# Patient Record
Sex: Female | Born: 1960 | Race: White | Hispanic: No | Marital: Married | Smoking: Never smoker
Health system: Southern US, Community
[De-identification: ages and names within clinical notes are randomized; demographics above are authoritative.]

## PROBLEM LIST (undated history)

## (undated) DIAGNOSIS — J302 Other seasonal allergic rhinitis: Secondary | ICD-10-CM

## (undated) DIAGNOSIS — N951 Menopausal and female climacteric states: Secondary | ICD-10-CM

## (undated) DIAGNOSIS — K219 Gastro-esophageal reflux disease without esophagitis: Secondary | ICD-10-CM

## (undated) DIAGNOSIS — C549 Malignant neoplasm of corpus uteri, unspecified: Secondary | ICD-10-CM

## (undated) DIAGNOSIS — G43909 Migraine, unspecified, not intractable, without status migrainosus: Secondary | ICD-10-CM

## (undated) DIAGNOSIS — I10 Essential (primary) hypertension: Secondary | ICD-10-CM

## (undated) DIAGNOSIS — M1990 Unspecified osteoarthritis, unspecified site: Secondary | ICD-10-CM

## (undated) DIAGNOSIS — D649 Anemia, unspecified: Secondary | ICD-10-CM

## (undated) HISTORY — DX: Menopausal and female climacteric states: N95.1

## (undated) HISTORY — PX: BREAST BIOPSY: SHX20

## (undated) HISTORY — DX: Migraine, unspecified, not intractable, without status migrainosus: G43.909

## (undated) HISTORY — DX: Gastro-esophageal reflux disease without esophagitis: K21.9

## (undated) HISTORY — PX: ABDOMINAL HYSTERECTOMY: SHX81

---
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
## 2010-07-15 ENCOUNTER — Encounter (INDEPENDENT_AMBULATORY_CARE_PROVIDER_SITE_OTHER): Payer: Self-pay | Admitting: *Deleted

## 2010-12-16 NOTE — Letter (Signed)
Summary: Unable to Reach, Consult Scheduled  [facility]  [address]   [place], [place] [address]   Phone: [phone]  Fax: [phone]    07/15/2010  Leah Diaz [address] [place], [place]  [address] March 05, 1961   Dear [name],      At the recommendation of DR Dimas Aguas  we have been asked to schedule you a consult with DR FIELDS for DYSPHAGIA.   Please call our office at [phone].     Thank you,    Diana Eves  [facility] R. Roetta Sessions, M.D.    Jonette Eva, M.D. Lorenza Burton, FNP-BC    Tana Coast, PA-C Phone: [phone]    Fax: [phone]

## 2011-05-05 DIAGNOSIS — N951 Menopausal and female climacteric states: Secondary | ICD-10-CM

## 2011-05-05 DIAGNOSIS — G43909 Migraine, unspecified, not intractable, without status migrainosus: Secondary | ICD-10-CM

## 2011-05-05 HISTORY — DX: Menopausal and female climacteric states: N95.1

## 2011-05-05 HISTORY — DX: Migraine, unspecified, not intractable, without status migrainosus: G43.909

## 2011-05-05 HISTORY — PX: TUBAL LIGATION: SHX77

## 2011-06-03 ENCOUNTER — Encounter (INDEPENDENT_AMBULATORY_CARE_PROVIDER_SITE_OTHER): Payer: Self-pay

## 2011-06-30 ENCOUNTER — Ambulatory Visit (INDEPENDENT_AMBULATORY_CARE_PROVIDER_SITE_OTHER): Payer: Self-pay | Admitting: Internal Medicine

## 2011-08-04 ENCOUNTER — Ambulatory Visit (INDEPENDENT_AMBULATORY_CARE_PROVIDER_SITE_OTHER): Payer: Self-pay | Admitting: Internal Medicine

## 2011-08-05 ENCOUNTER — Encounter (INDEPENDENT_AMBULATORY_CARE_PROVIDER_SITE_OTHER): Payer: Self-pay | Admitting: Internal Medicine

## 2011-08-05 ENCOUNTER — Telehealth (INDEPENDENT_AMBULATORY_CARE_PROVIDER_SITE_OTHER): Payer: Self-pay | Admitting: *Deleted

## 2011-08-05 ENCOUNTER — Ambulatory Visit (INDEPENDENT_AMBULATORY_CARE_PROVIDER_SITE_OTHER): Payer: BC Managed Care – PPO | Admitting: Internal Medicine

## 2011-08-05 VITALS — BP 96/54 | HR 56 | Ht <= 58 in | Wt 185.4 lb

## 2011-08-05 DIAGNOSIS — Z1211 Encounter for screening for malignant neoplasm of colon: Secondary | ICD-10-CM

## 2011-08-05 DIAGNOSIS — R1319 Other dysphagia: Secondary | ICD-10-CM

## 2011-08-05 DIAGNOSIS — R1310 Dysphagia, unspecified: Secondary | ICD-10-CM

## 2011-08-05 DIAGNOSIS — R1314 Dysphagia, pharyngoesophageal phase: Secondary | ICD-10-CM

## 2011-08-05 MED ORDER — PEG-KCL-NACL-NASULF-NA ASC-C 100 G PO SOLR: 1.0000 | Freq: Once | ORAL | Status: DC

## 2011-08-05 NOTE — Progress Notes (Signed)
Subjective:     Patient ID: Leah Diaz, female   DOB: 1961/01/04, 50 y.o.   MRN: [id number]  HPI  Leah Diaz is a 50 yr female referred to our office by Dr. Mora Appl. She c/o dysphagia.  She says chicken is lodging in her esophagus.  When it lodges, it feels like it will bubbling in her esophagus.   She will drink water till the bolus goes down.  Sometimes she will vomit the bolus up.  No symptoms for about a month.  Dysphagia started about 6-9 months.  It feels like it is lodging in her upper esophagus. Appetite is good. No weight loss. No abdominal pain.  She has a BM one a day. She will see blood when she wipes if her hemorrhoids flare up. Her acid reflux is controlled with Prevacid.  If she doesn't take the Prevacid it feels like something is sitting on her chest.    She also is due for a screening colonoscopy Review of Systems  See hpi Current Outpatient Prescriptions  Medication Sig Dispense Refill  . butalbital-aspirin-caffeine-codeine (FIORINAL WITH CODEINE) 50-325-40-30 MG capsule Take 1 capsule by mouth every 4 (four) hours as needed.        . cetirizine (ZYRTEC) 10 MG chewable tablet Chew 10 mg by mouth daily.        . lansoprazole (PREVACID) 15 MG capsule Take 15 mg by mouth daily.        Marland Kitchen topiramate (TOPAMAX) 100 MG tablet Take 100 mg by mouth 2 (two) times daily.         Past Medical History  Diagnosis Date  . GERD (gastroesophageal reflux disease)   . Migraines 05/05/2011  . Menopausal symptoms 05/05/2011  . Multiple allergies    Past Surgical History  Procedure Date  . Tubal ligation 05/05/2011  . Breast biopsy     x 2 benign   History   Social History  . Marital Status: Unknown    Spouse Name: N/A    Number of Children: N/A  . Years of Education: N/A   Occupational History  . Not on file.   Social History Main Topics  . Smoking status: Never Smoker   . Smokeless tobacco: Not on file  . Alcohol Use: No  . Drug Use: No  . Sexually Active: Not on file   Other  Topics Concern  . Not on file   Social History Narrative  . No narrative on file   Family Status  Relation Status Death Age  . Mother Alive     good health  . Father Alive     good health  . Child Alive     good health. 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 Skin warm and dry. Oral mucosa is moist. Natural teeth in good condition. Sclera anicteric, conjunctivae is pink. Thyroid not enlarged. No cervical lymphadenopathy. Lungs clear. Heart regular rate and rhythm.  Abdomen is soft. Bowel sounds are positive. No hepatomegaly. No abdominal masses felt. No tenderness.  No edema to lower extremities. Patient is alert and oriented.     Assessment:    dysphagia, Screening colonoscopy.  Esophageal stricture or web need to be ruled out..  Barrett's  esophagus also in the differential given her hx of acid reflux.    Plan:    EGD/ED/ screening colonoscopy.The risks and benefits such as perforation, bleeding, and infection were reviewed with the patient and is agreeable.

## 2011-08-05 NOTE — Telephone Encounter (Signed)
TCS/EGD/ED sch'd 09/17/11 @ 1:00 (12:00), osmo pill prep instructions given

## 2011-09-03 ENCOUNTER — Other Ambulatory Visit (INDEPENDENT_AMBULATORY_CARE_PROVIDER_SITE_OTHER): Payer: Self-pay | Admitting: *Deleted

## 2011-09-03 DIAGNOSIS — Z1211 Encounter for screening for malignant neoplasm of colon: Secondary | ICD-10-CM

## 2011-09-16 MED ORDER — SODIUM CHLORIDE 0.45 % IV SOLN
Freq: Once | INTRAVENOUS | Status: AC
  Administered 2011-09-17: 12:00:00 via INTRAVENOUS

## 2011-09-17 ENCOUNTER — Encounter (HOSPITAL_COMMUNITY): Payer: Self-pay | Admitting: *Deleted

## 2011-09-17 ENCOUNTER — Other Ambulatory Visit (INDEPENDENT_AMBULATORY_CARE_PROVIDER_SITE_OTHER): Payer: Self-pay | Admitting: Internal Medicine

## 2011-09-17 ENCOUNTER — Encounter (HOSPITAL_COMMUNITY)
Admission: RE | Disposition: A | Discharge status: Home / Self Care | Payer: Self-pay | Source: Ambulatory Visit | Attending: Internal Medicine

## 2011-09-17 ENCOUNTER — Ambulatory Visit (HOSPITAL_COMMUNITY)
Admission: RE | Admit: 2011-09-17 | Discharge: 2011-09-17 | Disposition: A | Discharge status: Home / Self Care | Payer: BC Managed Care – PPO | Source: Ambulatory Visit | Attending: Internal Medicine | Admitting: Internal Medicine

## 2011-09-17 DIAGNOSIS — Z1211 Encounter for screening for malignant neoplasm of colon: Secondary | ICD-10-CM

## 2011-09-17 DIAGNOSIS — K6389 Other specified diseases of intestine: Secondary | ICD-10-CM

## 2011-09-17 DIAGNOSIS — K2980 Duodenitis without bleeding: Secondary | ICD-10-CM

## 2011-09-17 DIAGNOSIS — R1310 Dysphagia, unspecified: Secondary | ICD-10-CM | POA: Insufficient documentation

## 2011-09-17 DIAGNOSIS — D126 Benign neoplasm of colon, unspecified: Secondary | ICD-10-CM | POA: Insufficient documentation

## 2011-09-17 DIAGNOSIS — K222 Esophageal obstruction: Secondary | ICD-10-CM | POA: Insufficient documentation

## 2011-09-17 DIAGNOSIS — R1312 Dysphagia, oropharyngeal phase: Secondary | ICD-10-CM

## 2011-09-17 DIAGNOSIS — K644 Residual hemorrhoidal skin tags: Secondary | ICD-10-CM | POA: Insufficient documentation

## 2011-09-17 DIAGNOSIS — K219 Gastro-esophageal reflux disease without esophagitis: Secondary | ICD-10-CM

## 2011-09-17 HISTORY — DX: Other seasonal allergic rhinitis: J30.2

## 2011-09-17 SURGERY — COLONOSCOPY WITH ESOPHAGOGASTRODUODENOSCOPY (EGD)
Anesthesia: Moderate Sedation

## 2011-09-17 MED ORDER — BUTAMBEN-TETRACAINE-BENZOCAINE 2-2-14 % EX AERO
INHALATION_SPRAY | CUTANEOUS | Status: DC | PRN
  Administered 2011-09-17: 2 via TOPICAL

## 2011-09-17 MED ORDER — MIDAZOLAM HCL 5 MG/5ML IJ SOLN
INTRAMUSCULAR | Status: DC | PRN
  Administered 2011-09-17 (×2): 2 mg via INTRAVENOUS
  Administered 2011-09-17 (×3): 1 mg via INTRAVENOUS
  Administered 2011-09-17: 2 mg via INTRAVENOUS

## 2011-09-17 MED ORDER — MEPERIDINE HCL 50 MG/ML IJ SOLN
INTRAMUSCULAR | Status: DC | PRN
  Administered 2011-09-17 (×2): 25 mg via INTRAVENOUS

## 2011-09-17 MED ORDER — MIDAZOLAM HCL 5 MG/5ML IJ SOLN
INTRAMUSCULAR | Status: AC
  Filled 2011-09-17: qty 10

## 2011-09-17 MED ORDER — STERILE WATER FOR IRRIGATION IR SOLN
Status: DC | PRN
  Administered 2011-09-17: 13:00:00

## 2011-09-17 MED ORDER — MEPERIDINE HCL 50 MG/ML IJ SOLN
INTRAMUSCULAR | Status: AC
  Filled 2011-09-17: qty 1

## 2011-09-17 NOTE — Op Note (Signed)
EGD AND COLONOSCOPY  PROCEDURE REPORT  PATIENT:  Leah Diaz  MR#:  [id number] Birthdate:  [birth year], 50 y.o., female Endoscopist:  Dr. Malissa Hippo, MD Referred By:  Dr. Gillie Manners. Dimas Aguas, M.D.  Procedure Date: 09/17/2011  Procedure:   EGD,  ED & Colonoscopy  Indications:  Patient is 50 year old Caucasian female with chronic GERD was maintained on PPI who presents with intermittent solid food dysphagia of 8-9 months duration. She is also undergoing screening colonoscopy.             Informed Consent: Procedures and risks were reviewed with the patient and informed consent was obtained. Medications:  Demerol 50 mg IV Versed 10 mg IV Cetacaine spray topically for oropharyngeal anesthesia  EGD  Description of procedure:  The endoscope was introduced through the mouth and advanced to the second portion of the duodenum without difficulty or limitations. The mucosal surfaces were surveyed very carefully during advancement of the scope and upon withdrawal.  Findings:  Esophagus:  Mucosa of the proximal middle and distal third was normal. There was noncritical narrowing at GE junction with edema and nodularity to mucosa. This area was biopsied as reviewed below. GEJ:  36 cm Hiatus:  38 cm Stomach:  Stomach was empty and distended very well with insufflation. Folds in the proximal stomach were normal. Examination of mucosa at body, antrum, pyloric channel as well as angularis fundus and cardia was normal. Duodenum:  Patchy edema and erythema to bulbar mucosa. Post bulbar mucosa was normal.  Therapeutic/Diagnostic Maneuvers Performed:  Esophageal dilation was performed by passing 54 French Maloney dilator to full insertion. Esophageal mucosa was reexamined post dilation and there was mucosal disruption at GE junction. Biopsy was taken from this edematous nodular mucosa at GE junction before the endoscope was withdrawn.  COLONOSCOPY Description of procedure:  After a digital rectal exam  was performed, that colonoscope was advanced from the anus through the rectum and colon to the area of the cecum, ileocecal valve and appendiceal orifice. The cecum was deeply intubated. These structures were well-seen and photographed for the record. From the level of the cecum and ileocecal valve, the scope was slowly and cautiously withdrawn. The mucosal surfaces were carefully surveyed utilizing scope tip to flexion to facilitate fold flattening as needed. The scope was pulled down into the rectum where a thorough exam including retroflexion was performed. Is note patient was turned on her right side and better views of appendiceal orifice and cecum were taken but these pictures were lost.  Findings:   Prep excellent. 5 mm polyp at hepatic flexure ablated via cold biopsy. 2- 3 mm polyp at proximal transverse colon ablated via cold biopsy. 3 anal papilla and hemorrhoids below the dentate line.   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 Small sliding hiatal hernia. Bulbar duodenitis Esophagus dilated by passing 54 [place] Maloney dilator. Biopsy  taken from mucosa at GE junction post dilation. 2 small polyps ablated via cold biopsy one from hepatic flexure and the second one from transverse colon. External hemorrhoids and three small anal papillae.    Recommendations:  Standard instructions given. We will check her H. pylori serology today. I will be contacting patient with results of biopsy and blood test.  Theresa Wedel U  09/17/2011 2:05 PM  CC: Dr. Criss Rosales, MD & Dr. Bonnetta Barry ref. provider found

## 2011-09-17 NOTE — H&P (Signed)
Leah Diaz is an 50 y.o. female.   Chief Complaint: Patient is in for EGD, ED and colonoscopy. HPI: Patient is 50 year old Caucasian female was chronic GERD and maintained on PPI was been experiencing intermittent solid food dysphagia for the last 8-9 months. She has most difficulty with chicken. She points to upper sternal/suprasternal area site of bolus obstruction.Marland Kitchen occasionally she has to regurgitate food bolus to get relief. He has good appetite she denies abdominal pain melena or rectal bleeding. She is also undergoing screening colonoscopy. Him history is positive for colorectal carcinoma in maternal uncle who died at age 86.  Past Medical History  Diagnosis Date  . GERD (gastroesophageal reflux disease)   . Migraines 05/05/2011  . Menopausal symptoms 05/05/2011  . Asthma     remote history as a teenager  . Seasonal allergies     Past Surgical History  Procedure Date  . Tubal ligation 05/05/2011  . Breast biopsy     x 2 benign    Family History  Problem Relation Age of Onset  . Colon cancer Maternal Uncle    Social History:  reports that she has never smoked. She does not have any smokeless tobacco history on file. She reports that she does not drink alcohol or use illicit drugs.  Allergies: No Known Allergies  Medications Prior to Admission  Medication Dose Route Frequency Provider Last Rate Last Dose  . 0.45 % sodium chloride infusion   Intravenous Once Malissa Hippo, MD 20 mL/hr at 09/17/11 1212    . meperidine (DEMEROL) 50 MG/ML injection           . midazolam (VERSED) 5 MG/5ML injection            Medications Prior to Admission  Medication Sig Dispense Refill  . butalbital-aspirin-caffeine-codeine (FIORINAL WITH CODEINE) 50-325-40-30 MG capsule Take 1 capsule by mouth every 4 (four) hours as needed. migraines      . cetirizine (ZYRTEC) 10 MG chewable tablet Chew 10 mg by mouth daily. allergies      . lansoprazole (PREVACID) 15 MG capsule Take 15 mg by mouth  daily.        Marland Kitchen topiramate (TOPAMAX) 100 MG tablet Take 100 mg by mouth daily.         No results found for this or any previous visit (from the past 48 hour(s)). No results found.  Review of Systems  Constitutional: Negative for weight loss.  Gastrointestinal: Negative for vomiting, abdominal pain, diarrhea, constipation, blood in stool and melena. Heartburn: heartburn only if he misses her medication.    Blood pressure 148/97, pulse 75, temperature 98.1 F (36.7 C), temperature source Oral, resp. rate 16, height 5\' 8"  (1.727 m), weight 180 lb (81.647 kg), SpO2 100.00%. Physical Exam  Constitutional: She appears well-developed and well-nourished.  HENT:  Mouth/Throat: Oropharynx is clear and moist.  Eyes: Conjunctivae are normal. No scleral icterus.  Neck: No thyromegaly present.  Cardiovascular: Normal rate, regular rhythm and normal heart sounds.   Respiratory: Effort normal and breath sounds normal.  GI: Soft. She exhibits no distension. There is no tenderness.  Musculoskeletal: She exhibits no edema.  Lymphadenopathy:    She has no cervical adenopathy.  Neurological: She is alert.  Skin: Skin is warm and dry.     Assessment/Plan Chronic GERD. Solid food dysphagia EGD possible ED and screening colonoscopy Johnae Friley U 09/17/2011, 1:09 PM

## 2011-09-23 ENCOUNTER — Telehealth (INDEPENDENT_AMBULATORY_CARE_PROVIDER_SITE_OTHER): Payer: Self-pay | Admitting: Internal Medicine

## 2011-09-23 NOTE — Telephone Encounter (Signed)
Would like to know her biopsy results. Please return the call to [phone].

## 2011-09-25 ENCOUNTER — Encounter (INDEPENDENT_AMBULATORY_CARE_PROVIDER_SITE_OTHER): Payer: Self-pay | Admitting: *Deleted

## 2011-09-25 NOTE — Telephone Encounter (Signed)
Patient was called yesterday and nontender with the results with her husband

## 2011-09-25 NOTE — Telephone Encounter (Signed)
Forwarded to Dr.Rehman. 

## 2011-09-26 NOTE — Telephone Encounter (Signed)
I went over the results with patient; she tells me that husband has not told her anything about the results Whim 2 days ago. which I reviewed with him 2 days ago. HP serology cancelled;not sure why; Tammy please find out and order one;

## 2011-09-28 ENCOUNTER — Telehealth (INDEPENDENT_AMBULATORY_CARE_PROVIDER_SITE_OTHER): Payer: Self-pay | Admitting: *Deleted

## 2011-09-28 DIAGNOSIS — R1013 Epigastric pain: Secondary | ICD-10-CM

## 2011-09-28 NOTE — Telephone Encounter (Signed)
Previus order was canceled,per Dr. Karilyn Cota reorder. Order faxed to [facility]

## 2011-09-29 ENCOUNTER — Telehealth (INDEPENDENT_AMBULATORY_CARE_PROVIDER_SITE_OTHER): Payer: Self-pay | Admitting: Internal Medicine

## 2011-09-29 NOTE — Telephone Encounter (Signed)
This is for Dr. Rehman 

## 2011-09-29 NOTE — Telephone Encounter (Signed)
Would like to get her blood test results. Please return the call to [phone].

## 2011-09-30 NOTE — Telephone Encounter (Signed)
H-Pylori ordered and faxed to [facility]. Patient called on home line and given this information.

## 2011-10-01 NOTE — Telephone Encounter (Signed)
Patient was called yesterday and a message was left that a lab order had been faxed to [facility] for the H-Pylori to be done.

## 2011-10-01 NOTE — Telephone Encounter (Signed)
She never had H. pylori serology as ordered. I do not know the reason why it was canceled. She will have this test done when she can

## 2011-10-03 ENCOUNTER — Other Ambulatory Visit (INDEPENDENT_AMBULATORY_CARE_PROVIDER_SITE_OTHER): Payer: Self-pay | Admitting: Internal Medicine

## 2011-10-05 ENCOUNTER — Other Ambulatory Visit (INDEPENDENT_AMBULATORY_CARE_PROVIDER_SITE_OTHER): Payer: Self-pay | Admitting: Internal Medicine

## 2011-10-05 DIAGNOSIS — B9681 Helicobacter pylori [H. pylori] as the cause of diseases classified elsewhere: Secondary | ICD-10-CM

## 2011-10-05 MED ORDER — BIS SUBCIT-METRONID-TETRACYC 140-125-125 MG PO CAPS: 3.0000 | ORAL_CAPSULE | Freq: Three times a day (TID) | ORAL | Status: AC

## 2011-10-06 ENCOUNTER — Telehealth (INDEPENDENT_AMBULATORY_CARE_PROVIDER_SITE_OTHER): Payer: Self-pay | Admitting: Internal Medicine

## 2011-10-06 DIAGNOSIS — B9681 Helicobacter pylori [H. pylori] as the cause of diseases classified elsewhere: Secondary | ICD-10-CM

## 2011-10-06 NOTE — Telephone Encounter (Signed)
Patient went to pick up the medication Dr. Karilyn Cota wanted her to start taking. She can not remember the name due to the price. The medication is going to cost $637. [employer] company will not cover this. Is there anything cheaper the medication could be replaced with? There return phone number is [phone].

## 2011-10-07 MED ORDER — AMOXICILLIN 500 MG PO CAPS: 1000.0000 mg | ORAL_CAPSULE | Freq: Two times a day (BID) | ORAL | Status: AC

## 2011-10-07 MED ORDER — OMEPRAZOLE 20 MG PO CPDR: 20.0000 mg | DELAYED_RELEASE_CAPSULE | Freq: Two times a day (BID) | ORAL | Status: DC

## 2011-10-07 MED ORDER — CLARITHROMYCIN 500 MG PO TABS: 500.0000 mg | ORAL_TABLET | Freq: Two times a day (BID) | ORAL | Status: AC

## 2011-10-07 NOTE — Telephone Encounter (Signed)
Per Delrae Rend she has e-scribed the patient 's pharmacy that she left for [place] to fax it to as she is out of town.

## 2011-10-07 NOTE — Telephone Encounter (Signed)
Leah Diaz , This patient is out of town and is in need of treatment for H-Pylori. Her level is high. The Pylera is very expensive.Would you please call into the Pharmacy that she will tell you, another script? Refer to the list we have of antibotics,pepto,flagyl. Thank You

## 2011-10-07 NOTE — Telephone Encounter (Signed)
Patient call and said she is out of town and to please fax the new rx to [facility] in [place].

## 2011-10-07 NOTE — Telephone Encounter (Signed)
Addended by: Len Blalock on: 10/07/2011 04:55 PM   Modules accepted: Orders

## 2016-09-30 ENCOUNTER — Encounter (INDEPENDENT_AMBULATORY_CARE_PROVIDER_SITE_OTHER): Payer: Self-pay | Admitting: *Deleted

## 2018-01-11 ENCOUNTER — Encounter (INDEPENDENT_AMBULATORY_CARE_PROVIDER_SITE_OTHER): Payer: Self-pay | Admitting: *Deleted

## 2019-07-06 ENCOUNTER — Encounter (INDEPENDENT_AMBULATORY_CARE_PROVIDER_SITE_OTHER): Payer: Self-pay | Admitting: *Deleted

## 2020-11-06 HISTORY — PX: HYSTEROSCOPY: SHX211

## 2021-03-03 ENCOUNTER — Encounter (INDEPENDENT_AMBULATORY_CARE_PROVIDER_SITE_OTHER): Payer: Self-pay | Admitting: *Deleted

## 2021-04-23 ENCOUNTER — Telehealth (INDEPENDENT_AMBULATORY_CARE_PROVIDER_SITE_OTHER): Payer: Self-pay

## 2021-04-23 ENCOUNTER — Encounter (INDEPENDENT_AMBULATORY_CARE_PROVIDER_SITE_OTHER): Payer: Self-pay

## 2021-04-23 ENCOUNTER — Other Ambulatory Visit (INDEPENDENT_AMBULATORY_CARE_PROVIDER_SITE_OTHER): Payer: Self-pay

## 2021-04-23 DIAGNOSIS — Z01812 Encounter for preprocedural laboratory examination: Secondary | ICD-10-CM

## 2021-04-23 DIAGNOSIS — Z1211 Encounter for screening for malignant neoplasm of colon: Secondary | ICD-10-CM

## 2021-04-23 MED ORDER — SUPREP BOWEL PREP KIT 17.5-3.13-1.6 GM/177ML PO SOLN: 1.0000 | ORAL | 0 refills | Status: DC

## 2021-04-23 NOTE — Telephone Encounter (Signed)
Leah Diaz, CMA  

## 2021-04-23 NOTE — Telephone Encounter (Signed)
Referring MD/PCP: [name]  Procedure: Tcs  Reason/Indication:  Screening, Fam Hx of Colon Ca  Has patient had this procedure before?  yes  If so, when, by whom and where?  10 yrs ago  Is there a family history of colon cancer?  yes  Who?  What age when diagnosed?  Maternal Uncle  Is patient diabetic? If yes, Type 1 or Type 2   no      Does patient have prosthetic heart valve or mechanical valve?  no  Do you have a pacemaker/defibrillator?  no  Has patient ever had endocarditis/atrial fibrillation? no  Does patient use oxygen? no  Has patient had joint replacement within last 12 months?  no  Is patient constipated or do they take laxatives? Yes, constipation  Does patient have a history of alcohol/drug use?  no  Have you had a stroke/heart attack last 6 mths? no  Do you take medicine for weight loss?  no  For female patients,: do you still have your menstrual cycle? no  Is patient on blood thinner such as Coumadin, Plavix and/or Aspirin? yes  Medications: asa 81 mg daily, losartan/hctz 100/12.5 mg daily, Zyrtec 10 mg daily, lansoprazole 15 mg daily, Vit D daily, Turmeric Daily, Zinc 50 mg daily, Vit B12 daily, Ibuprofen bid   Allergies: nkda  Medication Adjustment per Dr Laural Golden: Hold asa 81 mg 2 days prior  Procedure date & time: 05/15/21 8:05

## 2021-05-14 ENCOUNTER — Telehealth: Payer: Self-pay | Admitting: *Deleted

## 2021-05-14 NOTE — Telephone Encounter (Signed)
Spoke with the patient and scheduled a new patient appt with Dr Denman George on 7/5 at 10:30 am; with an arrival time of 10 am. Patient given the address and phone number for the clinic. Patient also given the policy for mask and visitors.

## 2021-05-16 ENCOUNTER — Encounter: Payer: Self-pay | Admitting: Gynecologic Oncology

## 2021-05-20 ENCOUNTER — Inpatient Hospital Stay: Payer: Managed Care, Other (non HMO) | Attending: Gynecologic Oncology | Admitting: Gynecologic Oncology

## 2021-05-20 ENCOUNTER — Encounter: Payer: Self-pay | Admitting: Gynecologic Oncology

## 2021-05-20 ENCOUNTER — Telehealth: Payer: Self-pay

## 2021-05-20 ENCOUNTER — Inpatient Hospital Stay: Payer: Managed Care, Other (non HMO)

## 2021-05-20 ENCOUNTER — Inpatient Hospital Stay (HOSPITAL_BASED_OUTPATIENT_CLINIC_OR_DEPARTMENT_OTHER): Payer: Managed Care, Other (non HMO) | Admitting: Gynecologic Oncology

## 2021-05-20 ENCOUNTER — Other Ambulatory Visit: Payer: Self-pay

## 2021-05-20 VITALS — BP 145/71 | HR 79 | Temp 98.5°F | Resp 18 | Ht 68.0 in | Wt 209.0 lb

## 2021-05-20 DIAGNOSIS — Z7982 Long term (current) use of aspirin: Secondary | ICD-10-CM | POA: Insufficient documentation

## 2021-05-20 DIAGNOSIS — I10 Essential (primary) hypertension: Secondary | ICD-10-CM | POA: Diagnosis not present

## 2021-05-20 DIAGNOSIS — E669 Obesity, unspecified: Secondary | ICD-10-CM | POA: Insufficient documentation

## 2021-05-20 DIAGNOSIS — F40240 Claustrophobia: Secondary | ICD-10-CM | POA: Diagnosis not present

## 2021-05-20 DIAGNOSIS — N939 Abnormal uterine and vaginal bleeding, unspecified: Secondary | ICD-10-CM | POA: Diagnosis not present

## 2021-05-20 DIAGNOSIS — N858 Other specified noninflammatory disorders of uterus: Secondary | ICD-10-CM

## 2021-05-20 DIAGNOSIS — D390 Neoplasm of uncertain behavior of uterus: Secondary | ICD-10-CM | POA: Diagnosis not present

## 2021-05-20 DIAGNOSIS — Z79899 Other long term (current) drug therapy: Secondary | ICD-10-CM | POA: Diagnosis not present

## 2021-05-20 DIAGNOSIS — K219 Gastro-esophageal reflux disease without esophagitis: Secondary | ICD-10-CM | POA: Insufficient documentation

## 2021-05-20 DIAGNOSIS — Z6832 Body mass index (BMI) 32.0-32.9, adult: Secondary | ICD-10-CM | POA: Insufficient documentation

## 2021-05-20 LAB — COMPREHENSIVE METABOLIC PANEL
ALT: 37 U/L (ref 0–44)
AST: 24 U/L (ref 15–41)
Albumin: 4 g/dL (ref 3.5–5.0)
Alkaline Phosphatase: 72 U/L (ref 38–126)
Anion gap: 9 (ref 5–15)
BUN: 10 mg/dL (ref 8–23)
CO2: 26 mmol/L (ref 22–32)
Calcium: 9.8 mg/dL (ref 8.9–10.3)
Chloride: 104 mmol/L (ref 98–111)
Creatinine, Ser: 0.75 mg/dL (ref 0.44–1.00)
GFR, Estimated: >60 mL/min (ref >60)
Glucose, Bld: 113 mg/dL — ABNORMAL HIGH (ref 70–99)
Potassium: 3.9 mmol/L (ref 3.5–5.1)
Sodium: 139 mmol/L (ref 135–145)
Total Bilirubin: 0.4 mg/dL (ref 0.3–1.2)
Total Protein: 7.1 g/dL (ref 6.5–8.1)

## 2021-05-20 LAB — APTT: aPTT: 27 seconds (ref 24–36)

## 2021-05-20 LAB — PROTIME-INR
INR: 1 (ref 0.8–1.2)
Prothrombin Time: 12.8 seconds (ref 11.4–15.2)

## 2021-05-20 MED ORDER — SENNOSIDES-DOCUSATE SODIUM 8.6-50 MG PO TABS: 2.0000 | ORAL_TABLET | Freq: Every day | ORAL | 0 refills | Status: DC

## 2021-05-20 MED ORDER — TRAMADOL HCL 50 MG PO TABS: 50.0000 mg | ORAL_TABLET | Freq: Four times a day (QID) | ORAL | 0 refills | Status: DC | PRN

## 2021-05-20 MED ORDER — LORAZEPAM 0.5 MG PO TABS: 0.5000 mg | ORAL_TABLET | Freq: Once | ORAL | 0 refills | Status: AC

## 2021-05-20 MED ORDER — IBUPROFEN 800 MG PO TABS: 800.0000 mg | ORAL_TABLET | Freq: Three times a day (TID) | ORAL | 0 refills | Status: DC | PRN

## 2021-05-20 NOTE — Progress Notes (Signed)
Consult Note: Gyn-Onc  Consult was requested by Dr. Riddick for the evaluation of Leah Diaz 60 y.o. female  CC:  Chief Complaint  Patient presents with   Uterine mass    Assessment/Plan:  Ms. Leah Diaz  is a 60 y.o.  year old with a bleeding uterine fundal mass in the setting of normal endometrial curettings.  I explained to the patient that there was a possibility that this represented a malignancy that was subendothelial, or alternatively a degenerating benign process like fibroid.  I am recommending an MRI of the pelvis with and without contrast to better characterize the location and features of this uterine wall mass.  I am also recommending definitive management with robotic assisted total hysterectomy and BSO with possible staging (ICG dye will be injected at the commencement of surgery to facilitate the possibility of sentinel node biopsy should an endometrial cancer be identified on frozen section).  Based on my physical examination I feel that the patient is a good candidate for minimally invasive approach with vaginal specimen delivery.  However explained to the patient that mini laparotomy may be necessary in order to achieve an intact specimen delivery, if vaginal retrieval if is not possible.  I explained the potential for surgical complications to the patient including  bleeding, infection, damage to internal organs (such as bladder,ureters, bowels), blood clot, reoperation and rehospitalization. I explained that she is increased risk for potential bleeding complications given her history of known liver dysfunction (we will check coags preoperatively) and because of her aspirin use (I counseled the patient to stop taking aspirin today and not restart until after her surgery).   HPI: Ms Leah Diaz is a 60 year old P2 who was seen in consultation at the request of Dr Riddick for evaluation of a uterine mass.  The patient began having postmenopausal bleeding  in the week before Thanksgiving in November 2021.  She return to see her established OB/GYN office for a problem related visit for this new bleeding symptom.  She saw them in the week after Thanksgiving and at that time pelvic examination revealed a cervical polyp which was removed and found to be benign.  She was also scheduled to have a transvaginal ultrasound scan which was performed on October 24, 2020.  This revealed a uterus measuring 8.5 x 6.5 x 6.7 cm.  The endometrium was not definitively visualized.  There is an area of hyper echogenicity seen measuring 18 mm that could represent thickened endometrial complex apart of a heterogeneous central uterine mass that was seen.  A large heterogeneous masslike area was seen in the upper uterine segment centrally measuring 5.3 x 5.7 x 4.8 cm.  He contained internal blood flow on color Doppler.  They could represent either a degenerating myoma or an endometrial neoplasm.  She was taken to the operating room for hysteroscopy and D&C on 11/06/2020.  At that time Dr. Riddick reported that she did not see an endometrial lesion.  Curettings revealed scant fragments of benign endometrial mucosa and extensive hemorrhage but no invasive malignancy or dysplasia was identified.  Of note a Pap smear from December 2020 had been normal with no high risk HPV detected.  The patient had normal post D&C spotting until January 2022 then no further bleeding until May 2022 when the bleeding recommenced again.  At this time she return to see Dr. Riddick.  At that time Dr. Riddick ordered a second ultrasound scan to follow-up on the mass that had been seen previously.    This time on ultrasound on 05/08/2021 the uterus measured 11 x 7.7 x 7.5 cm.  Again a heterogeneous hypervascular mass was seen within the uterine corpus and fundus.  It measured 6.1 x 6.1 x 6.1 cm which had increased from its previous dimensions of 5.3 x 5.7 x 4.8 cm.  The endometrium was difficult to visualize.  The  ovaries were not seen.  Her medical history is most significant for obesity with a BMI of 32 kg per metered squared, she also has hypertension.  She takes aspirin for heart health.  Her surgical history is most significant for tubal ligation.  Her gynecologic history is remarkable for SVD x2.  Her family cancer history is a father with rectal cancer, a maternal uncle with colon cancer.  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   Current Meds:  Outpatient Encounter Medications as of 05/20/2021  Medication Sig   acetaminophen (TYLENOL) 500 MG tablet Take by mouth.   aspirin 81 MG EC tablet Take by mouth every other day.   cetirizine (ZYRTEC) 10 MG chewable tablet Chew 10 mg by mouth daily. allergies   fluticasone (FLONASE) 50 MCG/ACT nasal spray Place into both nostrils as needed for allergies or rhinitis.   ibuprofen (ADVIL) 800 MG tablet Take 1 tablet (800 mg total) by mouth every 8 (eight) hours as needed for moderate pain. For AFTER surgery only   Ibuprofen 200 MG CAPS Take 200 mg by mouth in the morning and at bedtime.   lansoprazole (PREVACID) 15 MG capsule Take 15 mg by mouth daily.     LORazepam (ATIVAN) 0.5 MG tablet Take 1 tablet (0.5 mg total) by mouth once for 1 dose. Take one hour prior to MRI, do not take and drive   losartan-hydrochlorothiazide (HYZAAR) 100-25 MG tablet Take 1 tablet by mouth daily.   OVER THE COUNTER MEDICATION Take by mouth daily. Vitamin D   senna-docusate (SENOKOT-S) 8.6-50 MG tablet Take 2 tablets by mouth at bedtime. For AFTER surgery, do not take if having diarrhea   traMADol (ULTRAM) 50 MG tablet Take 1 tablet (50 mg total) by mouth every 6 (six) hours as needed for severe pain. For AFTER surgery only, do not take and drive   triamcinolone cream (KENALOG) 0.1 % Apply 1 application topically 2 (two) times daily.   TURMERIC PO Take by mouth daily.   butalbital-aspirin-caffeine-codeine (FIORINAL WITH CODEINE) 50-325-40-30 MG capsule Take 1  capsule by mouth every 4 (four) hours as needed. migraines (Patient not taking: Reported on 05/16/2021)   Na Sulfate-K Sulfate-Mg Sulf (SUPREP BOWEL PREP KIT) 17.5-3.13-1.6 GM/177ML SOLN Take 1 kit by mouth as directed. (Patient not taking: Reported on 05/16/2021)   omeprazole (PRILOSEC) 20 MG capsule Take 1 capsule (20 mg total) by mouth 2 (two) times daily.   peg 3350 powder (MOVIPREP) 100 G SOLR Take 1 kit (100 g total) by mouth once. (Patient not taking: Reported on 05/16/2021)   topiramate (TOPAMAX) 100 MG tablet Take 100 mg by mouth daily.  (Patient not taking: Reported on 05/16/2021)   No facility-administered encounter medications on file as of 05/20/2021.    Allergy: No Known Allergies  Social Hx:   Social History   Socioeconomic History   Marital status: Unknown    Spouse name: Not on file   Number of children: Not on file   Years of education: Not on file   Highest education level: Not on file  Occupational History   Not on file  Tobacco Use     Smoking status: Never   Smokeless tobacco: Never  Vaping Use   Vaping Use: Never used  Substance and Sexual Activity   Alcohol use: No    Comment: Occasional   Drug use: No   Sexual activity: Yes  Other Topics Concern   Not on file  Social History Narrative   Not on file   Social Determinants of Health   Financial Resource Strain: Not on file  Food Insecurity: Not on file  Transportation Needs: Not on file  Physical Activity: Not on file  Stress: Not on file  Social Connections: Not on file  Intimate Partner Violence: Not on file    Past Surgical Hx:  Past Surgical History:  Procedure Laterality Date   BREAST BIOPSY     x 2 benign   HYSTEROSCOPY  11/06/2020   TUBAL LIGATION  05/05/2011    Past Medical Hx:  Past Medical History:  Diagnosis Date   Asthma    remote history as a teenager   GERD (gastroesophageal reflux disease)    Menopausal symptoms 05/05/2011   Migraines 05/05/2011   Seasonal allergies     Past  Gynecological History: SVD x3 No LMP recorded. Patient is postmenopausal.  Family Hx:  Family History  Problem Relation Age of Onset   Colon cancer Maternal Uncle    Pancreatic cancer Maternal Grandmother     Review of Systems:  Constitutional  Feels well,    ENT Normal appearing ears and nares bilaterally Skin/Breast  No rash, sores, jaundice, itching, dryness Cardiovascular  No chest pain, shortness of breath, or edema  Pulmonary  No cough or wheeze.  Gastro Intestinal  No nausea, vomitting, or diarrhoea. No bright red blood per rectum, no abdominal pain, change in bowel movement, or constipation.  Genito Urinary  No frequency, urgency, dysuria, + postmenopausal bleeding Musculo Skeletal  No myalgia, arthralgia, joint swelling or pain  Neurologic  No weakness, numbness, change in gait,  Psychology  No depression, anxiety, insomnia.   Vitals:  Blood pressure (!) 145/71, pulse 79, temperature 98.5 F (36.9 C), temperature source Oral, resp. rate 18, height 5' 8" (1.727 m), weight 209 lb (94.8 kg), SpO2 100 %.  Physical Exam: WD in NAD Neck  Supple NROM, without any enlargements.  Lymph Node Survey No cervical supraclavicular or inguinal adenopathy Cardiovascular  Well perfused peripheries Lungs  No increased WOB Skin  No rash/lesions/breakdown  Psychiatry  Alert and oriented to person, place, and time  Abdomen  Normoactive bowel sounds, abdomen soft, non-tender and obese without evidence of hernia.  Back No CVA tenderness Genito Urinary  Vulva/vagina: Normal external female genitalia.  No lesions. No discharge or bleeding.  Bladder/urethra:  No lesions or masses, well supported bladder  Vagina: normal  Cervix: Normal appearing, no lesions.  Uterus:  Slightly bulky, mobile, no parametrial involvement or nodularity.  Adnexa: no palpable masses. Rectal  Good tone, no masses no cul de sac nodularity.  Extremities  No bilateral cyanosis, clubbing or  edema.  60 minutes of total time was spent for this patient encounter, including preparation, face-to-face counseling with the patient and coordination of care, review of imaging (results and images), communication with the referring provider and documentation of the encounter.   Luka Stohr C Tyrece Vanterpool, MD  05/20/2021, 11:47 AM     

## 2021-05-20 NOTE — H&P (View-Only) (Signed)
Consult Note: Gyn-Onc  Consult was requested by Dr. Addison Bailey for the evaluation of Leah Diaz 59 y.o. female  CC:  Chief Complaint  Patient presents with   Uterine mass    Assessment/Plan:  Ms. Leah Diaz  is a 61 y.o.  year old with a bleeding uterine fundal mass in the setting of normal endometrial curettings.  I explained to the patient that there was a possibility that this represented a malignancy that was subendothelial, or alternatively a degenerating benign process like fibroid.  I am recommending an MRI of the pelvis with and without contrast to better characterize the location and features of this uterine wall mass.  I am also recommending definitive management with robotic assisted total hysterectomy and BSO with possible staging (ICG dye will be injected at the commencement of surgery to facilitate the possibility of sentinel node biopsy should an endometrial cancer be identified on frozen section).  Based on my physical examination I feel that the patient is a good candidate for minimally invasive approach with vaginal specimen delivery.  However explained to the patient that mini laparotomy may be necessary in order to achieve an intact specimen delivery, if vaginal retrieval if is not possible.  I explained the potential for surgical complications to the patient including  bleeding, infection, damage to internal organs (such as bladder,ureters, bowels), blood clot, reoperation and rehospitalization. I explained that she is increased risk for potential bleeding complications given her history of known liver dysfunction (we will check coags preoperatively) and because of her aspirin use (I counseled the patient to stop taking aspirin today and not restart until after her surgery).   HPI: Ms Leah Diaz is a 60 year old P2 who was seen in consultation at the request of Dr Addison Bailey for evaluation of a uterine mass.  The patient began having postmenopausal bleeding  in the week before Thanksgiving in November 2021.  She return to see her established OB/GYN office for a problem related visit for this new bleeding symptom.  She saw them in the week after Thanksgiving and at that time pelvic examination revealed a cervical polyp which was removed and found to be benign.  She was also scheduled to have a transvaginal ultrasound scan which was performed on October 24, 2020.  This revealed a uterus measuring 8.5 x 6.5 x 6.7 cm.  The endometrium was not definitively visualized.  There is an area of hyper echogenicity seen measuring 18 mm that could represent thickened endometrial complex apart of a heterogeneous central uterine mass that was seen.  A large heterogeneous masslike area was seen in the upper uterine segment centrally measuring 5.3 x 5.7 x 4.8 cm.  He contained internal blood flow on color Doppler.  They could represent either a degenerating myoma or an endometrial neoplasm.  She was taken to the operating room for hysteroscopy and D&C on 11/06/2020.  At that time Dr. Addison Bailey reported that she did not see an endometrial lesion.  Curettings revealed scant fragments of benign endometrial mucosa and extensive hemorrhage but no invasive malignancy or dysplasia was identified.  Of note a Pap smear from December 2020 had been normal with no high risk HPV detected.  The patient had normal post D&C spotting until January 2022 then no further bleeding until May 2022 when the bleeding recommenced again.  At this time she return to see Dr. Addison Bailey.  At that time Dr. Addison Bailey ordered a second ultrasound scan to follow-up on the mass that had been seen previously.  This time on ultrasound on 05/08/2021 the uterus measured 11 x 7.7 x 7.5 cm.  Again a heterogeneous hypervascular mass was seen within the uterine corpus and fundus.  It measured 6.1 x 6.1 x 6.1 cm which had increased from its previous dimensions of 5.3 x 5.7 x 4.8 cm.  The endometrium was difficult to visualize.  The  ovaries were not seen.  Her medical history is most significant for obesity with a BMI of 32 kg per metered squared, she also has hypertension.  She takes aspirin for heart health.  Her surgical history is most significant for tubal ligation.  Her gynecologic history is remarkable for SVD x2.  Her family cancer history is a father with rectal cancer, a maternal uncle with colon cancer.  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   Current Meds:  Outpatient Encounter Medications as of 05/20/2021  Medication Sig   acetaminophen (TYLENOL) 500 MG tablet Take by mouth.   aspirin 81 MG EC tablet Take by mouth every other day.   cetirizine (ZYRTEC) 10 MG chewable tablet Chew 10 mg by mouth daily. allergies   fluticasone (FLONASE) 50 MCG/ACT nasal spray Place into both nostrils as needed for allergies or rhinitis.   ibuprofen (ADVIL) 800 MG tablet Take 1 tablet (800 mg total) by mouth every 8 (eight) hours as needed for moderate pain. For AFTER surgery only   Ibuprofen 200 MG CAPS Take 200 mg by mouth in the morning and at bedtime.   lansoprazole (PREVACID) 15 MG capsule Take 15 mg by mouth daily.     LORazepam (ATIVAN) 0.5 MG tablet Take 1 tablet (0.5 mg total) by mouth once for 1 dose. Take one hour prior to MRI, do not take and drive   losartan-hydrochlorothiazide (HYZAAR) 100-25 MG tablet Take 1 tablet by mouth daily.   OVER THE COUNTER MEDICATION Take by mouth daily. Vitamin D   senna-docusate (SENOKOT-S) 8.6-50 MG tablet Take 2 tablets by mouth at bedtime. For AFTER surgery, do not take if having diarrhea   traMADol (ULTRAM) 50 MG tablet Take 1 tablet (50 mg total) by mouth every 6 (six) hours as needed for severe pain. For AFTER surgery only, do not take and drive   triamcinolone cream (KENALOG) 0.1 % Apply 1 application topically 2 (two) times daily.   TURMERIC PO Take by mouth daily.   butalbital-aspirin-caffeine-codeine (FIORINAL WITH CODEINE) 50-325-40-30 MG capsule Take 1  capsule by mouth every 4 (four) hours as needed. migraines (Patient not taking: Reported on 05/16/2021)   Na Sulfate-K Sulfate-Mg Sulf (SUPREP BOWEL PREP KIT) 17.5-3.13-1.6 GM/177ML SOLN Take 1 kit by mouth as directed. (Patient not taking: Reported on 05/16/2021)   omeprazole (PRILOSEC) 20 MG capsule Take 1 capsule (20 mg total) by mouth 2 (two) times daily.   peg 3350 powder (MOVIPREP) 100 G SOLR Take 1 kit (100 g total) by mouth once. (Patient not taking: Reported on 05/16/2021)   topiramate (TOPAMAX) 100 MG tablet Take 100 mg by mouth daily.  (Patient not taking: Reported on 05/16/2021)   No facility-administered encounter medications on file as of 05/20/2021.    Allergy: No Known Allergies  Social Hx:   Social History   Socioeconomic History   Marital status: Unknown    Spouse name: Not on file   Number of children: Not on file   Years of education: Not on file   Highest education level: Not on file  Occupational History   Not on file  Tobacco Use  Smoking status: Never   Smokeless tobacco: Never  Vaping Use   Vaping Use: Never used  Substance and Sexual Activity   Alcohol use: No    Comment: Occasional   Drug use: No   Sexual activity: Yes  Other Topics Concern   Not on file  Social History Narrative   Not on file   Social Determinants of Health   Financial Resource Strain: Not on file  Food Insecurity: Not on file  Transportation Needs: Not on file  Physical Activity: Not on file  Stress: Not on file  Social Connections: Not on file  Intimate Partner Violence: Not on file    Past Surgical Hx:  Past Surgical History:  Procedure Laterality Date   BREAST BIOPSY     x 2 benign   HYSTEROSCOPY  11/06/2020   TUBAL LIGATION  05/05/2011    Past Medical Hx:  Past Medical History:  Diagnosis Date   Asthma    remote history as a teenager   GERD (gastroesophageal reflux disease)    Menopausal symptoms 05/05/2011   Migraines 05/05/2011   Seasonal allergies     Past  Gynecological History: SVD x3 No LMP recorded. Patient is postmenopausal.  Family Hx:  Family History  Problem Relation Age of Onset   Colon cancer Maternal Uncle    Pancreatic cancer Maternal Grandmother     Review of Systems:  Constitutional  Feels well,    ENT Normal appearing ears and nares bilaterally Skin/Breast  No rash, sores, jaundice, itching, dryness Cardiovascular  No chest pain, shortness of breath, or edema  Pulmonary  No cough or wheeze.  Gastro Intestinal  No nausea, vomitting, or diarrhoea. No bright red blood per rectum, no abdominal pain, change in bowel movement, or constipation.  Genito Urinary  No frequency, urgency, dysuria, + postmenopausal bleeding Musculo Skeletal  No myalgia, arthralgia, joint swelling or pain  Neurologic  No weakness, numbness, change in gait,  Psychology  No depression, anxiety, insomnia.   Vitals:  Blood pressure (!) 145/71, pulse 79, temperature 98.5 F (36.9 C), temperature source Oral, resp. rate 18, height _0  (1.727 m), weight 209 lb (94.8 kg), SpO2 100 %.  Physical Exam: WD in NAD Neck  Supple NROM, without any enlargements.  Lymph Node Survey No cervical supraclavicular or inguinal adenopathy Cardiovascular  Well perfused peripheries Lungs  No increased WOB Skin  No rash/lesions/breakdown  Psychiatry  Alert and oriented to person, place, and time  Abdomen  Normoactive bowel sounds, abdomen soft, non-tender and obese without evidence of hernia.  Back No CVA tenderness Genito Urinary  Vulva/vagina: Normal external female genitalia.  No lesions. No discharge or bleeding.  Bladder/urethra:  No lesions or masses, well supported bladder  Vagina: normal  Cervix: Normal appearing, no lesions.  Uterus:  Slightly bulky, mobile, no parametrial involvement or nodularity.  Adnexa: no palpable masses. Rectal  Good tone, no masses no cul de sac nodularity.  Extremities  No bilateral cyanosis, clubbing or  edema.  60 minutes of total time was spent for this patient encounter, including preparation, face-to-face counseling with the patient and coordination of care, review of imaging (results and images), communication with the referring provider and documentation of the encounter.   Thereasa Solo, MD  05/20/2021, 11:47 AM

## 2021-05-20 NOTE — Patient Instructions (Addendum)
Preparing for your Surgery  Plan for surgery on June 03, 2021 with Dr. Everitt Amber at [place] will be scheduled for a robotic assisted total laparoscopic hysterectomy (removal of the uterus and cervix), bilateral salpingo-oophorectomy (removal of both ovaries and fallopian tubes), possible sentinel lymph node biopsy.  We will check a metabolic panel today and contact you with the results. You will need to have a MRI prior to your surgery and we will contact you with the results. We have prescribed an ativan tablet to take one hour prior to your MRI. You will need to have someone drive you to and from since the ativan may cause drowsiness.  Pre-operative Testing -You will receive a phone call from presurgical testing at [facility] to arrange for a pre-operative appointment and lab work.  -Bring your insurance card, copy of an advanced directive if applicable, medication list  -At that visit, you will be asked to sign a consent for a possible blood transfusion in case a transfusion becomes necessary during surgery.  The need for a blood transfusion is rare but having consent is a necessary part of your care.     -You should not be taking blood thinners or aspirin at least ten days prior to surgery unless instructed by your surgeon. STOP YOUR ASPIRIN AND TUMERIC NOW.  -Do not take supplements such as fish oil (omega 3), red yeast rice, turmeric before your surgery. You want to avoid medications with aspirin in them including headache powders such as BC or Goody's), Excedrin migraine.  Day Before Surgery at [place] will be asked to take in a light diet the day before surgery. You will be advised you can have clear liquids up until 3 hours before your surgery.    Eat a light diet the day before surgery.  Examples including soups, broths, toast, yogurt, mashed potatoes.  AVOID GAS PRODUCING FOODS. Things to avoid include carbonated beverages (fizzy beverages, sodas), raw  fruits and raw vegetables (uncooked), or beans.   If your bowels are filled with gas, your surgeon will have difficulty visualizing your pelvic organs which increases your surgical risks.  Your role in recovery Your role is to become active as soon as directed by your doctor, while still giving yourself time to heal.  Rest when you feel tired. You will be asked to do the following in order to speed your recovery:  - Cough and breathe deeply. This helps to clear and expand your lungs and can prevent pneumonia after surgery.  - [place]. Do mild physical activity. Walking or moving your legs help your circulation and body functions return to normal. Do not try to get up or walk alone the first time after surgery.   -If you develop swelling on one leg or the other, pain in the back of your leg, redness/warmth in one of your legs, please call the office or go to the Emergency Room to have a doppler to rule out a blood clot. For shortness of breath, chest pain-seek care in the Emergency Room as soon as possible. - Actively manage your pain. Managing your pain lets you move in comfort. We will ask you to rate your pain on a scale of zero to 10. It is your responsibility to tell your doctor or nurse where and how much you hurt so your pain can be treated.  Special Considerations -If you are diabetic, you may be placed on insulin after surgery to have  closer control over your blood sugars to promote healing and recovery.  This does not mean that you will be discharged on insulin.  If applicable, your oral antidiabetics will be resumed when you are tolerating a solid diet.  -Your final pathology results from surgery should be available around one week after surgery and the results will be relayed to you when available.  -Dr. Lahoma Crocker is the surgeon that assists your GYN Oncologist with surgery.  If you end up staying the night, the next day after your surgery you will either  see Dr. Denman George, Dr. Berline Lopes, or Dr. Lahoma Crocker.  -FMLA forms can be faxed to [phone] and please allow 5-7 business days for completion.  Pain Management After Surgery -You have been prescribed your pain medication and bowel regimen medications before surgery so that you can have these available when you are discharged from the hospital. The pain medication is for use ONLY AFTER surgery and a new prescription will not be given.   -Make sure that you have Tylenol and Ibuprofen at home to use on a regular basis after surgery for pain control. We recommend alternating the medications every hour to six hours since they work differently and are processed in the body differently for pain relief.  -Review the attached handout on narcotic use and their risks and side effects.   Bowel Regimen -You have been prescribed Sennakot-S to take nightly to prevent constipation especially if you are taking the narcotic pain medication intermittently.  It is important to prevent constipation and drink adequate amounts of liquids. You can stop taking this medication when you are not taking pain medication and you are back on your normal bowel routine.  Risks of Surgery Risks of surgery are low but include bleeding, infection, damage to surrounding structures, re-operation, blood clots, and very rarely death.   Blood Transfusion Information (For the consent to be signed before surgery)  We will be checking your blood type before surgery so in case of emergencies, we will know what type of blood you would need.                                            WHAT IS A BLOOD TRANSFUSION?  A transfusion is the replacement of blood or some of its parts. Blood is made up of multiple cells which provide different functions. Red blood cells carry oxygen and are used for blood loss replacement. White blood cells fight against infection. Platelets control bleeding. Plasma helps clot blood. Other blood products  are available for specialized needs, such as hemophilia or other clotting disorders. BEFORE THE TRANSFUSION  Who gives blood for transfusions?  You may be able to donate blood to be used at a later date on yourself (autologous donation). Relatives can be asked to donate blood. This is generally not any safer than if you have received blood from a stranger. The same precautions are taken to ensure safety when a relative's blood is donated. Healthy volunteers who are fully evaluated to make sure their blood is safe. This is blood bank blood. Transfusion therapy is the safest it has ever been in the practice of medicine. Before blood is taken from a donor, a complete history is taken to make sure that person has no history of diseases nor engages in risky social behavior (examples are intravenous drug use or sexual activity with multiple  partners). The donor's travel history is screened to minimize risk of transmitting infections, such as malaria. The donated blood is tested for signs of infectious diseases, such as HIV and hepatitis. The blood is then tested to be sure it is compatible with you in order to minimize the chance of a transfusion reaction. If you or a relative donates blood, this is often done in anticipation of surgery and is not appropriate for emergency situations. It takes many days to process the donated blood. RISKS AND COMPLICATIONS Although transfusion therapy is very safe and saves many lives, the main dangers of transfusion include:  Getting an infectious disease. Developing a transfusion reaction. This is an allergic reaction to something in the blood you were given. Every precaution is taken to prevent this. The decision to have a blood transfusion has been considered carefully by your caregiver before blood is given. Blood is not given unless the benefits outweigh the risks.  AFTER SURGERY INSTRUCTIONS  Return to work: 4 weeks if applicable  Activity: 1. Be up and out of the  bed during the day.  Take a nap if needed.  You may walk up steps but be careful and use the hand rail.  Stair climbing will tire you more than you think, you may need to stop part way and rest.   2. No lifting or straining for 6 weeks over 10 pounds. No pushing, pulling, straining for 6 weeks.  3. No driving for 1 week(s).  Do not drive if you are taking narcotic pain medicine and make sure that your reaction time has returned.   4. You can shower as soon as the next day after surgery. Shower daily.  Use your regular soap and water (not directly on the incision) and pat your incision(s) dry afterwards; don't rub.  No tub baths or submerging your body in water until cleared by your surgeon. If you have the soap that was given to you by pre-surgical testing that was used before surgery, you do not need to use it afterwards because this can irritate your incisions.   5. No sexual activity and nothing in the vagina for 8 weeks.  6. You may experience a small amount of clear drainage from your incisions, which is normal.  If the drainage persists, increases, or changes color please call the office.  7. Do not use creams, lotions, or ointments such as neosporin on your incisions after surgery until advised by your surgeon because they can cause removal of the dermabond glue on your incisions.    8. You may experience vaginal spotting after surgery or around the 6-8 week mark from surgery when the stitches at the top of the vagina begin to dissolve.  The spotting is normal but if you experience heavy bleeding, call our office.  9. Take Tylenol or ibuprofen first for pain and only use narcotic pain medication for severe pain not relieved by the Tylenol or Ibuprofen.  Monitor your Tylenol intake to a max of 4,000 mg in a 24 hour period. You can alternate these medications after surgery.  Diet: 1. Low sodium Heart Healthy Diet is recommended but you are cleared to resume your normal (before surgery) diet  after your procedure.  2. It is safe to use a laxative, such as Miralax or Colace, if you have difficulty moving your bowels. You have been prescribed Sennakot at bedtime every evening to keep bowel movements regular and to prevent constipation.    Wound Care: 1. Keep clean and  dry.  Shower daily.  Reasons to call the Doctor: Fever - Oral temperature greater than 100.4 degrees Fahrenheit Foul-smelling vaginal discharge Difficulty urinating Nausea and vomiting Increased pain at the site of the incision that is unrelieved with pain medicine. Difficulty breathing with or without chest pain New calf pain especially if only on one side Sudden, continuing increased vaginal bleeding with or without clots.   Contacts: For questions or concerns you should contact:  Dr. Everitt Amber at [phone]  Joylene John, NP at [phone]  After Hours: call [phone] and have the GYN Oncologist paged/contacted (after 5 pm or on the weekends).  Messages sent via mychart are for non-urgent matters and are not responded to after hours so for urgent needs, please call the after hours number.

## 2021-05-20 NOTE — Telephone Encounter (Signed)
Told Leah Diaz that her liver enzymes were normal today and she can use tylenol per Melissa Cross,NP. Pt verbalized understanding.

## 2021-05-21 NOTE — Patient Instructions (Signed)
Preparing for your Surgery   Plan for surgery on June 03, 2021 with Dr. Everitt Amber at [place] will be scheduled for a robotic assisted total laparoscopic hysterectomy (removal of the uterus and cervix), bilateral salpingo-oophorectomy (removal of both ovaries and fallopian tubes), possible sentinel lymph node biopsy.   We will check a metabolic panel today and contact you with the results. You will need to have a MRI prior to your surgery and we will contact you with the results. We have prescribed an ativan tablet to take one hour prior to your MRI. You will need to have someone drive you to and from since the ativan may cause drowsiness.   Pre-operative Testing -You will receive a phone call from presurgical testing at [facility] to arrange for a pre-operative appointment and lab work.   -Bring your insurance card, copy of an advanced directive if applicable, medication list   -At that visit, you will be asked to sign a consent for a possible blood transfusion in case a transfusion becomes necessary during surgery.  The need for a blood transfusion is rare but having consent is a necessary part of your care.      -You should not be taking blood thinners or aspirin at least ten days prior to surgery unless instructed by your surgeon. STOP YOUR ASPIRIN AND TUMERIC NOW.   -Do not take supplements such as fish oil (omega 3), red yeast rice, turmeric before your surgery. You want to avoid medications with aspirin in them including headache powders such as BC or Goody's), Excedrin migraine.   Day Before Surgery at [place] will be asked to take in a light diet the day before surgery. You will be advised you can have clear liquids up until 3 hours before your surgery.     Eat a light diet the day before surgery.  Examples including soups, broths, toast, yogurt, mashed potatoes.  AVOID GAS PRODUCING FOODS. Things to avoid include carbonated beverages (fizzy beverages,  sodas), raw fruits and raw vegetables (uncooked), or beans.   If your bowels are filled with gas, your surgeon will have difficulty visualizing your pelvic organs which increases your surgical risks.   Your role in recovery Your role is to become active as soon as directed by your doctor, while still giving yourself time to heal.  Rest when you feel tired. You will be asked to do the following in order to speed your recovery:   - Cough and breathe deeply. This helps to clear and expand your lungs and can prevent pneumonia after surgery. - [place]. Do mild physical activity. Walking or moving your legs help your circulation and body functions return to normal. Do not try to get up or walk alone the first time after surgery.   -If you develop swelling on one leg or the other, pain in the back of your leg, redness/warmth in one of your legs, please call the office or go to the Emergency Room to have a doppler to rule out a blood clot. For shortness of breath, chest pain-seek care in the Emergency Room as soon as possible. - Actively manage your pain. Managing your pain lets you move in comfort. We will ask you to rate your pain on a scale of zero to 10. It is your responsibility to tell your doctor or nurse where and how much you hurt so your pain can be treated.   Special Considerations -If you are  diabetic, you may be placed on insulin after surgery to have closer control over your blood sugars to promote healing and recovery.  This does not mean that you will be discharged on insulin.  If applicable, your oral antidiabetics will be resumed when you are tolerating a solid diet.   -Your final pathology results from surgery should be available around one week after surgery and the results will be relayed to you when available.   -Dr. Lahoma Crocker is the surgeon that assists your GYN Oncologist with surgery.  If you end up staying the night, the next day after your surgery  you will either see Dr. Denman George, Dr. Berline Lopes, or Dr. Lahoma Crocker.   -FMLA forms can be faxed to [phone] and please allow 5-7 business days for completion.   Pain Management After Surgery -You have been prescribed your pain medication and bowel regimen medications before surgery so that you can have these available when you are discharged from the hospital. The pain medication is for use ONLY AFTER surgery and a new prescription will not be given.   -Make sure that you have Tylenol and Ibuprofen at home to use on a regular basis after surgery for pain control. We recommend alternating the medications every hour to six hours since they work differently and are processed in the body differently for pain relief.   -Review the attached handout on narcotic use and their risks and side effects.   Bowel Regimen -You have been prescribed Sennakot-S to take nightly to prevent constipation especially if you are taking the narcotic pain medication intermittently.  It is important to prevent constipation and drink adequate amounts of liquids. You can stop taking this medication when you are not taking pain medication and you are back on your normal bowel routine.   Risks of Surgery Risks of surgery are low but include bleeding, infection, damage to surrounding structures, re-operation, blood clots, and very rarely death.     Blood Transfusion Information (For the consent to be signed before surgery)   We will be checking your blood type before surgery so in case of emergencies, we will know what type of blood you would need.                                             WHAT IS A BLOOD TRANSFUSION?   A transfusion is the replacement of blood or some of its parts. Blood is made up of multiple cells which provide different functions. Red blood cells carry oxygen and are used for blood loss replacement. White blood cells fight against infection. Platelets control bleeding. Plasma helps clot  blood. Other blood products are available for specialized needs, such as hemophilia or other clotting disorders. BEFORE THE TRANSFUSION Who gives blood for transfusions? You may be able to donate blood to be used at a later date on yourself (autologous donation). Relatives can be asked to donate blood. This is generally not any safer than if you have received blood from a stranger. The same precautions are taken to ensure safety when a relative's blood is donated. Healthy volunteers who are fully evaluated to make sure their blood is safe. This is blood bank blood. Transfusion therapy is the safest it has ever been in the practice of medicine. Before blood is taken from a donor, a complete history is taken to make sure that person has  no history of diseases nor engages in risky social behavior (examples are intravenous drug use or sexual activity with multiple partners). The donor's travel history is screened to minimize risk of transmitting infections, such as malaria. The donated blood is tested for signs of infectious diseases, such as HIV and hepatitis. The blood is then tested to be sure it is compatible with you in order to minimize the chance of a transfusion reaction. If you or a relative donates blood, this is often done in anticipation of surgery and is not appropriate for emergency situations. It takes many days to process the donated blood. RISKS AND COMPLICATIONS Although transfusion therapy is very safe and saves many lives, the main dangers of transfusion include: Getting an infectious disease. Developing a transfusion reaction. This is an allergic reaction to something in the blood you were given. Every precaution is taken to prevent this. The decision to have a blood transfusion has been considered carefully by your caregiver before blood is given. Blood is not given unless the benefits outweigh the risks.   AFTER SURGERY INSTRUCTIONS   Return to work: 4 weeks if applicable    Activity: 1. Be up and out of the bed during the day.  Take a nap if needed.  You may walk up steps but be careful and use the hand rail.  Stair climbing will tire you more than you think, you may need to stop part way and rest.   2. No lifting or straining for 6 weeks over 10 pounds. No pushing, pulling, straining for 6 weeks.   3. No driving for 1 week(s).  Do not drive if you are taking narcotic pain medicine and make sure that your reaction time has returned.   4. You can shower as soon as the next day after surgery. Shower daily.  Use your regular soap and water (not directly on the incision) and pat your incision(s) dry afterwards; don't rub.  No tub baths or submerging your body in water until cleared by your surgeon. If you have the soap that was given to you by pre-surgical testing that was used before surgery, you do not need to use it afterwards because this can irritate your incisions.   5. No sexual activity and nothing in the vagina for 8 weeks.   6. You may experience a small amount of clear drainage from your incisions, which is normal.  If the drainage persists, increases, or changes color please call the office.   7. Do not use creams, lotions, or ointments such as neosporin on your incisions after surgery until advised by your surgeon because they can cause removal of the dermabond glue on your incisions.     8. You may experience vaginal spotting after surgery or around the 6-8 week mark from surgery when the stitches at the top of the vagina begin to dissolve.  The spotting is normal but if you experience heavy bleeding, call our office.   9. Take Tylenol or ibuprofen first for pain and only use narcotic pain medication for severe pain not relieved by the Tylenol or Ibuprofen.  Monitor your Tylenol intake to a max of 4,000 mg in a 24 hour period. You can alternate these medications after surgery.   Diet: 1. Low sodium Heart Healthy Diet is recommended but you are cleared to  resume your normal (before surgery) diet after your procedure.   2. It is safe to use a laxative, such as Miralax or Colace, if you have difficulty moving  your bowels. You have been prescribed Sennakot at bedtime every evening to keep bowel movements regular and to prevent constipation.     Wound Care: 1. Keep clean and dry.  Shower daily.   Reasons to call the Doctor: Fever - Oral temperature greater than 100.4 degrees Fahrenheit Foul-smelling vaginal discharge Difficulty urinating Nausea and vomiting Increased pain at the site of the incision that is unrelieved with pain medicine. Difficulty breathing with or without chest pain New calf pain especially if only on one side Sudden, continuing increased vaginal bleeding with or without clots.   Contacts: For questions or concerns you should contact:   Dr. Everitt Amber at [phone]   Joylene John, NP at [phone]   After Hours: call [phone] and have the GYN Oncologist paged/contacted (after 5 pm or on the weekends).   Messages sent via mychart are for non-urgent matters and are not responded to after hours so for urgent needs, please call the after hours number.

## 2021-05-21 NOTE — Progress Notes (Signed)
Patient here with her husband for consultation with Dr. Everitt Amber and for pre-operative discussion prior to her scheduled surgery on June 03, 2021. She is scheduled for a robotic assisted total laparoscopic hysterectomy (removal of the uterus and cervix), bilateral salpingo-oophorectomy (removal of both ovaries and fallopian tubes), possible sentinel lymph node biopsy. The surgery was discussed in detail.  See after visit summary for additional details. Visual aids used to discuss items related to surgery including sequential compression stockings, foley catheter, IV pump, multi-modal pain regimen including tylenol, photo of the surgical robot, female reproductive system to discuss surgery in detail.      Discussed post-op pain management in detail including the aspects of the enhanced recovery pathway.  Advised her that a new prescription would be sent in for tramadol and it is only to be used for after her upcoming surgery.  We discussed the use of tylenol post-op and to monitor for a maximum of 4,000 mg in a 24 hour period.  Advised we would follow up on the results of her Cmet today and inform her about using Tylenol post-op given the hx of elevated liver enzymes in the past. Also prescribed sennakot to be used after surgery and to hold if having loose stools.  Discussed bowel regimen in detail.     Discussed the use of  SCDs, and measures to take at home to prevent DVT including frequent mobility.  Reportable signs and symptoms of DVT discussed. Post-operative instructions discussed and expectations for after surgery. Incisional care discussed as well including reportable signs and symptoms including erythema, drainage, wound separation.     10 minutes spent with the patient.  Verbalizing understanding of material discussed. No needs or concerns voiced at the end of the visit.   Advised patient and family to call for any needs.  Advised that her post-operative medications had been prescribed and could be  picked up at any time.     Instructions for her upcoming MRI discussed. She will be contacted with the results of her lab work and MRI when available.  This appointment is included in the global surgical bundle as a pre-operative discussion and has no charge.

## 2021-05-22 ENCOUNTER — Other Ambulatory Visit: Payer: Self-pay | Admitting: Gynecologic Oncology

## 2021-05-22 DIAGNOSIS — N858 Other specified noninflammatory disorders of uterus: Secondary | ICD-10-CM

## 2021-05-23 ENCOUNTER — Ambulatory Visit: Payer: Managed Care, Other (non HMO) | Admitting: Gynecologic Oncology

## 2021-05-26 ENCOUNTER — Encounter: Payer: Self-pay | Admitting: Gynecologic Oncology

## 2021-05-26 NOTE — Patient Instructions (Signed)
Peer to peer performed with Dr. Remi Deter for the MRI. MRI approved with auth # [id number].

## 2021-05-30 ENCOUNTER — Other Ambulatory Visit: Payer: Managed Care, Other (non HMO)

## 2021-05-30 NOTE — Patient Instructions (Addendum)
DUE TO COVID-19 ONLY ONE VISITOR IS ALLOWED TO COME WITH YOU AND STAY IN THE WAITING ROOM ONLY DURING PRE OP AND PROCEDURE DAY OF SURGERY. THE 1 VISITOR  MAY VISIT WITH YOU AFTER SURGERY IN YOUR PRIVATE ROOM DURING VISITING HOURS ONLY!               Leah Diaz   Your procedure is scheduled on: 06/03/21   Report to [facility] Main  Entrance   Report to admitting at : 11:00 AM     Call this number if you have problems the morning of surgery [phone]    Remember: Do not eat solid food :After Midnight. Clear liquids until: 10:00 AM.  CLEAR LIQUID DIET  Foods Allowed                                                                     Foods Excluded  Coffee and tea, regular and decaf                             liquids that you cannot  Plain Jell-O any favor except red or purple                                           see through such as: Fruit ices (not with fruit pulp)                                     milk, soups, orange juice  Iced Popsicles                                    All solid food Carbonated beverages, regular and diet                                    Cranberry, grape and apple juices Sports drinks like Gatorade Lightly seasoned clear broth or consume(fat free) Sugar, honey syrup  Sample Menu Breakfast                                Lunch                                     Supper Cranberry juice                    Beef broth                            Chicken broth Jell-O                                     Grape  juice                           Apple juice Coffee or tea                        Jell-O                                      Popsicle                                                Coffee or tea                        Coffee or tea  _Eat a light diet the day before surgery.  Examples including soups, broths, toast, yogurt, mashed potatoes.  Things to avoid include carbonated beverages (fizzy beverages), raw fruits and raw vegetables, or  beans.   If your bowels are filled with gas, your surgeon will have difficulty visualizing your pelvic organs which increases your surgical risks. ____________________________________________________________________  BRUSH YOUR TEETH MORNING OF SURGERY AND RINSE YOUR MOUTH OUT, NO CHEWING GUM CANDY OR MINTS.    Take these medicines the morning of surgery with A SIP OF WATER: cetirizine,omeprazole.                               You may not have any metal on your body including hair pins and              piercings  Do not wear jewelry, make-up, lotions, powders or perfumes, deodorant             Do not wear nail polish on your fingernails.  Do not shave  48 hours prior to surgery.    Do not bring valuables to the hospital. [place].  Contacts, dentures or bridgework may not be worn into surgery.  Leave suitcase in the car. After surgery it may be brought to your room.     Patients discharged the day of surgery will not be allowed to drive home. IF YOU ARE HAVING SURGERY AND GOING HOME THE SAME DAY, YOU MUST HAVE AN ADULT TO DRIVE YOU HOME AND BE WITH YOU FOR 24 HOURS. YOU MAY GO HOME BY TAXI OR UBER OR ORTHERWISE, BUT AN ADULT MUST ACCOMPANY YOU HOME AND STAY WITH YOU FOR 24 HOURS.  Name and phone number of your driver:  Special Instructions: N/A              Please read over the following fact sheets you were given: _____________________________________________________________________           [facility] - Preparing for Surgery Before surgery, you can play an important role.  Because skin is not sterile, your skin needs to be as free of germs as possible.  You can reduce the number of germs on your skin by washing with CHG (chlorahexidine gluconate) soap before surgery.  CHG is an antiseptic cleaner which kills germs and bonds with the skin to continue killing  germs even after washing. Please DO NOT use if you have an allergy to CHG or  antibacterial soaps.  If your skin becomes reddened/irritated stop using the CHG and inform your nurse when you arrive at Short Stay. Do not shave (including legs and underarms) for at least 48 hours prior to the first CHG shower.  You may shave your face/neck. Please follow these instructions carefully:  1.  Shower with CHG Soap the night before surgery and the  morning of Surgery.  2.  If you choose to wash your hair, wash your hair first as usual with your  normal  shampoo.  3.  After you shampoo, rinse your hair and body thoroughly to remove the  shampoo.                           4.  Use CHG as you would any other liquid soap.  You can apply chg directly  to the skin and wash                       Gently with a scrungie or clean washcloth.  5.  Apply the CHG Soap to your body ONLY FROM THE NECK DOWN.   Do not use on face/ open                           Wound or open sores. Avoid contact with eyes, ears mouth and genitals (private parts).                       Wash face,  Genitals (private parts) with your normal soap.             6.  Wash thoroughly, paying special attention to the area where your surgery  will be performed.  7.  Thoroughly rinse your body with warm water from the neck down.  8.  DO NOT shower/wash with your normal soap after using and rinsing off  the CHG Soap.                9.  Pat yourself dry with a clean towel.            10.  Wear clean pajamas.            11.  Place clean sheets on your bed the night of your first shower and do not  sleep with pets. Day of Surgery : Do not apply any lotions/deodorants the morning of surgery.  Please wear clean clothes to the hospital/surgery center.  FAILURE TO FOLLOW THESE INSTRUCTIONS MAY RESULT IN THE CANCELLATION OF YOUR SURGERY PATIENT SIGNATURE_________________________________  NURSE SIGNATURE__________________________________  ________________________________________________________________________

## 2021-06-02 ENCOUNTER — Other Ambulatory Visit: Payer: Self-pay

## 2021-06-02 ENCOUNTER — Encounter (HOSPITAL_COMMUNITY)
Admission: RE | Admit: 2021-06-02 | Discharge: 2021-06-02 | Disposition: A | Discharge status: Home / Self Care | Payer: Managed Care, Other (non HMO) | Source: Ambulatory Visit | Attending: Gynecologic Oncology | Admitting: Gynecologic Oncology

## 2021-06-02 ENCOUNTER — Ambulatory Visit
Admission: RE | Admit: 2021-06-02 | Discharge: 2021-06-02 | Disposition: A | Discharge status: Home / Self Care | Payer: Managed Care, Other (non HMO) | Source: Ambulatory Visit | Attending: Gynecologic Oncology | Admitting: Gynecologic Oncology

## 2021-06-02 ENCOUNTER — Telehealth: Payer: Self-pay

## 2021-06-02 ENCOUNTER — Encounter (HOSPITAL_COMMUNITY): Payer: Self-pay

## 2021-06-02 DIAGNOSIS — Z01818 Encounter for other preprocedural examination: Secondary | ICD-10-CM | POA: Diagnosis present

## 2021-06-02 DIAGNOSIS — N858 Other specified noninflammatory disorders of uterus: Secondary | ICD-10-CM

## 2021-06-02 HISTORY — DX: Anemia, unspecified: D64.9

## 2021-06-02 HISTORY — DX: Essential (primary) hypertension: I10

## 2021-06-02 HISTORY — DX: Unspecified osteoarthritis, unspecified site: M19.90

## 2021-06-02 LAB — CBC
HCT: 38.9 % (ref 36.0–46.0)
Hemoglobin: 13 g/dL (ref 12.0–15.0)
MCH: 29 pg (ref 26.0–34.0)
MCHC: 33.4 g/dL (ref 30.0–36.0)
MCV: 86.8 fL (ref 80.0–100.0)
Platelets: 317 10*3/uL (ref 150–400)
RBC: 4.48 MIL/uL (ref 3.87–5.11)
RDW: 13.8 % (ref 11.5–15.5)
WBC: 5.9 10*3/uL (ref 4.0–10.5)
nRBC: 0 % (ref 0.0–0.2)

## 2021-06-02 LAB — BASIC METABOLIC PANEL
Anion gap: 9 (ref 5–15)
BUN: 14 mg/dL (ref 8–23)
CO2: 27 mmol/L (ref 22–32)
Calcium: 9.4 mg/dL (ref 8.9–10.3)
Chloride: 101 mmol/L (ref 98–111)
Creatinine, Ser: 0.66 mg/dL (ref 0.44–1.00)
GFR, Estimated: >60 mL/min (ref >60)
Glucose, Bld: 119 mg/dL — ABNORMAL HIGH (ref 70–99)
Potassium: 3.7 mmol/L (ref 3.5–5.1)
Sodium: 137 mmol/L (ref 135–145)

## 2021-06-02 LAB — URINALYSIS, ROUTINE W REFLEX MICROSCOPIC
Bilirubin Urine: NEGATIVE
Glucose, UA: NEGATIVE mg/dL
Hgb urine dipstick: NEGATIVE
Ketones, ur: NEGATIVE mg/dL
Leukocytes,Ua: NEGATIVE
Nitrite: NEGATIVE
Protein, ur: NEGATIVE mg/dL
Specific Gravity, Urine: 1.002 — ABNORMAL LOW (ref 1.005–1.030)
pH: 7 (ref 5.0–8.0)

## 2021-06-02 MED ORDER — GADOBENATE DIMEGLUMINE 529 MG/ML IV SOLN
19.0000 mL | Freq: Once | INTRAVENOUS | Status: AC | PRN
  Administered 2021-06-02: 19 mL via INTRAVENOUS

## 2021-06-02 NOTE — Progress Notes (Signed)
COVID Vaccine Completed: Yes Date COVID Vaccine completed: 02/2020 COVID vaccine manufacturer:   Moderna     PCP - Dr. Judd Lien Cardiologist -   Chest x-ray -  EKG -  Stress Test -  ECHO -  Cardiac Cath -  Pacemaker/ICD device last checked:  Sleep Study -  CPAP -   Fasting Blood Sugar -  Checks Blood Sugar _____ times a day  Blood Thinner Instructions: Aspirin Instructions: Last Dose:  Anesthesia review:   Patient denies shortness of breath, fever, cough and chest pain at PAT appointment   Patient verbalized understanding of instructions that were given to them at the PAT appointment. Patient was also instructed that they will need to review over the PAT instructions again at home before surgery.

## 2021-06-02 NOTE — Telephone Encounter (Signed)
Telephone call to check on pre-operative status.  Patient compliant with pre-operative instructions.  Reinforced NPO after midnight.  Patient unsure if she should take her losartan-hydrochlorothiazide tonight. Reached out to [facility] with pre-admission testing. It is ok to take tonight but nothing tomorrow. Patient verbalized understanding. Instructed to call for any needs.

## 2021-06-03 ENCOUNTER — Ambulatory Visit (HOSPITAL_COMMUNITY): Payer: Managed Care, Other (non HMO) | Admitting: Anesthesiology

## 2021-06-03 ENCOUNTER — Encounter (HOSPITAL_COMMUNITY): Payer: Self-pay | Admitting: Gynecologic Oncology

## 2021-06-03 ENCOUNTER — Ambulatory Visit (HOSPITAL_COMMUNITY)
Admission: RE | Admit: 2021-06-03 | Discharge: 2021-06-03 | Disposition: A | Discharge status: Home / Self Care | Payer: Managed Care, Other (non HMO) | Attending: Gynecologic Oncology | Admitting: Gynecologic Oncology

## 2021-06-03 ENCOUNTER — Encounter (HOSPITAL_COMMUNITY)
Admission: RE | Disposition: A | Discharge status: Home / Self Care | Payer: Self-pay | Source: Home / Self Care | Attending: Gynecologic Oncology

## 2021-06-03 ENCOUNTER — Other Ambulatory Visit: Payer: Self-pay

## 2021-06-03 DIAGNOSIS — C55 Malignant neoplasm of uterus, part unspecified: Secondary | ICD-10-CM | POA: Insufficient documentation

## 2021-06-03 DIAGNOSIS — C549 Malignant neoplasm of corpus uteri, unspecified: Secondary | ICD-10-CM

## 2021-06-03 DIAGNOSIS — N950 Postmenopausal bleeding: Secondary | ICD-10-CM | POA: Diagnosis present

## 2021-06-03 DIAGNOSIS — Z79899 Other long term (current) drug therapy: Secondary | ICD-10-CM | POA: Insufficient documentation

## 2021-06-03 DIAGNOSIS — Z7982 Long term (current) use of aspirin: Secondary | ICD-10-CM | POA: Insufficient documentation

## 2021-06-03 DIAGNOSIS — N858 Other specified noninflammatory disorders of uterus: Secondary | ICD-10-CM

## 2021-06-03 HISTORY — PX: ROBOTIC ASSISTED TOTAL HYSTERECTOMY WITH BILATERAL SALPINGO OOPHERECTOMY: SHX6086

## 2021-06-03 LAB — TYPE AND SCREEN
ABO/RH(D): O POS
Antibody Screen: NEGATIVE

## 2021-06-03 LAB — ABO/RH: ABO/RH(D): O POS

## 2021-06-03 SURGERY — HYSTERECTOMY, TOTAL, ROBOT-ASSISTED, LAPAROSCOPIC, WITH BILATERAL SALPINGO-OOPHORECTOMY
Anesthesia: General | Site: Abdomen

## 2021-06-03 MED ORDER — CELECOXIB 200 MG PO CAPS
400.0000 mg | ORAL_CAPSULE | ORAL | Status: AC
  Administered 2021-06-03: 400 mg via ORAL
  Filled 2021-06-03: qty 2

## 2021-06-03 MED ORDER — ONDANSETRON HCL 4 MG/2ML IJ SOLN
INTRAMUSCULAR | Status: DC | PRN
Start: 2021-06-03 — End: 2021-06-03
  Administered 2021-06-03: 4 mg via INTRAVENOUS

## 2021-06-03 MED ORDER — STERILE WATER FOR INJECTION IJ SOLN
INTRAMUSCULAR | Status: AC
  Filled 2021-06-03: qty 10

## 2021-06-03 MED ORDER — BUPIVACAINE LIPOSOME 1.3 % IJ SUSP
20.0000 mL | Freq: Once | INTRAMUSCULAR | Status: AC
  Administered 2021-06-03: 20 mL
  Filled 2021-06-03: qty 20

## 2021-06-03 MED ORDER — FENTANYL CITRATE (PF) 100 MCG/2ML IJ SOLN
INTRAMUSCULAR | Status: AC
  Filled 2021-06-03: qty 2

## 2021-06-03 MED ORDER — ACETAMINOPHEN 500 MG PO TABS
1000.0000 mg | ORAL_TABLET | ORAL | Status: AC
Start: 2021-06-03 — End: 2021-06-03
  Administered 2021-06-03: 1000 mg via ORAL
  Filled 2021-06-03: qty 2

## 2021-06-03 MED ORDER — SODIUM CHLORIDE 0.9% FLUSH: 3.0000 mL | Freq: Two times a day (BID) | INTRAVENOUS | Status: DC

## 2021-06-03 MED ORDER — STERILE WATER FOR IRRIGATION IR SOLN
Status: DC | PRN
  Administered 2021-06-03: 1000 mL

## 2021-06-03 MED ORDER — PROPOFOL 10 MG/ML IV BOLUS
INTRAVENOUS | Status: DC | PRN
  Administered 2021-06-03: 130 mg via INTRAVENOUS

## 2021-06-03 MED ORDER — PROPOFOL 10 MG/ML IV BOLUS
INTRAVENOUS | Status: AC
  Filled 2021-06-03: qty 20

## 2021-06-03 MED ORDER — LIDOCAINE 20MG/ML (2%) 15 ML SYRINGE OPTIME
INTRAMUSCULAR | Status: DC | PRN
  Administered 2021-06-03: 1.5 mg/kg/h via INTRAVENOUS

## 2021-06-03 MED ORDER — GABAPENTIN 300 MG PO CAPS
300.0000 mg | ORAL_CAPSULE | ORAL | Status: AC
  Administered 2021-06-03: 300 mg via ORAL
  Filled 2021-06-03: qty 1

## 2021-06-03 MED ORDER — OXYCODONE HCL 5 MG/5ML PO SOLN
5.0000 mg | Freq: Once | ORAL | Status: AC | PRN
Start: 2021-06-03 — End: 2021-06-03

## 2021-06-03 MED ORDER — LIDOCAINE HCL (PF) 2 % IJ SOLN
INTRAMUSCULAR | Status: AC
  Filled 2021-06-03: qty 20

## 2021-06-03 MED ORDER — PHENYLEPHRINE HCL-NACL 20-0.9 MG/250ML-% IV SOLN
INTRAVENOUS | Status: DC | PRN
  Administered 2021-06-03: 50 ug/min via INTRAVENOUS

## 2021-06-03 MED ORDER — MIDAZOLAM HCL 2 MG/2ML IJ SOLN
INTRAMUSCULAR | Status: AC
  Filled 2021-06-03: qty 2

## 2021-06-03 MED ORDER — DEXAMETHASONE SODIUM PHOSPHATE 4 MG/ML IJ SOLN
4.0000 mg | INTRAMUSCULAR | Status: AC
  Administered 2021-06-03: 4 mg via INTRAVENOUS

## 2021-06-03 MED ORDER — LACTATED RINGERS IR SOLN
Status: DC | PRN
  Administered 2021-06-03: 1000 mL

## 2021-06-03 MED ORDER — SCOPOLAMINE 1 MG/3DAYS TD PT72
1.0000 | MEDICATED_PATCH | TRANSDERMAL | Status: DC
  Administered 2021-06-03: 1.5 mg via TRANSDERMAL
  Filled 2021-06-03: qty 1

## 2021-06-03 MED ORDER — LACTATED RINGERS IV SOLN: INTRAVENOUS | Status: DC

## 2021-06-03 MED ORDER — BUPIVACAINE HCL 0.25 % IJ SOLN
INTRAMUSCULAR | Status: AC
  Filled 2021-06-03: qty 1

## 2021-06-03 MED ORDER — LIDOCAINE 2% (20 MG/ML) 5 ML SYRINGE
INTRAMUSCULAR | Status: DC | PRN
  Administered 2021-06-03: 60 mg via INTRAVENOUS

## 2021-06-03 MED ORDER — SODIUM CHLORIDE 0.9 % IV SOLN
2.0000 g | INTRAVENOUS | Status: AC
  Administered 2021-06-03: 2 g via INTRAVENOUS
  Filled 2021-06-03: qty 2

## 2021-06-03 MED ORDER — KETOROLAC TROMETHAMINE 30 MG/ML IJ SOLN
INTRAMUSCULAR | Status: AC
  Filled 2021-06-03: qty 1

## 2021-06-03 MED ORDER — BUPIVACAINE HCL 0.25 % IJ SOLN
INTRAMUSCULAR | Status: DC | PRN
  Administered 2021-06-03: 26 mL
  Administered 2021-06-03: 24 mL

## 2021-06-03 MED ORDER — ORAL CARE MOUTH RINSE: 15.0000 mL | Freq: Once | OROMUCOSAL | Status: AC

## 2021-06-03 MED ORDER — FENTANYL CITRATE (PF) 100 MCG/2ML IJ SOLN
INTRAMUSCULAR | Status: DC | PRN
  Administered 2021-06-03 (×3): 50 ug via INTRAVENOUS

## 2021-06-03 MED ORDER — OXYCODONE HCL 5 MG PO TABS
5.0000 mg | ORAL_TABLET | Freq: Once | ORAL | Status: AC | PRN
Start: 2021-06-03 — End: 2021-06-03
  Administered 2021-06-03: 5 mg via ORAL

## 2021-06-03 MED ORDER — OXYCODONE HCL 5 MG PO TABS
ORAL_TABLET | ORAL | Status: AC
  Filled 2021-06-03: qty 1

## 2021-06-03 MED ORDER — FENTANYL CITRATE (PF) 100 MCG/2ML IJ SOLN
25.0000 ug | INTRAMUSCULAR | Status: DC | PRN
  Administered 2021-06-03 (×2): 25 ug via INTRAVENOUS

## 2021-06-03 MED ORDER — SUGAMMADEX SODIUM 200 MG/2ML IV SOLN
INTRAVENOUS | Status: DC | PRN
  Administered 2021-06-03: 200 mg via INTRAVENOUS

## 2021-06-03 MED ORDER — PHENYLEPHRINE 40 MCG/ML (10ML) SYRINGE FOR IV PUSH (FOR BLOOD PRESSURE SUPPORT)
PREFILLED_SYRINGE | INTRAVENOUS | Status: DC | PRN
  Administered 2021-06-03 (×4): 80 ug via INTRAVENOUS

## 2021-06-03 MED ORDER — MIDAZOLAM HCL 5 MG/5ML IJ SOLN
INTRAMUSCULAR | Status: DC | PRN
Start: 2021-06-03 — End: 2021-06-03
  Administered 2021-06-03: 2 mg via INTRAVENOUS

## 2021-06-03 MED ORDER — ENOXAPARIN SODIUM 40 MG/0.4ML IJ SOSY
40.0000 mg | PREFILLED_SYRINGE | INTRAMUSCULAR | Status: AC
  Administered 2021-06-03: 40 mg via SUBCUTANEOUS
  Filled 2021-06-03: qty 0.4

## 2021-06-03 MED ORDER — KETAMINE HCL 10 MG/ML IJ SOLN
INTRAMUSCULAR | Status: DC | PRN
  Administered 2021-06-03: 32 mg via INTRAVENOUS

## 2021-06-03 MED ORDER — CHLORHEXIDINE GLUCONATE 0.12 % MT SOLN
15.0000 mL | Freq: Once | OROMUCOSAL | Status: AC
  Administered 2021-06-03: 15 mL via OROMUCOSAL

## 2021-06-03 MED ORDER — PROMETHAZINE HCL 25 MG/ML IJ SOLN: 6.2500 mg | INTRAMUSCULAR | Status: DC | PRN

## 2021-06-03 MED ORDER — KETAMINE HCL 10 MG/ML IJ SOLN
INTRAMUSCULAR | Status: AC
  Filled 2021-06-03: qty 1

## 2021-06-03 MED ORDER — ROCURONIUM BROMIDE 10 MG/ML (PF) SYRINGE
PREFILLED_SYRINGE | INTRAVENOUS | Status: DC | PRN
Start: 2021-06-03 — End: 2021-06-03
  Administered 2021-06-03: 70 mg via INTRAVENOUS
  Administered 2021-06-03: 10 mg via INTRAVENOUS

## 2021-06-03 SURGICAL SUPPLY — 75 items
APPLICATOR SURGIFLO ENDO (HEMOSTASIS) IMPLANT
BACTOSHIELD CHG 4% 4OZ (MISCELLANEOUS) ×1
BAG COUNTER SPONGE SURGICOUNT (BAG) IMPLANT
BAG LAPAROSCOPIC 12 15 PORT 16 (BASKET) ×2 IMPLANT
BAG RETRIEVAL 12/15 (BASKET) ×3
BLADE SURG SZ10 CARB STEEL (BLADE) ×3 IMPLANT
CELLS DAT CNTRL 66122 CELL SVR (MISCELLANEOUS) IMPLANT
COVER BACK TABLE 60X90IN (DRAPES) ×3 IMPLANT
COVER TIP SHEARS 8 DVNC (MISCELLANEOUS) ×2 IMPLANT
COVER TIP SHEARS 8MM DA VINCI (MISCELLANEOUS) ×1
DECANTER SPIKE VIAL GLASS SM (MISCELLANEOUS) IMPLANT
DERMABOND ADVANCED (GAUZE/BANDAGES/DRESSINGS) ×2
DERMABOND ADVANCED .7 DNX12 (GAUZE/BANDAGES/DRESSINGS) ×4 IMPLANT
DRAPE ARM DVNC X/XI (DISPOSABLE) ×8 IMPLANT
DRAPE COLUMN DVNC XI (DISPOSABLE) ×2 IMPLANT
DRAPE DA VINCI XI ARM (DISPOSABLE) ×4
DRAPE DA VINCI XI COLUMN (DISPOSABLE) ×1
DRAPE SHEET LG 3/4 BI-LAMINATE (DRAPES) ×3 IMPLANT
DRAPE SURG IRRIG POUCH 19X23 (DRAPES) ×3 IMPLANT
DRSG OPSITE POSTOP 4X6 (GAUZE/BANDAGES/DRESSINGS) ×3 IMPLANT
DRSG OPSITE POSTOP 4X8 (GAUZE/BANDAGES/DRESSINGS) IMPLANT
ELECT PENCIL ROCKER SW 15FT (MISCELLANEOUS) ×3 IMPLANT
ELECT REM PT RETURN 15FT ADLT (MISCELLANEOUS) ×3 IMPLANT
GLOVE SURG ENC MOIS LTX SZ6 (GLOVE) ×12 IMPLANT
GLOVE SURG ENC MOIS LTX SZ6.5 (GLOVE) ×9 IMPLANT
GOWN STRL REUS W/ TWL LRG LVL3 (GOWN DISPOSABLE) ×12 IMPLANT
GOWN STRL REUS W/TWL LRG LVL3 (GOWN DISPOSABLE) ×6
HOLDER FOLEY CATH W/STRAP (MISCELLANEOUS) IMPLANT
IRRIG SUCT STRYKERFLOW 2 WTIP (MISCELLANEOUS) ×3
IRRIGATION SUCT STRKRFLW 2 WTP (MISCELLANEOUS) ×2 IMPLANT
KIT PROCEDURE DA VINCI SI (MISCELLANEOUS)
KIT PROCEDURE DVNC SI (MISCELLANEOUS) IMPLANT
KIT TURNOVER KIT A (KITS) ×3 IMPLANT
MANIPULATOR UTERINE 4.5 ZUMI (MISCELLANEOUS) ×3 IMPLANT
NEEDLE HYPO 22GX1.5 SAFETY (NEEDLE) ×3 IMPLANT
NEEDLE SPNL 18GX3.5 QUINCKE PK (NEEDLE) IMPLANT
OBTURATOR OPTICAL STANDARD 8MM (TROCAR) ×1
OBTURATOR OPTICAL STND 8 DVNC (TROCAR) ×2
OBTURATOR OPTICALSTD 8 DVNC (TROCAR) ×2 IMPLANT
PACK ROBOT GYN CUSTOM WL (TRAY / TRAY PROCEDURE) ×3 IMPLANT
PAD POSITIONING PINK XL (MISCELLANEOUS) ×3 IMPLANT
PORT ACCESS TROCAR AIRSEAL 12 (TROCAR) ×2 IMPLANT
PORT ACCESS TROCAR AIRSEAL 5M (TROCAR) ×1
POUCH SPECIMEN RETRIEVAL 10MM (ENDOMECHANICALS) IMPLANT
RETRACTOR WND ALEXIS 25 LRG (MISCELLANEOUS) IMPLANT
RTRCTR WOUND ALEXIS 18CM MED (MISCELLANEOUS)
RTRCTR WOUND ALEXIS 25CM LRG (MISCELLANEOUS)
SCRUB CHG 4% DYNA-HEX 4OZ (MISCELLANEOUS) ×2 IMPLANT
SEAL CANN UNIV 5-8 DVNC XI (MISCELLANEOUS) ×6 IMPLANT
SEAL XI 5MM-8MM UNIVERSAL (MISCELLANEOUS) ×3
SET TRI-LUMEN FLTR TB AIRSEAL (TUBING) ×3 IMPLANT
SOL ANTI FOG 6CC (MISCELLANEOUS) ×2 IMPLANT
SOLUTION ANTI FOG 6CC (MISCELLANEOUS) ×1
SPONGE T-LAP 18X18 ~~LOC~~+RFID (SPONGE) ×3 IMPLANT
SURGIFLO W/THROMBIN 8M KIT (HEMOSTASIS) IMPLANT
SUT MNCRL AB 4-0 PS2 18 (SUTURE) ×3 IMPLANT
SUT PDS AB 1 TP1 96 (SUTURE) ×3 IMPLANT
SUT VIC AB 0 CT1 27 (SUTURE) ×1
SUT VIC AB 0 CT1 27XBRD ANTBC (SUTURE) ×2 IMPLANT
SUT VIC AB 0 CT1 36 (SUTURE) ×9 IMPLANT
SUT VIC AB 2-0 CT1 27 (SUTURE)
SUT VIC AB 2-0 CT1 TAPERPNT 27 (SUTURE) IMPLANT
SUT VIC AB 4-0 PS2 18 (SUTURE) ×9 IMPLANT
SUT VLOC 180 0 9IN  GS21 (SUTURE) ×1
SUT VLOC 180 0 9IN GS21 (SUTURE) ×2 IMPLANT
SYR 10ML LL (SYRINGE) IMPLANT
SYR 20ML LL LF (SYRINGE) IMPLANT
SYR 50ML LL SCALE MARK (SYRINGE) IMPLANT
TOWEL OR NON WOVEN STRL DISP B (DISPOSABLE) ×3 IMPLANT
TRAP SPECIMEN MUCUS 40CC (MISCELLANEOUS) IMPLANT
TRAY FOLEY MTR SLVR 16FR STAT (SET/KITS/TRAYS/PACK) ×3 IMPLANT
TROCAR XCEL NON-BLD 5MMX100MML (ENDOMECHANICALS) IMPLANT
UNDERPAD 30X36 HEAVY ABSORB (UNDERPADS AND DIAPERS) ×3 IMPLANT
WATER STERILE IRR 1000ML POUR (IV SOLUTION) ×3 IMPLANT
YANKAUER SUCT BULB TIP 10FT TU (MISCELLANEOUS) ×3 IMPLANT

## 2021-06-03 NOTE — Discharge Instructions (Addendum)
Return to work: 4 weeks (2 weeks with physical restrictions).  Activity: 1. Be up and out of the bed during the day.  Take a nap if needed.  You may walk up steps but be careful and use the hand rail.  Stair climbing will tire you more than you think, you may need to stop part way and rest.   2. No lifting or straining for 4 weeks.  3. No driving for 1 weeks.  Do Not drive if you are taking narcotic pain medicine.  4. Shower daily.  Use soap and water on your incision and pat dry; don't rub.   5. No sexual activity and nothing in the vagina for 8 weeks.  Medications:  - Take ibuprofen and tylenol first line for pain control. Take these regularly (every 6 hours) to decrease the build up of pain.  - If necessary, for severe pain not relieved by ibuprofen, contact Dr Serita Grit office and you will be prescribed percocet.  - While taking percocet you should take sennakot every night to reduce the likelihood of constipation. If this causes diarrhea, stop its use.  Diet: 1. Low sodium Heart Healthy Diet is recommended.  2. It is safe to use a laxative if you have difficulty moving your bowels.   Wound Care: 1. Keep clean and dry.  Shower daily. 2. You can get the dressing wet in the shower, but avoid tub baths. 3. Remove the dressing between 5 and 7 days after your surgery (1 week after your operation). 4. After the dressing is removed you can get the incision wet in the shower, however continue to avoid tub baths until advised otherwise by your surgeon at follow-up.    Reasons to call the Doctor:  Fever - Oral temperature greater than 100.4 degrees Fahrenheit Foul-smelling vaginal discharge Difficulty urinating Nausea and vomiting Increased pain at the site of the incision that is unrelieved with pain medicine. Difficulty breathing with or without chest pain New calf pain especially if only on one side Sudden, continuing increased vaginal bleeding with or without clots.    Follow-up: 1. See Everitt Amber in 4 weeks.  Contacts: For questions or concerns you should contact:  Dr. Everitt Amber at [phone] After hours and on week-ends call [phone] and ask to speak to the physician on call for Gynecologic Oncology

## 2021-06-03 NOTE — Interval H&P Note (Signed)
History and Physical Interval Note:  06/03/2021 1:09 PM  [name]  has presented today for surgery, with the diagnosis of UTERINE MASS.  The various methods of treatment have been discussed with the patient and family. After consideration of risks, benefits and other options for treatment, the patient has consented to  Procedure(s): XI ROBOTIC ASSISTED TOTAL HYSTERECTOMY WITH BILATERAL SALPINGO OOPHORECTOMY (N/A) POSSIBLE SENTINEL LYMPH NODE BIOPSY (N/A) as a surgical intervention.  The patient's history has been reviewed, patient examined, no change in status, stable for surgery.  I have reviewed the patient's chart and labs.  Questions were answered to the patient's satisfaction.     Thereasa Solo

## 2021-06-03 NOTE — Op Note (Signed)
OPERATIVE NOTE 06/03/21  Surgeon: Donaciano Eva   Assistants: Dr Lahoma Crocker (an MD assistant was necessary for tissue manipulation, management of robotic instrumentation, retraction and positioning due to the complexity of the case and hospital policies).   Anesthesia: General endotracheal anesthesia  ASA Class: 3   Pre-operative Diagnosis:  uterine mass, post-menopausal bleeding  Post-operative Diagnosis:  same and uterine sarcoma, clinical stage I  Operation: Robotic-assisted laparoscopic total hysterectomy >250gm with bilateral salpingoophorectomy, minilaparotomy for specimen delivery  Surgeon: Donaciano Eva  Assistant Surgeon: Lahoma Crocker MD  Anesthesia: GET  Urine Output: 50cc  Operative Findings:  : Bulky 16cm uterus with intra-uterine mass (consistent with a fibroid-like mass) , smooth normal appearing serosa, no suspicious bulky nodes. Normal ovaries bilaterally. Normal upper abdomen. Uterus too large to deliver vaginally in tact.   Frozen section consistent with sarcoma.   Estimated Blood Loss:   25cc       Total IV Fluids: 800 ml         Specimens:  uterus, cervix, bilateral tubes and ovaries.          Complications:  None; patient tolerated the procedure well.         Disposition: PACU - hemodynamically stable.  Procedure Details  The patient was seen in the Holding Room. The risks, benefits, complications, treatment options, and expected outcomes were discussed with the patient.  The patient concurred with the proposed plan, giving informed consent.  The site of surgery properly noted/marked. The patient was identified as Leah Diaz and the procedure verified as a Robotic-assisted hysterectomy with bilateral salpingo oophorectomy. A Time Out was held and the above information confirmed.  After induction of anesthesia, the patient was draped and prepped in the usual sterile manner. Pt was placed in supine position after anesthesia  and draped and prepped in the usual sterile manner. The abdominal drape was placed after the CholoraPrep had been allowed to dry for 3 minutes.  Her arms were tucked to her side with all appropriate precautions.  The shoulders were stabilized with padded shoulder blocks applied to the acromium processes.  The patient was placed in the semi-lithotomy position in [place].  The perineum was prepped with Betadine. The patient was then prepped. Foley catheter was placed.  A sterile speculum was placed in the vagina.  The cervix was grasped with a single-tooth tenaculum and dilated with Kennon Rounds dilators.  The ZUMI uterine manipulator with a medium colpotomizer ring was placed without difficulty.  A pneum occluder balloon was placed over the manipulator.  OG tube placement was confirmed and to suction.   Next, a 5 mm skin incision was made 1 cm below the subcostal margin in the midclavicular line.  The 5 mm Optiview port and scope was used for direct entry.  Opening pressure was under 10 mm CO2.  The abdomen was insufflated and the findings were noted as above.   At this point and all points during the procedure, the patient's intra-abdominal pressure did not exceed 15 mmHg. Next, a 10 mm skin incision was made in the umbilicus and a right and left port was placed about 10 cm lateral to the robot port on the right and left side.  All ports were placed under direct visualization.  The patient was placed in steep Trendelenburg.  Bowel was folded away into the upper abdomen.  The robot was docked in the normal manner.  The hysterectomy was started after the round ligament on the right side was  incised and the retroperitoneum was entered and the pararectal space was developed.  The ureter was noted to be on the medial leaf of the broad ligament.  The peritoneum above the ureter was incised and stretched and the infundibulopelvic ligament was skeletonized, cauterized and cut.  The posterior peritoneum was taken down to  the level of the KOH ring.  The anterior peritoneum was also taken down.  The bladder flap was created to the level of the KOH ring.  The uterine artery on the right side was skeletonized, cauterized and cut in the normal manner.  A similar procedure was performed on the left.  The colpotomy was made in a cruciate mannger and the uterus, cervix, bilateral ovaries and tubes were amputated and attempted to be delivered through the vagina.  However, the specimen was too large for vaginal delivery. The specimen was placed in an endocatch bag for abdominal retrieval. Pedicles were inspected and excellent hemostasis was achieved.    The colpotomy at the vaginal cuff was closed with Vicryl on a CT1 needle in a running manner and v-cloc in a 2 layer closure.  Irrigation was used and excellent hemostasis was achieved.  At this point in the procedure was completed.  Robotic instruments were removed under direct visulaization.  The robot was undocked.   A 6cm suprapubic low transverse incision was made with the scalpel. The subcutaneous skin was opened with the bovie. The fascia was opened with the bovie transversely and the rectus muscles were dissected off of the fascia inferiorally and superiorally. The peritoneum was opened sharply in the midline. The peritoneal incision was extended. The uterine specimen in the endocatch bag was retrieved through the incision. The fascia was closed with running 0-vicryl. The subcutaneous fat was closed with 2-0 vicryl. 20cc of exparel mixed with 20cc of marcaine was infiltrated into the incision. The incision was closed at the skin with monocryl and dermabond.   The 10 mm ports were closed with Vicryl on a UR-5 needle and the fascia was closed with 0 Vicryl on a UR-5 needle.  The skin was closed with 4-0 Vicryl in a subcuticular manner.  Dermabond was applied.  Sponge, lap and needle counts correct x 2.  The patient was taken to the recovery room in stable condition.  The vagina  was swabbed with  minimal bleeding noted.   All instrument and needle counts were correct x  3.   The patient was transferred to the recovery room in a stable condition.  Donaciano Eva, MD

## 2021-06-03 NOTE — Anesthesia Postprocedure Evaluation (Signed)
Anesthesia Post Note  Patient: Leah Diaz  Procedure(s) Performed: XI ROBOTIC ASSISTED TOTAL HYSTERECTOMY FOR UTERUS GREATER THAN 250G WITH BILATERAL SALPINGO OOPHORECTOMY (Abdomen)     Patient location during evaluation: PACU Anesthesia Type: General Level of consciousness: awake and alert Pain management: pain level controlled Vital Signs Assessment: post-procedure vital signs reviewed and stable Respiratory status: spontaneous breathing, nonlabored ventilation and respiratory function stable Cardiovascular status: stable and blood pressure returned to baseline Anesthetic complications: no   No notable events documented.  Last Vitals:  Vitals:   06/03/21 1615 06/03/21 1630  BP: (!) 142/81 135/86  Pulse: 85 63  Resp: 13 17  Temp:    SpO2: 96% 99%    Last Pain:  Vitals:   06/03/21 1127  TempSrc: Oral                 Audry Pili

## 2021-06-03 NOTE — Anesthesia Procedure Notes (Signed)
Procedure Name: Intubation Date/Time: 06/03/2021 1:31 PM Performed by: Pilar Grammes, CRNA Pre-anesthesia Checklist: Patient identified, Emergency Drugs available, Suction available, Patient being monitored and Timeout performed Patient Re-evaluated:Patient Re-evaluated prior to induction Oxygen Delivery Method: Circle system utilized Preoxygenation: Pre-oxygenation with 100% oxygen Induction Type: IV induction Ventilation: Mask ventilation without difficulty Laryngoscope Size: 2 Grade View: Grade II Tube type: Oral Tube size: 7.0 mm Number of attempts: 1 Airway Equipment and Method: Stylet Placement Confirmation: positive ETCO2, ETT inserted through vocal cords under direct vision, CO2 detector and breath sounds checked- equal and bilateral Secured at: 22 cm Tube secured with: Tape Dental Injury: Teeth and Oropharynx as per pre-operative assessment

## 2021-06-03 NOTE — Anesthesia Preprocedure Evaluation (Addendum)
Anesthesia Evaluation  Patient identified by MRN, date of birth, ID band Patient awake    Reviewed: Allergy & Precautions, NPO status , Patient's Chart, lab work & pertinent test results  History of Anesthesia Complications Negative for: history of anesthetic complications  Airway Mallampati: II  TM Distance: >3 FB Neck ROM: Full    Dental  (+) Dental Advisory Given, Teeth Intact   Pulmonary asthma ,    Pulmonary exam normal        Cardiovascular hypertension, Pt. on medications Normal cardiovascular exam     Neuro/Psych  Headaches, negative psych ROS   GI/Hepatic Neg liver ROS, GERD  Medicated and Controlled,  Endo/Other   Obesity   Renal/GU negative Renal ROS     Musculoskeletal  (+) Arthritis ,   Abdominal   Peds  Hematology negative hematology ROS (+)   Anesthesia Other Findings   Reproductive/Obstetrics                            Anesthesia Physical Anesthesia Plan  ASA: 2  Anesthesia Plan: General   Post-op Pain Management:    Induction: Intravenous  PONV Risk Score and Plan: 4 or greater and Treatment may vary due to age or medical condition, Ondansetron, Dexamethasone, Midazolam and Scopolamine patch - Pre-op  Airway Management Planned: Oral ETT  Additional Equipment: None  Intra-op Plan:   Post-operative Plan: Extubation in OR  Informed Consent: I have reviewed the patients History and Physical, chart, labs and discussed the procedure including the risks, benefits and alternatives for the proposed anesthesia with the patient or authorized representative who has indicated his/her understanding and acceptance.     Dental advisory given  Plan Discussed with: CRNA and Anesthesiologist  Anesthesia Plan Comments:        Anesthesia Quick Evaluation

## 2021-06-03 NOTE — Transfer of Care (Signed)
Immediate Anesthesia Transfer of Care Note  Patient: Leah Diaz  Procedure(s) Performed: XI ROBOTIC ASSISTED TOTAL HYSTERECTOMY FOR UTERUS GREATER THAN 250G WITH BILATERAL SALPINGO OOPHORECTOMY (Abdomen)  Patient Location: PACU  Anesthesia Type:General  Level of Consciousness: drowsy  Airway & Oxygen Therapy: Patient Spontanous Breathing and Patient connected to face mask oxygen  Post-op Assessment: Report given to RN and Post -op Vital signs reviewed and stable  Post vital signs: Reviewed and stable  Last Vitals:  Vitals Value Taken Time  BP    Temp    Pulse 80 06/03/21 1545  Resp 12 06/03/21 1545  SpO2 100 % 06/03/21 1545  Vitals shown include unvalidated device data.  Last Pain:  Vitals:   06/03/21 1127  TempSrc: Oral         Complications: No notable events documented.

## 2021-06-04 ENCOUNTER — Telehealth: Payer: Self-pay

## 2021-06-04 ENCOUNTER — Encounter (HOSPITAL_COMMUNITY): Payer: Self-pay | Admitting: Gynecologic Oncology

## 2021-06-04 NOTE — Telephone Encounter (Signed)
Spoke with Leah Diaz. She states she is eating, drinking and urinating well. She has not had a BM.She is not passing gas. She will increase her senokot-s to 2 tabs bid with 8 oz of water with each dose. If no good BM by Friday 06-06-21, she can add a capful of Miralax bid. Encouraged her to drink plenty of water. She denies fever or chills. Incisions are dry and intact.  Told her that she can remove the honeycomb dressing on Sunday 06-08-21. Her pain is controlled with tylenol, Ibuprofen, and tramadol.  Instructed to call office with any fever, chills, purulent drainage, uncontrolled pain or any other questions or concerns. Patient verbalizes understanding.  Pt aware of post op appointments as well as the office number [phone] and after hours number [phone] to call if she has any questions or concerns

## 2021-06-09 ENCOUNTER — Telehealth: Payer: Self-pay

## 2021-06-09 NOTE — Telephone Encounter (Signed)
Received message from [place] regarding her pathology results. She would like more information regarding the results. Dr. Denman George is not in the office today but will be notified that pathology results are back. Patient will be contacted when results are reviewed. Patient verbalized understanding.

## 2021-06-11 ENCOUNTER — Telehealth: Payer: Self-pay | Admitting: Gynecologic Oncology

## 2021-06-11 DIAGNOSIS — C549 Malignant neoplasm of corpus uteri, unspecified: Secondary | ICD-10-CM

## 2021-06-11 NOTE — Telephone Encounter (Signed)
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  Recommendation is for CT chest.  Discussion at tumor board.  Consideration for adjuvant therapy (hormonal vs chemo vs radiation) given risk factors for recurrence.   Thereasa Solo, MD

## 2021-06-12 ENCOUNTER — Telehealth: Payer: Self-pay | Admitting: Oncology

## 2021-06-12 ENCOUNTER — Encounter: Payer: Self-pay | Admitting: Oncology

## 2021-06-12 NOTE — Telephone Encounter (Signed)
[name] called back and confirmed appointment for the CT scan.

## 2021-06-12 NOTE — Progress Notes (Signed)
Requested ER/PR testing on accession [phone] with [facility] via email.

## 2021-06-12 NOTE — Telephone Encounter (Signed)
Left a message with CT scan apt for 06/18/21 at 3:45 (liquids only 4 hours prior).  Requested a return call to confirm apt.

## 2021-06-16 ENCOUNTER — Other Ambulatory Visit: Payer: Self-pay | Admitting: Gynecologic Oncology

## 2021-06-16 ENCOUNTER — Telehealth: Payer: Self-pay

## 2021-06-16 DIAGNOSIS — F40240 Claustrophobia: Secondary | ICD-10-CM

## 2021-06-16 MED ORDER — LORAZEPAM 0.5 MG PO TABS: 0.5000 mg | ORAL_TABLET | Freq: Once | ORAL | 0 refills | Status: AC

## 2021-06-16 NOTE — Telephone Encounter (Signed)
Leah Diaz stated that the mini lap incision was oozing blood yesterday as applied a pressure dressing yesterday as instructed by the on call physician Dr. Delsa Sale. The bleeding has slowed down to slight spotting on bandage.  She strained a little to move bowels yesterday and the bleeding began.  She will increase the Sen[place]ot to 1 tab in am and 2 tabs in the pm. The bleeding seems to be coming from the middle of the incision. No purulent drainage noted. Afebrile.   Pt to continue with a pressure dressing. She has a post-op appointment tomorrow afternoon and Dr. Denman George evaluate the incision at that time. Pt verbalized understanding.

## 2021-06-16 NOTE — Progress Notes (Signed)
Patient requesting ativan tablet prior to CT scan due to claustrophobia.

## 2021-06-17 ENCOUNTER — Inpatient Hospital Stay: Payer: Managed Care, Other (non HMO) | Attending: Gynecologic Oncology | Admitting: Gynecologic Oncology

## 2021-06-17 ENCOUNTER — Other Ambulatory Visit: Payer: Self-pay

## 2021-06-17 ENCOUNTER — Encounter: Payer: Self-pay | Admitting: Gynecologic Oncology

## 2021-06-17 VITALS — BP 151/59 | HR 93 | Temp 98.4°F | Resp 18 | Ht 68.0 in | Wt 211.0 lb

## 2021-06-17 DIAGNOSIS — Z90722 Acquired absence of ovaries, bilateral: Secondary | ICD-10-CM

## 2021-06-17 DIAGNOSIS — Z170 Estrogen receptor positive status [ER+]: Secondary | ICD-10-CM | POA: Insufficient documentation

## 2021-06-17 DIAGNOSIS — L7632 Postprocedural hematoma of skin and subcutaneous tissue following other procedure: Secondary | ICD-10-CM | POA: Diagnosis not present

## 2021-06-17 DIAGNOSIS — Z90710 Acquired absence of both cervix and uterus: Secondary | ICD-10-CM | POA: Diagnosis not present

## 2021-06-17 DIAGNOSIS — C55 Malignant neoplasm of uterus, part unspecified: Secondary | ICD-10-CM | POA: Diagnosis present

## 2021-06-17 DIAGNOSIS — C549 Malignant neoplasm of corpus uteri, unspecified: Secondary | ICD-10-CM

## 2021-06-17 DIAGNOSIS — Z7189 Other specified counseling: Secondary | ICD-10-CM

## 2021-06-17 DIAGNOSIS — S301XXA Contusion of abdominal wall, initial encounter: Secondary | ICD-10-CM

## 2021-06-17 MED ORDER — AMOXICILLIN-POT CLAVULANATE 875-125 MG PO TABS: 1.0000 | ORAL_TABLET | Freq: Two times a day (BID) | ORAL | 0 refills | Status: AC

## 2021-06-17 NOTE — Progress Notes (Signed)
Follow-up Note: Gyn-Onc  Consult was requested by Dr. Addison Bailey for the evaluation of Leah Diaz 60 y.o. female  CC:  Chief Complaint  Patient presents with   counseling and coordination of care   uterine sarcoma    Assessment/Plan:  Ms. Leah Diaz  is a 60 y.o.  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  1/ Uterine sarcoma. I discussed with Leah Diaz that her tumor is stage I and there are not data that support a survival advantage to adjuvant therapy for stage I sarcoma. However, her tumor exhibits several poor prognostic features including size, LVSI and high grade features, therefore she is at a high risk for recurrence.  We will first rule out metastases with a CT scan of the chest.  If negative, I would recommend consideration for hormonal therapy (eg megace or an AI) given the ER/PR positivity that her tumor expresses. Given how well hormonal therapy is tolerated, the toxicities of therapy are well balanced by the unclear role of adjuvant therapy in her situation.   She will require 3 monthly surveillance visits moving forward. Given that I am leaving the practice in October, she will need to follow-up with my partner, Dr Berline Lopes for these.   2/ wound seroma/hematoma: we evacuated the clot today. It is not a feasible wound for packing due to its location and narrow opening in the skin. There is no apparent cellulitis or active infection. I counseled the patient regarding symptoms of cellulitis and development of infection. I counseled the patient to contact us regarding these symptoms. I have prophylactically Rx'd augmentin x 10 days given the high risk for this wound/hematoma becoming co-infected.   HPI: Ms Leah Diaz is a 60 year old P2 who was seen in consultation at the request of Dr Addison Bailey for evaluation of a uterine mass.  The patient began having postmenopausal bleeding in  the week before Thanksgiving in November 2021.  She return to see her established OB/GYN office for a problem related visit for this new bleeding symptom.  She saw them in the week after Thanksgiving and at that time pelvic examination revealed a cervical polyp which was removed and found to be benign.  She was also scheduled to have a transvaginal ultrasound scan which was performed on October 24, 2020.  This revealed a uterus measuring 8.5 x 6.5 x 6.7 cm.  The endometrium was not definitively visualized.  There is an area of hyper echogenicity seen measuring 18 mm that could represent thickened endometrial complex apart of a heterogeneous central uterine mass that was seen.  A large heterogeneous masslike area was seen in the upper uterine segment centrally measuring 5.3 x 5.7 x 4.8 cm.  He contained internal blood flow on color Doppler.  They could represent either a degenerating myoma or an endometrial neoplasm.  She was taken to the operating room for hysteroscopy and D&C on 11/06/2020.  At that time Dr. Addison Bailey reported that she did not see an endometrial lesion.  Curettings revealed scant fragments of benign endometrial mucosa and extensive hemorrhage but no invasive malignancy or dysplasia was identified.  The patient had normal post D&C spotting until January 2022 then no further bleeding until May 2022 when the bleeding recommenced again.  At this time she return to see Dr. Addison Bailey.  At that time Dr. Addison Bailey ordered a second ultrasound scan to follow-up on the mass that had been seen previously.  This time on ultrasound on 05/08/2021 the uterus measured 11 x 7.7 x 7.5 cm.  Again a heterogeneous hypervascular mass was seen within the uterine corpus and fundus.  It measured 6.1 x 6.1 x 6.1 cm which had increased from its previous dimensions of 5.3 x 5.7 x 4.8 cm.  The endometrium was difficult to visualize.  The ovaries were not seen.  After seeing me for consultation, an MRI of the pelvis was performed  to better characterize the mass prior to surgery.  This was performed on 06/02/2021.  It demonstrated a heterogeneous 9.0 cm intrauterine mass within the intramural/submucosal anterior fundus demonstrating interval growth, internal enhancement, and restricted diffusion 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 No extra uterine extension. No evidence of metastatic disease within the pelvis.  Interval Hx:  On 06/03/2021 she underwent robotic assisted total hysterectomy for uterus greater than 250 g, BSO, mini laparotomy for intact specimen delivery. Intraoperative findings were significant for a bulky 16 cm uterus with an intrauterine mass consistent with a fibroid like structure, smooth normal-appearing uterine serosa, no suspicious bulky nodes in the retroperitoneum, grossly normal ovaries bilaterally.  Normal upper abdomen to visual inspection.  Uterus required mini laparotomy for delivery due to size and concern for occult malignancy. Surgery was uncomplicated, with frozen section revealing a sarcoma.  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  The tumor was 6 cm.  It demonstrated extensive lymphovascular space invasion.  It was a high-grade lesion.  Her malignancy was assigned stage Ib based on dimension greater than 5 cm with no gross evidence of extrauterine disease.  Since surgery she developed drainage of dark blood from her suprapubic minilap incision. She denied fevers or chills or pululent drainage.   Current Meds:  Outpatient Encounter Medications as of 06/17/2021  Medication Sig   amoxicillin-clavulanate (AUGMENTIN) 875-125 MG tablet Take 1 tablet by mouth 2 (two) times daily for 10 days.   aspirin 81 MG EC tablet Take by mouth every other day.   cetirizine (ZYRTEC) 10 MG tablet Take 10 mg by mouth daily.   Cholecalciferol (VITAMIN D3 PO) Take 1 tablet by mouth  daily.   fluticasone (FLONASE) 50 MCG/ACT nasal spray Place 2 sprays into both nostrils 2 (two) times daily as needed for allergies or rhinitis.   ibuprofen (ADVIL) 200 MG tablet Take 200 mg by mouth in the morning and at bedtime.   ibuprofen (ADVIL) 800 MG tablet Take 1 tablet (800 mg total) by mouth every 8 (eight) hours as needed for moderate pain. For AFTER surgery only   [START ON 06/18/2021] LORazepam (ATIVAN) 0.5 MG tablet Take 1 tablet (0.5 mg total) by mouth once for 1 dose. Take one hour before CT scan on 8/3 at 4 pm. Do not take and drive.   losartan-hydrochlorothiazide (HYZAAR) 100-25 MG tablet Take 1 tablet by mouth daily.   Na Sulfate-K Sulfate-Mg Sulf (SUPREP BOWEL PREP KIT) 17.5-3.13-1.6 GM/177ML SOLN Take 1 kit by mouth as directed.   omeprazole (PRILOSEC) 20 MG capsule Take 1 capsule (20 mg total) by mouth 2 (two) times daily. (Patient taking differently: Take 20 mg by mouth daily.)   senna-docusate (SENOKOT-S) 8.6-50 MG tablet Take 2 tablets by mouth at bedtime. For AFTER surgery, do not take if having diarrhea   traMADol (ULTRAM) 50 MG tablet Take 1 tablet (50 mg total) by mouth every 6 (six) hours as needed for severe pain. For AFTER surgery only,  do not take and drive   triamcinolone cream (KENALOG) 0.1 % Apply 1 application topically 2 (two) times daily as needed (psoriasis).   TURMERIC PO Take by mouth daily. (Patient not taking: No sig reported)   No facility-administered encounter medications on file as of 06/17/2021.    Allergy: No Known Allergies  Social Hx:   Social History   Socioeconomic History   Marital status: Married    Spouse name: Not on file   Number of children: Not on file   Years of education: Not on file   Highest education level: Not on file  Occupational History   Not on file  Tobacco Use   Smoking status: Never   Smokeless tobacco: Never  Vaping Use   Vaping Use: Never used  Substance and Sexual Activity   Alcohol use: No    Comment:  Occasional   Drug use: No   Sexual activity: Yes  Other Topics Concern   Not on file  Social History Narrative   Not on file   Social Determinants of Health   Financial Resource Strain: Not on file  Food Insecurity: Not on file  Transportation Needs: Not on file  Physical Activity: Not on file  Stress: Not on file  Social Connections: Not on file  Intimate Partner Violence: Not on file    Past Surgical Hx:  Past Surgical History:  Procedure Laterality Date   BREAST BIOPSY     x 2 benign   HYSTEROSCOPY  11/06/2020   ROBOTIC ASSISTED TOTAL HYSTERECTOMY WITH BILATERAL SALPINGO OOPHERECTOMY N/A 06/03/2021   Procedure: XI ROBOTIC ASSISTED TOTAL HYSTERECTOMY FOR UTERUS GREATER THAN 250G WITH BILATERAL SALPINGO OOPHORECTOMY;  Surgeon: Everitt Amber, MD;  Location: [facility];  Service: Gynecology;  Laterality: N/A;   TUBAL LIGATION  05/05/2011    Past Medical Hx:  Past Medical History:  Diagnosis Date   Anemia    Arthritis    Asthma    remote history as a teenager   GERD (gastroesophageal reflux disease)    Hypertension    Menopausal symptoms 05/05/2011   Migraines 05/05/2011   Seasonal allergies     Past Gynecological History: SVD x3 No LMP recorded. Patient is postmenopausal.  Family Hx:  Family History  Problem Relation Age of Onset   Colon cancer Maternal Uncle    Pancreatic cancer Maternal Grandmother     Review of Systems:  Constitutional  Feels well,    ENT Normal appearing ears and nares bilaterally Skin/Breast  No rash, sores, jaundice, itching, dryness Cardiovascular  No chest pain, shortness of breath, or edema  Pulmonary  No cough or wheeze.  Gastro Intestinal  No nausea, vomitting, or diarrhoea. No bright red blood per rectum, no abdominal pain, change in bowel movement, or constipation. + abdominal incision drainage Genito Urinary  No frequency, urgency, dysuria, no bleeding Musculo Skeletal  No myalgia, arthralgia, joint swelling or pain   Neurologic  No weakness, numbness, change in gait,  Psychology  No depression, anxiety, insomnia.   Vitals:  Blood pressure (!) 151/59, pulse 93, temperature 98.4 F (36.9 C), temperature source Oral, resp. rate 18, height 5' 8"  (1.727 m), weight 211 lb (95.7 kg), SpO2 98 %.  Physical Exam: WD in NAD Neck  Supple NROM, without any enlargements.  Lymph Node Survey No cervical supraclavicular or inguinal adenopathy Cardiovascular  Well perfused peripheries Lungs  No increased WOB Skin  No rash/lesions/breakdown  Psychiatry  Alert and oriented to person, place, and time  Abdomen  Normoactive  bowel sounds, abdomen soft, non-tender and obese without evidence of hernia. The suprapubic incision was well healed with the exception of a 1cm area in the mid incision that was open and draining old liquid blood. No surrounding erythema or cellulitis. There was clot and old blood evacuated with exploration with a cue tip. The cavity extended below the in-tact incision along the entire right aspect/half of the incision and 1/2 of the way along the left side of the incision.  Back No CVA tenderness Genito Urinary  Vulva/vagina: vaginal cuff healing with suture material present. In tact.  Rectal  deferred Extremities  No bilateral cyanosis, clubbing or edema.   30 minutes of direct face to face counseling time was spent with the patient. This included discussion about prognosis, therapy recommendations and postoperative side effects and are beyond the scope of routine postoperative care.   Thereasa Solo, MD  06/17/2021, 4:49 PM

## 2021-06-17 NOTE — Patient Instructions (Signed)
We will be in touch with you after tumor board next week to inform you of recommendations.

## 2021-06-18 ENCOUNTER — Ambulatory Visit (HOSPITAL_COMMUNITY)
Admission: RE | Admit: 2021-06-18 | Discharge: 2021-06-18 | Disposition: A | Discharge status: Home / Self Care | Payer: Managed Care, Other (non HMO) | Source: Ambulatory Visit | Attending: Gynecologic Oncology | Admitting: Gynecologic Oncology

## 2021-06-18 DIAGNOSIS — C549 Malignant neoplasm of corpus uteri, unspecified: Secondary | ICD-10-CM

## 2021-06-18 MED ORDER — IOHEXOL 350 MG/ML SOLN
60.0000 mL | Freq: Once | INTRAVENOUS | Status: AC | PRN
  Administered 2021-06-18: 60 mL via INTRAVENOUS

## 2021-06-18 MED ORDER — IOHEXOL 9 MG/ML PO SOLN
ORAL | Status: AC
  Filled 2021-06-18: qty 1000

## 2021-06-20 ENCOUNTER — Telehealth: Payer: Self-pay

## 2021-06-20 NOTE — Telephone Encounter (Signed)
Told Leah Diaz that the CT needs to be reviewed by a provider.  Will have the report on Leah Cross,NP's desk to be reviewed Monday.  She is aware that you called for the results. Pt appreciated the call and verbalized understanding.

## 2021-06-23 ENCOUNTER — Other Ambulatory Visit: Payer: Self-pay | Admitting: Oncology

## 2021-06-23 ENCOUNTER — Telehealth: Payer: Self-pay | Admitting: Oncology

## 2021-06-23 DIAGNOSIS — C549 Malignant neoplasm of corpus uteri, unspecified: Secondary | ICD-10-CM

## 2021-06-23 DIAGNOSIS — K769 Liver disease, unspecified: Secondary | ICD-10-CM

## 2021-06-23 NOTE — Progress Notes (Signed)
Gynecologic Oncology Multi-Disciplinary Disposition Conference Note  Date of the Conference: 06/23/2021  Patient Name: Leah Diaz  Referring Provider: Dr. Addison Bailey Primary GYN Oncologist: Dr. Denman George  Stage/Disposition:  Stage IB sarcoma of the uterus. Disposition is to MRI liver then adjuvant therapy with antiestrogen therapy - Megace - for 3 months. Also repeat CT chest in 3 months.   This Multidisciplinary conference took place involving physicians from [place], [place], Radiation Oncology, Pathology, Radiology along with the Gynecologic Oncology Nurse Practitioner and RN.  Comprehensive assessment of the patient's malignancy, staging, need for surgery, chemotherapy, radiation therapy, and need for further testing were reviewed. Supportive measures, both inpatient and following discharge were also discussed. The recommended plan of care is documented. Greater than 35 minutes were spent correlating and coordinating this patient's care.

## 2021-06-23 NOTE — Telephone Encounter (Signed)
Called Leah Diaz and advised her of the results from her CT chest. Discussed that we will repeat imaging in 3 months to follow up on the pulmonary nodules and that we will schedule and MRI liver to get a closer look at the liver lesion.  Also discussed tumor board recommendations from this morning for adjuvant therapy with Megace followed by repeat CT chest in 3 months.    She verbalized understanding and said that she had trouble with her last MRI because she is very claustrophobic.  She said the ativan did not really help and is wondering if there is anything else she can take before the MRI liver.  Advised that I will discuss with Joylene John, NP and call her back.

## 2021-06-23 NOTE — Telephone Encounter (Signed)
Called Leah Diaz back and advised her of scheduled CT Chest for 09/18/21 at 9:00 (liquids only 4 hours before).  Also discussed Ativan for before her MRI.  She said it helped a little bit and the only side effect she had was a tiny bit of drowsiness.  She is willing to try either Xanax or an increased does of Ativan.  Her husband will be able to drive her to the MRI.

## 2021-06-24 ENCOUNTER — Other Ambulatory Visit: Payer: Self-pay | Admitting: Gynecologic Oncology

## 2021-06-24 DIAGNOSIS — F40240 Claustrophobia: Secondary | ICD-10-CM

## 2021-06-24 MED ORDER — LORAZEPAM 1 MG PO TABS: 1.0000 mg | ORAL_TABLET | Freq: Once | ORAL | 0 refills | Status: AC

## 2021-06-24 NOTE — Progress Notes (Signed)
Ativan ordered to be administered one hour before her MRI. With previous scan, patient had 0.5 mg tablet and states it did not help much. For her upcoming MRI, dose has been increased to 1 mg with the precaution of no driving to and from her MRI.

## 2021-07-04 ENCOUNTER — Ambulatory Visit
Admission: RE | Admit: 2021-07-04 | Discharge: 2021-07-04 | Disposition: A | Discharge status: Home / Self Care | Payer: PRIVATE HEALTH INSURANCE | Source: Ambulatory Visit | Attending: Gynecologic Oncology | Admitting: Gynecologic Oncology

## 2021-07-04 ENCOUNTER — Other Ambulatory Visit: Payer: Self-pay

## 2021-07-04 DIAGNOSIS — K769 Liver disease, unspecified: Secondary | ICD-10-CM

## 2021-07-04 MED ORDER — GADOBENATE DIMEGLUMINE 529 MG/ML IV SOLN
20.0000 mL | Freq: Once | INTRAVENOUS | Status: AC | PRN
  Administered 2021-07-04: 20 mL via INTRAVENOUS

## 2021-07-07 ENCOUNTER — Telehealth: Payer: Self-pay

## 2021-07-07 NOTE — Telephone Encounter (Signed)
Left message requesting a return call to review recent scan results.

## 2021-07-08 NOTE — Telephone Encounter (Signed)
Spoke with [name] regarding her MR Liver scan. Per Joylene John, NP scan shows benign cyst in the liver. No suspicious lesions in the liver. It is showing a fatty liver. Instructed patient to follow up with her PCP for this. Patient verbalized understanding and happy with results. Will call with any questions or concerns.

## 2021-07-30 ENCOUNTER — Telehealth: Payer: Self-pay | Admitting: *Deleted

## 2021-07-30 ENCOUNTER — Telehealth: Payer: Self-pay

## 2021-07-30 DIAGNOSIS — C549 Malignant neoplasm of corpus uteri, unspecified: Secondary | ICD-10-CM

## 2021-07-30 MED ORDER — MEGESTROL ACETATE 40 MG PO TABS
40.0000 mg | ORAL_TABLET | Freq: Two times a day (BID) | ORAL | 3 refills | Status: DC
Start: 2021-07-30 — End: 2021-10-23

## 2021-07-30 NOTE — Telephone Encounter (Signed)
Pt callled back to r/s 09-30-21 appointment as she has another appointment in [place] that afternoon. Pt r/s to 10-13-21.

## 2021-07-30 NOTE — Telephone Encounter (Signed)
Returning call to Leah Diaz.  She is inquiring when she can start her hormone therapy and if she needs to come into the office prior to this being prescribed. Per Leah John, NP she does not need to be seen prior to starting hormone therapy. Leah Diaz '40mg'$  BID will be sent to her pharmacy. Follow up appointment has been scheduled with Dr. Berline Lopes for 09/30/21 at 3:30pm. Instructed patient to monitor for blood clots once she begins Leah Diaz. Patient verbalized understanding. Instructed her to call with questions or concerns.

## 2021-07-30 NOTE — Telephone Encounter (Signed)
Attempted to return the patient's call , left a message to call the office back

## 2021-09-09 ENCOUNTER — Encounter (INDEPENDENT_AMBULATORY_CARE_PROVIDER_SITE_OTHER): Payer: Self-pay

## 2021-09-09 ENCOUNTER — Other Ambulatory Visit (INDEPENDENT_AMBULATORY_CARE_PROVIDER_SITE_OTHER): Payer: Self-pay

## 2021-09-09 DIAGNOSIS — Z01812 Encounter for preprocedural laboratory examination: Secondary | ICD-10-CM

## 2021-09-09 DIAGNOSIS — Z1211 Encounter for screening for malignant neoplasm of colon: Secondary | ICD-10-CM

## 2021-09-15 ENCOUNTER — Other Ambulatory Visit (HOSPITAL_COMMUNITY)
Admission: RE | Admit: 2021-09-15 | Discharge: 2021-09-15 | Disposition: A | Discharge status: Home / Self Care | Payer: Managed Care, Other (non HMO) | Source: Ambulatory Visit | Attending: Internal Medicine | Admitting: Internal Medicine

## 2021-09-15 ENCOUNTER — Other Ambulatory Visit (INDEPENDENT_AMBULATORY_CARE_PROVIDER_SITE_OTHER): Payer: Self-pay

## 2021-09-15 DIAGNOSIS — I10 Essential (primary) hypertension: Secondary | ICD-10-CM

## 2021-09-15 DIAGNOSIS — Z01812 Encounter for preprocedural laboratory examination: Secondary | ICD-10-CM | POA: Insufficient documentation

## 2021-09-15 LAB — BASIC METABOLIC PANEL
Anion gap: 8 (ref 5–15)
BUN: 15 mg/dL (ref 8–23)
CO2: 25 mmol/L (ref 22–32)
Calcium: 9.2 mg/dL (ref 8.9–10.3)
Chloride: 103 mmol/L (ref 98–111)
Creatinine, Ser: 0.73 mg/dL (ref 0.44–1.00)
GFR, Estimated: >60 mL/min (ref >60)
Glucose, Bld: 107 mg/dL — ABNORMAL HIGH (ref 70–99)
Potassium: 3.7 mmol/L (ref 3.5–5.1)
Sodium: 136 mmol/L (ref 135–145)

## 2021-09-17 ENCOUNTER — Telehealth: Payer: Self-pay | Admitting: *Deleted

## 2021-09-17 NOTE — Telephone Encounter (Signed)
Reschedule appt from 11/28 to 11/16

## 2021-09-18 ENCOUNTER — Encounter (HOSPITAL_COMMUNITY)
Admission: RE | Disposition: A | Discharge status: Home / Self Care | Payer: Self-pay | Source: Home / Self Care | Attending: Internal Medicine

## 2021-09-18 ENCOUNTER — Other Ambulatory Visit: Payer: Self-pay

## 2021-09-18 ENCOUNTER — Ambulatory Visit (HOSPITAL_COMMUNITY)
Admission: RE | Admit: 2021-09-18 | Discharge: 2021-09-18 | Disposition: A | Discharge status: Home / Self Care | Payer: Managed Care, Other (non HMO) | Attending: Internal Medicine | Admitting: Internal Medicine

## 2021-09-18 ENCOUNTER — Encounter (INDEPENDENT_AMBULATORY_CARE_PROVIDER_SITE_OTHER): Payer: Self-pay | Admitting: *Deleted

## 2021-09-18 ENCOUNTER — Ambulatory Visit (HOSPITAL_COMMUNITY): Payer: Managed Care, Other (non HMO)

## 2021-09-18 ENCOUNTER — Encounter (HOSPITAL_COMMUNITY): Payer: Self-pay | Admitting: Internal Medicine

## 2021-09-18 DIAGNOSIS — Z7982 Long term (current) use of aspirin: Secondary | ICD-10-CM | POA: Insufficient documentation

## 2021-09-18 DIAGNOSIS — I10 Essential (primary) hypertension: Secondary | ICD-10-CM | POA: Diagnosis not present

## 2021-09-18 DIAGNOSIS — K644 Residual hemorrhoidal skin tags: Secondary | ICD-10-CM | POA: Diagnosis not present

## 2021-09-18 DIAGNOSIS — K6289 Other specified diseases of anus and rectum: Secondary | ICD-10-CM | POA: Diagnosis not present

## 2021-09-18 DIAGNOSIS — K5730 Diverticulosis of large intestine without perforation or abscess without bleeding: Secondary | ICD-10-CM | POA: Diagnosis not present

## 2021-09-18 DIAGNOSIS — Z800 Family history of malignant neoplasm of digestive organs: Secondary | ICD-10-CM | POA: Diagnosis not present

## 2021-09-18 DIAGNOSIS — Z1211 Encounter for screening for malignant neoplasm of colon: Secondary | ICD-10-CM | POA: Diagnosis not present

## 2021-09-18 DIAGNOSIS — K219 Gastro-esophageal reflux disease without esophagitis: Secondary | ICD-10-CM | POA: Diagnosis not present

## 2021-09-18 DIAGNOSIS — Z79899 Other long term (current) drug therapy: Secondary | ICD-10-CM | POA: Insufficient documentation

## 2021-09-18 DIAGNOSIS — Z86010 Personal history of colonic polyps: Secondary | ICD-10-CM | POA: Diagnosis not present

## 2021-09-18 DIAGNOSIS — Z09 Encounter for follow-up examination after completed treatment for conditions other than malignant neoplasm: Secondary | ICD-10-CM | POA: Diagnosis not present

## 2021-09-18 HISTORY — PX: COLONOSCOPY: SHX5424

## 2021-09-18 SURGERY — COLONOSCOPY
Anesthesia: Moderate Sedation

## 2021-09-18 MED ORDER — SODIUM CHLORIDE 0.9 % IV SOLN
INTRAVENOUS | Status: DC
  Administered 2021-09-18: 1000 mL via INTRAVENOUS

## 2021-09-18 MED ORDER — MEPERIDINE HCL 50 MG/ML IJ SOLN
INTRAMUSCULAR | Status: DC | PRN
  Administered 2021-09-18: 15 mg via INTRAVENOUS
  Administered 2021-09-18: 25 mg via INTRAVENOUS
  Administered 2021-09-18: 10 mg via INTRAVENOUS
  Administered 2021-09-18: 25 mg via INTRAVENOUS

## 2021-09-18 MED ORDER — MIDAZOLAM HCL 5 MG/5ML IJ SOLN
INTRAMUSCULAR | Status: DC | PRN
  Administered 2021-09-18 (×3): 2 mg via INTRAVENOUS
  Administered 2021-09-18: 1 mg via INTRAVENOUS

## 2021-09-18 MED ORDER — MEPERIDINE HCL 50 MG/ML IJ SOLN
INTRAMUSCULAR | Status: AC
  Filled 2021-09-18: qty 1

## 2021-09-18 MED ORDER — MIDAZOLAM HCL 5 MG/5ML IJ SOLN
INTRAMUSCULAR | Status: AC
  Filled 2021-09-18: qty 10

## 2021-09-18 NOTE — Discharge Instructions (Addendum)
Resume usual medications including aspirin as before High-fiber diet. No driving for 24 hours Next colonoscopy in 5 years.

## 2021-09-18 NOTE — Op Note (Signed)
[facility] Patient Name: Leah Diaz Procedure Date: 09/18/2021 7:07 AM MRN: [id number] Date of Birth: [birth year] Attending MD: Hildred Laser , MD CSN: [id number] Age: 60 Admit Type: Outpatient Procedure:                Colonoscopy Indications:              High risk colon cancer surveillance: Personal                            history of colonic polyps Providers:                Hildred Laser, MD, Tammy Vaught, RN, Kristine L.                            Risa Grill, Technician Referring MD:             Curlene Labrum, MD Medicines:                Meperidine 75 mg IV, Midazolam 7 mg IV Complications:            No immediate complications. Estimated Blood Loss:     Estimated blood loss: none. Procedure:                Pre-Anesthesia Assessment:                           - Prior to the procedure, a History and Physical                            was performed, and patient medications and                            allergies were reviewed. The patient's tolerance of                            previous anesthesia was also reviewed. The risks                            and benefits of the procedure and the sedation                            options and risks were discussed with the patient.                            All questions were answered, and informed consent                            was obtained. Prior Anticoagulants: The patient has                            taken no previous anticoagulant or antiplatelet                            agents except for aspirin and has taken no previous  anticoagulant or antiplatelet agents except for                            NSAID medication. ASA Grade Assessment: II - A                            patient with mild systemic disease. After reviewing                            the risks and benefits, the patient was deemed in                            satisfactory condition to undergo the procedure.                            After obtaining informed consent, the colonoscope                            was passed under direct vision. Throughout the                            procedure, the patient's blood pressure, pulse, and                            oxygen saturations were monitored continuously. The                            [id number]-HQ190L ([id number]) scope was introduced through                            the anus and advanced to the the cecum, identified                            by appendiceal orifice and ileocecal valve. The                            colonoscopy was performed without difficulty. The                            patient tolerated the procedure well. The quality                            of the bowel preparation was excellent. The                            ileocecal valve, appendiceal orifice, and rectum                            were photographed. Scope In: 7:47:30 AM Scope Out: 8:08:39 AM Scope Withdrawal Time: 0 hours 12 minutes 27 seconds  Total Procedure Duration: 0 hours 21 minutes 9 seconds  Findings:      The perianal and digital rectal examinations were normal.      A single small-mouthed diverticulum was found in the sigmoid  colon.      The exam was otherwise normal throughout the examined colon.      External hemorrhoids were found during retroflexion. The hemorrhoids       were small.      Anal papilla(e) were hypertrophied. Impression:               - Diverticulosis in the sigmoid colon.                           - External hemorrhoids.                           - Anal papilla(e) were hypertrophied.                           - No specimens collected. Moderate Sedation:      Moderate (conscious) sedation was administered by the endoscopy nurse       and supervised by the endoscopist. The following parameters were       monitored: oxygen saturation, heart rate, blood pressure, CO2       capnography and response to care. Total physician intraservice time was        29 minutes. Recommendation:           - Patient has a contact number available for                            emergencies. The signs and symptoms of potential                            delayed complications were discussed with the                            patient. Return to normal activities tomorrow.                            Written discharge instructions were provided to the                            patient.                           - High fiber diet today.                           - Continue present medications.                           - Repeat colonoscopy in 5 years for surveillance. Procedure Code(s):        --- Professional ---                           [phone], Colonoscopy, flexible; diagnostic, including                            collection of specimen(s) by brushing or washing,  when performed (separate procedure)                           [id number], Moderate sedation; each additional 15                            minutes intraservice time                           G0500, Moderate sedation services provided by the                            same physician or other qualified health care                            professional performing a gastrointestinal                            endoscopic service that sedation supports,                            requiring the presence of an independent trained                            observer to assist in the monitoring of the                            patient's level of consciousness and physiological                            status; initial 15 minutes of intra-service time;                            patient age 36 years or older (additional time may                            be reported with [phone], as appropriate) Diagnosis Code(s):        --- Professional ---                           Z86.010, Personal history of colonic polyps                           K64.4, Residual hemorrhoidal skin tags                            K62.89, Other specified diseases of anus and rectum                           K57.30, Diverticulosis of large intestine without                            perforation or abscess without bleeding CPT copyright 2019 [employer]. All rights reserved. The codes documented in this report are preliminary and upon coder review may  be revised  to meet current compliance requirements. Hildred Laser, MD Hildred Laser, MD 09/18/2021 8:23:32 AM This report has been signed electronically. Number of Addenda: 0

## 2021-09-18 NOTE — H&P (Addendum)
Leah Diaz is an 60 y.o. female.   Chief Complaint: Patient is here for colonoscopy HPI: Patient is 60 year old Caucasian female who has a history of colonic adenomas and is here for surveillance colonoscopy.  She was supposed to have colonoscopy in 2017 but was not done because of scheduling issues.  Her last exam was in 2012.  She denies abdominal pain change in bowel habits or rectal bleeding. Aspirin is on hold. She states her maternal grandfather had colon cancer in his 47s and died soon thereafter.  Her second cousin also had colon cancer at age 52 and is doing fine.  Past Medical History:  Diagnosis Date   Anemia    Arthritis    Asthma    remote history as a teenager   GERD (gastroesophageal reflux disease)    Hypertension    Menopausal symptoms 05/05/2011   Migraines 05/05/2011   Seasonal allergies     Past Surgical History:  Procedure Laterality Date   ABDOMINAL HYSTERECTOMY     BREAST BIOPSY     x 2 benign   HYSTEROSCOPY  11/06/2020   ROBOTIC ASSISTED TOTAL HYSTERECTOMY WITH BILATERAL SALPINGO OOPHERECTOMY N/A 06/03/2021   Procedure: XI ROBOTIC ASSISTED TOTAL HYSTERECTOMY FOR UTERUS GREATER THAN 250G WITH BILATERAL SALPINGO OOPHORECTOMY;  Surgeon: Everitt Amber, MD;  Location: [facility];  Service: Gynecology;  Laterality: N/A;   TUBAL LIGATION  05/05/2011    Family History  Problem Relation Age of Onset   Colon cancer Maternal Uncle    Pancreatic cancer Maternal Grandmother    Social History:  reports that she has never smoked. She has never used smokeless tobacco. She reports that she does not drink alcohol and does not use drugs.  Allergies: No Known Allergies  Medications Prior to Admission  Medication Sig Dispense Refill   aspirin 81 MG EC tablet Take by mouth every other day.     cetirizine (ZYRTEC) 10 MG tablet Take 10 mg by mouth daily.     Cholecalciferol (VITAMIN D3 PO) Take 1 tablet by mouth daily.     fluticasone (FLONASE) 50 MCG/ACT nasal spray Place  2 sprays into both nostrils 2 (two) times daily as needed for allergies or rhinitis.     ibuprofen (ADVIL) 200 MG tablet Take 200 mg by mouth in the morning and at bedtime.     losartan-hydrochlorothiazide (HYZAAR) 100-25 MG tablet Take 1 tablet by mouth daily.     megestrol (MEGACE) 40 MG tablet Take 1 tablet (40 mg total) by mouth 2 (two) times daily. 60 tablet 3   Na Sulfate-K Sulfate-Mg Sulf (SUPREP BOWEL PREP KIT) 17.5-3.13-1.6 GM/177ML SOLN Take 1 kit by mouth as directed. 354 mL 0   omeprazole (PRILOSEC) 20 MG capsule Take 1 capsule (20 mg total) by mouth 2 (two) times daily. (Patient taking differently: Take 20 mg by mouth daily.) 28 capsule 0   triamcinolone cream (KENALOG) 0.1 % Apply 1 application topically 2 (two) times daily as needed (psoriasis).     ibuprofen (ADVIL) 800 MG tablet Take 1 tablet (800 mg total) by mouth every 8 (eight) hours as needed for moderate pain. For AFTER surgery only 30 tablet 0   senna-docusate (SENOKOT-S) 8.6-50 MG tablet Take 2 tablets by mouth at bedtime. For AFTER surgery, do not take if having diarrhea 30 tablet 0   traMADol (ULTRAM) 50 MG tablet Take 1 tablet (50 mg total) by mouth every 6 (six) hours as needed for severe pain. For AFTER surgery only, do not take and drive  10 tablet 0   TURMERIC PO Take by mouth daily. (Patient not taking: No sig reported)      No results found for this or any previous visit (from the past 48 hour(s)). No results found.  Review of Systems  Blood pressure 136/80, pulse 86, temperature 98.2 F (36.8 C), temperature source Oral, resp. rate 17, height _0  (1.727 m), weight 94.8 kg, SpO2 98 %. Physical Exam HENT:     Mouth/Throat:     Mouth: Mucous membranes are moist.     Pharynx: Oropharynx is clear.  Eyes:     General: No scleral icterus.    Conjunctiva/sclera: Conjunctivae normal.  Cardiovascular:     Rate and Rhythm: Normal rate and regular rhythm.     Heart sounds: Normal heart sounds. No murmur  heard. Pulmonary:     Effort: Pulmonary effort is normal.     Breath sounds: Normal breath sounds.  Abdominal:     General: There is no distension.     Palpations: Abdomen is soft. There is no mass.     Tenderness: There is no abdominal tenderness.  Musculoskeletal:        General: No swelling.     Cervical back: Neck supple.  Lymphadenopathy:     Cervical: No cervical adenopathy.  Skin:    General: Skin is warm and dry.  Neurological:     Mental Status: She is alert.     Assessment/Plan  History of colonic adenomas Surveillance colonoscopy  Hildred Laser, MD 09/18/2021, 7:31 AM

## 2021-09-19 ENCOUNTER — Ambulatory Visit (HOSPITAL_COMMUNITY)
Admission: RE | Admit: 2021-09-19 | Discharge: 2021-09-19 | Disposition: A | Discharge status: Home / Self Care | Payer: Managed Care, Other (non HMO) | Source: Ambulatory Visit | Attending: Gynecologic Oncology | Admitting: Gynecologic Oncology

## 2021-09-19 ENCOUNTER — Telehealth: Payer: Self-pay

## 2021-09-19 DIAGNOSIS — C549 Malignant neoplasm of corpus uteri, unspecified: Secondary | ICD-10-CM | POA: Diagnosis not present

## 2021-09-19 MED ORDER — IOHEXOL 350 MG/ML SOLN
60.0000 mL | Freq: Once | INTRAVENOUS | Status: AC | PRN
  Administered 2021-09-19: 60 mL via INTRAVENOUS

## 2021-09-19 NOTE — Telephone Encounter (Signed)
Told Ms Leah Diaz that the areas in the lungs have been stable over the past 3 months.  The radiologist feels the areas are lymph nodes.  F/u as planned in November with Dr. Berline Lopes per Zoila Shutter. Pt verbalized understanding.

## 2021-09-22 ENCOUNTER — Ambulatory Visit: Payer: PRIVATE HEALTH INSURANCE | Admitting: Gynecologic Oncology

## 2021-09-23 ENCOUNTER — Encounter (HOSPITAL_COMMUNITY): Payer: Self-pay | Admitting: Internal Medicine

## 2021-09-29 ENCOUNTER — Encounter: Payer: Self-pay | Admitting: Gynecologic Oncology

## 2021-09-30 ENCOUNTER — Ambulatory Visit: Payer: PRIVATE HEALTH INSURANCE | Admitting: Gynecologic Oncology

## 2021-10-01 ENCOUNTER — Encounter: Payer: Self-pay | Admitting: Gynecologic Oncology

## 2021-10-01 ENCOUNTER — Other Ambulatory Visit: Payer: Self-pay

## 2021-10-01 ENCOUNTER — Inpatient Hospital Stay: Payer: Managed Care, Other (non HMO) | Attending: Gynecologic Oncology | Admitting: Gynecologic Oncology

## 2021-10-01 VITALS — BP 132/76 | HR 82 | Temp 98.5°F | Resp 17 | Wt 211.2 lb

## 2021-10-01 DIAGNOSIS — Z79818 Long term (current) use of other agents affecting estrogen receptors and estrogen levels: Secondary | ICD-10-CM | POA: Insufficient documentation

## 2021-10-01 DIAGNOSIS — R918 Other nonspecific abnormal finding of lung field: Secondary | ICD-10-CM | POA: Diagnosis not present

## 2021-10-01 DIAGNOSIS — I10 Essential (primary) hypertension: Secondary | ICD-10-CM | POA: Insufficient documentation

## 2021-10-01 DIAGNOSIS — Z90722 Acquired absence of ovaries, bilateral: Secondary | ICD-10-CM | POA: Diagnosis not present

## 2021-10-01 DIAGNOSIS — Z90710 Acquired absence of both cervix and uterus: Secondary | ICD-10-CM | POA: Insufficient documentation

## 2021-10-01 DIAGNOSIS — R32 Unspecified urinary incontinence: Secondary | ICD-10-CM | POA: Diagnosis not present

## 2021-10-01 DIAGNOSIS — C55 Malignant neoplasm of uterus, part unspecified: Secondary | ICD-10-CM | POA: Diagnosis present

## 2021-10-01 DIAGNOSIS — C549 Malignant neoplasm of corpus uteri, unspecified: Secondary | ICD-10-CM

## 2021-10-01 DIAGNOSIS — K219 Gastro-esophageal reflux disease without esophagitis: Secondary | ICD-10-CM | POA: Diagnosis not present

## 2021-10-01 NOTE — Patient Instructions (Signed)
It was great to meet you today.  I will release the results from your biopsy today when they are back, likely tomorrow or Friday.  As long as these show scar tissue, as I suspect it well, then I will plan to see you in 3 months for follow-up.  I will also release your CT results back once I see them.  If you develop any new or concerning symptoms before your next visit with me, please call the clinic to see me sooner.

## 2021-10-01 NOTE — Progress Notes (Signed)
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  She return to see her established OB/GYN office for a problem related visit for this new bleeding symptom.  She saw them in the week after Thanksgiving and at that time pelvic examination revealed a cervical polyp which was removed and found to be benign.  She was also scheduled to have a transvaginal ultrasound scan which was performed on October 24, 2020.  This revealed a uterus measuring 8.5 x 6.5 x 6.7 cm.  The endometrium was not definitively visualized.  There is an area of hyper echogenicity seen measuring 18 mm that could represent thickened endometrial complex apart of a heterogeneous central uterine mass that was seen.  A large heterogeneous masslike area was seen in the upper uterine segment centrally measuring 5.3 x 5.7 x 4.8 cm.  He contained internal blood flow on color Doppler.  They could represent either a degenerating myoma or an endometrial neoplasm.  She was taken to the operating room for hysteroscopy and D&C on 11/06/2020.  At that time Dr. Addison Bailey reported that she did not see an endometrial lesion.  Curettings revealed scant fragments of benign endometrial mucosa and extensive hemorrhage but no invasive malignancy or dysplasia was identified.  The patient had normal post D&C spotting until January 2022 then no further bleeding until May 2022 when the bleeding recommenced again.  At this time she return to see Dr. Addison Bailey.  At that time Dr. Addison Bailey ordered a second ultrasound scan to follow-up on the mass that had been seen previously.  This time on ultrasound on 05/08/2021 the uterus measured 11 x 7.7 x 7.5 cm.  Again a heterogeneous hypervascular mass was seen within the uterine corpus and fundus.  It measured 6.1 x 6.1 x 6.1 cm  which had increased from its previous dimensions of 5.3 x 5.7 x 4.8 cm.  The endometrium was difficult to visualize.  The ovaries were not seen.   After seeing me for consultation, an MRI of the pelvis was performed to better characterize the mass prior to surgery.  This was performed on 06/02/2021.  It demonstrated a heterogeneous 9.0 cm intrauterine mass within the intramural/submucosal anterior fundus demonstrating interval growth, internal enhancement, and restricted diffusion 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 No extra uterine extension. No evidence of metastatic disease within the pelvis.  On 06/03/2021 she underwent robotic assisted total hysterectomy for uterus greater than 250 g, BSO, mini laparotomy for intact specimen delivery. Intraoperative findings were significant for a bulky 16 cm uterus with an intrauterine mass consistent with a fibroid like structure, smooth normal-appearing uterine serosa, no suspicious bulky nodes in the retroperitoneum, grossly normal ovaries bilaterally.  Normal upper abdomen to visual inspection.  Uterus required mini laparotomy for delivery due to size and concern for occult malignancy. Surgery was uncomplicated, with frozen section revealing a sarcoma.   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  The tumor was 6 cm.  It demonstrated extensive lymphovascular space invasion.  It was a high-grade lesion.  Her malignancy was assigned stage Ib based on dimension greater than 5 cm with no gross evidence of extrauterine disease.   Since surgery she developed drainage of dark blood from her suprapubic minilap incision. She denied fevers or  chills or pululent drainage.  After tumor board discussion as well as discussion with the patient, decision was made to proceed with adjuvant hormonal therapy with Megace.  Interval History: Patient  reports overall doing well since her last visit.  She denies any vaginal bleeding or discharge.  She denies any abdominal or pelvic pain.  She endorses normal bowel and bladder function.  She is tolerating the Megace without significant symptoms although thinks that she has noted some appetite increase.  She denies any weight changes.  She has occasional vaginal pruritus, which is at baseline for her.  She thinks this is related to always wearing a pad for her urinary incontinence.  She had a colonoscopy 2 weeks ago without significant findings.  She notes some mild shortness of breath with ambulation.  Past Medical/Surgical History: Past Medical History:  Diagnosis Date   Anemia    Arthritis    Asthma    remote history as a teenager   GERD (gastroesophageal reflux disease)    Hypertension    Menopausal symptoms 05/05/2011   Migraines 05/05/2011   Seasonal allergies     Past Surgical History:  Procedure Laterality Date   ABDOMINAL HYSTERECTOMY     BREAST BIOPSY     x 2 benign   COLONOSCOPY N/A 09/18/2021   Procedure: COLONOSCOPY;  Surgeon: Rogene Houston, MD;  Location: [facility];  Service: Endoscopy;  Laterality: N/A;  8:05   HYSTEROSCOPY  11/06/2020   ROBOTIC ASSISTED TOTAL HYSTERECTOMY WITH BILATERAL SALPINGO OOPHERECTOMY N/A 06/03/2021   Procedure: XI ROBOTIC ASSISTED TOTAL HYSTERECTOMY FOR UTERUS GREATER THAN 250G WITH BILATERAL SALPINGO OOPHORECTOMY;  Surgeon: Everitt Amber, MD;  Location: [facility];  Service: Gynecology;  Laterality: N/A;   TUBAL LIGATION  05/05/2011    Family History  Problem Relation Age of Onset   Colon cancer Maternal Uncle    Pancreatic cancer Maternal Grandmother     Social History   Socioeconomic History   Marital status: Married    Spouse name: Not on file   Number of children: Not on file   Years of education: Not on file   Highest education level: Not on file  Occupational History   Not on file  Tobacco Use   Smoking status: Never    Smokeless tobacco: Never  Vaping Use   Vaping Use: Never used  Substance and Sexual Activity   Alcohol use: No    Comment: Occasional   Drug use: No   Sexual activity: Yes  Other Topics Concern   Not on file  Social History Narrative   Not on file   Social Determinants of Health   Financial Resource Strain: Not on file  Food Insecurity: Not on file  Transportation Needs: Not on file  Physical Activity: Not on file  Stress: Not on file  Social Connections: Not on file    Current Medications:  Current Outpatient Medications:    aspirin 81 MG EC tablet, Take by mouth every other day., Disp: , Rfl:    cetirizine (ZYRTEC) 10 MG tablet, Take 10 mg by mouth daily., Disp: , Rfl:    Cholecalciferol (VITAMIN D3 PO), Take 1 tablet by mouth daily., Disp: , Rfl:    fluticasone (FLONASE) 50 MCG/ACT nasal spray, Place 2 sprays into both nostrils 2 (two) times daily as needed for allergies or rhinitis., Disp: , Rfl:    ibuprofen (ADVIL) 200 MG tablet, Take 200 mg by mouth in the morning and at bedtime., Disp: , Rfl:  losartan-hydrochlorothiazide (HYZAAR) 100-25 MG tablet, Take 1 tablet by mouth daily., Disp: , Rfl:    megestrol (MEGACE) 40 MG tablet, Take 1 tablet (40 mg total) by mouth 2 (two) times daily., Disp: 60 tablet, Rfl: 3   triamcinolone cream (KENALOG) 0.1 %, Apply 1 application topically 2 (two) times daily as needed (psoriasis)., Disp: , Rfl:    Turmeric (QC TUMERIC COMPLEX PO), Take 1,500 mg by mouth. Daily, Disp: , Rfl:    ibuprofen (ADVIL) 800 MG tablet, Take 1 tablet (800 mg total) by mouth every 8 (eight) hours as needed for moderate pain. For AFTER surgery only (Patient not taking: Reported on 09/29/2021), Disp: 30 tablet, Rfl: 0   omeprazole (PRILOSEC) 20 MG capsule, Take 1 capsule (20 mg total) by mouth 2 (two) times daily., Disp: 28 capsule, Rfl: 0   senna-docusate (SENOKOT-S) 8.6-50 MG tablet, Take 2 tablets by mouth at bedtime. For AFTER surgery, do not take if having  diarrhea (Patient not taking: Reported on 09/29/2021), Disp: 30 tablet, Rfl: 0   traMADol (ULTRAM) 50 MG tablet, Take 1 tablet (50 mg total) by mouth every 6 (six) hours as needed for severe pain. For AFTER surgery only, do not take and drive (Patient not taking: Reported on 09/29/2021), Disp: 10 tablet, Rfl: 0  Review of Systems: Pertinent positives as per HPI. Denies appetite changes, fevers, chills, fatigue, unexplained weight changes. Denies hearing loss, neck lumps or masses, mouth sores, ringing in ears or voice changes. Denies cough or wheezing.   Denies chest pain or palpitations. Denies leg swelling. Denies abdominal distention, pain, blood in stools, constipation, diarrhea, nausea, vomiting, or early satiety. Denies pain with intercourse, dysuria, frequency, hematuria or incontinence. Denies hot flashes, pelvic pain, vaginal bleeding or vaginal discharge.   Denies joint pain, back pain or muscle pain/cramps. Denies rash, or wounds. Denies dizziness, headaches, numbness or seizures. Denies swollen lymph nodes or glands, denies easy bruising or bleeding. Denies anxiety, depression, confusion, or decreased concentration.  Physical Exam: BP 132/76 (BP Location: Right Arm, Patient Position: Sitting)   Pulse 82   Temp 98.5 F (36.9 C) (Tympanic)   Resp 17   Wt 211 lb 4 oz (95.8 kg)   SpO2 100%   BMI 32.12 kg/m  General: Alert, oriented, no acute distress. HEENT: Normocephalic, atraumatic, sclera anicteric. Chest: Clear to auscultation bilaterally.  No wheezes or rhonchi. Cardiovascular: Regular rate and rhythm, no murmurs. Abdomen: Obese, soft, nontender.  Normoactive bowel sounds.  No masses or hepatosplenomegaly appreciated.  Well-healed incisions. Extremities: Grossly normal range of motion.  Warm, well perfused.  No edema bilaterally. Skin: No rashes or lesions noted. Lymphatics: No cervical, supraclavicular, or inguinal adenopathy. GU: Normal appearing external genitalia  without erythema, excoriation, or lesions.  Speculum exam reveals mildly atrophic vaginal mucosa, no bleeding or discharge.  There is a 2 x 1 cm polypoid lesion along the midportion of the vaginal cuff, no atypical vascularity, suspected to be granulation tissue.  Bimanual exam reveals no nodularity, polypoid lesion felt but not firm.  No significant findings on rectovaginal exam, no nodularity or masses.  Vaginal cuff biopsy procedure Preoperative diagnosis: Vaginal cuff lesion Postoperative diagnosis: Same as above Physician: Leah Lopes MD Specimen: Vaginal cuff lesion Estimated blood loss: Minimal Procedure: After the procedure was discussed with the patient including risks and benefits, she gave verbal consent.  She was then placed in dorsal lithotomy position and a speculum was placed in the vagina.  The vaginal cuff was cleansed with Betadine x3.  Tischler forceps  were then used to take a biopsy of the polypoid lesion emanating from the vaginal cuff.  This was placed in formalin.  Silver nitrate was used to achieve hemostasis.  Overall the patient tolerated the procedure well.  All instruments were removed from the vagina.  Laboratory & Radiologic Studies: CT chest 11/4: IMPRESSION: 1. Three months stability of bilateral subpleural predominant pulmonary nodules, strongly favored to represent subpleural lymph nodes. This could be re-evaluated with chest CT follow-up at 6 months. 2.  Aortic Atherosclerosis (ICD10-I70.0).  Assessment & Plan: Leah Diaz is a 60 y.o. 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  On hormonal maintenance therapy with Megace.  Patient is overall doing quite well.  Biopsy performed today of the vaginal cuff.  Exam findings are much more consistent with granulation tissue than recurrent cancer.  I will contact her with biopsy results.  Given findings on chest CT in August,  the patient underwent repeat chest CT earlier this month, which showed stable bilateral subpleural pulmonary nodules, favored to represent subpleural lymph nodes.  Recommendation was for repeat CT scan of the chest in 6 months.  Patient is NED on exam patient is tolerating Megace well without significant side effects.  We will continue this as maintenance therapy.  Given high risk histology with LMS and endometrial stromal sarcoma on final pathology, I recommended that we get a CT scan of her abdomen and pelvis.  As long as biopsy is negative and CT scan also negative for any evidence of disease, we will plan to repeat CT of the chest, abdomen, and pelvis in approximately 6 months.  Given high risk cancer, even though early stage, recommend visits every 3 to 4 months for the first 2 to 3 years per NCCN surveillance recommendations.  Would also recommend imaging every 6 months for the first several years.  Reviewed signs and symptoms that should prompt a phone call before her next scheduled visit.  36 minutes of total time was spent for this patient encounter, including preparation, face-to-face counseling with the patient and coordination of care, and documentation of the encounter.  Jeral Pinch, MD  Division of Gynecologic Oncology  Department of Obstetrics and Gynecology  [facility]

## 2021-10-03 LAB — SURGICAL PATHOLOGY

## 2021-10-08 ENCOUNTER — Ambulatory Visit (HOSPITAL_COMMUNITY): Payer: PRIVATE HEALTH INSURANCE

## 2021-10-13 ENCOUNTER — Ambulatory Visit: Payer: PRIVATE HEALTH INSURANCE | Admitting: Gynecologic Oncology

## 2021-10-13 ENCOUNTER — Other Ambulatory Visit: Payer: Self-pay

## 2021-10-13 ENCOUNTER — Ambulatory Visit (HOSPITAL_COMMUNITY)
Admission: RE | Admit: 2021-10-13 | Discharge: 2021-10-13 | Disposition: A | Discharge status: Home / Self Care | Payer: Managed Care, Other (non HMO) | Source: Ambulatory Visit | Attending: Gynecologic Oncology | Admitting: Gynecologic Oncology

## 2021-10-13 DIAGNOSIS — C549 Malignant neoplasm of corpus uteri, unspecified: Secondary | ICD-10-CM | POA: Insufficient documentation

## 2021-10-13 MED ORDER — IOHEXOL 350 MG/ML SOLN
100.0000 mL | Freq: Once | INTRAVENOUS | Status: AC | PRN
  Administered 2021-10-13: 17:00:00 100 mL via INTRAVENOUS

## 2021-10-23 ENCOUNTER — Other Ambulatory Visit: Payer: Self-pay | Admitting: Gynecologic Oncology

## 2021-10-23 DIAGNOSIS — C549 Malignant neoplasm of corpus uteri, unspecified: Secondary | ICD-10-CM

## 2021-12-15 ENCOUNTER — Encounter: Payer: Self-pay | Admitting: Oncology

## 2021-12-15 DIAGNOSIS — C549 Malignant neoplasm of corpus uteri, unspecified: Secondary | ICD-10-CM

## 2021-12-15 NOTE — Progress Notes (Signed)
Referral placed to survivorship program.

## 2021-12-29 ENCOUNTER — Encounter: Payer: Self-pay | Admitting: Gynecologic Oncology

## 2021-12-29 ENCOUNTER — Other Ambulatory Visit: Payer: Self-pay

## 2021-12-29 ENCOUNTER — Inpatient Hospital Stay: Payer: Managed Care, Other (non HMO) | Attending: Gynecologic Oncology | Admitting: Gynecologic Oncology

## 2021-12-29 VITALS — BP 139/87 | HR 99 | Temp 98.2°F | Resp 16 | Ht 67.72 in | Wt 215.8 lb

## 2021-12-29 DIAGNOSIS — C549 Malignant neoplasm of corpus uteri, unspecified: Secondary | ICD-10-CM

## 2021-12-29 DIAGNOSIS — C541 Malignant neoplasm of endometrium: Secondary | ICD-10-CM | POA: Insufficient documentation

## 2021-12-29 DIAGNOSIS — Z90710 Acquired absence of both cervix and uterus: Secondary | ICD-10-CM | POA: Insufficient documentation

## 2021-12-29 DIAGNOSIS — R632 Polyphagia: Secondary | ICD-10-CM

## 2021-12-29 DIAGNOSIS — Z90722 Acquired absence of ovaries, bilateral: Secondary | ICD-10-CM | POA: Diagnosis not present

## 2021-12-29 DIAGNOSIS — Z79818 Long term (current) use of other agents affecting estrogen receptors and estrogen levels: Secondary | ICD-10-CM | POA: Diagnosis not present

## 2021-12-29 NOTE — Progress Notes (Signed)
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  She return to see her established OB/GYN office for a problem related visit for this new bleeding symptom.  She saw them in the week after Thanksgiving and at that time pelvic examination revealed a cervical polyp which was removed and found to be benign.  She was also scheduled to have a transvaginal ultrasound scan which was performed on October 24, 2020.  This revealed a uterus measuring 8.5 x 6.5 x 6.7 cm.  The endometrium was not definitively visualized.  There is an area of hyper echogenicity seen measuring 18 mm that could represent thickened endometrial complex apart of a heterogeneous central uterine mass that was seen.  A large heterogeneous masslike area was seen in the upper uterine segment centrally measuring 5.3 x 5.7 x 4.8 cm.  He contained internal blood flow on color Doppler.  They could represent either a degenerating myoma or an endometrial neoplasm.  She was taken to the operating room for hysteroscopy and D&C on 11/06/2020.  At that time Dr. Addison Bailey reported that she did not see an endometrial lesion.  Curettings revealed scant fragments of benign endometrial mucosa and extensive hemorrhage but no invasive malignancy or dysplasia was identified.  The patient had normal post D&C spotting until January 2022 then no further bleeding until May 2022 when the bleeding recommenced again.  At this time she return to see Dr. Addison Bailey.  At that time Dr. Addison Bailey ordered a second ultrasound scan to follow-up on the mass that had been seen previously.  This time on ultrasound on 05/08/2021 the uterus measured 11 x 7.7 x 7.5 cm.  Again a heterogeneous hypervascular mass was seen within the uterine corpus and fundus.  It measured 6.1 x 6.1 x 6.1 cm  which had increased from its previous dimensions of 5.3 x 5.7 x 4.8 cm.  The endometrium was difficult to visualize.  The ovaries were not seen.   After seeing me for consultation, an MRI of the pelvis was performed to better characterize the mass prior to surgery.  This was performed on 06/02/2021.  It demonstrated a heterogeneous 9.0 cm intrauterine mass within the intramural/submucosal anterior fundus demonstrating interval growth, internal enhancement, and restricted diffusion 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 No extra uterine extension. No evidence of metastatic disease within the pelvis.   On 06/03/2021 she underwent robotic assisted total hysterectomy for uterus greater than 250 g, BSO, mini laparotomy for intact specimen delivery. Intraoperative findings were significant for a bulky 16 cm uterus with an intrauterine mass consistent with a fibroid like structure, smooth normal-appearing uterine serosa, no suspicious bulky nodes in the retroperitoneum, grossly normal ovaries bilaterally.  Normal upper abdomen to visual inspection.  Uterus required mini laparotomy for delivery due to size and concern for occult malignancy. Surgery was uncomplicated, with frozen section revealing a sarcoma.    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  The tumor was 6 cm.  It demonstrated extensive lymphovascular space invasion.  It was a high-grade lesion.  Her malignancy was assigned stage Ib based on dimension greater than 5 cm with no gross evidence of extrauterine disease.   After tumor board discussion as well as discussion with the patient, decision was made to  proceed with adjuvant hormonal therapy with Megace.  Interval History: Patient reports overall doing very well.  She denies any vaginal bleeding or discharge.  Reports regular bowel function.  Continues to have issues  with bladder function (urinary incontinence), unchanged.  Feels a little winded when she goes up stairs, which she thinks is related to her weight.  Is able to catch her breath very quickly.  Has noticed some increased appetite that she thinks is related to the Megace.  Since her last visit, had a mammogram that required follow-up ultrasound (she is scheduled for repeat mammogram in April.  Past Medical/Surgical History: Past Medical History:  Diagnosis Date   Anemia    Arthritis    Asthma    remote history as a teenager   GERD (gastroesophageal reflux disease)    Hypertension    Menopausal symptoms 05/05/2011   Migraines 05/05/2011   Seasonal allergies     Past Surgical History:  Procedure Laterality Date   ABDOMINAL HYSTERECTOMY     BREAST BIOPSY     x 2 benign   COLONOSCOPY N/A 09/18/2021   Procedure: COLONOSCOPY;  Surgeon: Rogene Houston, MD;  Location: [facility];  Service: Endoscopy;  Laterality: N/A;  8:05   HYSTEROSCOPY  11/06/2020   ROBOTIC ASSISTED TOTAL HYSTERECTOMY WITH BILATERAL SALPINGO OOPHERECTOMY N/A 06/03/2021   Procedure: XI ROBOTIC ASSISTED TOTAL HYSTERECTOMY FOR UTERUS GREATER THAN 250G WITH BILATERAL SALPINGO OOPHORECTOMY;  Surgeon: Everitt Amber, MD;  Location: [facility];  Service: Gynecology;  Laterality: N/A;   TUBAL LIGATION  05/05/2011    Family History  Problem Relation Age of Onset   Colon cancer Maternal Uncle    Pancreatic cancer Maternal Grandmother     Social History   Socioeconomic History   Marital status: Married    Spouse name: Not on file   Number of children: Not on file   Years of education: Not on file   Highest education level: Not on file  Occupational History   Not on file  Tobacco Use   Smoking status: Never   Smokeless tobacco: Never  Vaping Use   Vaping Use: Never used  Substance and Sexual Activity   Alcohol use: No    Comment: Occasional   Drug use: No   Sexual activity: Yes  Other Topics Concern   Not on file   Social History Narrative   Not on file   Social Determinants of Health   Financial Resource Strain: Not on file  Food Insecurity: Not on file  Transportation Needs: Not on file  Physical Activity: Not on file  Stress: Not on file  Social Connections: Not on file    Current Medications:  Current Outpatient Medications:    Ascorbic Acid (VITAMIN C PO), Take by mouth as needed., Disp: , Rfl:    aspirin 81 MG EC tablet, Take by mouth daily., Disp: , Rfl:    cetirizine (ZYRTEC) 10 MG tablet, Take 10 mg by mouth daily., Disp: , Rfl:    Cholecalciferol (VITAMIN D3 PO), Take 1 tablet by mouth daily., Disp: , Rfl:    fluticasone (FLONASE) 50 MCG/ACT nasal spray, Place 2 sprays into both nostrils 2 (two) times daily as needed for allergies or rhinitis., Disp: , Rfl:    ibuprofen (ADVIL) 200 MG tablet, Take 200 mg by mouth in the morning and at bedtime., Disp: , Rfl:    losartan-hydrochlorothiazide (HYZAAR) 100-25 MG tablet, Take 1 tablet by mouth daily., Disp: , Rfl:    megestrol (MEGACE) 40  MG tablet, TAKE 1 TABLET BY MOUTH TWICE A DAY, Disp: 180 tablet, Rfl: 1   Multiple Vitamins-Minerals (ZINC PO), Take 50 mg by mouth daily., Disp: , Rfl:    triamcinolone cream (KENALOG) 0.1 %, Apply 1 application topically 2 (two) times daily as needed (psoriasis)., Disp: , Rfl:    Turmeric (QC TUMERIC COMPLEX PO), Take 1,500 mg by mouth. Daily, Disp: , Rfl:    omeprazole (PRILOSEC) 20 MG capsule, Take 1 capsule (20 mg total) by mouth 2 (two) times daily., Disp: 28 capsule, Rfl: 0  Review of Systems: Denies fevers, chills, fatigue, unexplained weight changes. Denies hearing loss, neck lumps or masses, mouth sores, ringing in ears or voice changes. Denies cough or wheezing.  Denies shortness of breath. Denies chest pain or palpitations. Denies leg swelling. Denies abdominal distention, pain, blood in stools, constipation, diarrhea, nausea, vomiting, or early satiety. Denies pain with intercourse,  dysuria, frequency, hematuria or incontinence. Denies hot flashes, pelvic pain, vaginal bleeding or vaginal discharge.   Denies joint pain, back pain or muscle pain/cramps. Denies itching, rash, or wounds. Denies dizziness, headaches, numbness or seizures. Denies swollen lymph nodes or glands, denies easy bruising or bleeding. Denies anxiety, depression, confusion, or decreased concentration.  Physical Exam: BP 139/87 (BP Location: Left Arm, Patient Position: Sitting)    Pulse 99    Temp 98.2 F (36.8 C) (Oral)    Resp 16    Ht 5' 7.72" (1.72 m)    Wt 215 lb 12.8 oz (97.9 kg)    SpO2 100%    BMI 33.09 kg/m  General: Alert, oriented, no acute distress. HEENT: Normocephalic, atraumatic, sclera anicteric. Chest: Clear to auscultation bilaterally.  No wheezes or rhonchi. Cardiovascular: Regular rate and rhythm, no murmurs. Abdomen: Obese, soft, nontender.  Normoactive bowel sounds.  No masses or hepatosplenomegaly appreciated.  Well-healed incisions.   Extremities: Grossly normal range of motion.  Warm, well perfused.  No edema bilaterally. Skin: No rashes or lesions noted. Lymphatics: No cervical, supraclavicular, or inguinal adenopathy. GU: Normal appearing external genitalia without erythema, excoriation, or lesions.  Speculum exam reveals mildly atrophic vaginal mucosa, no bleeding or discharge.  There is an approximately 1 x 1 cm polypoid lesion along the midportion of the vaginal cuff, similar to exam last time although smaller after biopsy.  Bimanual exam reveals no nodularity or masses, polypoid tissue appreciated.  Rectovaginal exam confirms findings.  Laboratory & Radiologic Studies: None new  Assessment & Plan: Leah Diaz is a 61 y.o. 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  On hormonal maintenance therapy with Megace. Polypoid lesion noted at the vaginal cuff in 09/2021, biopsy  revealed granulation tissue.  Patient continues to do quite well, is NED on exam.  Continues to have polypoid lesion at the vaginal cuff, unchanged from last time and consistent with granulation tissue.  Patient continues to tolerate Megace, has some increased appetite.  Has gained 4 pounds since her last visit but this was over the holidays.  She is going to work towards weight loss and modifying her diet.  We will continue on maintenance therapy for now.   Given high risk histology with LMS and endometrial stromal sarcoma on final pathology, we will plan to repeat CT of the chest, abdomen, and pelvis 6 months after last imaging, which will be around the time of her visit in May.   Given high risk cancer, even though early stage, recommend  visits every 3 to 4 months for the first 2 to 3 years per NCCN surveillance recommendations.  Would also recommend imaging every 6 months for the first several years.  Reviewed signs and symptoms that should prompt a phone call before her next scheduled visit.  28 minutes of total time was spent for this patient encounter, including preparation, face-to-face counseling with the patient and coordination of care, and documentation of the encounter.  Jeral Pinch, MD  Division of Gynecologic Oncology  Department of Obstetrics and Gynecology  [facility]

## 2021-12-29 NOTE — Patient Instructions (Signed)
It was good to see you today.  I do not see or feel any evidence of cancer on your exam.  I will see you in 3 months for follow-up with repeat CT scan around that time.  Develop any new or concerning symptoms, please call and let me know.

## 2022-01-16 ENCOUNTER — Telehealth: Payer: Self-pay | Admitting: *Deleted

## 2022-03-13 ENCOUNTER — Other Ambulatory Visit: Payer: Self-pay | Admitting: Gynecologic Oncology

## 2022-03-13 DIAGNOSIS — C549 Malignant neoplasm of corpus uteri, unspecified: Secondary | ICD-10-CM

## 2022-03-16 ENCOUNTER — Other Ambulatory Visit: Payer: Self-pay

## 2022-03-16 ENCOUNTER — Inpatient Hospital Stay: Payer: Managed Care, Other (non HMO) | Attending: Gynecologic Oncology

## 2022-03-16 DIAGNOSIS — E6609 Other obesity due to excess calories: Secondary | ICD-10-CM | POA: Insufficient documentation

## 2022-03-16 DIAGNOSIS — C549 Malignant neoplasm of corpus uteri, unspecified: Secondary | ICD-10-CM | POA: Diagnosis not present

## 2022-03-16 DIAGNOSIS — Z6834 Body mass index (BMI) 34.0-34.9, adult: Secondary | ICD-10-CM | POA: Insufficient documentation

## 2022-03-16 DIAGNOSIS — Z90710 Acquired absence of both cervix and uterus: Secondary | ICD-10-CM | POA: Insufficient documentation

## 2022-03-16 DIAGNOSIS — C786 Secondary malignant neoplasm of retroperitoneum and peritoneum: Secondary | ICD-10-CM | POA: Insufficient documentation

## 2022-03-16 DIAGNOSIS — Z90722 Acquired absence of ovaries, bilateral: Secondary | ICD-10-CM | POA: Insufficient documentation

## 2022-03-16 DIAGNOSIS — Z79818 Long term (current) use of other agents affecting estrogen receptors and estrogen levels: Secondary | ICD-10-CM | POA: Insufficient documentation

## 2022-03-16 DIAGNOSIS — R1900 Intra-abdominal and pelvic swelling, mass and lump, unspecified site: Secondary | ICD-10-CM | POA: Insufficient documentation

## 2022-03-16 DIAGNOSIS — Z7982 Long term (current) use of aspirin: Secondary | ICD-10-CM | POA: Insufficient documentation

## 2022-03-16 DIAGNOSIS — Z79899 Other long term (current) drug therapy: Secondary | ICD-10-CM | POA: Insufficient documentation

## 2022-03-16 LAB — COMPREHENSIVE METABOLIC PANEL
ALT: 20 U/L (ref 0–44)
AST: 15 U/L (ref 15–41)
Albumin: 4.3 g/dL (ref 3.5–5.0)
Alkaline Phosphatase: 65 U/L (ref 38–126)
Anion gap: 6 (ref 5–15)
BUN: 14 mg/dL (ref 8–23)
CO2: 28 mmol/L (ref 22–32)
Calcium: 9.4 mg/dL (ref 8.9–10.3)
Chloride: 101 mmol/L (ref 98–111)
Creatinine, Ser: 0.79 mg/dL (ref 0.44–1.00)
GFR, Estimated: >60 mL/min (ref >60)
Glucose, Bld: 119 mg/dL — ABNORMAL HIGH (ref 70–99)
Potassium: 4 mmol/L (ref 3.5–5.1)
Sodium: 135 mmol/L (ref 135–145)
Total Bilirubin: 0.4 mg/dL (ref 0.3–1.2)
Total Protein: 7.1 g/dL (ref 6.5–8.1)

## 2022-03-17 ENCOUNTER — Encounter: Payer: Self-pay | Admitting: Gynecologic Oncology

## 2022-03-17 ENCOUNTER — Ambulatory Visit (HOSPITAL_COMMUNITY)
Admission: RE | Admit: 2022-03-17 | Discharge: 2022-03-17 | Disposition: A | Discharge status: Home / Self Care | Payer: Managed Care, Other (non HMO) | Source: Ambulatory Visit | Attending: Gynecologic Oncology | Admitting: Gynecologic Oncology

## 2022-03-17 DIAGNOSIS — C549 Malignant neoplasm of corpus uteri, unspecified: Secondary | ICD-10-CM

## 2022-03-17 MED ORDER — IOHEXOL 300 MG/ML  SOLN
100.0000 mL | Freq: Once | INTRAMUSCULAR | Status: AC | PRN
  Administered 2022-03-17: 100 mL via INTRAVENOUS

## 2022-03-17 MED ORDER — SODIUM CHLORIDE (PF) 0.9 % IJ SOLN
INTRAMUSCULAR | Status: AC
  Filled 2022-03-17: qty 50

## 2022-03-19 ENCOUNTER — Encounter: Payer: Self-pay | Admitting: Oncology

## 2022-03-19 ENCOUNTER — Inpatient Hospital Stay: Payer: Managed Care, Other (non HMO) | Admitting: *Deleted

## 2022-03-19 ENCOUNTER — Other Ambulatory Visit: Payer: Self-pay

## 2022-03-19 ENCOUNTER — Encounter: Payer: Self-pay | Admitting: Gynecologic Oncology

## 2022-03-19 ENCOUNTER — Inpatient Hospital Stay (HOSPITAL_BASED_OUTPATIENT_CLINIC_OR_DEPARTMENT_OTHER): Payer: Managed Care, Other (non HMO) | Admitting: Gynecologic Oncology

## 2022-03-19 VITALS — BP 145/71 | HR 95 | Temp 98.1°F | Resp 18 | Ht 67.0 in | Wt 228.0 lb

## 2022-03-19 DIAGNOSIS — C549 Malignant neoplasm of corpus uteri, unspecified: Secondary | ICD-10-CM

## 2022-03-19 DIAGNOSIS — Z7189 Other specified counseling: Secondary | ICD-10-CM | POA: Diagnosis not present

## 2022-03-19 DIAGNOSIS — R1900 Intra-abdominal and pelvic swelling, mass and lump, unspecified site: Secondary | ICD-10-CM

## 2022-03-19 DIAGNOSIS — C801 Malignant (primary) neoplasm, unspecified: Secondary | ICD-10-CM

## 2022-03-19 NOTE — Progress Notes (Signed)
Met with Leah Diaz and discussed new patient appointment with Dr. [name] and PET scan on 03/27/22.  She is not able to schedule appointments on that day due to her son graduating from [employer].  Rescheduled appointments to 03/31/22 to see Dr. [name] at 2:00 with 1:30 arrival and PET scan on 04/02/22 at 9:30.  Discussed getting a port a cath for chemo and she would like to go ahead and schedule it.  Gave her the [employer] and encouraged her to call with any questions or needs. ?

## 2022-03-19 NOTE — Progress Notes (Addendum)
Gynecologic Oncology Return Clinic Visit ? ?03/19/2022 ? 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 ?Treatment History: ?Oncology History  ?Uterine sarcoma ([place])  ?06/03/2021 Initial Diagnosis  ? Uterine sarcoma ([place]) ? ?  ?06/03/2021 Cancer Staging  ? Staging form: Corpus Uteri - Sarcoma, AJCC 7th Edition ?- Clinical stage from 06/03/2021: FIGO Stage IB (T1b, N0, M0) - Signed by [name], NP on 03/12/2022 ?Diagnostic confirmation: Positive histology ?Biopsy of metastatic site performed: No ?Lymph-vascular invasion (LVI): LVI present/identified, NOS ? ?  ? ?The patient began having postmenopausal bleeding in the week before Thanksgiving in November 2021.  She return to see her established OB/GYN office for a problem related visit for this new bleeding symptom.  She saw them in the week after Thanksgiving and at that time pelvic examination revealed a cervical polyp which was removed and found to be benign.  She was also scheduled to have a transvaginal ultrasound scan which was performed on October 24, 2020.  This revealed a uterus measuring 8.5 x 6.5 x 6.7 cm.  The endometrium was not definitively visualized.  There is an area of hyper echogenicity seen measuring 18 mm that could represent thickened endometrial complex apart of a heterogeneous central uterine mass that was seen.  A large heterogeneous masslike area was seen in the upper uterine segment centrally measuring 5.3 x 5.7 x 4.8 cm.  He contained internal blood flow on color Doppler.  They could represent either a degenerating myoma or an endometrial neoplasm. ? ?She was taken to the operating room for hysteroscopy and D&C on 11/06/2020.  At that time Dr. Addison Bailey reported that she did not see an endometrial lesion.  Curettings revealed scant fragments of benign endometrial mucosa and extensive hemorrhage but no invasive malignancy or dysplasia was identified. ? ?The patient had normal post D&C spotting until  January 2022 then no further bleeding until May 2022 when the bleeding recommenced again.  At this time she return to see Dr. Addison Bailey.  At that time Dr. Addison Bailey ordered a second ultrasound scan to follow-up on the mass that had been seen previously.  This time on ultrasound on 05/08/2021 the uterus measured 11 x 7.7 x 7.5 cm.  Again a heterogeneous hypervascular mass was seen within the uterine corpus and fundus.  It measured 6.1 x 6.1 x 6.1 cm which had increased from its previous dimensions of 5.3 x 5.7 x 4.8 cm.  The endometrium was difficult to visualize.  The ovaries were not seen. ?  ?After seeing me for consultation, an MRI of the pelvis was performed to better characterize the mass prior to surgery.  This was performed on 06/02/2021.  It demonstrated a heterogeneous 9.0 cm intrauterine mass within the ?intramural/submucosal anterior fundus demonstrating interval growth, internal enhancement, and restricted diffusion 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 No extra uterine extension. No evidence of metastatic disease within the pelvis. ?  ?On 06/03/2021 she underwent robotic assisted total hysterectomy for uterus greater than 250 g, BSO, mini laparotomy for intact specimen delivery. Intraoperative findings were significant for a bulky 16 cm uterus with an intrauterine mass consistent with a fibroid like structure, smooth normal-appearing uterine serosa, no suspicious bulky nodes in the retroperitoneum, grossly normal ovaries bilaterally.  Normal upper abdomen to visual inspection.  Uterus required mini laparotomy for delivery due to size and concern for occult malignancy. Surgery was uncomplicated, with frozen section revealing a sarcoma.  ?  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  ER/PR positive.  The tumor was 6 cm.  It demonstrated extensive lymphovascular space invasion.  It was a high-grade  lesion. ? ?Her malignancy was assigned stage Ib based on dimension greater than 5 cm with no gross evidence of extrauterine disease. ?  ?After tumor board discussion as well as discussion with the patient, decision was made to proceed with adjuvant hormonal therapy with Megace. ? ?Interval History: ?Patient reports doing well.  She endorses good energy.  Denies any vaginal bleeding or discharge.  Reports regular bowel function.  Continues to have some urinary incontinence, unchanged from baseline. ? ?Past Medical/Surgical History: ?Past Medical History:  ?Diagnosis Date  ? Anemia   ? Arthritis   ? Asthma   ? remote history as a teenager  ? GERD (gastroesophageal reflux disease)   ? Hypertension   ? Menopausal symptoms 05/05/2011  ? Migraines 05/05/2011  ? Seasonal allergies   ? ? ?Past Surgical History:  ?Procedure Laterality Date  ? ABDOMINAL HYSTERECTOMY    ? BREAST BIOPSY    ? x 2 benign  ? COLONOSCOPY N/A 09/18/2021  ? Procedure: COLONOSCOPY;  Surgeon: Rogene Houston, MD;  Location: [facility];  Service: Endoscopy;  Laterality: N/A;  8:05  ? HYSTEROSCOPY  11/06/2020  ? ROBOTIC ASSISTED TOTAL HYSTERECTOMY WITH BILATERAL SALPINGO OOPHERECTOMY N/A 06/03/2021  ? Procedure: XI ROBOTIC ASSISTED TOTAL HYSTERECTOMY FOR UTERUS GREATER THAN 250G WITH BILATERAL SALPINGO OOPHORECTOMY;  Surgeon: Everitt Amber, MD;  Location: [facility];  Service: Gynecology;  Laterality: N/A;  ? TUBAL LIGATION  05/05/2011  ? ? ?Family History  ?Problem Relation Age of Onset  ? Colon cancer Maternal Uncle   ? Pancreatic cancer Maternal Grandmother   ? ? ?Social History  ? ?Socioeconomic History  ? Marital status: Married  ?  Spouse name: Not on file  ? Number of children: Not on file  ? Years of education: Not on file  ? Highest education level: Not on file  ?Occupational History  ? Not on file  ?Tobacco Use  ? Smoking status: Never  ? Smokeless tobacco: Never  ?Vaping Use  ? Vaping Use: Never used  ?Substance and Sexual Activity  ? Alcohol  use: No  ?  Comment: Occasional  ? Drug use: No  ? Sexual activity: Yes  ?Other Topics Concern  ? Not on file  ?Social History Narrative  ? Not on file  ? ?Social Determinants of Health  ? ?Financial Resource Strain: Not on file  ?Food Insecurity: Not on file  ?Transportation Needs: Not on file  ?Physical Activity: Not on file  ?Stress: Not on file  ?Social Connections: Not on file  ? ? ?Current Medications: ? ?Current Outpatient Medications:  ?  Ascorbic Acid (VITAMIN C PO), Take by mouth as needed., Disp: , Rfl:  ?  aspirin 81 MG EC tablet, Take by mouth daily., Disp: , Rfl:  ?  cetirizine (ZYRTEC) 10 MG tablet, Take 10 mg by mouth daily., Disp: , Rfl:  ?  Cholecalciferol (VITAMIN D3 PO), Take 1 tablet by mouth daily., Disp: , Rfl:  ?  fluticasone (FLONASE) 50 MCG/ACT nasal spray, Place 2 sprays into both nostrils 2 (two) times daily as needed for allergies or rhinitis., Disp: , Rfl:  ?  ibuprofen (ADVIL) 200 MG tablet, Take 200 mg by mouth in the morning and at bedtime., Disp: , Rfl:  ?  losartan-hydrochlorothiazide (HYZAAR) 100-25 MG tablet, Take 1 tablet  by mouth daily., Disp: , Rfl:  ?  megestrol (MEGACE) 40 MG tablet, TAKE 1 TABLET BY MOUTH TWICE A DAY, Disp: 180 tablet, Rfl: 1 ?  Multiple Vitamins-Minerals (ZINC PO), Take 50 mg by mouth daily., Disp: , Rfl:  ?  triamcinolone cream (KENALOG) 0.1 %, Apply 1 application topically 2 (two) times daily as needed (psoriasis)., Disp: , Rfl:  ?  Turmeric (QC TUMERIC COMPLEX PO), Take 1,500 mg by mouth. Daily, Disp: , Rfl:  ?  omeprazole (PRILOSEC) 20 MG capsule, Take 1 capsule (20 mg total) by mouth 2 (two) times daily., Disp: 28 capsule, Rfl: 0 ? ?Review of Systems: ?Denies appetite changes, fevers, chills, fatigue, unexplained weight changes. ?Denies hearing loss, neck lumps or masses, mouth sores, ringing in ears or voice changes. ?Denies cough or wheezing.  Denies shortness of breath. ?Denies chest pain or palpitations. Denies leg swelling. ?Denies abdominal  distention, pain, blood in stools, constipation, diarrhea, nausea, vomiting, or early satiety. ?Denies pain with intercourse, dysuria, frequency, hematuria or incontinence. ?Denies hot flashes, pelvic pain, vagina

## 2022-03-19 NOTE — Patient Instructions (Addendum)
It was good to see you today.  Based on findings on CT scan, I have already spoken with Dr. [name], her medical oncologist.  We will get you scheduled for a visit to see her to talk about treatment options. ? ?You can continue taking your Megace for now until we make a decision about moving forward with treatment. ?

## 2022-03-23 ENCOUNTER — Other Ambulatory Visit: Payer: Managed Care, Other (non HMO)

## 2022-03-24 ENCOUNTER — Ambulatory Visit (HOSPITAL_COMMUNITY): Payer: Managed Care, Other (non HMO)

## 2022-03-26 ENCOUNTER — Ambulatory Visit: Payer: Managed Care, Other (non HMO) | Admitting: Gynecologic Oncology

## 2022-03-26 ENCOUNTER — Other Ambulatory Visit: Payer: Self-pay | Admitting: Oncology

## 2022-03-26 DIAGNOSIS — C549 Malignant neoplasm of corpus uteri, unspecified: Secondary | ICD-10-CM

## 2022-03-26 NOTE — Progress Notes (Signed)
Order for port placement

## 2022-03-27 ENCOUNTER — Other Ambulatory Visit (HOSPITAL_COMMUNITY): Payer: Managed Care, Other (non HMO)

## 2022-03-30 ENCOUNTER — Telehealth: Payer: Self-pay | Admitting: Oncology

## 2022-03-30 NOTE — Telephone Encounter (Signed)
Called Leah Diaz and advised her that we will need to reschedule her education appointment for tomorrow.  Also reviewed her appointment time to see Dr. [name] tomorrow.  She verbalized understanding and agreement. ?

## 2022-03-31 ENCOUNTER — Inpatient Hospital Stay (HOSPITAL_BASED_OUTPATIENT_CLINIC_OR_DEPARTMENT_OTHER): Payer: Managed Care, Other (non HMO) | Admitting: Hematology and Oncology

## 2022-03-31 ENCOUNTER — Encounter: Payer: Self-pay | Admitting: Oncology

## 2022-03-31 ENCOUNTER — Encounter: Payer: Self-pay | Admitting: Hematology and Oncology

## 2022-03-31 ENCOUNTER — Inpatient Hospital Stay: Payer: Managed Care, Other (non HMO)

## 2022-03-31 VITALS — BP 142/63 | HR 88 | Temp 99.5°F | Resp 18 | Ht 67.0 in | Wt 223.0 lb

## 2022-03-31 DIAGNOSIS — E6609 Other obesity due to excess calories: Secondary | ICD-10-CM | POA: Diagnosis not present

## 2022-03-31 DIAGNOSIS — Z6841 Body Mass Index (BMI) 40.0 and over, adult: Secondary | ICD-10-CM | POA: Diagnosis not present

## 2022-03-31 DIAGNOSIS — C549 Malignant neoplasm of corpus uteri, unspecified: Secondary | ICD-10-CM

## 2022-03-31 DIAGNOSIS — C786 Secondary malignant neoplasm of retroperitoneum and peritoneum: Secondary | ICD-10-CM

## 2022-03-31 MED ORDER — PROCHLORPERAZINE MALEATE 10 MG PO TABS: 10.0000 mg | ORAL_TABLET | Freq: Four times a day (QID) | ORAL | 1 refills | Status: DC | PRN

## 2022-03-31 MED ORDER — ONDANSETRON HCL 8 MG PO TABS: 8.0000 mg | ORAL_TABLET | Freq: Three times a day (TID) | ORAL | 1 refills | Status: DC | PRN

## 2022-03-31 MED ORDER — LIDOCAINE-PRILOCAINE 2.5-2.5 % EX CREA: TOPICAL_CREAM | CUTANEOUS | 3 refills | Status: DC

## 2022-03-31 NOTE — Assessment & Plan Note (Signed)
We have extensive discussions about the importance of weight loss in association between obesity and risk of disease progression ?

## 2022-03-31 NOTE — Progress Notes (Signed)
[place] ?CONSULT NOTE ? ?Patient Care Team: ?Curlene Labrum, MD as PCP - General (Family Medicine) ?[name], MD as Consulting Physician (Gynecologic Oncology) ?[name], NP as Nurse Practitioner (Gynecologic Oncology) ?[name], RN as Registered Nurse ?Gardenia Phlegm, NP as Nurse Practitioner (Hematology and Oncology) ? 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 ?We discussed current NCCN guidelines and treatment options ?We discussed risk, benefits, side effects of single agent doxorubicin versus combination of gemcitabine with Taxotere ?After long discussion, she is in agreement to proceed with single agent doxorubicin ?We discussed pretreatment echocardiogram and port placement ?Due to high calculated dose of doxorubicin, I will start her at 60 mg per metered square ?I plan minimum 3 cycles of treatment before repeat CT imaging for assessment ?She would need chemo education class before we proceed ?I will see her on June 1 before starting cycle 1 of treatment ? ?Class 1 obesity due to excess calories with body mass index (BMI) of 34.0 to 34.9 in adult ?We have extensive discussions about the importance of weight loss in association between obesity and risk of disease progression ? ?Orders Placed This Encounter  ?Procedures  ? CBC with Differential ([place] Only)  ?  Standing Status:   Standing  ?  Number of Occurrences:   20  ?  Standing Expiration Date:   04/01/2023  ? CMP ([place] only)  ?  Standing Status:   Standing  ?  Number of Occurrences:   20  ?  Standing Expiration Date:   04/01/2023  ? ECHOCARDIOGRAM COMPLETE  ?  Standing Status:   Future  ?  Standing  Expiration Date:   04/01/2023  ?  Order Specific Question:   Where should this test be performed  ?  Answer:   [facility]  ?  Order Specific Question:   Perflutren DEFINITY (image enhancing agent) should be administered unless hypersensitivity or allergy exist  ?  Answer:   Administer Perflutren  ?  Order Specific Question:   Reason for exam-Echo  ?  Answer:   Chemo  Z09  ? ? ?The total time spent in the appointment was 80 minutes encounter with patients including review of chart and various tests results, discussions about plan of care and coordination of care plan ? ? All questions were answered. The patient knows to call the clinic with any problems, questions or concerns. No barriers to learning was detected. ? ?[name], MD ?5/16/20233:17 PM ? 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 ?HISTORY OF PRESENTING ILLNESS:  ?Leah Diaz 61 y.o. female is here because of recurrent uterine sarcoma ?She is here accompanied by her husband, Gwyndolyn Saxon ?The patient is an accountant ?She was first diagnosed with cancer after presentation with postmenopausal bleeding ?She had complete hysterectomy last year ?She was started on Megace as a form of adjuvant treatment due to ER/PR positivity of her disease ?Most recently, CT imaging came back abnormal and she is being referred here for systemic chemotherapy due to disease recurrence with metastatic peritoneal nodules ?She is not symptomatic ? ?I have reviewed her chart and materials related to her cancer extensively and collaborated history with the patient.  Summary of oncologic history is as follows: ?Oncology History Overview Note  ?High grade and LMS, 50% ER/PR positive ?  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 MRI pelvis ?Heterogeneous 9.0 cm intrauterine mass within the intramural/submucosal anterior fundus demonstrating interval growth, internal enhancement, and restricted diffusion suspicious for an  intrauterine leiomyosarcoma in this postmenopausal patient. No extra uterine extension. No evidence of metastatic disease within the pelvis. ?  ?Mild thickening of the endometrial stripe, likely related to entrapment by the adjacent uterine mass ?  ?06/03/2021 Initial Diagnosis  ? 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 ?  ?06/03/2021 Cancer Staging  ? Staging form: Corpus Uteri - Sarcoma, AJCC 7th Edition ?- Clinical stage from 06/03/2021: FIGO Stage IVB (rT1b, N0, M1) - Signed by [name], MD on 03/31/2022 ?Diagnostic confirmation: Positive histology ?Stage prefix: Recurrence ?Biopsy of metastatic site performed: No ?Lymph-vascular invasion (LVI): LVI present/identified, NOS ? ?  ?06/03/2021 Pathology Results  ? FINAL MICROSCOPIC DIAGNOSIS:  ? ?A. UTERUS, CERVIX, BILATERAL FALLOPIAN TUBES AND OVARIES, HYSTERECTOMY:  ?- Uterus:  ?     Mixed high grade uterine sarcoma, spanning 6 cm, see comment.  ?     Extensive lymphovascular invasion.  ?     See oncology table.  ?- Cervix: Benign squamous and endocervical mucosa. No dysplasia or  ?malignancy.  ?- Bilateral ovaries: Unremarkable. No malignancy.  ?- Bilateral fallopian tubes: Unremarkable. No malignancy.  ? ?ONCOLOGY TABLE:  ? ?UTERUS, SARCOMA: Resection  ? ?Procedure: Total hysterectomy and bilateral salpingo-oophorectomy  ?Specimen Integrity: Focally disrupted on posterior surface  ?Tumor Site: Anterior wall  ?Tumor Size: 6 cm  ?Histologic Type: High grade sarcoma, mixed. See comment.  ?Other Tissue/ Organ Involvement: Not identified  ?Lymphovascular Invasion: Present, extensive.  ?Margins: All margins negative for tumor  ?Regional Lymph Nodes: Not applicable (no lymph nodes submitted or found)  ?Distant Metastasis:  ?     Distant Site(s) Involved: Not applicable  ?Pathologic Stage Classification (pTNM, AJCC 8th Edition): pT1b, pN not  ?assigned  ?Ancillary Studies: Can be performed upon request  ?Representative Tumor Block: A6  ?Comment(s): The tumor has two morphologically  different components. One component consists of malignant spindle cells which can be seen arising from the smooth muscle. These foci are positive for SMA, desmin, cyclinD1 (focal), and CD10 (patchy). The other component consists of round to slightly spindle cells with abundant admixed vessels and occasional large pleomorphic cells. These areas are positive for SMA (patchy weak), desmin (focal), CD10 (variable with diffuse areas). Pancytokeratin is negative. 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  ? ?ADDENDUM:  ? ?PROGNOSTIC INDICATOR RESULTS:  ? ?Immunohistochemical and morphometric analysis performed manually  ? ? ?Estrogen Receptor:       POSITIVE, 50%, WEAK TO MODERATE STAINING  ?Progesterone Receptor:   POSITIVE, 50%, MODERATE TO STRONG STAINING  ? ?Reference Range Estrogen and Progesterone Receptor  ?     Negative  0%  ?     Positive  >1%  ?  ?06/03/2021 Surgery  ? Surgeon: Donaciano Eva  ?  ?Assistants: Dr Lahoma Crocker (an MD assistant was necessary for tissue manipulation, management of robotic instrumentation, retraction and positioning due to the complexity of the case and hospital policies).  ?Operation: Robotic-assisted laparoscopic total hysterectomy >250gm with bilateral salpingoophorectomy, minilaparotomy for specimen delivery ?  ?Surgeon: Donaciano Eva ?   ?Operative Findings:  : Bulky 16cm uterus with intra-uterine mass (consistent with a fibroid-like mass) , smooth normal  appearing serosa, no suspicious bulky nodes. Normal ovaries bilaterally. Normal upper abdomen. Uterus too large to deliver vaginally in tact.  ?  ?06/18/2021 Imaging  ? 1. Multiple bilateral pulmonary nodules, measuring up to 9 mm. Most of these are perifissural and subpleural in location, likely lymph nodes. Nevertheless, close follow-up recommended to exclude metastatic disease. ?2. 11 mm hypoattenuating lesion towards the dome of the  liver, indeterminate. MRI abdomen with and without contrast could be used to further evaluate as clinically warranted. ?3. Aortic Atherosclerosis (ICD10-I70.0). ?  ?10/13/2021 Imaging  ? 1. No acute findings within the abdomen

## 2022-03-31 NOTE — Progress Notes (Signed)
Requested PD-L1, MMR and MSI testing on accession [id number] with [facility] via email. ?

## 2022-03-31 NOTE — Assessment & Plan Note (Signed)
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 ?We discussed current NCCN guidelines and treatment options ?We discussed risk, benefits, side effects of single agent doxorubicin versus combination of gemcitabine with Taxotere ?After long discussion, she is in agreement to proceed with single agent doxorubicin ?We discussed pretreatment echocardiogram and port placement ?Due to high calculated dose of doxorubicin, I will start her at 60 mg per metered square ?I plan minimum 3 cycles of treatment before repeat CT imaging for assessment ?She would need chemo education class before we proceed ?I will see her on June 1 before starting cycle 1 of treatment ?

## 2022-03-31 NOTE — Progress Notes (Signed)
START OFF PATHWAY REGIMEN - Uterine ? ? ?[id number]:Doxorubicin 75 mg/m2 IV D1 q21 Days: ?  A cycle is every 21 days: ?    Doxorubicin  ? ?**Always confirm dose/schedule in your pharmacy ordering system** ? ?Patient Characteristics: ?High Grade Undifferentiated/Leiomyosarcoma, Recurrent/Progressive Disease, Resected, Second Line, Regional/Systemic Recurrence, Relapse ? 12 Months From Prior Therapy ?Histology: High Grade Undifferentiated/Leiomyosarcoma ?Therapeutic Status: Recurrent or Progressive Disease ?Surgical Status: Resected ?Line of Therapy: Second Line ?Type of Recurrence: Regional/Systemic Recurrence ?Time to Recurrence: Relapse ? 12 Months From Prior Therapy ?Intent of Therapy: ?Curative Intent, Discussed with Patient ?

## 2022-04-01 ENCOUNTER — Telehealth: Payer: Self-pay | Admitting: Oncology

## 2022-04-01 NOTE — Telephone Encounter (Signed)
Called Ceilidh and reviewed echo appointment on 04/09/22 at [facility].  Also discussed that Dr. [name] is not recommending taking anything for anxiety before the PET scan tomorrow because it is open.  She verbalized understanding and agreement. ?

## 2022-04-02 ENCOUNTER — Ambulatory Visit (HOSPITAL_COMMUNITY)
Admission: RE | Admit: 2022-04-02 | Discharge: 2022-04-02 | Disposition: A | Discharge status: Home / Self Care | Payer: Managed Care, Other (non HMO) | Source: Ambulatory Visit | Attending: Gynecologic Oncology | Admitting: Gynecologic Oncology

## 2022-04-02 ENCOUNTER — Inpatient Hospital Stay: Payer: Managed Care, Other (non HMO)

## 2022-04-02 ENCOUNTER — Other Ambulatory Visit: Payer: Self-pay

## 2022-04-02 DIAGNOSIS — C549 Malignant neoplasm of corpus uteri, unspecified: Secondary | ICD-10-CM | POA: Diagnosis present

## 2022-04-02 LAB — GLUCOSE, CAPILLARY: Glucose-Capillary: 128 mg/dL — ABNORMAL HIGH (ref 70–99)

## 2022-04-02 MED ORDER — FLUDEOXYGLUCOSE F - 18 (FDG) INJECTION
11.1700 | Freq: Once | INTRAVENOUS | Status: AC
  Administered 2022-04-02: 11.17 via INTRAVENOUS

## 2022-04-05 LAB — SURGICAL PATHOLOGY

## 2022-04-06 ENCOUNTER — Telehealth: Payer: Self-pay | Admitting: Oncology

## 2022-04-06 ENCOUNTER — Telehealth: Payer: Self-pay | Admitting: Gynecologic Oncology

## 2022-04-06 NOTE — Telephone Encounter (Signed)
Called patient.  Discussed PET scan results.  She is scheduled for a port later this week and will be starting chemotherapy soon.  Jeral Pinch MD Gynecologic Oncology

## 2022-04-06 NOTE — Telephone Encounter (Signed)
Leah Diaz called and reviewed port placement appointment and instructions for Wednesday.

## 2022-04-07 ENCOUNTER — Other Ambulatory Visit: Payer: Self-pay | Admitting: Internal Medicine

## 2022-04-08 ENCOUNTER — Ambulatory Visit (HOSPITAL_COMMUNITY)
Admission: RE | Admit: 2022-04-08 | Discharge: 2022-04-08 | Disposition: A | Discharge status: Home / Self Care | Payer: Managed Care, Other (non HMO) | Source: Ambulatory Visit | Attending: Hematology and Oncology | Admitting: Hematology and Oncology

## 2022-04-08 ENCOUNTER — Other Ambulatory Visit: Payer: Self-pay

## 2022-04-08 ENCOUNTER — Encounter (HOSPITAL_COMMUNITY): Payer: Self-pay

## 2022-04-08 DIAGNOSIS — J45909 Unspecified asthma, uncomplicated: Secondary | ICD-10-CM | POA: Insufficient documentation

## 2022-04-08 DIAGNOSIS — I10 Essential (primary) hypertension: Secondary | ICD-10-CM | POA: Insufficient documentation

## 2022-04-08 DIAGNOSIS — C549 Malignant neoplasm of corpus uteri, unspecified: Secondary | ICD-10-CM

## 2022-04-08 DIAGNOSIS — K219 Gastro-esophageal reflux disease without esophagitis: Secondary | ICD-10-CM | POA: Insufficient documentation

## 2022-04-08 DIAGNOSIS — C55 Malignant neoplasm of uterus, part unspecified: Secondary | ICD-10-CM | POA: Insufficient documentation

## 2022-04-08 DIAGNOSIS — D649 Anemia, unspecified: Secondary | ICD-10-CM | POA: Insufficient documentation

## 2022-04-08 HISTORY — PX: IR IMAGING GUIDED PORT INSERTION: IMG5740

## 2022-04-08 MED ORDER — MIDAZOLAM HCL 2 MG/2ML IJ SOLN
INTRAMUSCULAR | Status: AC
  Filled 2022-04-08: qty 4

## 2022-04-08 MED ORDER — LIDOCAINE-EPINEPHRINE 1 %-1:100000 IJ SOLN
INTRAMUSCULAR | Status: AC | PRN
  Administered 2022-04-08: 10 mL via INTRADERMAL

## 2022-04-08 MED ORDER — LIDOCAINE HCL 1 % IJ SOLN
INTRAMUSCULAR | Status: AC
  Filled 2022-04-08: qty 20

## 2022-04-08 MED ORDER — HEPARIN SOD (PORK) LOCK FLUSH 100 UNIT/ML IV SOLN
INTRAVENOUS | Status: AC | PRN
  Administered 2022-04-08: 500 [IU] via INTRAVENOUS

## 2022-04-08 MED ORDER — FENTANYL CITRATE (PF) 100 MCG/2ML IJ SOLN
INTRAMUSCULAR | Status: AC
  Filled 2022-04-08: qty 2

## 2022-04-08 MED ORDER — MIDAZOLAM HCL 2 MG/2ML IJ SOLN
INTRAMUSCULAR | Status: AC | PRN
  Administered 2022-04-08: 1 mg via INTRAVENOUS

## 2022-04-08 MED ORDER — MIDAZOLAM HCL 2 MG/2ML IJ SOLN
INTRAMUSCULAR | Status: AC | PRN
Start: 2022-04-08 — End: 2022-04-08
  Administered 2022-04-08: 1 mg via INTRAVENOUS

## 2022-04-08 MED ORDER — FENTANYL CITRATE (PF) 100 MCG/2ML IJ SOLN
INTRAMUSCULAR | Status: AC | PRN
  Administered 2022-04-08: 50 ug via INTRAVENOUS

## 2022-04-08 MED ORDER — MIDAZOLAM HCL 2 MG/2ML IJ SOLN
INTRAMUSCULAR | Status: AC | PRN
  Administered 2022-04-08: .5 mg via INTRAVENOUS

## 2022-04-08 MED ORDER — LIDOCAINE-EPINEPHRINE 1 %-1:100000 IJ SOLN
INTRAMUSCULAR | Status: AC
  Filled 2022-04-08: qty 1

## 2022-04-08 MED ORDER — LIDOCAINE HCL (PF) 1 % IJ SOLN
INTRAMUSCULAR | Status: AC | PRN
  Administered 2022-04-08: 5 mL via INTRADERMAL

## 2022-04-08 MED ORDER — SODIUM CHLORIDE 0.9 % IV SOLN: INTRAVENOUS | Status: DC

## 2022-04-08 MED ORDER — HEPARIN SOD (PORK) LOCK FLUSH 100 UNIT/ML IV SOLN
INTRAVENOUS | Status: AC
  Filled 2022-04-08: qty 5

## 2022-04-08 NOTE — Discharge Instructions (Signed)

## 2022-04-08 NOTE — Consult Note (Addendum)
Chief Complaint: Patient was seen in consultation today for  port a cath placement  Referring Physician(s): Zap  Supervising Physician: Delma Post  Patient Status: [place]  History of Present Illness: Leah Diaz is a 61 y.o. female with past medical history significant for anemia, arthritis, mild asthma, GERD, hypertension, migraine headaches and uterine sarcoma diagnosed in 2022 with prior hysterectomy on 06/03/2021.  She now has evidence of recurrence and presents today for Port-A-Cath placement to assist with treatment.  Past Medical History:  Diagnosis Date   Anemia    Arthritis    Asthma    remote history as a teenager   GERD (gastroesophageal reflux disease)    Hypertension    Menopausal symptoms 05/05/2011   Migraines 05/05/2011   Seasonal allergies     Past Surgical History:  Procedure Laterality Date   ABDOMINAL HYSTERECTOMY     BREAST BIOPSY     x 2 benign   COLONOSCOPY N/A 09/18/2021   Procedure: COLONOSCOPY;  Surgeon: Rogene Houston, MD;  Location: [facility];  Service: Endoscopy;  Laterality: N/A;  8:05   HYSTEROSCOPY  11/06/2020   ROBOTIC ASSISTED TOTAL HYSTERECTOMY WITH BILATERAL SALPINGO OOPHERECTOMY N/A 06/03/2021   Procedure: XI ROBOTIC ASSISTED TOTAL HYSTERECTOMY FOR UTERUS GREATER THAN 250G WITH BILATERAL SALPINGO OOPHORECTOMY;  Surgeon: Everitt Amber, MD;  Location: [facility];  Service: Gynecology;  Laterality: N/A;   TUBAL LIGATION  05/05/2011    Allergies: Patient has no known allergies.  Medications: Prior to Admission medications   Medication Sig Start Date End Date Taking? Authorizing Provider  aspirin 81 MG EC tablet Take by mouth daily.    [provider]  cetirizine (ZYRTEC) 10 MG tablet Take 10 mg by mouth daily.    [provider]  Cholecalciferol (VITAMIN D3 PO) Take 1 tablet by mouth daily.    [provider]  fluticasone (FLONASE) 50 MCG/ACT nasal spray Place 2 sprays into both nostrils 2  (two) times daily as needed for allergies or rhinitis.    [provider]  lidocaine-prilocaine (EMLA) cream Apply to affected area once 03/31/22   [name], MD  losartan-hydrochlorothiazide (HYZAAR) 100-25 MG tablet Take 1 tablet by mouth daily. 03/09/21   [provider]  Multiple Vitamins-Minerals (ZINC PO) Take 50 mg by mouth daily.    [provider]  omeprazole (PRILOSEC) 20 MG capsule Take 1 capsule (20 mg total) by mouth 2 (two) times daily. 10/07/11 06/03/21  Setzer, Rona Ravens, NP  ondansetron (ZOFRAN) 8 MG tablet Take 1 tablet (8 mg total) by mouth every 8 (eight) hours as needed. Start on the third day after chemotherapy. 03/31/22   [name], MD  prochlorperazine (COMPAZINE) 10 MG tablet Take 1 tablet (10 mg total) by mouth every 6 (six) hours as needed (Nausea or vomiting). 03/31/22   [name], MD  triamcinolone cream (KENALOG) 0.1 % Apply 1 application topically 2 (two) times daily as needed (psoriasis). 02/27/21   [provider]     Family History  Problem Relation Age of Onset   Colon cancer Maternal Uncle    Pancreatic cancer Maternal Grandmother     Social History   Socioeconomic History   Marital status: Married    Spouse name: Not on file   Number of children: Not on file   Years of education: Not on file   Highest education level: Not on file  Occupational History   Not on file  Tobacco Use   Smoking status: Never  Smokeless tobacco: Never  Vaping Use   Vaping Use: Never used  Substance and Sexual Activity   Alcohol use: No    Comment: Occasional   Drug use: No   Sexual activity: Yes  Other Topics Concern   Not on file  Social History Narrative   Not on file   Social Determinants of Health   Financial Resource Strain: Not on file  Food Insecurity: Not on file  Transportation Needs: Not on file  Physical Activity: Not on file  Stress: Not on file  Social Connections: Not on file      Review of Systems  currently denies fever, headache, chest pain, dyspnea, cough, abdominal/back pain, nausea, vomiting or bleeding. She is anxious.  Vital Signs: BP (!) 170/93 (BP Location: Right Arm)   Pulse 100   Temp 98.3 F (36.8 C) (Oral)   Resp 15   SpO2 99%   Physical Exam awake, alert.  Chest clear to auscultation bilaterally.  Heart with slightly tachycardic but regular rhythm.  Abdomen soft, positive bowel sounds, nontender.  No lower extremity edema.  Imaging: CT CHEST ABDOMEN PELVIS W CONTRAST  Result Date: 03/18/2022 CLINICAL DATA:  History of uterine sarcoma. Restaging. * Tracking Code: BO * EXAM: CT CHEST, ABDOMEN, AND PELVIS WITH CONTRAST TECHNIQUE: Multidetector CT imaging of the chest, abdomen and pelvis was performed following the standard protocol during bolus administration of intravenous contrast. RADIATION DOSE REDUCTION: This exam was performed according to the departmental dose-optimization program which includes automated exposure control, adjustment of the mA and/or kV according to patient size and/or use of iterative reconstruction technique. CONTRAST:  168m OMNIPAQUE IOHEXOL 300 MG/ML  SOLN COMPARISON:  Abdomen pelvis CT 10/05/2021.  Chest CT 09/19/2021 FINDINGS: CT CHEST FINDINGS Cardiovascular: The heart size is normal. No substantial pericardial effusion. No thoracic aortic aneurysm. No substantial atherosclerosis of the thoracic aorta. Mediastinum/Nodes: No mediastinal lymphadenopathy. There is no hilar lymphadenopathy. The esophagus has normal imaging features. There is no axillary lymphadenopathy. Lungs/Pleura: Index 9 mm right lower lobe subpleural nodule seen previously is stable on 71/4. 4 mm index left lower lobe subpleural nodule measured previously is stable on 86/4 today. Additional scattered subpleural and perifissural nodules are stable, including plaque-like opacity along the minor fissure (71/4). 2 mm left upper lobe parenchymal nodule on 71/4 is new since prior. No focal  consolidation.  No pleural effusion. Musculoskeletal: No worrisome lytic or sclerotic osseous abnormality. CT ABDOMEN PELVIS FINDINGS Hepatobiliary: 10 mm hypodensity in the dome of the liver is unchanged, compatible with benign etiology such as a cyst. Liver parenchyma otherwise unremarkable. There is no evidence for gallstones, gallbladder wall thickening, or pericholecystic fluid. No intrahepatic or extrahepatic biliary dilation. Pancreas: No focal mass lesion. No dilatation of the main duct. No intraparenchymal cyst. No peripancreatic edema. Spleen: No splenomegaly. No focal mass lesion. Adrenals/Urinary Tract: No adrenal nodule or mass. Kidneys unremarkable. Stable appearance tiny probable cyst lower pole right kidney. No followup recommended. No evidence for hydroureter. The urinary bladder appears normal for the degree of distention. Stomach/Bowel: Stomach is unremarkable. No gastric wall thickening. No evidence of outlet obstruction. Duodenum is normally positioned as is the ligament of Treitz. No small bowel wall thickening. No small bowel dilatation. The terminal ileum is normal. The appendix is normal. No gross colonic mass. No colonic wall thickening. Vascular/Lymphatic: There is mild atherosclerotic calcification of the abdominal aorta without aneurysm. There is no gastrohepatic or hepatoduodenal ligament lymphadenopathy. No retroperitoneal or mesenteric lymphadenopathy. No pelvic sidewall lymphadenopathy.8 mm short axis  left groin node identified previously is stable on image 117/2 today. Reproductive: Uterus surgically absent.  There is no adnexal mass. Other: 10 mm soft tissue nodule in the anterior pelvis on image 112/2 is new in the interval. 7 mm soft tissue nodule adjacent to the sigmoid colon on 110/2 is new since prior . Musculoskeletal: No worrisome lytic or sclerotic osseous abnormality. IMPRESSION: 1. Interval development of 2 soft tissue nodules in the anterior pelvis measuring 10 mm and 7  mm respectively. Close follow-up recommended to exclude metastatic disease. PET-CT may be warranted to further evaluate. 2. Development of 2 mm left upper lobe parenchymal nodule, nonspecific. Close attention on follow-up imaging recommended 3. Stable appearance of index bilateral pulmonary nodules identified previously. 4. Aortic Atherosclerosis (ICD10-I70.0). Electronically Signed   By: Misty Stanley M.D.   On: 03/18/2022 05:42   NM PET Image Initial (PI) Skull Base To Thigh (F-18 FDG)  Result Date: 04/03/2022 CLINICAL DATA:  Subsequent treatment strategy for uterine sarcoma. EXAM: NUCLEAR MEDICINE PET SKULL BASE TO THIGH TECHNIQUE: 11.17 mCi F-18 FDG was injected intravenously. Full-ring PET imaging was performed from the skull base to thigh after the radiotracer. CT data was obtained and used for attenuation correction and anatomic localization. Fasting blood glucose: 128 mg/dl COMPARISON:  CT chest, abdomen and pelvis from 03/17/2022 FINDINGS: Mediastinal blood pool activity: SUV max 2.95 Liver activity: SUV max NA NECK: No hypermetabolic lymph nodes in the neck. Incidental CT findings: none CHEST: No tracer avid axillary, supraclavicular, mediastinal, or hilar lymph nodes. No tracer avid pulmonary nodules identified. No change in the appearance of scattered subcentimeter lung nodules which are technically too small to characterize by PET-CT. Incidental CT findings: Mild aortic atherosclerosis. ABDOMEN/PELVIS: No abnormal tracer uptake identified within the liver, pancreas, spleen, or adrenal glands. Multiple tracer avid peritoneal nodules are identified. -index nodule in the left iliac fossa measures 4 mm and has an SUV max 8.55, image 165/4. Previously this measured 3 mm. -Tracer avid soft tissue nodule within the anterior pelvis measures 2.1 cm with an SUV max 47.5, image 170/4. -Previously 1.5 cm. At least 5 additional tracer avid peritoneal nodules are identified within the pelvis. -FDG avid tumor  within the vaginal cuff measures 2.3 x 1.5 cm with SUV max 56.32, image 182/4. On the previous exam there was a subtle area of soft tissue thickening in this area measuring 1.7 x 1.2 cm. Incidental CT findings: Status post hysterectomy. SKELETON: Tracer avid lesion within the right biceps femoris muscle is identified within SUV max of 30.15. This is essentially occult on the corresponding CT images, image 219/4. No tracer avid osseous metastases. Incidental CT findings: none IMPRESSION: 1. Signs of local tumor recurrence noted at the vaginal cuff. 2. 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 New since 10/13/2021 and progressive when compared with 03/17/2022. 3. New focal area of increased uptake within the right biceps femoris muscle which is suspicious for skeletal muscle involvement. 4. Stable appearance of small pulmonary nodules described on 03/17/2022. These are too small to characterize by PET-CT. Electronically Signed   By: Kerby Moors M.D.   On: 04/03/2022 08:51    Labs:  CBC: Recent Labs    06/02/21 0835  WBC 5.9  HGB 13.0  HCT 38.9  PLT 317    COAGS: Recent Labs    05/20/21 1154  INR 1.0  APTT 27    BMP: Recent Labs    05/20/21 1154 06/02/21 0835 09/15/21 0911 03/16/22 1015  NA 139  137 136 135  K 3.9 3.7 3.7 4.0  CL 104 101 103 101  CO2 '26 27 25 28  '$ GLUCOSE 113* 119* 107* 119*  BUN '10 14 15 14  '$ CALCIUM 9.8 9.4 9.2 9.4  CREATININE 0.75 0.66 0.73 0.79  GFRNONAA >60 >60 >60 >60    LIVER FUNCTION TESTS: Recent Labs    05/20/21 1154 03/16/22 1015  BILITOT 0.4 0.4  AST 24 15  ALT 37 20  ALKPHOS 72 65  PROT 7.1 7.1  ALBUMIN 4.0 4.3    TUMOR MARKERS: No results for input(s): AFPTM, CEA, CA199, CHROMGRNA in the last 8760 hours.  Assessment and Plan: 61 y.o. female with past medical history significant for anemia, arthritis, mild asthma, GERD, hypertension, migraine headaches and uterine sarcoma diagnosed in 2022  with prior hysterectomy on 06/03/2021.  She now has evidence of recurrence and presents today for Port-A-Cath placement to assist with treatment.Risks and benefits of image guided port-a-catheter placement was discussed with the patient including, but not limited to bleeding, infection, pneumothorax, or fibrin sheath development and need for additional procedures.  All of the patient's questions were answered, patient is agreeable to proceed. Consent signed and in chart.    Thank you for this interesting consult.  I greatly enjoyed meeting Leah Diaz and look forward to participating in their care.  A copy of this report was sent to the requesting provider on this date.  Electronically Signed: D. Rowe Robert, PA-C 04/08/2022, 8:38 AM   I spent a total of  25 minutes   in face to face in clinical consultation, greater than 50% of which was counseling/coordinating care for Port-A-Cath placement

## 2022-04-08 NOTE — Procedures (Signed)
  Procedure:  [place] and fluoro guided Rt IJ  Chest port insertion  Preprocedure diagnosis: Uterine Ca Postprocedure diagnosis: same EBL:    minimal Complications:   none immediate  See full dictation in [facility].  Frazier Richards, MD Main # [phone] Mobile [phone]

## 2022-04-08 NOTE — Progress Notes (Signed)
Pharmacist Chemotherapy Monitoring - Initial Assessment    Anticipated start date: 04/16/22   The following has been reviewed per standard work regarding the patient's treatment regimen: The patient's diagnosis, treatment plan and drug doses, and organ/hematologic function Lab orders and baseline tests specific to treatment regimen  The treatment plan start date, drug sequencing, and pre-medications Prior authorization status  Patient's documented medication list, including drug-drug interaction screen and prescriptions for anti-emetics and supportive care specific to the treatment regimen The drug concentrations, fluid compatibility, administration routes, and timing of the medications to be used The patient's access for treatment and lifetime cumulative dose history, if applicable  The patient's medication allergies and previous infusion related reactions, if applicable   Changes made to treatment plan:  N/A  Follow up needed:  Pending authorization for treatment    Larene Beach, [place], 04/08/2022  11:49 AM

## 2022-04-09 ENCOUNTER — Ambulatory Visit (HOSPITAL_COMMUNITY)
Admission: RE | Admit: 2022-04-09 | Discharge: 2022-04-09 | Disposition: A | Discharge status: Home / Self Care | Payer: Managed Care, Other (non HMO) | Source: Ambulatory Visit | Attending: Hematology and Oncology | Admitting: Hematology and Oncology

## 2022-04-09 DIAGNOSIS — C549 Malignant neoplasm of corpus uteri, unspecified: Secondary | ICD-10-CM | POA: Diagnosis not present

## 2022-04-09 DIAGNOSIS — Z01818 Encounter for other preprocedural examination: Secondary | ICD-10-CM | POA: Diagnosis not present

## 2022-04-09 DIAGNOSIS — I517 Cardiomegaly: Secondary | ICD-10-CM | POA: Insufficient documentation

## 2022-04-09 DIAGNOSIS — Z0189 Encounter for other specified special examinations: Secondary | ICD-10-CM

## 2022-04-09 LAB — ECHOCARDIOGRAM COMPLETE
AR max vel: 2.7 cm2
AV Area VTI: 2.55 cm2
AV Area mean vel: 2.33 cm2
AV Mean grad: 4 mmHg
AV Peak grad: 6.4 mmHg
Ao pk vel: 1.26 m/s
Area-P 1/2: 3.68 cm2
Calc EF: 51.8 %
MV VTI: 2.46 cm2
S' Lateral: 1.9 cm
Single Plane A2C EF: 45.2 %
Single Plane A4C EF: 54.5 %

## 2022-04-09 NOTE — Progress Notes (Signed)
*  PRELIMINARY RESULTS* Echocardiogram 2D Echocardiogram has been performed.  Leah Diaz 04/09/2022, 11:27 AM

## 2022-04-15 ENCOUNTER — Telehealth: Payer: Self-pay | Admitting: Oncology

## 2022-04-15 MED FILL — Dexamethasone Sodium Phosphate Inj 100 MG/10ML: INTRAMUSCULAR | Qty: 1 | Status: AC

## 2022-04-15 MED FILL — Fosaprepitant Dimeglumine For IV Infusion 150 MG (Base Eq): INTRAVENOUS | Qty: 5 | Status: AC

## 2022-04-15 NOTE — Telephone Encounter (Signed)
Leah Diaz called and wanted to talk about possibly using the [place] for her treatment tomorrow.  Discussed that it is too late for her to get a kit and card for tomorrow but that we may have a spare kit in the infusion room.  Discussed the cost and the discomfort of her head being cold during treatment.  Also discussed that it may not be successful in preventing hair loss but that it may grow back faster. After talking about, Leah Diaz has decided not to use the [employer].

## 2022-04-16 ENCOUNTER — Inpatient Hospital Stay: Payer: Managed Care, Other (non HMO) | Attending: Gynecologic Oncology

## 2022-04-16 ENCOUNTER — Inpatient Hospital Stay: Payer: Managed Care, Other (non HMO)

## 2022-04-16 ENCOUNTER — Encounter: Payer: Self-pay | Admitting: Hematology and Oncology

## 2022-04-16 ENCOUNTER — Inpatient Hospital Stay (HOSPITAL_BASED_OUTPATIENT_CLINIC_OR_DEPARTMENT_OTHER): Payer: Managed Care, Other (non HMO) | Admitting: Hematology and Oncology

## 2022-04-16 DIAGNOSIS — Z5111 Encounter for antineoplastic chemotherapy: Secondary | ICD-10-CM | POA: Diagnosis present

## 2022-04-16 DIAGNOSIS — C549 Malignant neoplasm of corpus uteri, unspecified: Secondary | ICD-10-CM

## 2022-04-16 DIAGNOSIS — R110 Nausea: Secondary | ICD-10-CM | POA: Diagnosis not present

## 2022-04-16 DIAGNOSIS — K1231 Oral mucositis (ulcerative) due to antineoplastic therapy: Secondary | ICD-10-CM | POA: Insufficient documentation

## 2022-04-16 LAB — CMP (CANCER CENTER ONLY)
ALT: 19 U/L (ref 0–44)
AST: 16 U/L (ref 15–41)
Albumin: 4.3 g/dL (ref 3.5–5.0)
Alkaline Phosphatase: 74 U/L (ref 38–126)
Anion gap: 8 (ref 5–15)
BUN: 11 mg/dL (ref 8–23)
CO2: 26 mmol/L (ref 22–32)
Calcium: 9.8 mg/dL (ref 8.9–10.3)
Chloride: 102 mmol/L (ref 98–111)
Creatinine: 0.72 mg/dL (ref 0.44–1.00)
GFR, Estimated: >60 mL/min (ref >60)
Glucose, Bld: 143 mg/dL — ABNORMAL HIGH (ref 70–99)
Potassium: 3.6 mmol/L (ref 3.5–5.1)
Sodium: 136 mmol/L (ref 135–145)
Total Bilirubin: 0.3 mg/dL (ref 0.3–1.2)
Total Protein: 7.1 g/dL (ref 6.5–8.1)

## 2022-04-16 LAB — CBC WITH DIFFERENTIAL (CANCER CENTER ONLY)
Abs Immature Granulocytes: 0.05 10*3/uL (ref 0.00–0.07)
Basophils Absolute: 0 10*3/uL (ref 0.0–0.1)
Basophils Relative: 1 %
Eosinophils Absolute: 0.4 10*3/uL (ref 0.0–0.5)
Eosinophils Relative: 6 %
HCT: 35.8 % — ABNORMAL LOW (ref 36.0–46.0)
Hemoglobin: 12.5 g/dL (ref 12.0–15.0)
Immature Granulocytes: 1 %
Lymphocytes Relative: 30 %
Lymphs Abs: 2 10*3/uL (ref 0.7–4.0)
MCH: 29.6 pg (ref 26.0–34.0)
MCHC: 34.9 g/dL (ref 30.0–36.0)
MCV: 84.6 fL (ref 80.0–100.0)
Monocytes Absolute: 0.5 10*3/uL (ref 0.1–1.0)
Monocytes Relative: 8 %
Neutro Abs: 3.7 10*3/uL (ref 1.7–7.7)
Neutrophils Relative %: 54 %
Platelet Count: 327 10*3/uL (ref 150–400)
RBC: 4.23 MIL/uL (ref 3.87–5.11)
RDW: 13.5 % (ref 11.5–15.5)
WBC Count: 6.8 10*3/uL (ref 4.0–10.5)
nRBC: 0 % (ref 0.0–0.2)

## 2022-04-16 MED ORDER — SODIUM CHLORIDE 0.9 % IV SOLN: Freq: Once | INTRAVENOUS | Status: AC

## 2022-04-16 MED ORDER — SODIUM CHLORIDE 0.9 % IV SOLN
10.0000 mg | Freq: Once | INTRAVENOUS | Status: AC
  Administered 2022-04-16: 10 mg via INTRAVENOUS
  Filled 2022-04-16: qty 10

## 2022-04-16 MED ORDER — SODIUM CHLORIDE 0.9% FLUSH
10.0000 mL | INTRAVENOUS | Status: DC | PRN
  Administered 2022-04-16: 10 mL

## 2022-04-16 MED ORDER — SODIUM CHLORIDE 0.9 % IV SOLN
150.0000 mg | Freq: Once | INTRAVENOUS | Status: AC
  Administered 2022-04-16: 150 mg via INTRAVENOUS
  Filled 2022-04-16: qty 150

## 2022-04-16 MED ORDER — PALONOSETRON HCL INJECTION 0.25 MG/5ML
0.2500 mg | Freq: Once | INTRAVENOUS | Status: AC
  Administered 2022-04-16: 0.25 mg via INTRAVENOUS
  Filled 2022-04-16: qty 5

## 2022-04-16 MED ORDER — DOXORUBICIN HCL CHEMO IV INJECTION 2 MG/ML
60.0000 mg/m2 | Freq: Once | INTRAVENOUS | Status: AC
  Administered 2022-04-16: 132 mg via INTRAVENOUS
  Filled 2022-04-16: qty 66

## 2022-04-16 MED ORDER — SODIUM CHLORIDE 0.9% FLUSH
10.0000 mL | Freq: Once | INTRAVENOUS | Status: AC
  Administered 2022-04-16: 10 mL

## 2022-04-16 MED ORDER — HEPARIN SOD (PORK) LOCK FLUSH 100 UNIT/ML IV SOLN
500.0000 [IU] | Freq: Once | INTRAVENOUS | Status: AC | PRN
  Administered 2022-04-16: 500 [IU]

## 2022-04-16 NOTE — Assessment & Plan Note (Signed)
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

## 2022-04-16 NOTE — Progress Notes (Signed)
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   All questions were answered. The patient knows to call the clinic with any problems, questions or concerns. The total time spent in the appointment was 20 minutes encounter with patients including review of chart and various tests results, discussions about plan of care and coordination of care plan   [name], MD 04/16/2022 10:11 AM  INTERVAL HISTORY: Please see below for problem oriented charting. she returns for treatment follow-up seen prior to cycle 1 of treatment She is doing well Port placement was uneventful We reviewed medication side effects, what to expect and discussed risk of constipation with treatment  REVIEW OF SYSTEMS:   Constitutional: Denies fevers, chills or abnormal weight loss Eyes: Denies blurriness of vision Ears, nose, mouth, throat, and face: Denies mucositis or sore throat Respiratory: Denies cough, dyspnea or wheezes Cardiovascular: Denies palpitation, chest discomfort or lower extremity swelling Gastrointestinal:  Denies nausea, heartburn or change in bowel habits Skin: Denies abnormal skin rashes Lymphatics: Denies new lymphadenopathy or easy bruising Neurological:Denies numbness, tingling or new weaknesses Behavioral/Psych: Mood is stable, no new changes   All other systems were reviewed with the patient and are negative.  I have reviewed the past medical history, past surgical history, social history and family history with the patient and they are unchanged from previous note.  ALLERGIES:  has No Known Allergies.  MEDICATIONS:  Current Outpatient Medications  Medication Sig Dispense Refill   aspirin 81 MG EC tablet Take by mouth daily.     cetirizine (ZYRTEC) 10 MG tablet Take 10 mg by mouth daily.     Cholecalciferol (VITAMIN D3 PO) Take 1 tablet by mouth daily.     fluticasone (FLONASE) 50 MCG/ACT nasal spray Place 2 sprays into both nostrils 2 (two) times daily as needed for allergies or rhinitis.     lidocaine-prilocaine (EMLA) cream Apply to affected area once 30 g 3   losartan-hydrochlorothiazide (HYZAAR) 100-25 MG tablet Take 1 tablet by mouth daily.     Multiple Vitamins-Minerals (ZINC PO) Take 50 mg by mouth daily.     omeprazole (PRILOSEC) 20 MG capsule Take 1 capsule (20 mg total) by mouth 2 (two) times daily. 28 capsule 0   ondansetron (ZOFRAN) 8 MG tablet Take 1 tablet (8 mg total) by mouth every 8 (eight) hours as needed. Start on the third day after chemotherapy. 30 tablet 1   prochlorperazine (COMPAZINE) 10 MG tablet Take 1 tablet (10 mg total) by mouth every 6 (six) hours as needed (Nausea or vomiting). 30 tablet 1   triamcinolone cream (KENALOG) 0.1 % Apply 1 application topically 2 (two) times daily as needed (psoriasis).     No current facility-administered medications for this visit.   Facility-Administered Medications Ordered in Other Visits  Medication Dose Route Frequency Provider Last Rate Last Admin   DOXOrubicin (ADRIAMYCIN) chemo injection 132 mg  60 mg/m2 (Treatment Plan  Recorded) Intravenous Once [name], [name], MD       heparin lock flush 100 unit/mL  500 Units Intracatheter Once PRN [name], [name], MD       sodium chloride flush (NS) 0.9 % injection 10 mL  10 mL Intracatheter PRN [name], MD         SUMMARY OF ONCOLOGIC HISTORY: Oncology History Overview Note  High grade and LMS, 50% ER/PR positive dMMR normal, PD-L1 CPS 3%   Uterine sarcoma ([place])  06/02/2021 Imaging   MRI pelvis Heterogeneous 9.0 cm intrauterine mass within the intramural/submucosal anterior fundus demonstrating interval growth, internal enhancement, and restricted diffusion 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 No extra uterine extension. No evidence of metastatic disease within the pelvis.   Mild thickening of the endometrial stripe, likely related to entrapment by the adjacent uterine mass   06/03/2021 Initial Diagnosis   Uterine sarcoma ([place])    06/03/2021 Cancer Staging   Staging form: Corpus Uteri - Sarcoma, AJCC 7th Edition - Clinical stage from 06/03/2021: FIGO Stage IVB (rT1b, N0, M1) - Signed by [name], MD on 03/31/2022 Diagnostic confirmation: Positive histology Stage prefix: Recurrence Biopsy of metastatic site performed: No Lymph-vascular invasion (LVI): LVI present/identified, NOS    06/03/2021 Pathology Results   FINAL MICROSCOPIC DIAGNOSIS:   A. UTERUS, CERVIX, BILATERAL FALLOPIAN TUBES AND OVARIES, HYSTERECTOMY:  - Uterus:       Mixed high grade uterine sarcoma, spanning 6 cm, see comment.       Extensive lymphovascular invasion.       See oncology table.  - Cervix: Benign squamous and endocervical mucosa. No dysplasia or  malignancy.  - Bilateral ovaries: Unremarkable. No malignancy.  - Bilateral fallopian tubes: Unremarkable. No malignancy.   ONCOLOGY TABLE:   UTERUS, SARCOMA: Resection   Procedure: Total hysterectomy and bilateral salpingo-oophorectomy  Specimen Integrity: Focally disrupted on posterior surface  Tumor Site: Anterior wall  Tumor Size: 6 cm  Histologic Type: High grade sarcoma, mixed. See comment.  Other Tissue/ Organ Involvement: Not identified  Lymphovascular Invasion: Present, extensive.  Margins: All margins  negative for tumor  Regional Lymph Nodes: Not applicable (no lymph nodes submitted or found)  Distant Metastasis:       Distant Site(s) Involved: Not applicable  Pathologic Stage Classification (pTNM, AJCC 8th Edition): pT1b, pN not  assigned  Ancillary Studies: Can be performed upon request  Representative Tumor Block: A6  Comment(s): The tumor has two morphologically different components. One component consists of malignant spindle cells which can be seen arising from the smooth muscle. These foci are positive for SMA, desmin, cyclinD1 (focal), and CD10 (patchy). The other component consists of round to slightly spindle cells with abundant admixed vessels and occasional large pleomorphic cells. These areas are positive for SMA (patchy weak), desmin (focal), CD10 (variable with diffuse areas). Pancytokeratin is negative. 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   ADDENDUM:   PROGNOSTIC INDICATOR RESULTS:   Immunohistochemical and morphometric analysis performed manually    Estrogen Receptor:       POSITIVE, 50%, WEAK TO MODERATE STAINING  Progesterone Receptor:   POSITIVE, 50%, MODERATE TO STRONG STAINING   Reference Range Estrogen and Progesterone Receptor       Negative  0%       Positive  >1%    06/03/2021 Surgery   Surgeon: Donaciano Eva    Assistants: Dr Lahoma Crocker (an MD assistant was necessary for tissue manipulation,  management of robotic instrumentation, retraction and positioning due to the complexity of the case and hospital policies).  Operation: Robotic-assisted laparoscopic total hysterectomy >250gm with bilateral salpingoophorectomy, minilaparotomy for specimen delivery   Surgeon: Donaciano Eva    Operative Findings:  : Bulky 16cm uterus with intra-uterine mass (consistent with a fibroid-like mass) , smooth normal appearing serosa, no suspicious bulky nodes. Normal ovaries  bilaterally. Normal upper abdomen. Uterus too large to deliver vaginally in tact.    06/18/2021 Imaging   1. Multiple bilateral pulmonary nodules, measuring up to 9 mm. Most of these are perifissural and subpleural in location, likely lymph nodes. Nevertheless, close follow-up recommended to exclude metastatic disease. 2. 11 mm hypoattenuating lesion towards the dome of the liver, indeterminate. MRI abdomen with and without contrast could be used to further evaluate as clinically warranted. 3. Aortic Atherosclerosis (ICD10-I70.0).   10/13/2021 Imaging   1. No acute findings within the abdomen or pelvis. No specific findings identified to suggest residual or recurrence of tumor. 2. Stable small pulmonary nodules within the left lower lobe.   03/18/2022 Imaging   1. Interval development of 2 soft tissue nodules in the anterior pelvis measuring 10 mm and 7 mm respectively. Close follow-up recommended to exclude metastatic disease. PET-CT may be warranted to further evaluate. 2. Development of 2 mm left upper lobe parenchymal nodule, nonspecific. Close attention on follow-up imaging recommended  3. Stable appearance of index bilateral pulmonary nodules identified previously. 4. Aortic Atherosclerosis (ICD10-I70.0).   04/02/2022 PET scan   1. Signs of local tumor recurrence noted at the vaginal cuff. 2. 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 New since 10/13/2021 and progressive when compared with 03/17/2022. 3. New focal area of increased uptake within the right biceps femoris muscle which is suspicious for skeletal muscle involvement. 4. Stable appearance of small pulmonary nodules described on 03/17/2022. These are too small to characterize by PET-CT.   04/09/2022 Procedure   Successful placement of a right IJ approach Power Port with ultrasound and fluoroscopic guidance. The catheter is ready for use.   04/09/2022 Echocardiogram    1. Left ventricular  ejection fraction, by estimation, is 65 to 70%. The left ventricle has normal function. The left ventricle has no regional wall motion abnormalities. There is mild concentric left ventricular hypertrophy. Left ventricular diastolic parameters are consistent with Grade I diastolic dysfunction (impaired relaxation).  2. Right ventricular systolic function is normal. The right ventricular size is normal.  3. The mitral valve is normal in structure. No evidence of mitral valve regurgitation.  4. The aortic valve is tricuspid. Aortic valve regurgitation is not visualized. No aortic stenosis is present.  5. The inferior vena cava is normal in size with greater than 50% respiratory variability, suggesting right atrial pressure of 3 mmHg.   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        PHYSICAL EXAMINATION: ECOG PERFORMANCE STATUS: 0 - Asymptomatic  Vitals:   04/16/22 0811  BP: (!) 136/52  Pulse: 97  Resp: 18  Temp: 97.7 F (36.5 C)  SpO2: 97%   Filed Weights   04/16/22 0811  Weight: 226 lb 6.4 oz (102.7 kg)    GENERAL:alert, no distress and comfortable SKIN: skin color, texture, turgor are normal, no rashes or significant lesions EYES: normal, Conjunctiva are pink and non-injected, sclera clear OROPHARYNX:no exudate, no erythema and lips, buccal mucosa, and tongue normal  NECK: supple, thyroid  normal size, non-tender, without nodularity LYMPH:  no palpable lymphadenopathy in the cervical, axillary or inguinal LUNGS: clear to auscultation and percussion with normal breathing effort HEART: regular rate & rhythm and no murmurs and no lower extremity edema ABDOMEN:abdomen soft, non-tender and normal bowel sounds Musculoskeletal:no cyanosis of digits and no clubbing  NEURO: alert & oriented x 3 with fluent speech, no focal motor/sensory deficits  LABORATORY DATA:  I have reviewed the data as listed    Component  Value Date/Time   NA 136 04/16/2022 0736   K 3.6 04/16/2022 0736   CL 102 04/16/2022 0736   CO2 26 04/16/2022 0736   GLUCOSE 143 (H) 04/16/2022 0736   BUN 11 04/16/2022 0736   CREATININE 0.72 04/16/2022 0736   CALCIUM 9.8 04/16/2022 0736   PROT 7.1 04/16/2022 0736   ALBUMIN 4.3 04/16/2022 0736   AST 16 04/16/2022 0736   ALT 19 04/16/2022 0736   ALKPHOS 74 04/16/2022 0736   BILITOT 0.3 04/16/2022 0736   GFRNONAA >60 04/16/2022 0736    No results found for: SPEP, UPEP  Lab Results  Component Value Date   WBC 6.8 04/16/2022   NEUTROABS 3.7 04/16/2022   HGB 12.5 04/16/2022   HCT 35.8 (L) 04/16/2022   MCV 84.6 04/16/2022   PLT 327 04/16/2022      Chemistry      Component Value Date/Time   NA 136 04/16/2022 0736   K 3.6 04/16/2022 0736   CL 102 04/16/2022 0736   CO2 26 04/16/2022 0736   BUN 11 04/16/2022 0736   CREATININE 0.72 04/16/2022 0736      Component Value Date/Time   CALCIUM 9.8 04/16/2022 0736   ALKPHOS 74 04/16/2022 0736   AST 16 04/16/2022 0736   ALT 19 04/16/2022 0736   BILITOT 0.3 04/16/2022 0736       RADIOGRAPHIC STUDIES: I have personally reviewed the radiological images as listed and agreed with the findings in the report. CT CHEST ABDOMEN PELVIS W CONTRAST  Result Date: 03/18/2022 CLINICAL DATA:  History of uterine sarcoma. Restaging. * Tracking Code: BO * EXAM: CT CHEST, ABDOMEN, AND PELVIS WITH CONTRAST TECHNIQUE: Multidetector CT imaging of the chest, abdomen and pelvis was performed following the standard protocol during bolus administration of intravenous contrast. RADIATION DOSE REDUCTION: This exam was performed according to the departmental dose-optimization program which includes automated exposure control, adjustment of the mA and/or kV according to patient size and/or use of iterative reconstruction technique. CONTRAST:  131m OMNIPAQUE IOHEXOL 300 MG/ML  SOLN COMPARISON:  Abdomen pelvis CT 10/05/2021.  Chest CT 09/19/2021 FINDINGS: CT CHEST  FINDINGS Cardiovascular: The heart size is normal. No substantial pericardial effusion. No thoracic aortic aneurysm. No substantial atherosclerosis of the thoracic aorta. Mediastinum/Nodes: No mediastinal lymphadenopathy. There is no hilar lymphadenopathy. The esophagus has normal imaging features. There is no axillary lymphadenopathy. Lungs/Pleura: Index 9 mm right lower lobe subpleural nodule seen previously is stable on 71/4. 4 mm index left lower lobe subpleural nodule measured previously is stable on 86/4 today. Additional scattered subpleural and perifissural nodules are stable, including plaque-like opacity along the minor fissure (71/4). 2 mm left upper lobe parenchymal nodule on 71/4 is new since prior. No focal consolidation.  No pleural effusion. Musculoskeletal: No worrisome lytic or sclerotic osseous abnormality. CT ABDOMEN PELVIS FINDINGS Hepatobiliary: 10 mm hypodensity in the dome of the liver is unchanged, compatible with benign etiology such as a cyst. Liver parenchyma otherwise unremarkable. There is no evidence for gallstones, gallbladder wall thickening, or  pericholecystic fluid. No intrahepatic or extrahepatic biliary dilation. Pancreas: No focal mass lesion. No dilatation of the main duct. No intraparenchymal cyst. No peripancreatic edema. Spleen: No splenomegaly. No focal mass lesion. Adrenals/Urinary Tract: No adrenal nodule or mass. Kidneys unremarkable. Stable appearance tiny probable cyst lower pole right kidney. No followup recommended. No evidence for hydroureter. The urinary bladder appears normal for the degree of distention. Stomach/Bowel: Stomach is unremarkable. No gastric wall thickening. No evidence of outlet obstruction. Duodenum is normally positioned as is the ligament of Treitz. No small bowel wall thickening. No small bowel dilatation. The terminal ileum is normal. The appendix is normal. No gross colonic mass. No colonic wall thickening. Vascular/Lymphatic: There is mild  atherosclerotic calcification of the abdominal aorta without aneurysm. There is no gastrohepatic or hepatoduodenal ligament lymphadenopathy. No retroperitoneal or mesenteric lymphadenopathy. No pelvic sidewall lymphadenopathy.8 mm short axis left groin node identified previously is stable on image 117/2 today. Reproductive: Uterus surgically absent.  There is no adnexal mass. Other: 10 mm soft tissue nodule in the anterior pelvis on image 112/2 is new in the interval. 7 mm soft tissue nodule adjacent to the sigmoid colon on 110/2 is new since prior . Musculoskeletal: No worrisome lytic or sclerotic osseous abnormality. IMPRESSION: 1. Interval development of 2 soft tissue nodules in the anterior pelvis measuring 10 mm and 7 mm respectively. Close follow-up recommended to exclude metastatic disease. PET-CT may be warranted to further evaluate. 2. Development of 2 mm left upper lobe parenchymal nodule, nonspecific. Close attention on follow-up imaging recommended 3. Stable appearance of index bilateral pulmonary nodules identified previously. 4. Aortic Atherosclerosis (ICD10-I70.0). Electronically Signed   By: Misty Stanley M.D.   On: 03/18/2022 05:42   NM PET Image Initial (PI) Skull Base To Thigh (F-18 FDG)  Result Date: 04/03/2022 CLINICAL DATA:  Subsequent treatment strategy for uterine sarcoma. EXAM: NUCLEAR MEDICINE PET SKULL BASE TO THIGH TECHNIQUE: 11.17 mCi F-18 FDG was injected intravenously. Full-ring PET imaging was performed from the skull base to thigh after the radiotracer. CT data was obtained and used for attenuation correction and anatomic localization. Fasting blood glucose: 128 mg/dl COMPARISON:  CT chest, abdomen and pelvis from 03/17/2022 FINDINGS: Mediastinal blood pool activity: SUV max 2.95 Liver activity: SUV max NA NECK: No hypermetabolic lymph nodes in the neck. Incidental CT findings: none CHEST: No tracer avid axillary, supraclavicular, mediastinal, or hilar lymph nodes. No tracer avid  pulmonary nodules identified. No change in the appearance of scattered subcentimeter lung nodules which are technically too small to characterize by PET-CT. Incidental CT findings: Mild aortic atherosclerosis. ABDOMEN/PELVIS: No abnormal tracer uptake identified within the liver, pancreas, spleen, or adrenal glands. Multiple tracer avid peritoneal nodules are identified. -index nodule in the left iliac fossa measures 4 mm and has an SUV max 8.55, image 165/4. Previously this measured 3 mm. -Tracer avid soft tissue nodule within the anterior pelvis measures 2.1 cm with an SUV max 47.5, image 170/4. -Previously 1.5 cm. At least 5 additional tracer avid peritoneal nodules are identified within the pelvis. -FDG avid tumor within the vaginal cuff measures 2.3 x 1.5 cm with SUV max 56.32, image 182/4. On the previous exam there was a subtle area of soft tissue thickening in this area measuring 1.7 x 1.2 cm. Incidental CT findings: Status post hysterectomy. SKELETON: Tracer avid lesion within the right biceps femoris muscle is identified within SUV max of 30.15. This is essentially occult on the corresponding CT images, image 219/4. No tracer avid osseous metastases. Incidental  CT findings: none IMPRESSION: 1. Signs of local tumor recurrence noted at the vaginal cuff. 2. 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 New since 10/13/2021 and progressive when compared with 03/17/2022. 3. New focal area of increased uptake within the right biceps femoris muscle which is suspicious for skeletal muscle involvement. 4. Stable appearance of small pulmonary nodules described on 03/17/2022. These are too small to characterize by PET-CT. Electronically Signed   By: Kerby Moors M.D.   On: 04/03/2022 08:51   ECHOCARDIOGRAM COMPLETE  Result Date: 04/09/2022    ECHOCARDIOGRAM REPORT   Patient Name:   Leah Diaz Date of Exam: 04/09/2022 Medical Rec #:  [id number]       Height:       67.0 in  Accession #:    [id number]      Weight:       223.0 lb Date of Birth:  12/13/1960       BSA:          2.118 m Patient Age:    20 years        BP:           145/84 mmHg Patient Gender: F               HR:           83 bpm. Exam Location:  [facility] Procedure: 2D Echo, Cardiac Doppler and Color Doppler Indications:    Chemo  History:        Patient has no prior history of Echocardiogram examinations.                 Uterine Sarcoma.  Sonographer:    Wenda Low Referring Phys: [id number] NI [place]  1. Left ventricular ejection fraction, by estimation, is 65 to 70%. The left ventricle has normal function. The left ventricle has no regional wall motion abnormalities. There is mild concentric left ventricular hypertrophy. Left ventricular diastolic parameters are consistent with Grade I diastolic dysfunction (impaired relaxation).  2. Right ventricular systolic function is normal. The right ventricular size is normal.  3. The mitral valve is normal in structure. No evidence of mitral valve regurgitation.  4. The aortic valve is tricuspid. Aortic valve regurgitation is not visualized. No aortic stenosis is present.  5. The inferior vena cava is normal in size with greater than 50% respiratory variability, suggesting right atrial pressure of 3 mmHg. Comparison(s): No prior Echocardiogram. FINDINGS  Left Ventricle: Left ventricular ejection fraction, by estimation, is 65 to 70%. The left ventricle has normal function. The left ventricle has no regional wall motion abnormalities. The left ventricular internal cavity size was normal in size. There is  mild concentric left ventricular hypertrophy. Left ventricular diastolic parameters are consistent with Grade I diastolic dysfunction (impaired relaxation). Right Ventricle: The right ventricular size is normal. No increase in right ventricular wall thickness. Right ventricular systolic function is normal. Left Atrium: Left atrial size was normal in size. Right  Atrium: Right atrial size was normal in size. Pericardium: There is no evidence of pericardial effusion. Mitral Valve: The mitral valve is normal in structure. No evidence of mitral valve regurgitation. MV peak gradient, 3.8 mmHg. The mean mitral valve gradient is 1.0 mmHg. Tricuspid Valve: The tricuspid valve is normal in structure. Tricuspid valve regurgitation is trivial. Aortic Valve: The aortic valve is tricuspid. Aortic valve regurgitation is not visualized. No aortic stenosis is present. Aortic valve mean gradient measures 4.0 mmHg. Aortic valve peak gradient measures 6.4  mmHg. Aortic valve area, by VTI measures 2.55 cm. Pulmonic Valve: The pulmonic valve was normal in structure. Pulmonic valve regurgitation is trivial. Aorta: The aortic root is normal in size and structure. Venous: The inferior vena cava is normal in size with greater than 50% respiratory variability, suggesting right atrial pressure of 3 mmHg. IAS/Shunts: The atrial septum is grossly normal.  LEFT VENTRICLE PLAX 2D LVIDd:         4.00 cm     Diastology LVIDs:         1.90 cm     LV e' medial:    7.72 cm/s LV PW:         1.10 cm     LV E/e' medial:  7.6 LV IVS:        1.10 cm     LV e' lateral:   13.80 cm/s LVOT diam:     1.90 cm     LV E/e' lateral: 4.3 LV SV:         64 LV SV Index:   30 LVOT Area:     2.84 cm  LV Volumes (MOD) LV vol d, MOD A2C: 26.3 ml LV vol d, MOD A4C: 35.8 ml LV vol s, MOD A2C: 14.4 ml LV vol s, MOD A4C: 16.3 ml LV SV MOD A2C:     11.9 ml LV SV MOD A4C:     35.8 ml LV SV MOD BP:      16.5 ml RIGHT VENTRICLE RV Basal diam:  2.75 cm RV Mid diam:    2.10 cm RV S prime:     13.60 cm/s TAPSE (M-mode): 2.4 cm LEFT ATRIUM             Index        RIGHT ATRIUM           Index LA diam:        3.30 cm 1.56 cm/m   RA Area:     11.20 cm LA Vol (A2C):   33.4 ml 15.77 ml/m  RA Volume:   23.40 ml  11.05 ml/m LA Vol (A4C):   30.9 ml 14.59 ml/m LA Biplane Vol: 33.3 ml 15.72 ml/m  AORTIC VALVE                    PULMONIC VALVE  AV Area (Vmax):    2.70 cm     PV Vmax:       0.87 m/s AV Area (Vmean):   2.33 cm     PV Peak grad:  3.0 mmHg AV Area (VTI):     2.55 cm AV Vmax:           126.00 cm/s AV Vmean:          85.500 cm/s AV VTI:            0.252 m AV Peak Grad:      6.4 mmHg AV Mean Grad:      4.0 mmHg LVOT Vmax:         120.00 cm/s LVOT Vmean:        70.200 cm/s LVOT VTI:          0.227 m LVOT/AV VTI ratio: 0.90  AORTA Ao Root diam: 2.60 cm Ao Asc diam:  2.90 cm MITRAL VALVE MV Area (PHT): 3.68 cm    SHUNTS MV Area VTI:   2.46 cm    Systemic VTI:  0.23 m MV Peak grad:  3.8 mmHg    Systemic Diam:  1.90 cm MV Mean grad:  1.0 mmHg MV Vmax:       0.97 m/s MV Vmean:      54.8 cm/s MV Decel Time: 206 msec MV E velocity: 58.70 cm/s MV A velocity: 85.60 cm/s MV E/A ratio:  0.69 Gwyndolyn Kaufman MD Electronically signed by Gwyndolyn Kaufman MD Signature Date/Time: 04/09/2022/12:49:58 PM    Final    IR IMAGING GUIDED PORT INSERTION  Result Date: 04/08/2022 CLINICAL DATA:  Patient with uterine carcinoma requires a Port-A-Cath EXAM: IR IMAGING GUIDED PORT INSERTION Date: 04/08/2022 ANESTHESIA/SEDATION: Moderate (conscious) sedation was administered during this procedure. A total of 3.5 mg Versed and 150 mg Fentanyl were administered intravenously. The patient's vital signs were monitored continuously by radiology nursing throughout the course of the procedure. Total sedation time: 28 minutes FLUOROSCOPY: 12 seconds of fluoro time: 1 mGy TECHNIQUE: The right neck and chest was prepped with chlorhexidine, and draped in the usual sterile fashion using maximum barrier technique (cap and mask, sterile gown, sterile gloves, large sterile sheet, hand hygiene and cutaneous antiseptic). Local anesthesia was attained by infiltration with 1% lidocaine and with epinephrine. Ultrasound demonstrated patency of the right internal jugular vein, and this was documented with an image. Under real-time ultrasound guidance, this vein was accessed with a 21 gauge  micropuncture needle and image documentation was performed. A small dermatotomy was made at the access site with an 11 scalpel. A 0.018" wire was advanced into the SVC and the access needle exchanged for a 75F micropuncture vascular sheath. The 0.018" wire was then removed and a 0.035" wire advanced into the IVC. An appropriate location for the subcutaneous reservoir was selected below the clavicle and an incision was made through the skin and underlying soft tissues. The subcutaneous tissues were then dissected using a combination of blunt and sharp surgical technique and a pocket was formed. A single lumen power injectable portacatheter was then tunneled through the subcutaneous tissues from the pocket to the dermatotomy and the port reservoir placed within the subcutaneous pocket. The venous access site was then serially dilated and a peel away vascular sheath placed over the wire. The wire was removed and the port catheter advanced into position under fluoroscopic guidance. The catheter tip is positioned in the upper right atrium. This was documented with a spot image. The portacatheter was then tested and found to flush and aspirate well. The port was flushed with saline followed by 100 units/mL heparinized saline. The pocket was then closed using 4-0 absorbable sutures. The epidermis was then sealed with Dermabond. The dermatotomy at the venous access site was also closed with a single inverted subdermal suture and the epidermis sealed with Dermabond. COMPLICATIONS: None.  The patient tolerated the procedure well. IMPRESSION: Successful placement of a right IJ approach Power Port with ultrasound and fluoroscopic guidance. The catheter is ready for use. Electronically Signed   By: Frazier Richards M.D.   On: 04/08/2022 11:36

## 2022-04-16 NOTE — Patient Instructions (Signed)
[facility]  Discharge Instructions: °Thank you for choosing [facility] to provide your oncology and hematology care.  ° °If you have a lab appointment with the Cancer Center, please go directly to the Cancer Center and check in at the registration area. °  °Wear comfortable clothing and clothing appropriate for easy access to any Portacath or PICC line.  ° °We strive to give you quality time with your provider. You may need to reschedule your appointment if you arrive late (15 or more minutes).  Arriving late affects you and other patients whose appointments are after yours.  Also, if you miss three or more appointments without notifying the office, you may be dismissed from the clinic at the provider’s discretion.    °  °For prescription refill requests, have your pharmacy contact our office and allow 72 hours for refills to be completed.   ° °Today you received the following chemotherapy and/or immunotherapy agents: Adriamycin    °  °To help prevent nausea and vomiting after your treatment, we encourage you to take your nausea medication as directed. ° °BELOW ARE SYMPTOMS THAT SHOULD BE REPORTED IMMEDIATELY: °*FEVER GREATER THAN 100.4 F (38 °C) OR HIGHER °*CHILLS OR SWEATING °*NAUSEA AND VOMITING THAT IS NOT CONTROLLED WITH YOUR NAUSEA MEDICATION °*UNUSUAL SHORTNESS OF BREATH °*UNUSUAL BRUISING OR BLEEDING °*URINARY PROBLEMS (pain or burning when urinating, or frequent urination) °*BOWEL PROBLEMS (unusual diarrhea, constipation, pain near the anus) °TENDERNESS IN MOUTH AND THROAT WITH OR WITHOUT PRESENCE OF ULCERS (sore throat, sores in mouth, or a toothache) °UNUSUAL RASH, SWELLING OR PAIN  °UNUSUAL VAGINAL DISCHARGE OR ITCHING  ° °Items with * indicate a potential emergency and should be followed up as soon as possible or go to the Emergency Department if any problems should occur. ° °Please show the CHEMOTHERAPY ALERT CARD or IMMUNOTHERAPY ALERT CARD at check-in to  the Emergency Department and triage nurse. ° °Should you have questions after your visit or need to cancel or reschedule your appointment, please contact [facility]  Dept: [phone]  and follow the prompts.  Office hours are 8:00 a.m. to 4:30 p.m. Monday - Friday. Please note that voicemails left after 4:00 p.m. may not be returned until the following business day.  We are closed weekends and major holidays. You have access to a nurse at all times for urgent questions. Please call the main number to the clinic Dept: [phone] and follow the prompts. ° ° °For any non-urgent questions, you may also contact your provider using MyChart. We now offer e-Visits for anyone 18 and older to request care online for non-urgent symptoms. For details visit [email or web address]. °  °Also download the MyChart app! Go to the app store, search "MyChart", open the app, select [facility], and log in with your MyChart username and password. ° °Due to Covid, a mask is required upon entering the hospital/clinic. If you do not have a mask, one will be given to you upon arrival. For doctor visits, patients may have 1 support person aged 18 or older with them. For treatment visits, patients cannot have anyone with them due to current Covid guidelines and our immunocompromised population.  ° °

## 2022-04-17 ENCOUNTER — Telehealth: Payer: Self-pay

## 2022-04-17 NOTE — Telephone Encounter (Signed)
Ms Brindley states that she is happy to report that she feels good. A little tired but no N/V.  She is eating, drinking, and urinating well. She knows to call the office at [phone] if she has any questions or concerns.

## 2022-04-17 NOTE — Telephone Encounter (Signed)
-----   Message from Anastasia Pall, RN sent at 04/16/2022 10:52 AM EDT ----- Regarding: 1st time adriamycin, being seen by Dr. Allen Derry, Mrs. Graybill received 1st adriamycin injection today. She tolerated the treatment well. Please check in on her tomorrow. Thank you.

## 2022-04-23 ENCOUNTER — Other Ambulatory Visit: Payer: Self-pay | Admitting: Gynecologic Oncology

## 2022-04-23 DIAGNOSIS — C549 Malignant neoplasm of corpus uteri, unspecified: Secondary | ICD-10-CM

## 2022-05-06 MED FILL — Fosaprepitant Dimeglumine For IV Infusion 150 MG (Base Eq): INTRAVENOUS | Qty: 5 | Status: AC

## 2022-05-06 MED FILL — Dexamethasone Sodium Phosphate Inj 100 MG/10ML: INTRAMUSCULAR | Qty: 1 | Status: AC

## 2022-05-07 ENCOUNTER — Encounter: Payer: Self-pay | Admitting: Hematology and Oncology

## 2022-05-07 ENCOUNTER — Other Ambulatory Visit: Payer: Self-pay

## 2022-05-07 ENCOUNTER — Inpatient Hospital Stay (HOSPITAL_BASED_OUTPATIENT_CLINIC_OR_DEPARTMENT_OTHER): Payer: Managed Care, Other (non HMO) | Admitting: Hematology and Oncology

## 2022-05-07 ENCOUNTER — Inpatient Hospital Stay: Payer: Managed Care, Other (non HMO)

## 2022-05-07 VITALS — HR 97

## 2022-05-07 DIAGNOSIS — R110 Nausea: Secondary | ICD-10-CM

## 2022-05-07 DIAGNOSIS — K1231 Oral mucositis (ulcerative) due to antineoplastic therapy: Secondary | ICD-10-CM | POA: Diagnosis not present

## 2022-05-07 DIAGNOSIS — C549 Malignant neoplasm of corpus uteri, unspecified: Secondary | ICD-10-CM | POA: Diagnosis not present

## 2022-05-07 DIAGNOSIS — Z5111 Encounter for antineoplastic chemotherapy: Secondary | ICD-10-CM | POA: Diagnosis not present

## 2022-05-07 LAB — CBC WITH DIFFERENTIAL (CANCER CENTER ONLY)
Abs Immature Granulocytes: 0.33 10*3/uL — ABNORMAL HIGH (ref 0.00–0.07)
Basophils Absolute: 0.2 10*3/uL — ABNORMAL HIGH (ref 0.0–0.1)
Basophils Relative: 2 %
Eosinophils Absolute: 0.3 10*3/uL (ref 0.0–0.5)
Eosinophils Relative: 4 %
HCT: 35.8 % — ABNORMAL LOW (ref 36.0–46.0)
Hemoglobin: 12.3 g/dL (ref 12.0–15.0)
Immature Granulocytes: 4 %
Lymphocytes Relative: 30 %
Lymphs Abs: 2.3 10*3/uL (ref 0.7–4.0)
MCH: 29.3 pg (ref 26.0–34.0)
MCHC: 34.4 g/dL (ref 30.0–36.0)
MCV: 85.2 fL (ref 80.0–100.0)
Monocytes Absolute: 1.1 10*3/uL — ABNORMAL HIGH (ref 0.1–1.0)
Monocytes Relative: 14 %
Neutro Abs: 3.4 10*3/uL (ref 1.7–7.7)
Neutrophils Relative %: 46 %
Platelet Count: 463 10*3/uL — ABNORMAL HIGH (ref 150–400)
RBC: 4.2 MIL/uL (ref 3.87–5.11)
RDW: 14.2 % (ref 11.5–15.5)
WBC Count: 7.7 10*3/uL (ref 4.0–10.5)
nRBC: 0 % (ref 0.0–0.2)

## 2022-05-07 LAB — CMP (CANCER CENTER ONLY)
ALT: 28 U/L (ref 0–44)
AST: 22 U/L (ref 15–41)
Albumin: 4.2 g/dL (ref 3.5–5.0)
Alkaline Phosphatase: 81 U/L (ref 38–126)
Anion gap: 10 (ref 5–15)
BUN: 10 mg/dL (ref 8–23)
CO2: 26 mmol/L (ref 22–32)
Calcium: 9.7 mg/dL (ref 8.9–10.3)
Chloride: 102 mmol/L (ref 98–111)
Creatinine: 0.73 mg/dL (ref 0.44–1.00)
GFR, Estimated: >60 mL/min (ref >60)
Glucose, Bld: 165 mg/dL — ABNORMAL HIGH (ref 70–99)
Potassium: 3.6 mmol/L (ref 3.5–5.1)
Sodium: 138 mmol/L (ref 135–145)
Total Bilirubin: 0.2 mg/dL — ABNORMAL LOW (ref 0.3–1.2)
Total Protein: 6.9 g/dL (ref 6.5–8.1)

## 2022-05-07 MED ORDER — SODIUM CHLORIDE 0.9% FLUSH
10.0000 mL | Freq: Once | INTRAVENOUS | Status: AC
  Administered 2022-05-07: 10 mL

## 2022-05-07 MED ORDER — DOXORUBICIN HCL CHEMO IV INJECTION 2 MG/ML
60.0000 mg/m2 | Freq: Once | INTRAVENOUS | Status: AC
  Administered 2022-05-07: 132 mg via INTRAVENOUS
  Filled 2022-05-07: qty 66

## 2022-05-07 MED ORDER — HEPARIN SOD (PORK) LOCK FLUSH 100 UNIT/ML IV SOLN
500.0000 [IU] | Freq: Once | INTRAVENOUS | Status: AC | PRN
  Administered 2022-05-07: 500 [IU]

## 2022-05-07 MED ORDER — SODIUM CHLORIDE 0.9 % IV SOLN
10.0000 mg | Freq: Once | INTRAVENOUS | Status: AC
  Administered 2022-05-07: 10 mg via INTRAVENOUS
  Filled 2022-05-07: qty 10

## 2022-05-07 MED ORDER — SODIUM CHLORIDE 0.9% FLUSH
10.0000 mL | INTRAVENOUS | Status: DC | PRN
  Administered 2022-05-07: 10 mL

## 2022-05-07 MED ORDER — SODIUM CHLORIDE 0.9 % IV SOLN: Freq: Once | INTRAVENOUS | Status: AC

## 2022-05-07 MED ORDER — SODIUM CHLORIDE 0.9 % IV SOLN
150.0000 mg | Freq: Once | INTRAVENOUS | Status: AC
  Administered 2022-05-07: 150 mg via INTRAVENOUS
  Filled 2022-05-07: qty 150

## 2022-05-07 MED ORDER — PALONOSETRON HCL INJECTION 0.25 MG/5ML
0.2500 mg | Freq: Once | INTRAVENOUS | Status: AC
  Administered 2022-05-07: 0.25 mg via INTRAVENOUS
  Filled 2022-05-07: qty 5

## 2022-05-07 NOTE — Assessment & Plan Note (Signed)
I recommend the patient to take antiemetics as needed 

## 2022-05-07 NOTE — Assessment & Plan Note (Signed)
This is mild I recommend conservative which approach with baking soda mixed with salt water gargle We will proceed with treatment without delay

## 2022-05-07 NOTE — Progress Notes (Signed)
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   All questions were answered. The patient knows to call the clinic with any problems, questions or concerns. The total time spent in the appointment was 20 minutes encounter with patients including review of chart and various tests results, discussions about plan of care and coordination of care plan   [name], MD 05/07/2022 8:42 AM  INTERVAL HISTORY: Please see below for problem oriented charting. she returns for treatment follow-up seen prior to cycle 2 of doxorubicin She had very mild nausea but no vomiting Her nausea is alleviated by antiemetics She had mild intermittent loose stool She also have sensation of mucositis but no visible sores and it does not affect her ability to eat  REVIEW OF SYSTEMS:   Constitutional: Denies fevers, chills or abnormal weight loss Eyes: Denies blurriness of vision Respiratory: Denies cough, dyspnea or wheezes Cardiovascular: Denies palpitation, chest  discomfort or lower extremity swelling Skin: Denies abnormal skin rashes Lymphatics: Denies new lymphadenopathy or easy bruising Neurological:Denies numbness, tingling or new weaknesses Behavioral/Psych: Mood is stable, no new changes  All other systems were reviewed with the patient and are negative.  I have reviewed the past medical history, past surgical history, social history and family history with the patient and they are unchanged from previous note.  ALLERGIES:  has No Known Allergies.  MEDICATIONS:  Current Outpatient Medications  Medication Sig Dispense Refill   aspirin 81 MG EC tablet Take by mouth daily.     cetirizine (ZYRTEC) 10 MG tablet Take 10 mg by mouth daily.     Cholecalciferol (VITAMIN D3 PO) Take 1 tablet by mouth daily.     fluticasone (FLONASE) 50 MCG/ACT nasal spray Place 2 sprays into both nostrils 2 (two) times daily as needed for allergies or rhinitis.     lidocaine-prilocaine (EMLA) cream Apply to affected area once 30 g 3   losartan-hydrochlorothiazide (HYZAAR) 100-25 MG tablet Take 1 tablet by mouth daily.     Multiple Vitamins-Minerals (ZINC PO) Take 50 mg by mouth daily.     omeprazole (PRILOSEC) 20 MG capsule Take 1 capsule (20 mg total) by mouth 2 (two) times daily. 28 capsule 0   ondansetron (ZOFRAN) 8 MG tablet Take 1 tablet (8 mg total) by mouth every 8 (eight) hours as needed. Start on the third day after chemotherapy. 30 tablet 1   prochlorperazine (COMPAZINE) 10 MG tablet Take 1 tablet (10 mg total) by mouth every 6 (six) hours as needed (Nausea or vomiting). 30 tablet 1   triamcinolone cream (KENALOG) 0.1 % Apply 1 application topically 2 (two) times daily as  needed (psoriasis).     No current facility-administered medications for this visit.    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 No extra uterine extension. No evidence of metastatic disease within the pelvis.   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 UTERUS, CERVIX, BILATERAL FALLOPIAN TUBES AND OVARIES, HYSTERECTOMY:  - Uterus:       Mixed high grade uterine sarcoma, spanning 6 cm, see comment.       Extensive lymphovascular invasion.       See oncology table.  - Cervix: Benign squamous and endocervical mucosa. No dysplasia or  malignancy.  - Bilateral ovaries: Unremarkable. No malignancy.  - Bilateral fallopian tubes: Unremarkable. No malignancy.   ONCOLOGY TABLE:   UTERUS, SARCOMA: Resection   Procedure: Total hysterectomy and bilateral salpingo-oophorectomy  Specimen Integrity: Focally disrupted on posterior surface  Tumor Site: Anterior wall  Tumor Size: 6 cm  Histologic Type: High grade sarcoma, mixed. See comment.  Other Tissue/ Organ Involvement: Not identified  Lymphovascular Invasion: Present, extensive.  Margins: All margins negative for tumor  Regional Lymph Nodes: Not applicable (no lymph nodes submitted or found)  Distant Metastasis:       Distant Site(s) Involved: Not applicable  Pathologic Stage Classification  (pTNM, AJCC 8th Edition): pT1b, pN not  assigned  Ancillary Studies: Can be performed upon request  Representative Tumor Block: A6  Comment(s): The tumor has two morphologically different components. One component consists of malignant spindle cells which can be seen arising from the smooth muscle. These foci are positive for SMA, desmin, cyclinD1 (focal), and CD10 (patchy). The other component consists of round to slightly spindle cells with abundant admixed vessels and occasional large pleomorphic cells. These areas are positive for SMA (patchy weak), desmin (focal), CD10 (variable with diffuse areas). Pancytokeratin is negative. 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   ADDENDUM:   PROGNOSTIC INDICATOR RESULTS:   Immunohistochemical and morphometric analysis performed manually    Estrogen Receptor:       POSITIVE, 50%, WEAK TO MODERATE STAINING  Progesterone Receptor:   POSITIVE, 50%, MODERATE TO STRONG STAINING   Reference Range Estrogen and Progesterone Receptor       Negative  0%       Positive  >1%    06/03/2021 Surgery   Surgeon: Donaciano Eva    Assistants: Dr Lahoma Crocker (an MD assistant was necessary for tissue manipulation, management of robotic instrumentation, retraction and positioning due to the complexity of the case and hospital policies).  Operation: Robotic-assisted laparoscopic total hysterectomy >250gm with bilateral salpingoophorectomy, minilaparotomy for specimen delivery   Surgeon: Donaciano Eva    Operative Findings:  :  Bulky 16cm uterus with intra-uterine mass (consistent with a fibroid-like mass) , smooth normal appearing serosa, no suspicious bulky nodes. Normal ovaries bilaterally. Normal upper abdomen. Uterus too large to deliver vaginally in tact.    06/18/2021 Imaging   1. Multiple bilateral pulmonary nodules, measuring up to 9 mm. Most of these are perifissural  and subpleural in location, likely lymph nodes. Nevertheless, close follow-up recommended to exclude metastatic disease. 2. 11 mm hypoattenuating lesion towards the dome of the liver, indeterminate. MRI abdomen with and without contrast could be used to further evaluate as clinically warranted. 3. Aortic Atherosclerosis (ICD10-I70.0).   10/13/2021 Imaging   1. No acute findings within the abdomen or pelvis. No specific findings identified to suggest residual or recurrence of tumor. 2. Stable small pulmonary nodules within the left lower lobe.   03/18/2022 Imaging   1. Interval development of 2 soft tissue nodules in the anterior pelvis measuring 10 mm and 7 mm respectively. Close follow-up recommended to exclude metastatic disease. PET-CT may be warranted to further evaluate. 2. Development of 2 mm left upper lobe parenchymal nodule, nonspecific. Close attention on follow-up imaging recommended  3. Stable appearance of index bilateral pulmonary nodules identified previously. 4. Aortic Atherosclerosis (ICD10-I70.0).   04/02/2022 PET scan   1. Signs of local tumor recurrence noted at the vaginal cuff. 2. 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 New since 10/13/2021 and progressive when compared with 03/17/2022. 3. New focal area of increased uptake within the right biceps femoris muscle which is suspicious for skeletal muscle involvement. 4. Stable appearance of small pulmonary nodules described on 03/17/2022. These are too small to characterize by PET-CT.   04/09/2022 Procedure   Successful placement of a right IJ approach Power Port with ultrasound and fluoroscopic guidance. The catheter is ready for use.   04/09/2022 Echocardiogram    1. Left ventricular ejection fraction, by estimation, is 65 to 70%. The left ventricle has normal function. The left ventricle has no regional wall motion abnormalities. There is mild concentric left ventricular  hypertrophy. Left ventricular diastolic parameters are consistent with Grade I diastolic dysfunction (impaired relaxation).  2. Right ventricular systolic function is normal. The right ventricular size is normal.  3. The mitral valve is normal in structure. No evidence of mitral valve regurgitation.  4. The aortic valve is tricuspid. Aortic valve regurgitation is not visualized. No aortic stenosis is present.  5. The inferior vena cava is normal in size with greater than 50% respiratory variability, suggesting right atrial pressure of 3 mmHg.   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       PHYSICAL EXAMINATION: ECOG PERFORMANCE STATUS: 1 - Symptomatic but completely ambulatory  Vitals:   05/07/22 0821  BP: 136/69  Pulse: (!) 107  Resp: 18  Temp: (!) 97.4 F (36.3 C)  SpO2: 97%   Filed Weights   05/07/22 0821  Weight: 226 lb 12.8 oz (102.9 kg)    GENERAL:alert, no distress and comfortable NEURO: alert & oriented x 3 with fluent speech, no focal motor/sensory deficits  LABORATORY DATA:  I have reviewed the data as listed    Component Value Date/Time   NA 136 04/16/2022 0736   K 3.6 04/16/2022 0736   CL 102 04/16/2022 0736   CO2 26 04/16/2022 0736   GLUCOSE 143 (H) 04/16/2022 0736   BUN 11 04/16/2022 0736   CREATININE 0.72 04/16/2022 0736   CALCIUM 9.8  04/16/2022 0736   PROT 7.1 04/16/2022 0736   ALBUMIN 4.3 04/16/2022 0736   AST 16 04/16/2022 0736   ALT 19 04/16/2022 0736   ALKPHOS 74 04/16/2022 0736   BILITOT 0.3 04/16/2022 0736   GFRNONAA >60 04/16/2022 0736    No results found for: "SPEP", "UPEP"  Lab Results  Component Value Date   WBC 7.7 05/07/2022   NEUTROABS 3.4 05/07/2022   HGB 12.3 05/07/2022   HCT 35.8 (L) 05/07/2022   MCV 85.2 05/07/2022   PLT 463 (H) 05/07/2022      Chemistry      Component Value Date/Time   NA 136 04/16/2022 0736   K 3.6 04/16/2022 0736   CL 102  04/16/2022 0736   CO2 26 04/16/2022 0736   BUN 11 04/16/2022 0736   CREATININE 0.72 04/16/2022 0736      Component Value Date/Time   CALCIUM 9.8 04/16/2022 0736   ALKPHOS 74 04/16/2022 0736   AST 16 04/16/2022 0736   ALT 19 04/16/2022 0736   BILITOT 0.3 04/16/2022 0736       RADIOGRAPHIC STUDIES: I have personally reviewed the radiological images as listed and agreed with the findings in the report. ECHOCARDIOGRAM COMPLETE  Result Date: 04/09/2022    ECHOCARDIOGRAM REPORT   Patient Name:   Leah Diaz Date of Exam: 04/09/2022 Medical Rec #:  [id number]       Height:       67.0 in Accession #:    [id number]      Weight:       223.0 lb Date of Birth:  1961/07/13       BSA:          2.118 m Patient Age:    30 years        BP:           145/84 mmHg Patient Gender: F               HR:           83 bpm. Exam Location:  [facility] Procedure: 2D Echo, Cardiac Doppler and Color Doppler Indications:    Chemo  History:        Patient has no prior history of Echocardiogram examinations.                 Uterine Sarcoma.  Sonographer:    Wenda Low Referring Phys: [id number] Harlow Carrizales [place]  1. Left ventricular ejection fraction, by estimation, is 65 to 70%. The left ventricle has normal function. The left ventricle has no regional wall motion abnormalities. There is mild concentric left ventricular hypertrophy. Left ventricular diastolic parameters are consistent with Grade I diastolic dysfunction (impaired relaxation).  2. Right ventricular systolic function is normal. The right ventricular size is normal.  3. The mitral valve is normal in structure. No evidence of mitral valve regurgitation.  4. The aortic valve is tricuspid. Aortic valve regurgitation is not visualized. No aortic stenosis is present.  5. The inferior vena cava is normal in size with greater than 50% respiratory variability, suggesting right atrial pressure of 3 mmHg. Comparison(s): No prior Echocardiogram. FINDINGS  Left  Ventricle: Left ventricular ejection fraction, by estimation, is 65 to 70%. The left ventricle has normal function. The left ventricle has no regional wall motion abnormalities. The left ventricular internal cavity size was normal in size. There is  mild concentric left ventricular hypertrophy. Left ventricular diastolic parameters are consistent with Grade I diastolic dysfunction (impaired relaxation). Right Ventricle: The  right ventricular size is normal. No increase in right ventricular wall thickness. Right ventricular systolic function is normal. Left Atrium: Left atrial size was normal in size. Right Atrium: Right atrial size was normal in size. Pericardium: There is no evidence of pericardial effusion. Mitral Valve: The mitral valve is normal in structure. No evidence of mitral valve regurgitation. MV peak gradient, 3.8 mmHg. The mean mitral valve gradient is 1.0 mmHg. Tricuspid Valve: The tricuspid valve is normal in structure. Tricuspid valve regurgitation is trivial. Aortic Valve: The aortic valve is tricuspid. Aortic valve regurgitation is not visualized. No aortic stenosis is present. Aortic valve mean gradient measures 4.0 mmHg. Aortic valve peak gradient measures 6.4 mmHg. Aortic valve area, by VTI measures 2.55 cm. Pulmonic Valve: The pulmonic valve was normal in structure. Pulmonic valve regurgitation is trivial. Aorta: The aortic root is normal in size and structure. Venous: The inferior vena cava is normal in size with greater than 50% respiratory variability, suggesting right atrial pressure of 3 mmHg. IAS/Shunts: The atrial septum is grossly normal.  LEFT VENTRICLE PLAX 2D LVIDd:         4.00 cm     Diastology LVIDs:         1.90 cm     LV e' medial:    7.72 cm/s LV PW:         1.10 cm     LV E/e' medial:  7.6 LV IVS:        1.10 cm     LV e' lateral:   13.80 cm/s LVOT diam:     1.90 cm     LV E/e' lateral: 4.3 LV SV:         64 LV SV Index:   30 LVOT Area:     2.84 cm  LV Volumes (MOD) LV vol  d, MOD A2C: 26.3 ml LV vol d, MOD A4C: 35.8 ml LV vol s, MOD A2C: 14.4 ml LV vol s, MOD A4C: 16.3 ml LV SV MOD A2C:     11.9 ml LV SV MOD A4C:     35.8 ml LV SV MOD BP:      16.5 ml RIGHT VENTRICLE RV Basal diam:  2.75 cm RV Mid diam:    2.10 cm RV S prime:     13.60 cm/s TAPSE (M-mode): 2.4 cm LEFT ATRIUM             Index        RIGHT ATRIUM           Index LA diam:        3.30 cm 1.56 cm/m   RA Area:     11.20 cm LA Vol (A2C):   33.4 ml 15.77 ml/m  RA Volume:   23.40 ml  11.05 ml/m LA Vol (A4C):   30.9 ml 14.59 ml/m LA Biplane Vol: 33.3 ml 15.72 ml/m  AORTIC VALVE                    PULMONIC VALVE AV Area (Vmax):    2.70 cm     PV Vmax:       0.87 m/s AV Area (Vmean):   2.33 cm     PV Peak grad:  3.0 mmHg AV Area (VTI):     2.55 cm AV Vmax:           126.00 cm/s AV Vmean:          85.500 cm/s AV VTI:  0.252 m AV Peak Grad:      6.4 mmHg AV Mean Grad:      4.0 mmHg LVOT Vmax:         120.00 cm/s LVOT Vmean:        70.200 cm/s LVOT VTI:          0.227 m LVOT/AV VTI ratio: 0.90  AORTA Ao Root diam: 2.60 cm Ao Asc diam:  2.90 cm MITRAL VALVE MV Area (PHT): 3.68 cm    SHUNTS MV Area VTI:   2.46 cm    Systemic VTI:  0.23 m MV Peak grad:  3.8 mmHg    Systemic Diam: 1.90 cm MV Mean grad:  1.0 mmHg MV Vmax:       0.97 m/s MV Vmean:      54.8 cm/s MV Decel Time: 206 msec MV E velocity: 58.70 cm/s MV A velocity: 85.60 cm/s MV E/A ratio:  0.69 Gwyndolyn Kaufman MD Electronically signed by Gwyndolyn Kaufman MD Signature Date/Time: 04/09/2022/12:49:58 PM    Final    IR IMAGING GUIDED PORT INSERTION  Result Date: 04/08/2022 CLINICAL DATA:  Patient with uterine carcinoma requires a Port-A-Cath EXAM: IR IMAGING GUIDED PORT INSERTION Date: 04/08/2022 ANESTHESIA/SEDATION: Moderate (conscious) sedation was administered during this procedure. A total of 3.5 mg Versed and 150 mg Fentanyl were administered intravenously. The patient's vital signs were monitored continuously by radiology nursing throughout the  course of the procedure. Total sedation time: 28 minutes FLUOROSCOPY: 12 seconds of fluoro time: 1 mGy TECHNIQUE: The right neck and chest was prepped with chlorhexidine, and draped in the usual sterile fashion using maximum barrier technique (cap and mask, sterile gown, sterile gloves, large sterile sheet, hand hygiene and cutaneous antiseptic). Local anesthesia was attained by infiltration with 1% lidocaine and with epinephrine. Ultrasound demonstrated patency of the right internal jugular vein, and this was documented with an image. Under real-time ultrasound guidance, this vein was accessed with a 21 gauge micropuncture needle and image documentation was performed. A small dermatotomy was made at the access site with an 11 scalpel. A 0.018" wire was advanced into the SVC and the access needle exchanged for a 89F micropuncture vascular sheath. The 0.018" wire was then removed and a 0.035" wire advanced into the IVC. An appropriate location for the subcutaneous reservoir was selected below the clavicle and an incision was made through the skin and underlying soft tissues. The subcutaneous tissues were then dissected using a combination of blunt and sharp surgical technique and a pocket was formed. A single lumen power injectable portacatheter was then tunneled through the subcutaneous tissues from the pocket to the dermatotomy and the port reservoir placed within the subcutaneous pocket. The venous access site was then serially dilated and a peel away vascular sheath placed over the wire. The wire was removed and the port catheter advanced into position under fluoroscopic guidance. The catheter tip is positioned in the upper right atrium. This was documented with a spot image. The portacatheter was then tested and found to flush and aspirate well. The port was flushed with saline followed by 100 units/mL heparinized saline. The pocket was then closed using 4-0 absorbable sutures. The epidermis was then sealed with  Dermabond. The dermatotomy at the venous access site was also closed with a single inverted subdermal suture and the epidermis sealed with Dermabond. COMPLICATIONS: None.  The patient tolerated the procedure well. IMPRESSION: Successful placement of a right IJ approach Power Port with ultrasound and fluoroscopic guidance. The catheter is ready for  use. Electronically Signed   By: Frazier Richards M.D.   On: 04/08/2022 11:36

## 2022-05-07 NOTE — Assessment & Plan Note (Signed)
So far, she tolerated treatment well without major side effects The mild mucositis and nausea are expected complications but it does not affect her We will proceed with treatment without delay Plan to order CT imaging after cycle 3 of treatment 

## 2022-05-27 MED FILL — Dexamethasone Sodium Phosphate Inj 100 MG/10ML: INTRAMUSCULAR | Qty: 1 | Status: AC

## 2022-05-27 MED FILL — Fosaprepitant Dimeglumine For IV Infusion 150 MG (Base Eq): INTRAVENOUS | Qty: 5 | Status: AC

## 2022-05-28 ENCOUNTER — Inpatient Hospital Stay: Payer: Managed Care, Other (non HMO) | Attending: Gynecologic Oncology

## 2022-05-28 ENCOUNTER — Encounter: Payer: Self-pay | Admitting: Hematology and Oncology

## 2022-05-28 ENCOUNTER — Inpatient Hospital Stay (HOSPITAL_BASED_OUTPATIENT_CLINIC_OR_DEPARTMENT_OTHER): Payer: Managed Care, Other (non HMO) | Admitting: Hematology and Oncology

## 2022-05-28 ENCOUNTER — Inpatient Hospital Stay: Payer: Managed Care, Other (non HMO)

## 2022-05-28 ENCOUNTER — Other Ambulatory Visit: Payer: Self-pay

## 2022-05-28 VITALS — BP 144/75 | HR 103 | Temp 98.5°F | Resp 18 | Ht 67.5 in | Wt 227.6 lb

## 2022-05-28 VITALS — BP 130/71 | HR 85 | Resp 16

## 2022-05-28 DIAGNOSIS — C786 Secondary malignant neoplasm of retroperitoneum and peritoneum: Secondary | ICD-10-CM | POA: Insufficient documentation

## 2022-05-28 DIAGNOSIS — Z5111 Encounter for antineoplastic chemotherapy: Secondary | ICD-10-CM | POA: Insufficient documentation

## 2022-05-28 DIAGNOSIS — R918 Other nonspecific abnormal finding of lung field: Secondary | ICD-10-CM | POA: Insufficient documentation

## 2022-05-28 DIAGNOSIS — Z90710 Acquired absence of both cervix and uterus: Secondary | ICD-10-CM | POA: Diagnosis not present

## 2022-05-28 DIAGNOSIS — R110 Nausea: Secondary | ICD-10-CM

## 2022-05-28 DIAGNOSIS — C549 Malignant neoplasm of corpus uteri, unspecified: Secondary | ICD-10-CM | POA: Diagnosis not present

## 2022-05-28 DIAGNOSIS — G629 Polyneuropathy, unspecified: Secondary | ICD-10-CM | POA: Diagnosis not present

## 2022-05-28 LAB — CBC WITH DIFFERENTIAL (CANCER CENTER ONLY)
Abs Immature Granulocytes: 0.17 10*3/uL — ABNORMAL HIGH (ref 0.00–0.07)
Basophils Absolute: 0.1 10*3/uL (ref 0.0–0.1)
Basophils Relative: 2 %
Eosinophils Absolute: 0.4 10*3/uL (ref 0.0–0.5)
Eosinophils Relative: 6 %
HCT: 35.4 % — ABNORMAL LOW (ref 36.0–46.0)
Hemoglobin: 12 g/dL (ref 12.0–15.0)
Immature Granulocytes: 3 %
Lymphocytes Relative: 28 %
Lymphs Abs: 1.6 10*3/uL (ref 0.7–4.0)
MCH: 29.1 pg (ref 26.0–34.0)
MCHC: 33.9 g/dL (ref 30.0–36.0)
MCV: 85.7 fL (ref 80.0–100.0)
Monocytes Absolute: 0.8 10*3/uL (ref 0.1–1.0)
Monocytes Relative: 14 %
Neutro Abs: 2.9 10*3/uL (ref 1.7–7.7)
Neutrophils Relative %: 47 %
Platelet Count: 411 10*3/uL — ABNORMAL HIGH (ref 150–400)
RBC: 4.13 MIL/uL (ref 3.87–5.11)
RDW: 14.9 % (ref 11.5–15.5)
WBC Count: 6 10*3/uL (ref 4.0–10.5)
nRBC: 0 % (ref 0.0–0.2)

## 2022-05-28 LAB — CMP (CANCER CENTER ONLY)
ALT: 29 U/L (ref 0–44)
AST: 24 U/L (ref 15–41)
Albumin: 4.2 g/dL (ref 3.5–5.0)
Alkaline Phosphatase: 68 U/L (ref 38–126)
Anion gap: 10 (ref 5–15)
BUN: 13 mg/dL (ref 8–23)
CO2: 27 mmol/L (ref 22–32)
Calcium: 9.9 mg/dL (ref 8.9–10.3)
Chloride: 100 mmol/L (ref 98–111)
Creatinine: 0.8 mg/dL (ref 0.44–1.00)
GFR, Estimated: >60 mL/min (ref >60)
Glucose, Bld: 168 mg/dL — ABNORMAL HIGH (ref 70–99)
Potassium: 3.7 mmol/L (ref 3.5–5.1)
Sodium: 137 mmol/L (ref 135–145)
Total Bilirubin: 0.3 mg/dL (ref 0.3–1.2)
Total Protein: 6.7 g/dL (ref 6.5–8.1)

## 2022-05-28 MED ORDER — SODIUM CHLORIDE 0.9% FLUSH
10.0000 mL | Freq: Once | INTRAVENOUS | Status: AC
  Administered 2022-05-28: 10 mL

## 2022-05-28 MED ORDER — DOXORUBICIN HCL CHEMO IV INJECTION 2 MG/ML
60.0000 mg/m2 | Freq: Once | INTRAVENOUS | Status: AC
  Administered 2022-05-28: 132 mg via INTRAVENOUS
  Filled 2022-05-28: qty 66

## 2022-05-28 MED ORDER — SODIUM CHLORIDE 0.9% FLUSH
10.0000 mL | INTRAVENOUS | Status: DC | PRN
  Administered 2022-05-28: 10 mL

## 2022-05-28 MED ORDER — PALONOSETRON HCL INJECTION 0.25 MG/5ML
0.2500 mg | Freq: Once | INTRAVENOUS | Status: AC
  Administered 2022-05-28: 0.25 mg via INTRAVENOUS
  Filled 2022-05-28: qty 5

## 2022-05-28 MED ORDER — SODIUM CHLORIDE 0.9 % IV SOLN: Freq: Once | INTRAVENOUS | Status: AC

## 2022-05-28 MED ORDER — PROCHLORPERAZINE MALEATE 10 MG PO TABS: 10.0000 mg | ORAL_TABLET | Freq: Four times a day (QID) | ORAL | 1 refills | Status: DC | PRN

## 2022-05-28 MED ORDER — ONDANSETRON HCL 8 MG PO TABS: 8.0000 mg | ORAL_TABLET | Freq: Three times a day (TID) | ORAL | 1 refills | Status: DC | PRN

## 2022-05-28 MED ORDER — SODIUM CHLORIDE 0.9 % IV SOLN
150.0000 mg | Freq: Once | INTRAVENOUS | Status: AC
  Administered 2022-05-28: 150 mg via INTRAVENOUS
  Filled 2022-05-28: qty 150

## 2022-05-28 MED ORDER — HEPARIN SOD (PORK) LOCK FLUSH 100 UNIT/ML IV SOLN
500.0000 [IU] | Freq: Once | INTRAVENOUS | Status: AC | PRN
  Administered 2022-05-28: 500 [IU]

## 2022-05-28 MED ORDER — SODIUM CHLORIDE 0.9 % IV SOLN
10.0000 mg | Freq: Once | INTRAVENOUS | Status: AC
  Administered 2022-05-28: 10 mg via INTRAVENOUS
  Filled 2022-05-28: qty 10

## 2022-05-28 NOTE — Assessment & Plan Note (Signed)
So far, she tolerated treatment well without major side effects The mild mucositis and nausea are expected complications but it does not affect her We will proceed with treatment without delay Plan to order CT imaging after cycle 3 of treatment

## 2022-05-28 NOTE — Progress Notes (Signed)
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    Answer:   [facility]    Order Specific Question:   Radiology Contrast Protocol - do NOT remove file path    Answer:   [email or web address]    All questions were answered. The patient knows to call the clinic with any problems, questions or concerns. The total time spent in the appointment was 20 minutes encounter with patients including review of chart and various tests results, discussions about plan of care and coordination of care plan   [name], MD 05/28/2022 8:53 AM  INTERVAL HISTORY: Please see below for problem oriented charting. she returns for treatment follow-up seen prior to cycle 3 of chemotherapy She had mild intermittent nausea but no vomiting Antiemetics were helpful No other side effects from treatment so far  REVIEW OF SYSTEMS:   Constitutional: Denies fevers, chills or abnormal weight loss Eyes: Denies blurriness of vision Ears, nose, mouth, throat, and face: Denies  mucositis or sore throat Respiratory: Denies cough, dyspnea or wheezes Cardiovascular: Denies palpitation, chest discomfort or lower extremity swelling Skin: Denies abnormal skin rashes Lymphatics: Denies new lymphadenopathy or easy bruising Neurological:Denies numbness, tingling or new weaknesses Behavioral/Psych: Mood is stable, no new changes  All other systems were reviewed with the patient and are negative.  I have reviewed the past medical history, past surgical history, social history and family history with the patient and they are unchanged from previous note.  ALLERGIES:  has No Known Allergies.  MEDICATIONS:  Current Outpatient Medications  Medication Sig Dispense Refill   aspirin 81 MG EC tablet Take by mouth daily.     cetirizine (ZYRTEC) 10 MG tablet Take 10 mg by mouth daily.     Cholecalciferol (VITAMIN D3 PO) Take 1 tablet by mouth daily.     fluticasone (FLONASE) 50 MCG/ACT nasal spray Place 2 sprays into both nostrils 2 (two) times daily as needed for allergies or rhinitis.     lidocaine-prilocaine (EMLA) cream Apply to affected area once 30 g 3   losartan-hydrochlorothiazide (HYZAAR) 100-25 MG tablet Take 1 tablet by mouth daily.     Multiple Vitamins-Minerals (ZINC PO) Take 50 mg by mouth daily.     omeprazole (PRILOSEC) 20 MG capsule Take 1 capsule (20 mg total) by mouth 2 (two) times daily. 28 capsule 0   ondansetron (ZOFRAN) 8 MG tablet Take 1 tablet (8 mg total) by mouth every 8 (eight) hours as needed. Start on the third day after chemotherapy. 60 tablet 1   prochlorperazine (COMPAZINE) 10 MG tablet Take 1 tablet (10 mg total) by mouth every  6 (six) hours as needed (Nausea or vomiting). 60 tablet 1   triamcinolone cream (KENALOG) 0.1 % Apply 1 application topically 2 (two) times daily as needed (psoriasis).     No current facility-administered medications for this visit.   Facility-Administered Medications Ordered in Other Visits  Medication Dose Route  Frequency Provider Last Rate Last Admin   0.9 %  sodium chloride infusion   Intravenous Once [name], Aleister Lady, MD       dexamethasone (DECADRON) 10 mg in sodium chloride 0.9 % 50 mL IVPB  10 mg Intravenous Once [name], Rozell Theiler, MD       DOXOrubicin (ADRIAMYCIN) chemo injection 132 mg  60 mg/m2 (Treatment Plan Recorded) Intravenous Once [name], Damonie Ellenwood, MD       fosaprepitant (EMEND) 150 mg in sodium chloride 0.9 % 145 mL IVPB  150 mg Intravenous Once [name], Kimbley Sprague, MD       heparin lock flush 100 unit/mL  500 Units Intracatheter Once PRN [name], Appolonia Ackert, MD       palonosetron (ALOXI) injection 0.25 mg  0.25 mg Intravenous Once [name], Sondra Blixt, MD       sodium chloride flush (NS) 0.9 % injection 10 mL  10 mL Intracatheter PRN [name], MD        SUMMARY OF ONCOLOGIC HISTORY: Oncology History Overview Note  High grade and LMS, 50% ER/PR positive dMMR normal, PD-L1 CPS 3%   Uterine sarcoma ([place])  06/02/2021 Imaging   MRI pelvis Heterogeneous 9.0 cm intrauterine mass within the intramural/submucosal anterior fundus demonstrating interval growth, internal enhancement, and restricted diffusion 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 No extra uterine extension. No evidence of metastatic disease within the pelvis.   Mild thickening of the endometrial stripe, likely related to entrapment by the adjacent uterine mass   06/03/2021 Initial Diagnosis   Uterine sarcoma ([place])   06/03/2021 Cancer Staging   Staging form: Corpus Uteri - Sarcoma, AJCC 7th Edition - Clinical stage from 06/03/2021: FIGO Stage IVB (rT1b, N0, M1) - Signed by [name], MD on 03/31/2022 Diagnostic confirmation: Positive histology Stage prefix: Recurrence Biopsy of metastatic site performed: No Lymph-vascular invasion (LVI): LVI present/identified, NOS   06/03/2021 Pathology Results   FINAL MICROSCOPIC DIAGNOSIS:   A. UTERUS, CERVIX, BILATERAL FALLOPIAN TUBES AND OVARIES, HYSTERECTOMY:  - Uterus:        Mixed high grade uterine sarcoma, spanning 6 cm, see comment.       Extensive lymphovascular invasion.       See oncology table.  - Cervix: Benign squamous and endocervical mucosa. No dysplasia or  malignancy.  - Bilateral ovaries: Unremarkable. No malignancy.  - Bilateral fallopian tubes: Unremarkable. No malignancy.   ONCOLOGY TABLE:   UTERUS, SARCOMA: Resection   Procedure: Total hysterectomy and bilateral salpingo-oophorectomy  Specimen Integrity: Focally disrupted on posterior surface  Tumor Site: Anterior wall  Tumor Size: 6 cm  Histologic Type: High grade sarcoma, mixed. See comment.  Other Tissue/ Organ Involvement: Not identified  Lymphovascular Invasion: Present, extensive.  Margins: All margins negative for tumor  Regional Lymph Nodes: Not applicable (no lymph nodes submitted or found)  Distant Metastasis:       Distant Site(s) Involved: Not applicable  Pathologic Stage Classification (pTNM, AJCC 8th Edition): pT1b, pN not  assigned  Ancillary Studies: Can be performed upon request  Representative Tumor Block: A6  Comment(s): The tumor has two morphologically different components. One component consists of malignant spindle cells which can be seen arising from the smooth muscle. These foci  are positive for SMA, desmin, cyclinD1 (focal), and CD10 (patchy). The other component consists of round to slightly spindle cells with abundant admixed vessels and occasional large pleomorphic cells. These areas are positive for SMA (patchy weak), desmin (focal), CD10 (variable with diffuse areas). Pancytokeratin is negative. 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   ADDENDUM:   PROGNOSTIC INDICATOR RESULTS:   Immunohistochemical and morphometric analysis performed manually    Estrogen Receptor:       POSITIVE, 50%, WEAK TO MODERATE STAINING  Progesterone Receptor:   POSITIVE, 50%, MODERATE TO STRONG STAINING    Reference Range Estrogen and Progesterone Receptor       Negative  0%       Positive  >1%    06/03/2021 Surgery   Surgeon: Donaciano Eva    Assistants: Dr Lahoma Crocker (an MD assistant was necessary for tissue manipulation, management of robotic instrumentation, retraction and positioning due to the complexity of the case and hospital policies).  Operation: Robotic-assisted laparoscopic total hysterectomy >250gm with bilateral salpingoophorectomy, minilaparotomy for specimen delivery   Surgeon: Donaciano Eva    Operative Findings:  : Bulky 16cm uterus with intra-uterine mass (consistent with a fibroid-like mass) , smooth normal appearing serosa, no suspicious bulky nodes. Normal ovaries bilaterally. Normal upper abdomen. Uterus too large to deliver vaginally in tact.    06/18/2021 Imaging   1. Multiple bilateral pulmonary nodules, measuring up to 9 mm. Most of these are perifissural and subpleural in location, likely lymph nodes. Nevertheless, close follow-up recommended to exclude metastatic disease. 2. 11 mm hypoattenuating lesion towards the dome of the liver, indeterminate. MRI abdomen with and without contrast could be used to further evaluate as clinically warranted. 3. Aortic Atherosclerosis (ICD10-I70.0).   10/13/2021 Imaging   1. No acute findings within the abdomen or pelvis. No specific findings identified to suggest residual or recurrence of tumor. 2. Stable small pulmonary nodules within the left lower lobe.   03/18/2022 Imaging   1. Interval development of 2 soft tissue nodules in the anterior pelvis measuring 10 mm and 7 mm respectively. Close follow-up recommended to exclude metastatic disease. PET-CT may be warranted to further evaluate. 2. Development of 2 mm left upper lobe parenchymal nodule, nonspecific. Close attention on follow-up imaging recommended  3. Stable appearance of index bilateral pulmonary nodules identified previously. 4. Aortic  Atherosclerosis (ICD10-I70.0).   04/02/2022 PET scan   1. Signs of local tumor recurrence noted at the vaginal cuff. 2. 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 New since 10/13/2021 and progressive when compared with 03/17/2022. 3. New focal area of increased uptake within the right biceps femoris muscle which is suspicious for skeletal muscle involvement. 4. Stable appearance of small pulmonary nodules described on 03/17/2022. These are too small to characterize by PET-CT.   04/09/2022 Procedure   Successful placement of a right IJ approach Power Port with ultrasound and fluoroscopic guidance. The catheter is ready for use.   04/09/2022 Echocardiogram    1. Left ventricular ejection fraction, by estimation, is 65 to 70%. The left ventricle has normal function. The left ventricle has no regional wall motion abnormalities. There is mild concentric left ventricular hypertrophy. Left ventricular diastolic parameters are consistent with Grade I diastolic dysfunction (impaired relaxation).  2. Right ventricular systolic function is normal. The right ventricular size is normal.  3. The mitral valve is normal in structure. No evidence of mitral valve regurgitation.  4. The  aortic valve is tricuspid. Aortic valve regurgitation is not visualized. No aortic stenosis is present.  5. The inferior vena cava is normal in size with greater than 50% respiratory variability, suggesting right atrial pressure of 3 mmHg.   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       PHYSICAL EXAMINATION: ECOG PERFORMANCE STATUS: 1 - Symptomatic but completely ambulatory  Vitals:   05/28/22 0801  BP: (!) 144/75  Pulse: (!) 103  Resp: 18  Temp: 98.5 F (36.9 C)  SpO2: 98%   Filed Weights   05/28/22 0801  Weight: 227 lb 9.6 oz (103.2 kg)    GENERAL:alert, no distress and comfortable NEURO: alert & oriented  x 3 with fluent speech, no focal motor/sensory deficits  LABORATORY DATA:  I have reviewed the data as listed    Component Value Date/Time   NA 137 05/28/2022 0747   K 3.7 05/28/2022 0747   CL 100 05/28/2022 0747   CO2 27 05/28/2022 0747   GLUCOSE 168 (H) 05/28/2022 0747   BUN 13 05/28/2022 0747   CREATININE 0.80 05/28/2022 0747   CALCIUM 9.9 05/28/2022 0747   PROT 6.7 05/28/2022 0747   ALBUMIN 4.2 05/28/2022 0747   AST 24 05/28/2022 0747   ALT 29 05/28/2022 0747   ALKPHOS 68 05/28/2022 0747   BILITOT 0.3 05/28/2022 0747   GFRNONAA >60 05/28/2022 0747    No results found for: "SPEP", "UPEP"  Lab Results  Component Value Date   WBC 6.0 05/28/2022   NEUTROABS 2.9 05/28/2022   HGB 12.0 05/28/2022   HCT 35.4 (L) 05/28/2022   MCV 85.7 05/28/2022   PLT 411 (H) 05/28/2022      Chemistry      Component Value Date/Time   NA 137 05/28/2022 0747   K 3.7 05/28/2022 0747   CL 100 05/28/2022 0747   CO2 27 05/28/2022 0747   BUN 13 05/28/2022 0747   CREATININE 0.80 05/28/2022 0747      Component Value Date/Time   CALCIUM 9.9 05/28/2022 0747   ALKPHOS 68 05/28/2022 0747   AST 24 05/28/2022 0747   ALT 29 05/28/2022 0747   BILITOT 0.3 05/28/2022 0747

## 2022-05-28 NOTE — Patient Instructions (Signed)
[facility] MEDICAL ONCOLOGY  Discharge Instructions: °Thank you for choosing [facility] to provide your oncology and hematology care.  ° °If you have a lab appointment with the Cancer Center, please go directly to the Cancer Center and check in at the registration area. °  °Wear comfortable clothing and clothing appropriate for easy access to any Portacath or PICC line.  ° °We strive to give you quality time with your provider. You may need to reschedule your appointment if you arrive late (15 or more minutes).  Arriving late affects you and other patients whose appointments are after yours.  Also, if you miss three or more appointments without notifying the office, you may be dismissed from the clinic at the provider’s discretion.    °  °For prescription refill requests, have your pharmacy contact our office and allow 72 hours for refills to be completed.   ° °Today you received the following chemotherapy and/or immunotherapy agents: Adriamycin    °  °To help prevent nausea and vomiting after your treatment, we encourage you to take your nausea medication as directed. ° °BELOW ARE SYMPTOMS THAT SHOULD BE REPORTED IMMEDIATELY: °*FEVER GREATER THAN 100.4 F (38 °C) OR HIGHER °*CHILLS OR SWEATING °*NAUSEA AND VOMITING THAT IS NOT CONTROLLED WITH YOUR NAUSEA MEDICATION °*UNUSUAL SHORTNESS OF BREATH °*UNUSUAL BRUISING OR BLEEDING °*URINARY PROBLEMS (pain or burning when urinating, or frequent urination) °*BOWEL PROBLEMS (unusual diarrhea, constipation, pain near the anus) °TENDERNESS IN MOUTH AND THROAT WITH OR WITHOUT PRESENCE OF ULCERS (sore throat, sores in mouth, or a toothache) °UNUSUAL RASH, SWELLING OR PAIN  °UNUSUAL VAGINAL DISCHARGE OR ITCHING  ° °Items with * indicate a potential emergency and should be followed up as soon as possible or go to the Emergency Department if any problems should occur. ° °Please show the CHEMOTHERAPY ALERT CARD or IMMUNOTHERAPY ALERT CARD at check-in to  the Emergency Department and triage nurse. ° °Should you have questions after your visit or need to cancel or reschedule your appointment, please contact [facility]  Dept: [phone]  and follow the prompts.  Office hours are 8:00 a.m. to 4:30 p.m. Monday - Friday. Please note that voicemails left after 4:00 p.m. may not be returned until the following business day.  We are closed weekends and major holidays. You have access to a nurse at all times for urgent questions. Please call the main number to the clinic Dept: [phone] and follow the prompts. ° ° °For any non-urgent questions, you may also contact your provider using MyChart. We now offer e-Visits for anyone 18 and older to request care online for non-urgent symptoms. For details visit [email or web address]. °  °Also download the MyChart app! Go to the app store, search "MyChart", open the app, select [facility], and log in with your MyChart username and password. ° °Due to Covid, a mask is required upon entering the hospital/clinic. If you do not have a mask, one will be given to you upon arrival. For doctor visits, patients may have 1 support person aged 18 or older with them. For treatment visits, patients cannot have anyone with them due to current Covid guidelines and our immunocompromised population.  ° °

## 2022-05-28 NOTE — Assessment & Plan Note (Signed)
I recommend the patient to take antiemetics as needed

## 2022-06-08 ENCOUNTER — Other Ambulatory Visit: Payer: Self-pay

## 2022-06-10 ENCOUNTER — Ambulatory Visit (HOSPITAL_COMMUNITY)
Admission: RE | Admit: 2022-06-10 | Discharge: 2022-06-10 | Disposition: A | Discharge status: Home / Self Care | Payer: Managed Care, Other (non HMO) | Source: Ambulatory Visit | Attending: Hematology and Oncology | Admitting: Hematology and Oncology

## 2022-06-10 DIAGNOSIS — C549 Malignant neoplasm of corpus uteri, unspecified: Secondary | ICD-10-CM | POA: Diagnosis not present

## 2022-06-10 MED ORDER — SODIUM CHLORIDE (PF) 0.9 % IJ SOLN
INTRAMUSCULAR | Status: AC
  Filled 2022-06-10: qty 50

## 2022-06-10 MED ORDER — IOHEXOL 300 MG/ML  SOLN
100.0000 mL | Freq: Once | INTRAMUSCULAR | Status: AC | PRN
  Administered 2022-06-10: 100 mL via INTRAVENOUS

## 2022-06-11 ENCOUNTER — Telehealth: Payer: Self-pay

## 2022-06-11 NOTE — Telephone Encounter (Signed)
Called her per Dr. [name], her CT scan is worse. Dr. [name] needs her to bring a family member to appt on Monday to 0820 appt to discuss options. Lab/flush and infusion appt canceled. She verbalized understanding and will speak with her husband to get him to come to appt.

## 2022-06-12 MED FILL — Fosaprepitant Dimeglumine For IV Infusion 150 MG (Base Eq): INTRAVENOUS | Qty: 5 | Status: AC

## 2022-06-12 MED FILL — Dexamethasone Sodium Phosphate Inj 100 MG/10ML: INTRAMUSCULAR | Qty: 1 | Status: AC

## 2022-06-15 ENCOUNTER — Inpatient Hospital Stay (HOSPITAL_BASED_OUTPATIENT_CLINIC_OR_DEPARTMENT_OTHER): Payer: Managed Care, Other (non HMO) | Admitting: Hematology and Oncology

## 2022-06-15 ENCOUNTER — Other Ambulatory Visit: Payer: Self-pay

## 2022-06-15 ENCOUNTER — Inpatient Hospital Stay: Payer: Managed Care, Other (non HMO)

## 2022-06-15 ENCOUNTER — Encounter: Payer: Self-pay | Admitting: Hematology and Oncology

## 2022-06-15 VITALS — BP 143/72 | HR 108 | Temp 98.0°F | Resp 18 | Ht 67.5 in | Wt 222.0 lb

## 2022-06-15 DIAGNOSIS — Z7189 Other specified counseling: Secondary | ICD-10-CM

## 2022-06-15 DIAGNOSIS — Z5111 Encounter for antineoplastic chemotherapy: Secondary | ICD-10-CM | POA: Diagnosis not present

## 2022-06-15 DIAGNOSIS — C549 Malignant neoplasm of corpus uteri, unspecified: Secondary | ICD-10-CM | POA: Diagnosis not present

## 2022-06-15 MED ORDER — DEXAMETHASONE 4 MG PO TABS: ORAL_TABLET | ORAL | 1 refills | Status: DC

## 2022-06-15 NOTE — Assessment & Plan Note (Signed)
We discussed unknown curative nature of her disease I recommend the patient to consider early retirement

## 2022-06-15 NOTE — Progress Notes (Signed)
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 2007 Jul 1;25(19):2755-63.  Randomized phase II study of gemcitabine and docetaxel compared with gemcitabine alone in patients with metastatic soft tissue sarcomas: results of sarcoma alliance for research through collaboration study 002 [corrected]. Wilson Singer, [place] JK, [employer], Priebat DA, [place], [place], Fanucchi M, [place], Schuetze SM, Reinke D, Thall PF, [place], [place], [place].  Author information Erratum in J Clin Oncol. 2007 Aug 20;25(24):3790.  Abstract PURPOSE:  Gemcitabine as a single agent and the combination of gemcitabine and docetaxel have activity in patients with metastatic soft tissue sarcoma. To determine if the addition of docetaxel to gemcitabine improved clinical outcome of patients with metastatic soft tissue sarcomas, we compared a fixed dose rate infusion of gemcitabine versus a lower dose of gemcitabine with docetaxel. PATIENTS AND METHODS:  In this open-label phase II clinical trial, the primary end point was tumor response, defined as complete or partial response or stable disease lasting at least 24 weeks. A Bayesian adaptive randomization procedure was used to produce an imbalance in the randomization in favor of the superior treatment, accounting for treatment-subgroup interactions. RESULTS:  One hundred nineteen of 122 randomly assigned  patients had assessable outcomes. The adaptive randomization assigned 73 patients (60%) to gemcitabine-docetaxel and 49 patients (40%) to gemcitabine alone, indicating gemcitabine-docetaxel was superior. The objective Response Evaluation Criteria in Solid Tumors response rates were 16% (gemcitabine-docetaxel) and 8% (gemcitabine). Given the data, the posterior probabilities that gemcitabine-docetaxel was superior for progression-free and overall survival were 0.98 and 0.97, respectively. Median progression-free survival was 6.2 months for gemcitabine-docetaxel and 3.0 months for gemcitabine alone; median overall survival was 17.9 months for gemcitabine-docetaxel and 11.5 months for gemcitabine. The posterior probability that patients receiving gemcitabine-docetaxel had a shorter time to discontinuation for toxicity compared with gemcitabine alone was .999. CONCLUSION:  Gemcitabine-docetaxel yielded superior progression-free and overall survival to gemcitabine alone, but with increased toxicity. Adaptive randomization is an effective method to reduce the number of patients receiving inferior therapy.  We discussed the role of chemotherapy. The intent is for palliative.  We discussed some of the risks, benefits, side-effects of Gemcitabine and Docetaxel.   Some of the short term side-effects included, though not limited to, risk of severe allergic reaction, fatigue, weight loss, pancytopenia, life-threatening infections, need for transfusions of blood products, nausea, vomiting, change in bowel habits, loss of hair, admission to hospital for various reasons, and risks of death.   Long term side-effects are also discussed including risks of infertility, permanent damage to nerve function, chronic fatigue, and rare secondary malignancy including bone marrow disorders.   The patient is aware that the response rates discussed earlier is not guaranteed.    After a long discussion, patient made an informed  decision to proceed with the prescribed plan of care.   Patient education material was dispensed Due to pre-existing peripheral neuropathy, I plan to reduce upfront docetaxel to 75 mg per metered square  Goals of care, counseling/discussion We discussed unknown curative nature of her disease I recommend the patient to consider early retirement  Orders Placed This Encounter  Procedures   CBC with Differential ([place] Only)    Standing Status:   Standing    Number of Occurrences:   20    Standing Expiration Date:   06/16/2023   CMP ([place] only)    Standing Status:   Standing    Number of Occurrences:   20    Standing Expiration Date:   06/16/2023    All questions were answered. The patient knows to call the clinic with any problems, questions or concerns. The total time spent in the appointment was 40 minutes encounter with patients including review of chart and various tests results, discussions about plan of care and coordination of care plan   [name], MD 06/15/2022 9:44 AM  INTERVAL HISTORY: Please see below for problem oriented charting. she returns for treatment follow-up and review of CT imaging results She is here accompanied by her husband She have no symptoms from her metastatic disease  REVIEW OF SYSTEMS:   Constitutional: Denies fevers, chills or abnormal weight loss Eyes: Denies blurriness of vision Ears, nose, mouth, throat, and face: Denies mucositis or sore throat Respiratory: Denies cough, dyspnea or wheezes Cardiovascular: Denies palpitation, chest discomfort or lower extremity swelling Gastrointestinal:  Denies nausea, heartburn or change in bowel habits Skin: Denies abnormal skin rashes Lymphatics: Denies new lymphadenopathy or easy bruising Neurological:Denies numbness, tingling or new weaknesses Behavioral/Psych: Mood is stable, no new changes  All other systems were reviewed with the patient and are negative.  I have reviewed the past  medical history, past surgical history, social history and family history with the patient and they are unchanged from previous note.  ALLERGIES:  has No Known Allergies.  MEDICATIONS:  Current Outpatient Medications  Medication Sig Dispense Refill   aspirin 81 MG EC tablet Take by mouth daily.     cetirizine (ZYRTEC) 10 MG tablet Take 10 mg by mouth daily.     Cholecalciferol (VITAMIN D3 PO) Take 1 tablet by mouth daily.     dexamethasone (DECADRON) 4 MG tablet Take 2 tabs the day before Taxotere. Then daily after chemo for 2 days. 30 tablet 1   fluticasone (FLONASE) 50 MCG/ACT nasal spray Place 2 sprays into both nostrils 2 (two) times daily as needed for allergies or rhinitis.     losartan-hydrochlorothiazide (HYZAAR) 100-25 MG tablet Take 1 tablet by mouth daily.     Multiple Vitamins-Minerals (ZINC PO) Take 50 mg by mouth daily.     omeprazole (PRILOSEC) 20 MG capsule Take 1 capsule (20 mg total) by mouth 2 (two) times daily. 28 capsule 0   triamcinolone cream (KENALOG) 0.1 % Apply 1 application topically 2 (two) times daily as needed (psoriasis).     No current facility-administered medications for this visit.    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 No extra uterine extension. No evidence of metastatic disease within the pelvis.   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 UTERUS, CERVIX, BILATERAL FALLOPIAN TUBES AND OVARIES, HYSTERECTOMY:  - Uterus:       Mixed high grade uterine sarcoma, spanning 6 cm, see comment.       Extensive lymphovascular invasion.       See oncology table.  - Cervix: Benign squamous and endocervical mucosa. No dysplasia or  malignancy.  - Bilateral ovaries: Unremarkable. No malignancy.  - Bilateral fallopian tubes: Unremarkable. No malignancy.   ONCOLOGY TABLE:   UTERUS, SARCOMA: Resection   Procedure: Total hysterectomy and bilateral salpingo-oophorectomy  Specimen Integrity: Focally disrupted on posterior surface  Tumor Site: Anterior wall  Tumor Size: 6 cm  Histologic Type: High grade sarcoma, mixed. See comment.  Other Tissue/ Organ Involvement: Not identified  Lymphovascular Invasion: Present, extensive.  Margins: All margins negative for tumor  Regional Lymph Nodes: Not applicable (no lymph nodes submitted or found)  Distant Metastasis:       Distant Site(s) Involved: Not applicable  Pathologic Stage Classification (pTNM, AJCC 8th Edition): pT1b, pN not  assigned  Ancillary Studies: Can be performed upon request  Representative Tumor Block: A6  Comment(s): The tumor has two morphologically different components. One component consists of malignant spindle cells which can be seen arising from the smooth muscle. These foci are positive for SMA, desmin, cyclinD1 (focal), and CD10 (patchy). The other component consists of round to slightly spindle cells with abundant admixed vessels and occasional large pleomorphic cells. These areas are positive for SMA (patchy weak), desmin (focal), CD10 (variable with  diffuse areas). Pancytokeratin is negative. 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   ADDENDUM:   PROGNOSTIC INDICATOR RESULTS:   Immunohistochemical and morphometric analysis performed manually    Estrogen Receptor:       POSITIVE, 50%, WEAK TO MODERATE STAINING  Progesterone Receptor:   POSITIVE, 50%, MODERATE TO STRONG STAINING   Reference Range Estrogen and Progesterone Receptor       Negative  0%       Positive  >1%    06/03/2021 Surgery   Surgeon: Donaciano Eva    Assistants: Dr Lahoma Crocker (an MD assistant was necessary for tissue manipulation, management of robotic instrumentation, retraction and positioning due to the complexity of the case and hospital policies).  Operation: Robotic-assisted laparoscopic total hysterectomy >250gm with bilateral salpingoophorectomy, minilaparotomy for specimen delivery   Surgeon: Donaciano Eva    Operative Findings:  : Bulky 16cm uterus with intra-uterine mass (consistent with a fibroid-like mass) , smooth normal appearing serosa, no suspicious bulky nodes. Normal ovaries bilaterally. Normal upper abdomen. Uterus too large to deliver vaginally in tact.    06/18/2021 Imaging   1. Multiple bilateral pulmonary nodules, measuring up to 9 mm. Most of these are perifissural and subpleural in location, likely lymph nodes. Nevertheless, close follow-up recommended to exclude metastatic disease. 2. 11 mm hypoattenuating lesion towards the dome of the liver, indeterminate. MRI abdomen with and without contrast could be used to further evaluate as clinically warranted. 3. Aortic Atherosclerosis (ICD10-I70.0).   10/13/2021 Imaging   1. No acute findings within the abdomen  or pelvis. No specific findings identified to suggest residual or recurrence of tumor. 2. Stable small pulmonary nodules within the left lower lobe.   03/18/2022 Imaging   1. Interval  development of 2 soft tissue nodules in the anterior pelvis measuring 10 mm and 7 mm respectively. Close follow-up recommended to exclude metastatic disease. PET-CT may be warranted to further evaluate. 2. Development of 2 mm left upper lobe parenchymal nodule, nonspecific. Close attention on follow-up imaging recommended  3. Stable appearance of index bilateral pulmonary nodules identified previously. 4. Aortic Atherosclerosis (ICD10-I70.0).   04/02/2022 PET scan   1. Signs of local tumor recurrence noted at the vaginal cuff. 2. 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 New since 10/13/2021 and progressive when compared with 03/17/2022. 3. New focal area of increased uptake within the right biceps femoris muscle which is suspicious for skeletal muscle involvement. 4. Stable appearance of small pulmonary nodules described on 03/17/2022. These are too small to characterize by PET-CT.   04/09/2022 Procedure   Successful placement of a right IJ approach Power Port with ultrasound and fluoroscopic guidance. The catheter is ready for use.   04/09/2022 Echocardiogram    1. Left ventricular ejection fraction, by estimation, is 65 to 70%. The left ventricle has normal function. The left ventricle has no regional wall motion abnormalities. There is mild concentric left ventricular hypertrophy. Left ventricular diastolic parameters are consistent with Grade I diastolic dysfunction (impaired relaxation).  2. Right ventricular systolic function is normal. The right ventricular size is normal.  3. The mitral valve is normal in structure. No evidence of mitral valve regurgitation.  4. The aortic valve is tricuspid. Aortic valve regurgitation is not visualized. No aortic stenosis is present.  5. The inferior vena cava is normal in size with greater than 50% respiratory variability, suggesting right atrial pressure of 3 mmHg.   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 Status post hysterectomy and bilateral oophorectomy. Multiple newand enlarged peritoneal nodules throughout the low abdomen and pelvis, previously FDG avid and consistent with worsened locally recurrent and peritoneal metastatic disease. 2. Multiple small bilateral pulmonary nodules, several of which are slightly enlarged compared to prior examination, consistent with worsened pulmonary metastatic disease.   Aortic Atherosclerosis (ICD10-I70.0).     06/25/2022 -  Chemotherapy   Patient is on Treatment Plan : UTERINE UNDIFFERENTIATED / LEIOMYOSARCOMA Gemcitabine D1,8 + Docetaxel D8 (900/100) q21d       PHYSICAL EXAMINATION: ECOG PERFORMANCE STATUS: 0 - Asymptomatic  Vitals:   06/15/22 0759  BP: (!) 143/72  Pulse: (!) 108  Resp: 18  Temp: 98 F (36.7 C)  SpO2: 100%   Filed Weights   06/15/22 0759  Weight: 222 lb (100.7 kg)    GENERAL:alert, no distress and comfortable NEURO: alert & oriented x 3 with fluent speech, no focal motor/sensory deficits  LABORATORY DATA:  I have reviewed the data as listed    Component Value Date/Time   NA 137 05/28/2022 0747   K 3.7 05/28/2022 0747   CL 100 05/28/2022 0747   CO2 27 05/28/2022 0747   GLUCOSE 168 (H) 05/28/2022 0747   BUN 13 05/28/2022 0747   CREATININE 0.80 05/28/2022 0747   CALCIUM 9.9 05/28/2022 0747   PROT 6.7 05/28/2022 0747   ALBUMIN 4.2 05/28/2022 0747   AST 24 05/28/2022 0747   ALT 29 05/28/2022 0747  ALKPHOS 68 05/28/2022 0747   BILITOT 0.3 05/28/2022 0747   GFRNONAA >60 05/28/2022 0747    No results found for: "SPEP", "UPEP"  Lab Results  Component Value Date   WBC 6.0 05/28/2022   NEUTROABS 2.9 05/28/2022   HGB 12.0 05/28/2022   HCT 35.4 (L) 05/28/2022   MCV 85.7 05/28/2022   PLT 411 (H) 05/28/2022      Chemistry      Component Value Date/Time   NA 137 05/28/2022 0747   K 3.7 05/28/2022 0747    CL 100 05/28/2022 0747   CO2 27 05/28/2022 0747   BUN 13 05/28/2022 0747   CREATININE 0.80 05/28/2022 0747      Component Value Date/Time   CALCIUM 9.9 05/28/2022 0747   ALKPHOS 68 05/28/2022 0747   AST 24 05/28/2022 0747   ALT 29 05/28/2022 0747   BILITOT 0.3 05/28/2022 0747       RADIOGRAPHIC STUDIES: I have reviewed multiple imaging studies with the patient and her husband I have personally reviewed the radiological images as listed and agreed with the findings in the report. CT CHEST ABDOMEN PELVIS W CONTRAST  Result Date: 06/10/2022 CLINICAL DATA:  Uterine sarcoma, local recurrence and peritoneal carcinomatosis, pulmonary nodules suspicious for pulmonary metastases, assess treatment response, chemotherapy in progress, status post hysterectomy * Tracking Code: BO * EXAM: CT CHEST, ABDOMEN, AND PELVIS WITH CONTRAST TECHNIQUE: Multidetector CT imaging of the chest, abdomen and pelvis was performed following the standard protocol during bolus administration of intravenous contrast. RADIATION DOSE REDUCTION: This exam was performed according to the departmental dose-optimization program which includes automated exposure control, adjustment of the mA and/or kV according to patient size and/or use of iterative reconstruction technique. CONTRAST:  176m OMNIPAQUE IOHEXOL 300 MG/ML SOLN additional oral enteric contrast COMPARISON:  PET-CT, 04/02/2022, CT chest abdomen pelvis, 03/17/2022 FINDINGS: CT CHEST FINDINGS Cardiovascular: Right chest port catheter. Normal heart size. No pericardial effusion. Mediastinum/Nodes: No enlarged mediastinal, hilar, or axillary lymph nodes. Thyroid gland, trachea, and esophagus demonstrate no significant findings. Lungs/Pleura: Multiple small bilateral pulmonary nodules, several of which are slightly enlarged compared to prior examination, for example a 0.3 cm nodule of the lateral segment right middle lobe, previously no greater than 0.1 cm (series 4, image 74), and  a 0.4 cm nodule of the posterior lingula, previously no greater than 0.2 cm (series 4, image 69). Largest nodule at the dependent right lung base measures 1.0 x 0.6 cm, previously 0.9 x 0.5 cm (series 4, image 74). No pleural effusion or pneumothorax. Musculoskeletal: No chest wall mass or suspicious osseous lesions identified. CT ABDOMEN PELVIS FINDINGS Hepatobiliary: No solid liver abnormality is seen. Small fluid attenuation cyst or hemangioma the central liver dome (series 2, image 46). No gallstones, gallbladder wall thickening, or biliary dilatation. Pancreas: Unremarkable. No pancreatic ductal dilatation or surrounding inflammatory changes. Spleen: Normal in size without significant abnormality. Adrenals/Urinary Tract: Adrenal glands are unremarkable. Kidneys are normal, without renal calculi, solid lesion, or hydronephrosis. Bladder is unremarkable. Stomach/Bowel: Stomach is within normal limits. Appendix appears normal. No evidence of bowel wall thickening, distention, or inflammatory changes. Vascular/Lymphatic: Scattered aortic atherosclerosis. No enlarged abdominal or pelvic lymph nodes. Reproductive: Status post hysterectomy and bilateral oophorectomy. Other: No abdominal wall hernia or abnormality. No ascites. Multiple new and enlarged peritoneal nodules throughout the low abdomen and pelvis, nodule of the low midline ventral abdomen measuring 4.4 x 3.4 cm, previously 2.1 x 1.6 cm (series 2, image 105), dominant nodule of the vaginal cuff measuring 5.9  x 2.9 cm, previously 4.6 x 1.5 cm (series 2, image 114), and small nodule of the left lower quadrant measuring 1.1 x 0.8 cm, previously 0.4 cm (series 2, image 100). Musculoskeletal: No acute osseous findings. IMPRESSION: 1. Status post hysterectomy and bilateral oophorectomy. Multiple new and enlarged peritoneal nodules throughout the low abdomen and pelvis, previously FDG avid and consistent with worsened locally recurrent and peritoneal metastatic  disease. 2. Multiple small bilateral pulmonary nodules, several of which are slightly enlarged compared to prior examination, consistent with worsened pulmonary metastatic disease. Aortic Atherosclerosis (ICD10-I70.0). Electronically Signed   By: Delanna Ahmadi M.D.   On: 06/10/2022 12:41

## 2022-06-15 NOTE — Assessment & Plan Note (Signed)
I have reviewed CT imaging with the patient and her husband Unfortunately, she has progressed on doxorubicin We discussed next step in accordance to current guidelines  We reviewed the guidelines and discussed treatment options The role of treatment is of palliative intent The treatment is based on publication below:  J Clin Oncol. 2007 Jul 1;25(19):2755-63.  Randomized phase II study of gemcitabine and docetaxel compared with gemcitabine alone in patients with metastatic soft tissue sarcomas: results of sarcoma alliance for research through collaboration study 002 [corrected]. [place] Singer, [place] JK, [employer], Priebat DA, [place], [place], Fanucchi M, [place], Schuetze SM, Reinke D, Thall PF, [place], [place], [place].  Author information Erratum in J Clin Oncol. 2007 Aug 20;25(24):3790.  Abstract PURPOSE:  Gemcitabine as a single agent and the combination of gemcitabine and docetaxel have activity in patients with metastatic soft tissue sarcoma. To determine if the addition of docetaxel to gemcitabine improved clinical outcome of patients with metastatic soft tissue sarcomas, we compared a fixed dose rate infusion of gemcitabine versus a lower dose of gemcitabine with docetaxel. PATIENTS AND METHODS:  In this open-label phase II clinical trial, the primary end point was tumor response, defined as complete or partial response or stable disease lasting at least 24 weeks. A Bayesian adaptive randomization procedure was used to produce an imbalance in the randomization in favor of the superior treatment, accounting for treatment-subgroup interactions. RESULTS:  One hundred nineteen of 122 randomly assigned patients had assessable outcomes. The adaptive randomization assigned 73 patients (60%) to gemcitabine-docetaxel and 49 patients (40%) to gemcitabine alone, indicating gemcitabine-docetaxel was superior. The objective Response Evaluation Criteria in Solid Tumors response rates were 16%  (gemcitabine-docetaxel) and 8% (gemcitabine). Given the data, the posterior probabilities that gemcitabine-docetaxel was superior for progression-free and overall survival were 0.98 and 0.97, respectively. Median progression-free survival was 6.2 months for gemcitabine-docetaxel and 3.0 months for gemcitabine alone; median overall survival was 17.9 months for gemcitabine-docetaxel and 11.5 months for gemcitabine. The posterior probability that patients receiving gemcitabine-docetaxel had a shorter time to discontinuation for toxicity compared with gemcitabine alone was .999. CONCLUSION:  Gemcitabine-docetaxel yielded superior progression-free and overall survival to gemcitabine alone, but with increased toxicity. Adaptive randomization is an effective method to reduce the number of patients receiving inferior therapy.  We discussed the role of chemotherapy. The intent is for palliative.  We discussed some of the risks, benefits, side-effects of Gemcitabine and Docetaxel.   Some of the short term side-effects included, though not limited to, risk of severe allergic reaction, fatigue, weight loss, pancytopenia, life-threatening infections, need for transfusions of blood products, nausea, vomiting, change in bowel habits, loss of hair, admission to hospital for various reasons, and risks of death.   Long term side-effects are also discussed including risks of infertility, permanent damage to nerve function, chronic fatigue, and rare secondary malignancy including bone marrow disorders.   The patient is aware that the response rates discussed earlier is not guaranteed.    After a long discussion, patient made an informed decision to proceed with the prescribed plan of care.   Patient education material was dispensed Due to pre-existing peripheral neuropathy, I plan to reduce upfront docetaxel to 75 mg per metered square

## 2022-06-15 NOTE — Progress Notes (Signed)
DISCONTINUE OFF PATHWAY REGIMEN - Uterine   [id number]:Doxorubicin 75 mg/m2 IV D1 q21 Days:   A cycle is every 21 days:     Doxorubicin   **Always confirm dose/schedule in your pharmacy ordering system**  REASON: Disease Progression PRIOR TREATMENT: Off Pathway: Doxorubicin 75 mg/m2 IV D1 q21 Days TREATMENT RESPONSE: Progressive Disease (PD)  START ON PATHWAY REGIMEN - Uterine     A cycle is every 21 days:     Gemcitabine      Docetaxel      [id number]-[id number]   **Always confirm dose/schedule in your pharmacy ordering system**  Patient Characteristics: High Grade Undifferentiated/Leiomyosarcoma, Recurrent/Progressive Disease, Medically Inoperable, Second Line, Relapse < 12 Months From Prior Therapy Histology: High Grade Undifferentiated/Leiomyosarcoma Therapeutic Status: Recurrent or Progressive Disease Surgical Status: Medically Inoperable Line of Therapy: Second Line Time to Recurrence: Relapse < 12 Months From Prior Therapy Intent of Therapy: Non-Curative / Palliative Intent, Discussed with Patient

## 2022-06-16 ENCOUNTER — Other Ambulatory Visit: Payer: Managed Care, Other (non HMO)

## 2022-06-16 ENCOUNTER — Other Ambulatory Visit: Payer: Self-pay

## 2022-06-16 ENCOUNTER — Ambulatory Visit: Payer: Managed Care, Other (non HMO) | Admitting: Hematology and Oncology

## 2022-06-18 NOTE — Progress Notes (Signed)
Pharmacist Chemotherapy Monitoring - Initial Assessment    Anticipated start date: 06/25/22   The following has been reviewed per standard work regarding the patient's treatment regimen: The patient's diagnosis, treatment plan and drug doses, and organ/hematologic function Lab orders and baseline tests specific to treatment regimen  The treatment plan start date, drug sequencing, and pre-medications Prior authorization status  Patient's documented medication list, including drug-drug interaction screen and prescriptions for anti-emetics and supportive care specific to the treatment regimen The drug concentrations, fluid compatibility, administration routes, and timing of the medications to be used The patient's access for treatment and lifetime cumulative dose history, if applicable  The patient's medication allergies and previous infusion related reactions, if applicable   Changes made to treatment plan:  drug offset times and inf time 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  Follow up needed:  Pending authorization for treatment    Kennith Center, Pharm.D., CPP 06/18/2022'@2'$ :54 PM

## 2022-06-24 ENCOUNTER — Other Ambulatory Visit: Payer: Self-pay

## 2022-06-25 ENCOUNTER — Other Ambulatory Visit: Payer: Self-pay

## 2022-06-25 ENCOUNTER — Inpatient Hospital Stay: Payer: Managed Care, Other (non HMO) | Attending: Gynecologic Oncology

## 2022-06-25 ENCOUNTER — Inpatient Hospital Stay: Payer: Managed Care, Other (non HMO)

## 2022-06-25 VITALS — BP 133/76 | HR 94 | Temp 98.6°F | Resp 18

## 2022-06-25 DIAGNOSIS — Z5189 Encounter for other specified aftercare: Secondary | ICD-10-CM | POA: Insufficient documentation

## 2022-06-25 DIAGNOSIS — C549 Malignant neoplasm of corpus uteri, unspecified: Secondary | ICD-10-CM

## 2022-06-25 DIAGNOSIS — R7989 Other specified abnormal findings of blood chemistry: Secondary | ICD-10-CM | POA: Diagnosis not present

## 2022-06-25 DIAGNOSIS — Z5111 Encounter for antineoplastic chemotherapy: Secondary | ICD-10-CM | POA: Insufficient documentation

## 2022-06-25 DIAGNOSIS — C786 Secondary malignant neoplasm of retroperitoneum and peritoneum: Secondary | ICD-10-CM | POA: Insufficient documentation

## 2022-06-25 LAB — CBC WITH DIFFERENTIAL (CANCER CENTER ONLY)
Abs Immature Granulocytes: 0.05 10*3/uL (ref 0.00–0.07)
Basophils Absolute: 0.1 10*3/uL (ref 0.0–0.1)
Basophils Relative: 1 %
Eosinophils Absolute: 0.7 10*3/uL — ABNORMAL HIGH (ref 0.0–0.5)
Eosinophils Relative: 9 %
HCT: 34.6 % — ABNORMAL LOW (ref 36.0–46.0)
Hemoglobin: 12.1 g/dL (ref 12.0–15.0)
Immature Granulocytes: 1 %
Lymphocytes Relative: 20 %
Lymphs Abs: 1.6 10*3/uL (ref 0.7–4.0)
MCH: 29.6 pg (ref 26.0–34.0)
MCHC: 35 g/dL (ref 30.0–36.0)
MCV: 84.6 fL (ref 80.0–100.0)
Monocytes Absolute: 0.9 10*3/uL (ref 0.1–1.0)
Monocytes Relative: 11 %
Neutro Abs: 4.4 10*3/uL (ref 1.7–7.7)
Neutrophils Relative %: 58 %
Platelet Count: 362 10*3/uL (ref 150–400)
RBC: 4.09 MIL/uL (ref 3.87–5.11)
RDW: 15.9 % — ABNORMAL HIGH (ref 11.5–15.5)
WBC Count: 7.6 10*3/uL (ref 4.0–10.5)
nRBC: 0 % (ref 0.0–0.2)

## 2022-06-25 LAB — CMP (CANCER CENTER ONLY)
ALT: 31 U/L (ref 0–44)
AST: 25 U/L (ref 15–41)
Albumin: 4.4 g/dL (ref 3.5–5.0)
Alkaline Phosphatase: 70 U/L (ref 38–126)
Anion gap: 9 (ref 5–15)
BUN: 12 mg/dL (ref 8–23)
CO2: 27 mmol/L (ref 22–32)
Calcium: 9.6 mg/dL (ref 8.9–10.3)
Chloride: 101 mmol/L (ref 98–111)
Creatinine: 0.86 mg/dL (ref 0.44–1.00)
GFR, Estimated: >60 mL/min (ref >60)
Glucose, Bld: 139 mg/dL — ABNORMAL HIGH (ref 70–99)
Potassium: 3.6 mmol/L (ref 3.5–5.1)
Sodium: 137 mmol/L (ref 135–145)
Total Bilirubin: 0.3 mg/dL (ref 0.3–1.2)
Total Protein: 7.4 g/dL (ref 6.5–8.1)

## 2022-06-25 MED ORDER — SODIUM CHLORIDE 0.9% FLUSH
10.0000 mL | INTRAVENOUS | Status: DC | PRN
  Administered 2022-06-25: 10 mL

## 2022-06-25 MED ORDER — PROCHLORPERAZINE MALEATE 10 MG PO TABS
10.0000 mg | ORAL_TABLET | Freq: Once | ORAL | Status: AC
  Administered 2022-06-25: 10 mg via ORAL

## 2022-06-25 MED ORDER — SODIUM CHLORIDE 0.9 % IV SOLN: Freq: Once | INTRAVENOUS | Status: AC

## 2022-06-25 MED ORDER — PROCHLORPERAZINE MALEATE 10 MG PO TABS
ORAL_TABLET | ORAL | Status: AC
  Filled 2022-06-25: qty 1

## 2022-06-25 MED ORDER — HEPARIN SOD (PORK) LOCK FLUSH 100 UNIT/ML IV SOLN
500.0000 [IU] | Freq: Once | INTRAVENOUS | Status: AC | PRN
  Administered 2022-06-25: 500 [IU]

## 2022-06-25 MED ORDER — SODIUM CHLORIDE 0.9% FLUSH
10.0000 mL | Freq: Once | INTRAVENOUS | Status: AC
  Administered 2022-06-25: 10 mL

## 2022-06-25 MED ORDER — SODIUM CHLORIDE 0.9 % IV SOLN
900.0000 mg/m2 | Freq: Once | INTRAVENOUS | Status: AC
  Administered 2022-06-25: 1976 mg via INTRAVENOUS
  Filled 2022-06-25: qty 51.97

## 2022-06-25 NOTE — Patient Instructions (Addendum)
[facility]  Discharge Instructions: Thank you for choosing [facility] to provide your oncology and hematology care.   If you have a lab appointment with the Cancer Center, please go directly to the Cancer Center and check in at the registration area.   Wear comfortable clothing and clothing appropriate for easy access to any Portacath or PICC line.   We strive to give you quality time with your provider. You may need to reschedule your appointment if you arrive late (15 or more minutes).  Arriving late affects you and other patients whose appointments are after yours.  Also, if you miss three or more appointments without notifying the office, you may be dismissed from the clinic at the provider's discretion.      For prescription refill requests, have your pharmacy contact our office and allow 72 hours for refills to be completed.    Today you received the following chemotherapy and/or immunotherapy agents Gemzar      To help prevent nausea and vomiting after your treatment, we encourage you to take your nausea medication as directed.  BELOW ARE SYMPTOMS THAT SHOULD BE REPORTED IMMEDIATELY: *FEVER GREATER THAN 100.4 F (38 C) OR HIGHER *CHILLS OR SWEATING *NAUSEA AND VOMITING THAT IS NOT CONTROLLED WITH YOUR NAUSEA MEDICATION *UNUSUAL SHORTNESS OF BREATH *UNUSUAL BRUISING OR BLEEDING *URINARY PROBLEMS (pain or burning when urinating, or frequent urination) *BOWEL PROBLEMS (unusual diarrhea, constipation, pain near the anus) TENDERNESS IN MOUTH AND THROAT WITH OR WITHOUT PRESENCE OF ULCERS (sore throat, sores in mouth, or a toothache) UNUSUAL RASH, SWELLING OR PAIN  UNUSUAL VAGINAL DISCHARGE OR ITCHING   Items with * indicate a potential emergency and should be followed up as soon as possible or go to the Emergency Department if any problems should occur.  Please show the CHEMOTHERAPY ALERT CARD or IMMUNOTHERAPY ALERT CARD at check-in to the  Emergency Department and triage nurse.  Should you have questions after your visit or need to cancel or reschedule your appointment, please contact [facility]  Dept: [phone]  and follow the prompts.  Office hours are 8:00 a.m. to 4:30 p.m. Monday - Friday. Please note that voicemails left after 4:00 p.m. may not be returned until the following business day.  We are closed weekends and major holidays. You have access to a nurse at all times for urgent questions. Please call the main number to the clinic Dept: [phone] and follow the prompts.   For any non-urgent questions, you may also contact your provider using MyChart. We now offer e-Visits for anyone 18 and older to request care online for non-urgent symptoms. For details visit [email or web address].   Also download the MyChart app! Go to the app store, search "MyChart", open the app, select [facility], and log in with your MyChart username and password.  Masks are optional in the cancer centers. If you would like for your care team to wear a mask while they are taking care of you, please let them know. You may have one support person who is at least 63 years old accompany you for your appointments. Gemcitabine Injection What is this medication? GEMCITABINE (jem SYE ta been) treats some types of cancer. It works by slowing down the growth of cancer cells. This medicine may be used for other purposes; ask your health care provider or pharmacist if you have questions. COMMON BRAND NAME(S): Gemzar, Infugem What should I tell my care team before I take this medication?   They need to know if you have any of these conditions: Blood disorders Infection Kidney disease Liver disease Lung or breathing disease, such as asthma or COPD Recent or ongoing radiation therapy An unusual or allergic reaction to gemcitabine, other medications, foods, dyes, or preservatives If you or your partner are pregnant or trying  to get pregnant Breast-feeding How should I use this medication? This medication is injected into a vein. It is given by your care team in a hospital or clinic setting. Talk to your care team about the use of this medication in children. Special care may be needed. Overdosage: If you think you have taken too much of this medicine contact a poison control center or emergency room at once. NOTE: This medicine is only for you. Do not share this medicine with others. What if I miss a dose? Keep appointments for follow-up doses. It is important not to miss your dose. Call your care team if you are unable to keep an appointment. What may interact with this medication? Interactions have not been studied. This list may not describe all possible interactions. Give your health care provider a list of all the medicines, herbs, non-prescription drugs, or dietary supplements you use. Also tell them if you smoke, drink alcohol, or use illegal drugs. Some items may interact with your medicine. What should I watch for while using this medication? Your condition will be monitored carefully while you are receiving this medication. This medication may make you feel generally unwell. This is not uncommon, as chemotherapy can affect healthy cells as well as cancer cells. Report any side effects. Continue your course of treatment even though you feel ill unless your care team tells you to stop. In some cases, you may be given additional medications to help with side effects. Follow all directions for their use. This medication may increase your risk of getting an infection. Call your care team for advice if you get a fever, chills, sore throat, or other symptoms of a cold or flu. Do not treat yourself. Try to avoid being around people who are sick. This medication may increase your risk to bruise or bleed. Call your care team if you notice any unusual bleeding. Be careful brushing or flossing your teeth or using a  toothpick because you may get an infection or bleed more easily. If you have any dental work done, tell your dentist you are receiving this medication. Avoid taking medications that contain aspirin, acetaminophen, ibuprofen, naproxen, or ketoprofen unless instructed by your care team. These medications may hide a fever. Talk to your care team if you or your partner wish to become pregnant or think you might be pregnant. This medication can cause serious birth defects if taken during pregnancy and for 6 months after the last dose. A negative pregnancy test is required before starting this medication. A reliable form of contraception is recommended while taking this medication and for 6 months after the last dose. Talk to your care team about effective forms of contraception. Do not father a child while taking this medication and for 3 months after the last dose. Use a condom while having sex during this time period. Do not breastfeed while taking this medication and for at least 1 week after the last dose. This medication may cause infertility. Talk to your care team if you are concerned about your fertility. What side effects may I notice from receiving this medication? Side effects that you should report to your care team as soon as   possible: Allergic reactions--skin rash, itching, hives, swelling of the face, lips, tongue, or throat Capillary leak syndrome--stomach or muscle pain, unusual weakness or fatigue, feeling faint or lightheaded, decrease in the amount of urine, swelling of the ankles, hands, or feet, trouble breathing Infection--fever, chills, cough, sore throat, wounds that don't heal, pain or trouble when passing urine, general feeling of discomfort or being unwell Liver injury--right upper belly pain, loss of appetite, nausea, light-colored stool, dark yellow or brown urine, yellowing skin or eyes, unusual weakness or fatigue Low red blood cell level--unusual weakness or fatigue, dizziness,  headache, trouble breathing Lung injury--shortness of breath or trouble breathing, cough, spitting up blood, chest pain, fever Stomach pain, bloody diarrhea, pale skin, unusual weakness or fatigue, decrease in the amount of urine, which may be signs of hemolytic uremic syndrome Sudden and severe headache, confusion, change in vision, seizures, which may be signs of posterior reversible encephalopathy syndrome (PRES) Unusual bruising or bleeding Side effects that usually do not require medical attention (report to your care team if they continue or are bothersome): Diarrhea Drowsiness Hair loss Nausea Pain, redness, or swelling with sores inside the mouth or throat Vomiting This list may not describe all possible side effects. Call your doctor for medical advice about side effects. You may report side effects to FDA at 1-800-FDA-1088. Where should I keep my medication? This medication is given in a hospital or clinic. It will not be stored at home. NOTE: This sheet is a summary. It may not cover all possible information. If you have questions about this medicine, talk to your doctor, pharmacist, or health care provider.  2023 Elsevier/Gold Standard (2022-03-04 00:00:00)  

## 2022-06-29 ENCOUNTER — Telehealth: Payer: Self-pay

## 2022-06-29 NOTE — Telephone Encounter (Signed)
Returned her call. Yesterday she had temp 99.9 with chills and feeling clammy. Today her temp earlier  was 100.1, she took tylenol earlier. She checked temperature while on the phone and temp is now 99. Denies chill today. Denies other symptoms. She is able to eat and drink with not problems. Gemzar given on 8/11.  She is complaining of some constipation. Instructed to take Miralax BID and Senokot 2 tabs TID as needed for constipation. She verbalized understanding.  Any other recommendations?

## 2022-06-29 NOTE — Telephone Encounter (Signed)
Called and given below message. She verbalized understanding and will call the office back for questions/concerns.

## 2022-06-29 NOTE — Telephone Encounter (Signed)
I recommend just observation Call us back if temp is greater than 101.5

## 2022-07-01 ENCOUNTER — Telehealth: Payer: Self-pay | Admitting: *Deleted

## 2022-07-01 MED FILL — Dexamethasone Sodium Phosphate Inj 100 MG/10ML: INTRAMUSCULAR | Qty: 1 | Status: AC

## 2022-07-01 NOTE — Telephone Encounter (Signed)
Received Telephone Advice fax from after hours [facility]: Patient called 06/30/22 @ 2113 w/request to review instructions for dexamethasone.  Contacted patient - Directions reviewed with patient - she verbalized understanding. She stated she received message from  her insurance that injection for Saturday (pegfilgrastim) was not authorized. She asked what the process was for this.   Contacted B. Harvell in [place] on patient behalf. Per Ms. Harvell, initial request for pegfilgrastim denied by insurance as Neulasta is preferred. Neulasta is authorized.  Contacted patient - LVM with above info. Encouraged patient to contact office for further questions or concerns.

## 2022-07-02 ENCOUNTER — Inpatient Hospital Stay (HOSPITAL_COMMUNITY)
Admission: EM | Admit: 2022-07-02 | Discharge: 2022-07-05 | DRG: 864 | Disposition: A | Discharge status: Home / Self Care | Payer: Managed Care, Other (non HMO) | Attending: Family Medicine | Admitting: Family Medicine

## 2022-07-02 ENCOUNTER — Inpatient Hospital Stay: Payer: Managed Care, Other (non HMO)

## 2022-07-02 ENCOUNTER — Encounter (HOSPITAL_COMMUNITY): Payer: Self-pay | Admitting: Internal Medicine

## 2022-07-02 ENCOUNTER — Other Ambulatory Visit: Payer: Self-pay

## 2022-07-02 ENCOUNTER — Emergency Department (HOSPITAL_COMMUNITY): Payer: Managed Care, Other (non HMO)

## 2022-07-02 VITALS — BP 112/68 | HR 81 | Temp 98.0°F | Resp 18 | Wt 220.8 lb

## 2022-07-02 DIAGNOSIS — C55 Malignant neoplasm of uterus, part unspecified: Secondary | ICD-10-CM | POA: Diagnosis present

## 2022-07-02 DIAGNOSIS — C7800 Secondary malignant neoplasm of unspecified lung: Secondary | ICD-10-CM | POA: Diagnosis present

## 2022-07-02 DIAGNOSIS — Z20822 Contact with and (suspected) exposure to covid-19: Secondary | ICD-10-CM | POA: Diagnosis present

## 2022-07-02 DIAGNOSIS — C549 Malignant neoplasm of corpus uteri, unspecified: Secondary | ICD-10-CM

## 2022-07-02 DIAGNOSIS — Z800 Family history of malignant neoplasm of digestive organs: Secondary | ICD-10-CM

## 2022-07-02 DIAGNOSIS — K219 Gastro-esophageal reflux disease without esophagitis: Secondary | ICD-10-CM | POA: Diagnosis present

## 2022-07-02 DIAGNOSIS — Z79899 Other long term (current) drug therapy: Secondary | ICD-10-CM

## 2022-07-02 DIAGNOSIS — R7401 Elevation of levels of liver transaminase levels: Secondary | ICD-10-CM | POA: Diagnosis present

## 2022-07-02 DIAGNOSIS — Z90710 Acquired absence of both cervix and uterus: Secondary | ICD-10-CM

## 2022-07-02 DIAGNOSIS — R7989 Other specified abnormal findings of blood chemistry: Secondary | ICD-10-CM | POA: Diagnosis present

## 2022-07-02 DIAGNOSIS — Z7982 Long term (current) use of aspirin: Secondary | ICD-10-CM

## 2022-07-02 DIAGNOSIS — T451X5A Adverse effect of antineoplastic and immunosuppressive drugs, initial encounter: Secondary | ICD-10-CM | POA: Diagnosis present

## 2022-07-02 DIAGNOSIS — R509 Fever, unspecified: Principal | ICD-10-CM | POA: Diagnosis present

## 2022-07-02 DIAGNOSIS — I10 Essential (primary) hypertension: Secondary | ICD-10-CM | POA: Diagnosis present

## 2022-07-02 HISTORY — DX: Malignant neoplasm of corpus uteri, unspecified: C54.9

## 2022-07-02 LAB — CBC WITH DIFFERENTIAL (CANCER CENTER ONLY)
Abs Immature Granulocytes: 0.04 10*3/uL (ref 0.00–0.07)
Basophils Absolute: 0 10*3/uL (ref 0.0–0.1)
Basophils Relative: 0 %
Eosinophils Absolute: 0.1 10*3/uL (ref 0.0–0.5)
Eosinophils Relative: 1 %
HCT: 28.5 % — ABNORMAL LOW (ref 36.0–46.0)
Hemoglobin: 10.1 g/dL — ABNORMAL LOW (ref 12.0–15.0)
Immature Granulocytes: 1 %
Lymphocytes Relative: 19 %
Lymphs Abs: 1.2 10*3/uL (ref 0.7–4.0)
MCH: 29.9 pg (ref 26.0–34.0)
MCHC: 35.4 g/dL (ref 30.0–36.0)
MCV: 84.3 fL (ref 80.0–100.0)
Monocytes Absolute: 0.4 10*3/uL (ref 0.1–1.0)
Monocytes Relative: 7 %
Neutro Abs: 4.6 10*3/uL (ref 1.7–7.7)
Neutrophils Relative %: 72 %
Platelet Count: 209 10*3/uL (ref 150–400)
RBC: 3.38 MIL/uL — ABNORMAL LOW (ref 3.87–5.11)
RDW: 15.6 % — ABNORMAL HIGH (ref 11.5–15.5)
WBC Count: 6.3 10*3/uL (ref 4.0–10.5)
nRBC: 0 % (ref 0.0–0.2)

## 2022-07-02 LAB — CMP (CANCER CENTER ONLY)
ALT: 154 U/L — ABNORMAL HIGH (ref 0–44)
AST: 123 U/L — ABNORMAL HIGH (ref 15–41)
Albumin: 4 g/dL (ref 3.5–5.0)
Alkaline Phosphatase: 64 U/L (ref 38–126)
Anion gap: 8 (ref 5–15)
BUN: 14 mg/dL (ref 8–23)
CO2: 29 mmol/L (ref 22–32)
Calcium: 9.7 mg/dL (ref 8.9–10.3)
Chloride: 97 mmol/L — ABNORMAL LOW (ref 98–111)
Creatinine: 0.87 mg/dL (ref 0.44–1.00)
GFR, Estimated: >60 mL/min (ref >60)
Glucose, Bld: 229 mg/dL — ABNORMAL HIGH (ref 70–99)
Potassium: 3.5 mmol/L (ref 3.5–5.1)
Sodium: 134 mmol/L — ABNORMAL LOW (ref 135–145)
Total Bilirubin: 0.3 mg/dL (ref 0.3–1.2)
Total Protein: 7.1 g/dL (ref 6.5–8.1)

## 2022-07-02 LAB — URINALYSIS, ROUTINE W REFLEX MICROSCOPIC
Bilirubin Urine: NEGATIVE
Glucose, UA: NEGATIVE mg/dL
Ketones, ur: NEGATIVE mg/dL
Nitrite: NEGATIVE
Protein, ur: NEGATIVE mg/dL
Specific Gravity, Urine: 1.013 (ref 1.005–1.030)
pH: 6 (ref 5.0–8.0)

## 2022-07-02 MED ORDER — SODIUM CHLORIDE 0.9% FLUSH
10.0000 mL | Freq: Once | INTRAVENOUS | Status: AC
  Administered 2022-07-02: 10 mL

## 2022-07-02 MED ORDER — SODIUM CHLORIDE 0.9 % IV SOLN
75.0000 mg/m2 | Freq: Once | INTRAVENOUS | Status: AC
  Administered 2022-07-02: 160 mg via INTRAVENOUS
  Filled 2022-07-02: qty 16

## 2022-07-02 MED ORDER — SODIUM CHLORIDE 0.9 % IV SOLN: Freq: Once | INTRAVENOUS | Status: AC

## 2022-07-02 MED ORDER — SODIUM CHLORIDE 0.9% FLUSH
10.0000 mL | INTRAVENOUS | Status: DC | PRN
  Administered 2022-07-02: 10 mL

## 2022-07-02 MED ORDER — SODIUM CHLORIDE 0.9 % IV SOLN
900.0000 mg/m2 | Freq: Once | INTRAVENOUS | Status: AC
  Administered 2022-07-02: 1976 mg via INTRAVENOUS
  Filled 2022-07-02: qty 51.97

## 2022-07-02 MED ORDER — HEPARIN SOD (PORK) LOCK FLUSH 100 UNIT/ML IV SOLN
500.0000 [IU] | Freq: Once | INTRAVENOUS | Status: AC | PRN
  Administered 2022-07-02: 500 [IU]

## 2022-07-02 MED ORDER — SODIUM CHLORIDE 0.9 % IV SOLN
10.0000 mg | Freq: Once | INTRAVENOUS | Status: AC
  Administered 2022-07-02: 10 mg via INTRAVENOUS
  Filled 2022-07-02: qty 10

## 2022-07-02 NOTE — Progress Notes (Signed)
Per Dr. Burr Medico, "I spoke with pharmacy and ok to treat with elevated AST/ALT."

## 2022-07-02 NOTE — Patient Instructions (Signed)
[facility]  Discharge Instructions: Thank you for choosing [facility] to provide your oncology and hematology care.   If you have a lab appointment with the [facility], please go directly to the [facility] and check in at the registration area.   Wear comfortable clothing and clothing appropriate for easy access to any Portacath or PICC line.   We strive to give you quality time with your provider. You may need to reschedule your appointment if you arrive late (15 or more minutes).  Arriving late affects you and other patients whose appointments are after yours.  Also, if you miss three or more appointments without notifying the office, you may be dismissed from the clinic at the provider's discretion.      For prescription refill requests, have your pharmacy contact our office and allow 72 hours for refills to be completed.    Today you received the following chemotherapy and/or immunotherapy agents gemzar, docetaxel      To help prevent nausea and vomiting after your treatment, we encourage you to take your nausea medication as directed.  BELOW ARE SYMPTOMS THAT SHOULD BE REPORTED IMMEDIATELY: *FEVER GREATER THAN 100.4 F (38 C) OR HIGHER *CHILLS OR SWEATING *NAUSEA AND VOMITING THAT IS NOT CONTROLLED WITH YOUR NAUSEA MEDICATION *UNUSUAL SHORTNESS OF BREATH *UNUSUAL BRUISING OR BLEEDING *URINARY PROBLEMS (pain or burning when urinating, or frequent urination) *BOWEL PROBLEMS (unusual diarrhea, constipation, pain near the anus) TENDERNESS IN MOUTH AND THROAT WITH OR WITHOUT PRESENCE OF ULCERS (sore throat, sores in mouth, or a toothache) UNUSUAL RASH, SWELLING OR PAIN  UNUSUAL VAGINAL DISCHARGE OR ITCHING   Items with * indicate a potential emergency and should be followed up as soon as possible or go to the Emergency Department if any problems should occur.  Please show the CHEMOTHERAPY ALERT CARD or IMMUNOTHERAPY ALERT CARD at  check-in to the Emergency Department and triage nurse.  Should you have questions after your visit or need to cancel or reschedule your appointment, please contact [facility]  Dept: [phone]  and follow the prompts.  Office hours are 8:00 a.m. to 4:30 p.m. Monday - Friday. Please note that voicemails left after 4:00 p.m. may not be returned until the following business day.  We are closed weekends and major holidays. You have access to a nurse at all times for urgent questions. Please call the main number to the clinic Dept: [phone] and follow the prompts.   For any non-urgent questions, you may also contact your provider using MyChart. We now offer e-Visits for anyone 58 and older to request care online for non-urgent symptoms. For details visit [email or web address].   Also download the MyChart app! Go to the app store, search "MyChart", open the app, select [facility], and log in with your MyChart username and password.  Masks are optional in the cancer centers. If you would like for your care team to wear a mask while they are taking care of you, please let them know. You may have one support person who is at least 63 years old accompany you for your appointments.

## 2022-07-02 NOTE — H&P (Signed)
History and Physical    Leah Diaz BWG:[id number] DOB: 1961-11-01 DOA: 07/02/2022  PCP: Curlene Labrum, MD  Patient coming from: Home  I have personally briefly reviewed patient's old medical records in [place]  Chief Complaint: Fever  HPI: Leah Diaz is a 61 y.o. 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  Patient had adjustment to her chemotherapy.  She was started on gemcitabine on 06/25/2022 and underwent second infusion on 8/17 with addition of docetaxel.  Labs prior to infusion were notable for AST 123, ALT 154.  WBC was 6.3 without neutropenia.  At home she developed new fever up to 101.1 F.  She had associated chills but no diaphoresis, nausea, vomiting, chest pain, dyspnea, cough, abdominal pain, dysuria, diarrhea, skin changes/rash.  She called her oncology clinic primarily to ask what she can take for her fever as she was concerned about taking Tylenol with elevated liver enzymes.  Due to new fever she was advised to come to the ED for further evaluation.  ED Course  Labs/Imaging on admission: I have personally reviewed following labs and imaging studies.  Initial vitals showed BP 141/88, pulse 127, RR 18, temp 100.5 F, SPO2 98% on room air.  Outpatient labs from the morning showed sodium 134, potassium 3.5, bicarb 29, BUN 14, creatinine 0.87, serum glucose 229, AST 123, ALT 154, alk phos 64, total bilirubin 0.3, WBC 6.3 without neutropenia or leukopenia, hemoglobin 10.1, platelets 209,000.  Repeat labs pending.  Urinalysis negative for UTI.  Blood and urine cultures and SARS-CoV-2 PCR ordered and pending.  2 view chest x-ray negative for acute abnormality.  EDP discussed with on-call oncology, Dr. Irene Limbo, who recommended medical admission to  evaluate for infectious process.  The hospitalist service was consulted to admit for further evaluation and management.  Review of Systems: All systems reviewed and are negative except as documented in history of present illness above.   Past Medical History:  Diagnosis Date   Anemia    Arthritis    Asthma    remote history as a teenager   GERD (gastroesophageal reflux disease)    Hypertension    Menopausal symptoms 05/05/2011   Migraines 05/05/2011   Seasonal allergies    Uterine sarcoma ([place])     Past Surgical History:  Procedure Laterality Date   ABDOMINAL HYSTERECTOMY     BREAST BIOPSY     x 2 benign   COLONOSCOPY N/A 09/18/2021   Procedure: COLONOSCOPY;  Surgeon: Rogene Houston, MD;  Location: [facility];  Service: Endoscopy;  Laterality: N/A;  8:05   HYSTEROSCOPY  11/06/2020   IR IMAGING GUIDED PORT INSERTION  04/08/2022   ROBOTIC ASSISTED TOTAL HYSTERECTOMY WITH BILATERAL SALPINGO OOPHERECTOMY N/A 06/03/2021   Procedure: XI ROBOTIC ASSISTED TOTAL HYSTERECTOMY FOR UTERUS GREATER THAN 250G WITH BILATERAL SALPINGO OOPHORECTOMY;  Surgeon: Everitt Amber, MD;  Location: [facility];  Service: Gynecology;  Laterality: N/A;   TUBAL LIGATION  05/05/2011    Social History:  reports that she has never smoked. She has never used smokeless tobacco. She reports that she does not drink alcohol and does not use drugs.  No Known Allergies  Family History  Problem Relation Age of Onset   Colon cancer Maternal Uncle    Pancreatic cancer Maternal Grandmother      Prior to Admission medications  Medication Sig Start Date End Date Taking? Authorizing Provider  aspirin 81 MG EC tablet Take by mouth daily.    [provider]  cetirizine (ZYRTEC) 10 MG tablet Take 10 mg by mouth daily.    [provider]  Cholecalciferol (VITAMIN D3 PO) Take 1 tablet by mouth daily.    [provider]  dexamethasone (DECADRON) 4 MG tablet Take 2 tabs the day before Taxotere.  Then daily after chemo for 2 days. 06/15/22   [name], MD  fluticasone (FLONASE) 50 MCG/ACT nasal spray Place 2 sprays into both nostrils 2 (two) times daily as needed for allergies or rhinitis.    [provider]  losartan-hydrochlorothiazide (HYZAAR) 100-25 MG tablet Take 1 tablet by mouth daily. 03/09/21   [provider]  Multiple Vitamins-Minerals (ZINC PO) Take 50 mg by mouth daily.    [provider]  omeprazole (PRILOSEC) 20 MG capsule Take 1 capsule (20 mg total) by mouth 2 (two) times daily. 10/07/11 06/03/21  Setzer, Rona Ravens, NP  triamcinolone cream (KENALOG) 0.1 % Apply 1 application topically 2 (two) times daily as needed (psoriasis). 02/27/21   [provider]  prochlorperazine (COMPAZINE) 10 MG tablet Take 1 tablet (10 mg total) by mouth every 6 (six) hours as needed (Nausea or vomiting). 05/28/22 06/15/22  [name], MD    Physical Exam: Vitals:   07/02/22 2233 07/02/22 2300 07/02/22 2330 07/03/22 0000  BP: 131/80 127/75 (!) 149/79 132/72  Pulse: (!) 107 (!) 105 (!) 112 (!) 103  Resp: _0 Temp:      TempSrc:      SpO2: 98% 100% 100% 100%   Constitutional: Resting in bed, NAD, calm, comfortable Eyes: EOMI, lids and conjunctivae normal ENMT: Mucous membranes are moist. Posterior pharynx clear of any exudate or lesions.Normal dentition.  Neck: normal, supple, no masses. Respiratory: clear to auscultation bilaterally, no wheezing, no crackles. Normal respiratory effort. No accessory muscle use.  Cardiovascular: Regular rate and rhythm, no murmurs / rubs / gallops. No extremity edema. 2+ pedal pulses.  Port-A-Cath in place right upper chest. Abdomen: no tenderness, no masses palpated.  Musculoskeletal: no clubbing / cyanosis. No joint deformity upper and lower extremities. Good ROM, no contractures. Normal muscle tone.  Skin: no rashes, lesions, ulcers. No induration Neurologic: Sensation intact. Strength 5/5 in all 4.  Psychiatric:  Normal judgment and insight. Alert and oriented x 3. Normal mood.   EKG: Not performed.  Assessment/Plan Principal Problem:   Fever Active Problems:   Elevated LFTs   Uterine sarcoma ([place])   Leah Diaz is a 61 y.o. 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  Assessment and Plan: * Fever New fever after chemotherapy earlier in the day.  101.1 F at home, Tmax 100.5 F in the hospital.  EDP discussed with oncology who recommended admission to rule out infectious source.  Suspect most likely chemotherapy related.  Denies any other symptoms.  UA negative for UTI, CXR negative for pneumonia.  SARS-CoV-2 PCR negative.  No neutropenia on CBC. -Follow blood cultures -If vitals and labs stable, can likely discharge to home in the morning  Elevated LFTs AST 123 and ALT 154 on labs obtained prior to her chemotherapy infusion on 8/17.  Has not had any abdominal pain, nausea, vomiting.  Likely secondary to chemotherapy. -Follow repeat  CMP  Uterine sarcoma (HCC) Metastatic uterine sarcoma with peritoneal and pulmonary involvement.  S/p total hysterectomy and b/l SOO.  Recently started on docetaxel and gemcitabine.  Follows with oncology, Dr. [name]. -As above, suspect fever and LFT elevation secondary chemotherapy -If no obvious infectious source declares itself then I think it would be okay for her to continue her Decadron as ordered for next 2 days postchemotherapy -Scheduled for Neulasta on 8/19  DVT prophylaxis: enoxaparin (LOVENOX) injection 40 mg Start: 07/03/22 2200 Code Status: Full code, confirmed on admission Family Communication: Spouse at bedside Disposition Plan: From home and likely discharge to home pending clinical progress Consults called: EDP discussed with  oncology Severity of Illness: The appropriate patient status for this patient is OBSERVATION. Observation status is judged to be reasonable and necessary in order to provide the required intensity of service to ensure the patient's safety. The patient's presenting symptoms, physical exam findings, and initial radiographic and laboratory data in the context of their medical condition is felt to place them at decreased risk for further clinical deterioration. Furthermore, it is anticipated that the patient will be medically stable for discharge from the hospital within 2 midnights of admission.   Zada Finders MD [facility]  If 7PM-7AM, please contact night-coverage [email or web address]  07/03/2022, 12:30 AM

## 2022-07-02 NOTE — Progress Notes (Signed)
The following biosimilar Neulasta has been selected for use in this patient.  Larene Beach, PharmD

## 2022-07-02 NOTE — ED Notes (Signed)
ED Provider at bedside. 

## 2022-07-02 NOTE — ED Provider Notes (Signed)
[facility] DEPT Provider Note   CSN: [id number] Arrival date & time: 07/02/22  2111     History  Chief Complaint  Patient presents with   Fever    Leah Diaz is a 61 y.o. female with active uterine cancer status post hysterectomy with possible mets to lung presenting to the emergency room with fever.  Pain patient did have chemotherapy infusion today.  Patient reports that she was told by her infusion center today that if she develops a fever, of 101.5 F, to present to the emergency room.  Patient reports that after her infusion today, she developed a fever of 101.1 F and decided to present to the emergency room.  She reports that last week she also had a fever after infusion.  She thinks that it could just be a fever reaction to the infusion she received today as she had one yesterday last week as well.  Patient denies any cough, shortness of breath, chest pain, urinary symptoms, sick contacts, sore throat, or GI symptoms.  She does report some chills.   Fever Associated symptoms: chills   Associated symptoms: no chest pain, no congestion, no cough, no diarrhea, no dysuria, no nausea, no sore throat and no vomiting        Home Medications Prior to Admission medications   Medication Sig Start Date End Date Taking? Authorizing Provider  aspirin 81 MG EC tablet Take by mouth daily.    [provider]  cetirizine (ZYRTEC) 10 MG tablet Take 10 mg by mouth daily.    [provider]  Cholecalciferol (VITAMIN D3 PO) Take 1 tablet by mouth daily.    [provider]  dexamethasone (DECADRON) 4 MG tablet Take 2 tabs the day before Taxotere. Then daily after chemo for 2 days. 06/15/22   [name], MD  fluticasone (FLONASE) 50 MCG/ACT nasal spray Place 2 sprays into both nostrils 2 (two) times daily as needed for allergies or rhinitis.    [provider]  losartan-hydrochlorothiazide (HYZAAR) 100-25 MG tablet Take 1  tablet by mouth daily. 03/09/21   [provider]  Multiple Vitamins-Minerals (ZINC PO) Take 50 mg by mouth daily.    [provider]  omeprazole (PRILOSEC) 20 MG capsule Take 1 capsule (20 mg total) by mouth 2 (two) times daily. 10/07/11 06/03/21  Setzer, Rona Ravens, NP  triamcinolone cream (KENALOG) 0.1 % Apply 1 application topically 2 (two) times daily as needed (psoriasis). 02/27/21   [provider]  prochlorperazine (COMPAZINE) 10 MG tablet Take 1 tablet (10 mg total) by mouth every 6 (six) hours as needed (Nausea or vomiting). 05/28/22 06/15/22  [name], MD      Allergies    Patient has no known allergies.    Review of Systems   Review of Systems  Constitutional:  Positive for chills and fever.  HENT:  Negative for congestion and sore throat.   Respiratory:  Negative for cough and shortness of breath.   Cardiovascular:  Negative for chest pain.  Gastrointestinal:  Negative for diarrhea, nausea and vomiting.  Genitourinary:  Negative for dysuria and hematuria.    Physical Exam Updated Vital Signs BP (!) 149/79   Pulse (!) 112   Temp (!) 100.5 F (38.1 C) (Oral)   Resp 17   SpO2 100%  Physical Exam  General: Alert and orientated x3. Patient is resting comfortably in bed in no acute distress  Head: Normocephalic, atraumatic  Cardio: Tachycardic with rate of 116. No murmurs, rubs  or gallops. 2+ pulses to bilateral upper and lower extremities  Pulmonary: Clear to ausculation bilaterally with no rales, rhonchi, and crackles  Abdomen: Soft, nontender with normoactive bowel sounds with no rebound or guarding  Skin: No rashes noted  MSK: 5/5 strength to upper and lower extremities.   ED Results / Procedures / Treatments   Labs (all labs ordered are listed, but only abnormal results are displayed) Labs Reviewed  URINALYSIS, ROUTINE W REFLEX MICROSCOPIC - Abnormal; Notable for the following components:      Result Value   Hgb urine dipstick MODERATE (*)     Leukocytes,Ua TRACE (*)    Bacteria, UA RARE (*)    All other components within normal limits  CULTURE, BLOOD (ROUTINE X 2)  CULTURE, BLOOD (ROUTINE X 2)  SARS CORONAVIRUS 2 BY RT PCR  URINE CULTURE  COMPREHENSIVE METABOLIC PANEL  CBC WITH DIFFERENTIAL/PLATELET  LACTIC ACID, PLASMA  LACTIC ACID, PLASMA  LIPASE, BLOOD    EKG None  Radiology DG Chest 2 View  Result Date: 07/02/2022 CLINICAL DATA:  History of uterine cancer with fevers, initial encounter EXAM: CHEST - 2 VIEW COMPARISON:  06/10/2022 FINDINGS: Cardiac shadow is within normal limits. Right chest wall port is again seen and stable. No parenchymal nodules in the lungs are not well appreciated likely related to their small size. No bony abnormality is seen. IMPRESSION: No acute abnormality noted. Known pulmonary nodules are not well appreciated on this exam likely related to size. Electronically Signed   By: Inez Catalina M.D.   On: 07/02/2022 23:05    Procedures Procedures    Medications Ordered in ED Medications - No data to display  ED Course/ Medical Decision Making/ A&P                           Medical Decision Making This is a 61 year old female with a history of urine sarcome status post hysterectomy with possible mets to lung, presenting with fever.  Patient had chemoinfusion today.  Patient was instructed to come to emergency room if patient had fever of 101.5 F, present to the emergency room.  Patient states she had 101.1 F fever at home.  She did have elevated liver enzymes today, so she decided instead of taking Tylenol to come to the ED.  Patient denies any URI symptoms, UTI symptoms, diarrhea, nausea, vomiting, headache, or any other complaints.  On exam, patient is tachycardic at 116.  Given that this is a patient with current chemotherapy, will be aggressive with testing.  Patient is immunosuppressed with being on chemotherapy.  Patient had a fever initially at 100.5 F, but during my exam, the  temperature is 99.4 F.  Will obtain CBC, CMP, lactate, UA, urine culture, blood cultures, and chest x-ray.  Given earlier today she had elevated liver enzymes of AST 123 and ALT 154.  We will get lipase here.  We will also consult oncology.  Amount and/or Complexity of Data Reviewed External Data Reviewed: notes. Labs: ordered. Radiology: ordered.  Risk Decision regarding hospitalization.   2312: Spoke to oncologist Dr. Rubbie Battiest on the phone.  He agrees with septic work-up.  He agrees with admitting patient given that the patient has elevated liver enzymes.  He did mention possible drug-induced hepatitis.  He states that he agrees with the work-up, and that we should admit.  He does state that the admitting physician should touch base with Dr. [name] who normally follows the patient to touch  base. UA showing trace leukocytes and rare bacteria.  Chest x-ray showing nothing acute, but is showing the same pulmonary nodules that have been seen on previous CT. awaiting other labs.  2349: Spoke with hospitalist.  Patient will be accepted by the hospitalist.  Patient will be evaluated for fever after chemotherapy.  Plan will be to rule out any infectious etiology.  CBC CMP, lactate pending.  COVID still pending.  Blood culture and urine culture pending.          Final Clinical Impression(s) / ED Diagnoses Final diagnoses:  Fever, unspecified fever cause    Rx / DC Orders ED Discharge Orders     None         Leigh Aurora, DO 07/02/22 2352    Tegeler, Gwenyth Allegra, MD 07/03/22 0009

## 2022-07-02 NOTE — ED Triage Notes (Signed)
Patient coming to ED for evaluation of fever after receiving a new chemo treatment today.  Reports no complaints after treatment.  Once home, developed a fever.  No cough, body aches, headache, or sore throat.  No signs of reactions noted at this time

## 2022-07-02 NOTE — ED Provider Triage Note (Signed)
Emergency Medicine Provider Triage Evaluation Note  Leah Diaz , a 61 y.o. female  was evaluated in triage.  Pt complains of sudden onset of worsening fever.  Given first dose of new chemotherapy treatment today.  When she got home noticed a fever around 99.6, continue to elevate to about 101.3.  Was informed by oncology that she needs to be seen if it gets over 101.5, wanted to take Tylenol however has had elevated LFTs and was unsure whether she can take this.  No other symptoms at this time.  History of reaction also happens with the other recently attempted chemotherapy drug.  With diagnosis of uterine sarcoma.  Review of Systems  Positive:  Negative: See above  Physical Exam  BP (!) 141/88   Pulse (!) 127   Temp (!) 100.5 F (38.1 C) (Oral)   Resp 18   SpO2 98%  Gen:   Awake, no distress   Resp:  Normal effort  MSK:   Moves extremities without difficulty  Other:  Abdomen soft, nontender  Medical Decision Making  Medically screening exam initiated at 9:49 PM.  Appropriate orders placed.  Joselin Crandell was informed that the remainder of the evaluation will be completed by another provider, this initial triage assessment does not replace that evaluation, and the importance of remaining in the ED until their evaluation is complete.     Prince Rome, PA-C 70/48/88 2154

## 2022-07-03 ENCOUNTER — Encounter (HOSPITAL_COMMUNITY): Payer: Self-pay | Admitting: Internal Medicine

## 2022-07-03 DIAGNOSIS — R7989 Other specified abnormal findings of blood chemistry: Secondary | ICD-10-CM

## 2022-07-03 DIAGNOSIS — I10 Essential (primary) hypertension: Secondary | ICD-10-CM | POA: Diagnosis present

## 2022-07-03 DIAGNOSIS — Z7982 Long term (current) use of aspirin: Secondary | ICD-10-CM | POA: Diagnosis not present

## 2022-07-03 DIAGNOSIS — Z20822 Contact with and (suspected) exposure to covid-19: Secondary | ICD-10-CM | POA: Diagnosis present

## 2022-07-03 DIAGNOSIS — R7401 Elevation of levels of liver transaminase levels: Secondary | ICD-10-CM | POA: Diagnosis present

## 2022-07-03 DIAGNOSIS — C55 Malignant neoplasm of uterus, part unspecified: Secondary | ICD-10-CM | POA: Diagnosis present

## 2022-07-03 DIAGNOSIS — C549 Malignant neoplasm of corpus uteri, unspecified: Secondary | ICD-10-CM

## 2022-07-03 DIAGNOSIS — K219 Gastro-esophageal reflux disease without esophagitis: Secondary | ICD-10-CM | POA: Diagnosis present

## 2022-07-03 DIAGNOSIS — Z90710 Acquired absence of both cervix and uterus: Secondary | ICD-10-CM | POA: Diagnosis not present

## 2022-07-03 DIAGNOSIS — T451X5A Adverse effect of antineoplastic and immunosuppressive drugs, initial encounter: Secondary | ICD-10-CM | POA: Diagnosis present

## 2022-07-03 DIAGNOSIS — Z800 Family history of malignant neoplasm of digestive organs: Secondary | ICD-10-CM | POA: Diagnosis not present

## 2022-07-03 DIAGNOSIS — C7800 Secondary malignant neoplasm of unspecified lung: Secondary | ICD-10-CM | POA: Diagnosis present

## 2022-07-03 DIAGNOSIS — R509 Fever, unspecified: Secondary | ICD-10-CM | POA: Diagnosis present

## 2022-07-03 DIAGNOSIS — Z79899 Other long term (current) drug therapy: Secondary | ICD-10-CM | POA: Diagnosis not present

## 2022-07-03 LAB — COMPREHENSIVE METABOLIC PANEL
ALT: 225 U/L — ABNORMAL HIGH (ref 0–44)
ALT: 260 U/L — ABNORMAL HIGH (ref 0–44)
AST: 200 U/L — ABNORMAL HIGH (ref 15–41)
AST: 266 U/L — ABNORMAL HIGH (ref 15–41)
Albumin: 3.2 g/dL — ABNORMAL LOW (ref 3.5–5.0)
Albumin: 3.5 g/dL (ref 3.5–5.0)
Alkaline Phosphatase: 56 U/L (ref 38–126)
Alkaline Phosphatase: 60 U/L (ref 38–126)
Anion gap: 10 (ref 5–15)
Anion gap: 9 (ref 5–15)
BUN: 14 mg/dL (ref 8–23)
BUN: 15 mg/dL (ref 8–23)
CO2: 24 mmol/L (ref 22–32)
CO2: 25 mmol/L (ref 22–32)
Calcium: 8.4 mg/dL — ABNORMAL LOW (ref 8.9–10.3)
Calcium: 8.7 mg/dL — ABNORMAL LOW (ref 8.9–10.3)
Chloride: 106 mmol/L (ref 98–111)
Chloride: 99 mmol/L (ref 98–111)
Creatinine, Ser: 0.89 mg/dL (ref 0.44–1.00)
Creatinine, Ser: 0.91 mg/dL (ref 0.44–1.00)
GFR, Estimated: >60 mL/min (ref >60)
GFR, Estimated: >60 mL/min (ref >60)
Glucose, Bld: 123 mg/dL — ABNORMAL HIGH (ref 70–99)
Glucose, Bld: 144 mg/dL — ABNORMAL HIGH (ref 70–99)
Potassium: 3.5 mmol/L (ref 3.5–5.1)
Potassium: 3.7 mmol/L (ref 3.5–5.1)
Sodium: 133 mmol/L — ABNORMAL LOW (ref 135–145)
Sodium: 140 mmol/L (ref 135–145)
Total Bilirubin: 0.4 mg/dL (ref 0.3–1.2)
Total Bilirubin: 0.5 mg/dL (ref 0.3–1.2)
Total Protein: 6.2 g/dL — ABNORMAL LOW (ref 6.5–8.1)
Total Protein: 7 g/dL (ref 6.5–8.1)

## 2022-07-03 LAB — CBC
HCT: 27.5 % — ABNORMAL LOW (ref 36.0–46.0)
Hemoglobin: 9.3 g/dL — ABNORMAL LOW (ref 12.0–15.0)
MCH: 29.8 pg (ref 26.0–34.0)
MCHC: 33.8 g/dL (ref 30.0–36.0)
MCV: 88.1 fL (ref 80.0–100.0)
Platelets: 160 10*3/uL (ref 150–400)
RBC: 3.12 MIL/uL — ABNORMAL LOW (ref 3.87–5.11)
RDW: 16 % — ABNORMAL HIGH (ref 11.5–15.5)
WBC: 6.6 10*3/uL (ref 4.0–10.5)
nRBC: 0 % (ref 0.0–0.2)

## 2022-07-03 LAB — CBC WITH DIFFERENTIAL/PLATELET
Abs Immature Granulocytes: 0.04 10*3/uL (ref 0.00–0.07)
Basophils Absolute: 0 10*3/uL (ref 0.0–0.1)
Basophils Relative: 0 %
Eosinophils Absolute: 0 10*3/uL (ref 0.0–0.5)
Eosinophils Relative: 0 %
HCT: 29.3 % — ABNORMAL LOW (ref 36.0–46.0)
Hemoglobin: 10 g/dL — ABNORMAL LOW (ref 12.0–15.0)
Immature Granulocytes: 1 %
Lymphocytes Relative: 12 %
Lymphs Abs: 1 10*3/uL (ref 0.7–4.0)
MCH: 29.7 pg (ref 26.0–34.0)
MCHC: 34.1 g/dL (ref 30.0–36.0)
MCV: 86.9 fL (ref 80.0–100.0)
Monocytes Absolute: 0.4 10*3/uL (ref 0.1–1.0)
Monocytes Relative: 5 %
Neutro Abs: 6.8 10*3/uL (ref 1.7–7.7)
Neutrophils Relative %: 82 %
Platelets: 198 10*3/uL (ref 150–400)
RBC: 3.37 MIL/uL — ABNORMAL LOW (ref 3.87–5.11)
RDW: 15.9 % — ABNORMAL HIGH (ref 11.5–15.5)
WBC: 8.3 10*3/uL (ref 4.0–10.5)
nRBC: 0 % (ref 0.0–0.2)

## 2022-07-03 LAB — SARS CORONAVIRUS 2 BY RT PCR: SARS Coronavirus 2 by RT PCR: NEGATIVE

## 2022-07-03 LAB — LACTIC ACID, PLASMA
Lactic Acid, Venous: 1.9 mmol/L (ref 0.5–1.9)
Lactic Acid, Venous: 2.2 mmol/L (ref 0.5–1.9)

## 2022-07-03 LAB — HIV ANTIBODY (ROUTINE TESTING W REFLEX): HIV Screen 4th Generation wRfx: NONREACTIVE

## 2022-07-03 MED ORDER — SODIUM CHLORIDE 0.9 % IV SOLN
2.0000 g | Freq: Three times a day (TID) | INTRAVENOUS | Status: DC
  Administered 2022-07-03 – 2022-07-05 (×6): 2 g via INTRAVENOUS
  Filled 2022-07-03 (×6): qty 12.5

## 2022-07-03 MED ORDER — SENNOSIDES-DOCUSATE SODIUM 8.6-50 MG PO TABS
1.0000 | ORAL_TABLET | Freq: Every evening | ORAL | Status: DC | PRN
  Administered 2022-07-03 – 2022-07-05 (×2): 1 via ORAL
  Filled 2022-07-03 (×2): qty 1

## 2022-07-03 MED ORDER — ONDANSETRON HCL 4 MG/2ML IJ SOLN: 4.0000 mg | Freq: Four times a day (QID) | INTRAMUSCULAR | Status: DC | PRN

## 2022-07-03 MED ORDER — ENOXAPARIN SODIUM 40 MG/0.4ML IJ SOSY
40.0000 mg | PREFILLED_SYRINGE | INTRAMUSCULAR | Status: DC
  Administered 2022-07-03 – 2022-07-04 (×2): 40 mg via SUBCUTANEOUS
  Filled 2022-07-03 (×2): qty 0.4

## 2022-07-03 MED ORDER — SODIUM CHLORIDE 0.9 % IV BOLUS
1000.0000 mL | Freq: Once | INTRAVENOUS | Status: AC
Start: 2022-07-03 — End: 2022-07-03
  Administered 2022-07-03: 1000 mL via INTRAVENOUS

## 2022-07-03 MED ORDER — PANTOPRAZOLE SODIUM 40 MG PO TBEC
40.0000 mg | DELAYED_RELEASE_TABLET | Freq: Every day | ORAL | Status: DC
  Administered 2022-07-03 – 2022-07-05 (×3): 40 mg via ORAL
  Filled 2022-07-03 (×3): qty 1

## 2022-07-03 MED ORDER — IBUPROFEN 200 MG PO TABS
400.0000 mg | ORAL_TABLET | Freq: Four times a day (QID) | ORAL | Status: DC | PRN
  Administered 2022-07-03: 400 mg via ORAL
  Filled 2022-07-03: qty 2

## 2022-07-03 MED ORDER — ASPIRIN 81 MG PO TBEC
81.0000 mg | DELAYED_RELEASE_TABLET | Freq: Every day | ORAL | Status: DC
  Administered 2022-07-03 – 2022-07-05 (×3): 81 mg via ORAL
  Filled 2022-07-03 (×3): qty 1

## 2022-07-03 MED ORDER — LOSARTAN POTASSIUM-HCTZ 100-25 MG PO TABS: 1.0000 | ORAL_TABLET | Freq: Every day | ORAL | Status: DC

## 2022-07-03 MED ORDER — LOSARTAN POTASSIUM 50 MG PO TABS
100.0000 mg | ORAL_TABLET | Freq: Every day | ORAL | Status: DC
  Administered 2022-07-03 – 2022-07-05 (×3): 100 mg via ORAL
  Filled 2022-07-03 (×3): qty 2

## 2022-07-03 MED ORDER — ONDANSETRON HCL 4 MG PO TABS: 4.0000 mg | ORAL_TABLET | Freq: Four times a day (QID) | ORAL | Status: DC | PRN

## 2022-07-03 MED ORDER — LORATADINE 10 MG PO TABS
10.0000 mg | ORAL_TABLET | Freq: Every day | ORAL | Status: DC
  Administered 2022-07-03 – 2022-07-05 (×3): 10 mg via ORAL
  Filled 2022-07-03 (×3): qty 1

## 2022-07-03 MED ORDER — CHLORHEXIDINE GLUCONATE CLOTH 2 % EX PADS
6.0000 | MEDICATED_PAD | Freq: Every day | CUTANEOUS | Status: DC
  Administered 2022-07-03 – 2022-07-05 (×3): 6 via TOPICAL

## 2022-07-03 MED ORDER — HYDROCHLOROTHIAZIDE 25 MG PO TABS
25.0000 mg | ORAL_TABLET | Freq: Every day | ORAL | Status: DC
  Administered 2022-07-03 – 2022-07-05 (×3): 25 mg via ORAL
  Filled 2022-07-03 (×3): qty 1

## 2022-07-03 MED ORDER — DEXAMETHASONE 4 MG PO TABS
8.0000 mg | ORAL_TABLET | Freq: Every day | ORAL | Status: AC
  Administered 2022-07-03 – 2022-07-04 (×2): 8 mg via ORAL
  Filled 2022-07-03 (×2): qty 2

## 2022-07-03 NOTE — Progress Notes (Signed)
[facility]  PROGRESS NOTE  Leah Diaz GEX:[id number] DOB: 12/06/1960 DOA: 07/02/2022 PCP: Curlene Labrum, MD   Brief HPI:   61 year old female with medical history of metastatic uterine sarcoma, peritoneal and pulmonary metastasis, s/p hysterectomy and bilateral salpingo-oophorectomy on active chemotherapy with docetaxel and gemcitabine, hypertension, GERD came to ED for evaluation of fever. She was started on gemcitabine on 06/25/2022 and underwent second infusion on 07/02/2022 with addition of docetaxel.  Labs prior to infusion were notable for AST 123, ALT 154.  WBC was 6.3 without neutropenia. Patient without fever at home with temperature 101.1.  Also had chills but no diaphoresis, nausea vomiting chest pain dyspnea, cough, abdominal pain, dysuria, diarrhea, skin rash. She called oncology office and was asked to take Tylenol however patient did not take Tylenol as she had elevated liver enzymes.  As fever did not improve, she came to ED for further evaluation. All the work-up for infectious etiology is negative to date.   Subjective   Patient seen and examined, denies any complaints.  No chest pain or shortness of breath.   Assessment/Plan:    Fever -Likely postchemotherapy/transaminitis -She has been afebrile for past 24 hours -Culture data is negative to date -Patient started on empiric cefepime per oncology -We will follow urine and blood culture results  Transaminitis -Likely due to chemotherapy -Follow LFTs in a.m.  Uterine sarcoma ([facility]) Metastatic uterine sarcoma with peritoneal and pulmonary involvement.  S/p total hysterectomy and b/l SOO.  Recently started on docetaxel and gemcitabine.  Follows with oncology, Dr. [name]. -As above, suspect fever and LFT elevation secondary chemotherapy -Continue Decadron -Patient due for G-CSF as outpatient on 07/04/2022; oncology will reschedule the visit    Medications     dexamethasone  8 mg Oral Daily    enoxaparin (LOVENOX) injection  40 mg Subcutaneous Q24H     Data Reviewed:   CBG:  No results for input(s): "GLUCAP" in the last 168 hours.  SpO2: 100 %    Vitals:   07/03/22 0955 07/03/22 1018 07/03/22 1130 07/03/22 1200  BP: 126/73  115/60 121/70  Pulse: 88  84 85  Resp: '20  16 14  '$ Temp:  98.2 F (36.8 C)    TempSrc:  Oral    SpO2: 100%  99% 100%      Data Reviewed:  Basic Metabolic Panel: Recent Labs  Lab 07/02/22 0758 07/02/22 2354 07/03/22 0511  NA 134* 133* 140  K 3.5 3.7 3.5  CL 97* 99 106  CO2 '29 24 25  '$ GLUCOSE 229* 144* 123*  BUN '14 15 14  '$ CREATININE 0.87 0.89 0.91  CALCIUM 9.7 8.7* 8.4*    CBC: Recent Labs  Lab 07/02/22 0758 07/02/22 2354 07/03/22 0511  WBC 6.3 8.3 6.6  NEUTROABS 4.6 6.8  --   HGB 10.1* 10.0* 9.3*  HCT 28.5* 29.3* 27.5*  MCV 84.3 86.9 88.1  PLT 209 198 160    LFT Recent Labs  Lab 07/02/22 0758 07/02/22 2354 07/03/22 0511  AST 123* 266* 200*  ALT 154* 260* 225*  ALKPHOS 64 60 56  BILITOT 0.3 0.4 0.5  PROT 7.1 7.0 6.2*  ALBUMIN 4.0 3.5 3.2*     Antibiotics: Anti-infectives (From admission, onward)    Start     Dose/Rate Route Frequency Ordered Stop   07/03/22 1400  ceFEPIme (MAXIPIME) 2 g in sodium chloride 0.9 % 100 mL IVPB        2 g 200 mL/hr over 30 Minutes Intravenous Every 8 hours  07/03/22 1326          DVT prophylaxis: Lovenox  Code Status: Full code  Family Communication: No family at bedside   CONSULTS oncology   Objective    Physical Examination:   General-appears in no acute distress Heart-S1-S2, regular, no murmur auscultated Lungs-clear to auscultation bilaterally, no wheezing or crackles auscultated Abdomen-soft, nontender, no organomegaly Extremities-no edema in the lower extremities Neuro-alert, oriented x3, no focal deficit noted   Status is: Inpatient:             Oswald Hillock   [facility] If 7PM-7AM, please contact night-coverage at  [email or web address], Office  [phone]   07/03/2022, 3:10 PM  LOS: 0 days

## 2022-07-03 NOTE — Progress Notes (Addendum)
HEMATOLOGY-ONCOLOGY PROGRESS NOTE  I saw the patient, examined her and agree with documentation as follows ASSESSMENT AND PLAN: Uterine sarcoma (HCC) I have reviewed CT imaging with the patient and her husband Unfortunately, she has progressed on doxorubicin Recently started on gemcitabine and docetaxel, received day 8 cycle 1 on 07/02/2022 She will continue dexamethasone 8 mg p.o. daily today and tomorrow Due for G-CSF in our office on 07/04/2022; I will reschedule that visit next week Continue supportive care   Fever Likely due to transaminitis, although infection cannot be excluded Infectious work-up negative to date Monitor for fever Recommend antibiotics until cultures are negative  Transaminitis Likely due to medication Monitor   Goals of care, counseling/discussion Currently receiving palliative chemotherapy.  We will plan for ongoing chemotherapy as an outpatient.  Discharge planning Due to her immunocompromise state, I recommend her to be monitored in the hospital and to receive antibiotics over the weekend.  If cultures are negative, she can be discharged I will check on her on Monday.  If she is discharged by then, I will schedule outpatient follow-up and reschedule her G-CSF injection appointment  Kristin Curcio Ni Gorsuch, MD  SUBJECTIVE:  Developed a fever up to 101 at home last night Came to the ED for evaluation Chest x-ray negative She is not neutropenic Urine and blood cultures pending This morning, she feels well overall No fevers today Denies cough and shortness of breath Denies abdominal pain, nausea, vomiting  Oncology History Overview Note  High grade and LMS, 50% ER/PR positive dMMR normal, PD-L1 CPS 3%   Uterine sarcoma (HCC)  06/02/2021 Imaging   MRI pelvis Heterogeneous 9.0 cm intrauterine mass within the intramural/submucosal anterior fundus demonstrating interval growth, internal enhancement, and restricted diffusion suspicious for an  intrauterine leiomyosarcoma in this postmenopausal patient. No extra uterine extension. No evidence of metastatic disease within the pelvis.   Mild thickening of the endometrial stripe, likely related to entrapment by the adjacent uterine mass   06/03/2021 Initial Diagnosis   Uterine sarcoma (HCC)   06/03/2021 Cancer Staging   Staging form: Corpus Uteri - Sarcoma, AJCC 7th Edition - Clinical stage from 06/03/2021: FIGO Stage IVB (rT1b, N0, M1) - Signed by Gorsuch, Ni, MD on 03/31/2022 Diagnostic confirmation: Positive histology Stage prefix: Recurrence Biopsy of metastatic site performed: No Lymph-vascular invasion (LVI): LVI present/identified, NOS   06/03/2021 Pathology Results   FINAL MICROSCOPIC DIAGNOSIS:   A. UTERUS, CERVIX, BILATERAL FALLOPIAN TUBES AND OVARIES, HYSTERECTOMY:  - Uterus:       Mixed high grade uterine sarcoma, spanning 6 cm, see comment.       Extensive lymphovascular invasion.       See oncology table.  - Cervix: Benign squamous and endocervical mucosa. No dysplasia or  malignancy.  - Bilateral ovaries: Unremarkable. No malignancy.  - Bilateral fallopian tubes: Unremarkable. No malignancy.   ONCOLOGY TABLE:   UTERUS, SARCOMA: Resection   Procedure: Total hysterectomy and bilateral salpingo-oophorectomy  Specimen Integrity: Focally disrupted on posterior surface  Tumor Site: Anterior wall  Tumor Size: 6 cm  Histologic Type: High grade sarcoma, mixed. See comment.  Other Tissue/ Organ Involvement: Not identified  Lymphovascular Invasion: Present, extensive.  Margins: All margins negative for tumor  Regional Lymph Nodes: Not applicable (no lymph nodes submitted or found)  Distant Metastasis:       Distant Site(s) Involved: Not applicable  Pathologic Stage Classification (pTNM, AJCC 8th Edition): pT1b, pN not  assigned  Ancillary Studies: Can be performed upon request  Representative Tumor Block:   A6  Comment(s): The tumor has two morphologically different  components. One component consists of malignant spindle cells which can be seen arising from the smooth muscle. These foci are positive for SMA, desmin, cyclinD1 (focal), and CD10 (patchy). The other component consists of round to slightly spindle cells with abundant admixed vessels and occasional large pleomorphic cells. These areas are positive for SMA (patchy weak), desmin (focal), CD10 (variable with diffuse areas). Pancytokeratin is negative. 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   ADDENDUM:   PROGNOSTIC INDICATOR RESULTS:   Immunohistochemical and morphometric analysis performed manually    Estrogen Receptor:       POSITIVE, 50%, WEAK TO MODERATE STAINING  Progesterone Receptor:   POSITIVE, 50%, MODERATE TO STRONG STAINING   Reference Range Estrogen and Progesterone Receptor       Negative  0%       Positive  >1%    06/03/2021 Surgery   Surgeon: Donaciano Eva    Assistants: Dr Lahoma Crocker (an MD assistant was necessary for tissue manipulation, management of robotic instrumentation, retraction and positioning due to the complexity of the case and hospital policies).  Operation: Robotic-assisted laparoscopic total hysterectomy >250gm with bilateral salpingoophorectomy, minilaparotomy for specimen delivery   Surgeon: Donaciano Eva    Operative Findings:  : Bulky 16cm uterus with intra-uterine mass (consistent with a fibroid-like mass) , smooth normal appearing serosa, no suspicious bulky nodes. Normal ovaries bilaterally. Normal upper abdomen. Uterus too large to deliver vaginally in tact.    06/18/2021 Imaging   1. Multiple bilateral pulmonary nodules, measuring up to 9 mm. Most of these are perifissural and subpleural in location, likely lymph nodes. Nevertheless, close follow-up recommended to exclude metastatic disease. 2. 11 mm hypoattenuating lesion towards the dome of the liver,  indeterminate. MRI abdomen with and without contrast could be used to further evaluate as clinically warranted. 3. Aortic Atherosclerosis (ICD10-I70.0).   10/13/2021 Imaging   1. No acute findings within the abdomen or pelvis. No specific findings identified to suggest residual or recurrence of tumor. 2. Stable small pulmonary nodules within the left lower lobe.   03/18/2022 Imaging   1. Interval development of 2 soft tissue nodules in the anterior pelvis measuring 10 mm and 7 mm respectively. Close follow-up recommended to exclude metastatic disease. PET-CT may be warranted to further evaluate. 2. Development of 2 mm left upper lobe parenchymal nodule, nonspecific. Close attention on follow-up imaging recommended  3. Stable appearance of index bilateral pulmonary nodules identified previously. 4. Aortic Atherosclerosis (ICD10-I70.0).   04/02/2022 PET scan   1. Signs of local tumor recurrence noted at the vaginal cuff. 2. 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 New since 10/13/2021 and progressive when compared with 03/17/2022. 3. New focal area of increased uptake within the right biceps femoris muscle which is suspicious for skeletal muscle involvement. 4. Stable appearance of small pulmonary nodules described on 03/17/2022. These are too small to characterize by PET-CT.   04/09/2022 Procedure   Successful placement of a right IJ approach Power Port with ultrasound and fluoroscopic guidance. The catheter is ready for use.   04/09/2022 Echocardiogram    1. Left ventricular ejection fraction, by estimation, is 65 to 70%. The left ventricle has normal function. The left ventricle has no regional wall motion abnormalities. There is mild concentric left ventricular hypertrophy. Left ventricular diastolic parameters are consistent with Grade I diastolic dysfunction (impaired relaxation).  2. Right ventricular  systolic function is normal. The right  ventricular size is normal.  3. The mitral valve is normal in structure. No evidence of mitral valve regurgitation.  4. The aortic valve is tricuspid. Aortic valve regurgitation is not visualized. No aortic stenosis is present.  5. The inferior vena cava is normal in size with greater than 50% respiratory variability, suggesting right atrial pressure of 3 mmHg.   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 Status post hysterectomy and bilateral oophorectomy. Multiple newand enlarged peritoneal nodules throughout the low abdomen and pelvis, previously FDG avid and consistent with worsened locally recurrent and peritoneal metastatic disease. 2. Multiple small bilateral pulmonary nodules, several of which are slightly enlarged compared to prior examination, consistent with worsened pulmonary metastatic disease.   Aortic Atherosclerosis (ICD10-I70.0).     06/25/2022 -  Chemotherapy   Patient is on Treatment Plan : UTERINE UNDIFFERENTIATED / LEIOMYOSARCOMA Gemcitabine D1,8 + Docetaxel D8 (900/100) q21d        REVIEW OF SYSTEMS:   Review of Systems  Constitutional:        No fever today  HENT: Negative.    Eyes: Negative.   Respiratory: Negative.    Cardiovascular: Negative.   Gastrointestinal: Negative.   Genitourinary: Negative.   Skin: Negative.   Neurological: Negative.   Psychiatric/Behavioral: Negative.      I have reviewed the past medical history, past surgical history, social history and family history with the patient and they are unchanged from previous note.   PHYSICAL EXAMINATION: ECOG PERFORMANCE STATUS: 1 - Symptomatic but completely ambulatory  Vitals:   07/03/22 0600 07/03/22 0630  BP: 119/70 102/70  Pulse: 88 (!) 106  Resp: 16 (!) 21  Temp:  98.2 F (36.8 C)  SpO2: 100% 100%   There were no vitals filed for this visit.  Intake/Output from previous  day: 08/17 0701 - 08/18 0700 In: 1000 [IV Piggyback:1000] Out: -   Physical Exam Vitals reviewed.  Constitutional:      General: She is not in acute distress. Eyes:     General: No scleral icterus.    Conjunctiva/sclera: Conjunctivae normal.  Cardiovascular:     Rate and Rhythm: Normal rate.  Pulmonary:     Effort: Pulmonary effort is normal. No respiratory distress.  Abdominal:     Palpations: Abdomen is soft.  Skin:    General: Skin is warm and dry.  Neurological:     Mental Status: She is alert and oriented to person, place, and time.     LABORATORY DATA:  I have reviewed the data as listed    Latest Ref Rng & Units 07/03/2022    5:11 AM 07/02/2022   11:54 PM 07/02/2022    7:58 AM  CMP  Glucose 70 - 99 mg/dL 123  144  229   BUN 8 - 23 mg/dL 14  15  14   Creatinine 0.44 - 1.00 mg/dL 0.91  0.89  0.87   Sodium 135 - 145 mmol/L 140  133  134   Potassium 3.5 - 5.1 mmol/L 3.5  3.7  3.5   Chloride 98 - 111 mmol/L 106  99  97   CO2 22 - 32 mmol/L 25  24  29   Calcium 8.9 - 10.3 mg/dL 8.4  8.7  9.7   Total Protein 6.5 - 8.1 g/dL 6.2  7.0  7.1   Total Bilirubin   0.3 - 1.2 mg/dL 0.5  0.4  0.3   Alkaline Phos 38 - 126 U/L 56  60  64   AST 15 - 41 U/L 200  266  123   ALT 0 - 44 U/L 225  260  154     Lab Results  Component Value Date   WBC 6.6 07/03/2022   HGB 9.3 (L) 07/03/2022   HCT 27.5 (L) 07/03/2022   MCV 88.1 07/03/2022   PLT 160 07/03/2022   NEUTROABS 6.8 07/02/2022    No results found for: "CEA1", "CEA", "TXM468", "CA125", "PSA1"  DG Chest 2 View  Result Date: 07/02/2022 CLINICAL DATA:  History of uterine cancer with fevers, initial encounter EXAM: CHEST - 2 VIEW COMPARISON:  06/10/2022 FINDINGS: Cardiac shadow is within normal limits. Right chest wall port is again seen and stable. No parenchymal nodules in the lungs are not well appreciated likely related to their small size. No bony abnormality is seen. IMPRESSION: No acute abnormality noted. Known pulmonary  nodules are not well appreciated on this exam likely related to size. Electronically Signed   By: Inez Catalina M.D.   On: 07/02/2022 23:05   CT CHEST ABDOMEN PELVIS W CONTRAST  Result Date: 06/10/2022 CLINICAL DATA:  Uterine sarcoma, local recurrence and peritoneal carcinomatosis, pulmonary nodules suspicious for pulmonary metastases, assess treatment response, chemotherapy in progress, status post hysterectomy * Tracking Code: BO * EXAM: CT CHEST, ABDOMEN, AND PELVIS WITH CONTRAST TECHNIQUE: Multidetector CT imaging of the chest, abdomen and pelvis was performed following the standard protocol during bolus administration of intravenous contrast. RADIATION DOSE REDUCTION: This exam was performed according to the departmental dose-optimization program which includes automated exposure control, adjustment of the mA and/or kV according to patient size and/or use of iterative reconstruction technique. CONTRAST:  153m OMNIPAQUE IOHEXOL 300 MG/ML SOLN additional oral enteric contrast COMPARISON:  PET-CT, 04/02/2022, CT chest abdomen pelvis, 03/17/2022 FINDINGS: CT CHEST FINDINGS Cardiovascular: Right chest port catheter. Normal heart size. No pericardial effusion. Mediastinum/Nodes: No enlarged mediastinal, hilar, or axillary lymph nodes. Thyroid gland, trachea, and esophagus demonstrate no significant findings. Lungs/Pleura: Multiple small bilateral pulmonary nodules, several of which are slightly enlarged compared to prior examination, for example a 0.3 cm nodule of the lateral segment right middle lobe, previously no greater than 0.1 cm (series 4, image 74), and a 0.4 cm nodule of the posterior lingula, previously no greater than 0.2 cm (series 4, image 69). Largest nodule at the dependent right lung base measures 1.0 x 0.6 cm, previously 0.9 x 0.5 cm (series 4, image 74). No pleural effusion or pneumothorax. Musculoskeletal: No chest wall mass or suspicious osseous lesions identified. CT ABDOMEN PELVIS FINDINGS  Hepatobiliary: No solid liver abnormality is seen. Small fluid attenuation cyst or hemangioma the central liver dome (series 2, image 46). No gallstones, gallbladder wall thickening, or biliary dilatation. Pancreas: Unremarkable. No pancreatic ductal dilatation or surrounding inflammatory changes. Spleen: Normal in size without significant abnormality. Adrenals/Urinary Tract: Adrenal glands are unremarkable. Kidneys are normal, without renal calculi, solid lesion, or hydronephrosis. Bladder is unremarkable. Stomach/Bowel: Stomach is within normal limits. Appendix appears normal. No evidence of bowel wall thickening, distention, or inflammatory changes. Vascular/Lymphatic: Scattered aortic atherosclerosis. No enlarged abdominal or pelvic lymph nodes. Reproductive: Status post hysterectomy and bilateral oophorectomy. Other: No abdominal wall hernia or abnormality. No ascites. Multiple new and enlarged peritoneal nodules throughout the low abdomen and pelvis, nodule of the low midline ventral abdomen measuring 4.4 x 3.4 cm, previously 2.1 x 1.6 cm (series  2, image 105), dominant nodule of the vaginal cuff measuring 5.9 x 2.9 cm, previously 4.6 x 1.5 cm (series 2, image 114), and small nodule of the left lower quadrant measuring 1.1 x 0.8 cm, previously 0.4 cm (series 2, image 100). Musculoskeletal: No acute osseous findings. IMPRESSION: 1. Status post hysterectomy and bilateral oophorectomy. Multiple new and enlarged peritoneal nodules throughout the low abdomen and pelvis, previously FDG avid and consistent with worsened locally recurrent and peritoneal metastatic disease. 2. Multiple small bilateral pulmonary nodules, several of which are slightly enlarged compared to prior examination, consistent with worsened pulmonary metastatic disease. Aortic Atherosclerosis (ICD10-I70.0). Electronically Signed   By: Delanna Ahmadi M.D.   On: 06/10/2022 12:41     Future Appointments  Date Time Provider [place]   07/04/2022  8:00 AM [facility] [place] [facility] None  07/16/2022  8:00 AM [facility] [place] None  07/16/2022  8:40 AM [name], Jaque Dacy, MD [facility] None  07/16/2022  9:30 AM [facility] INFUSION [facility] None  07/23/2022  7:45 AM [facility] [place] [facility] None  07/23/2022  8:30 AM [facility] INFUSION [facility] None  07/25/2022  8:00 AM [facility] [place] [facility] None      LOS: 0 days

## 2022-07-03 NOTE — Progress Notes (Signed)
Pharmacy Antibiotic Note  Leah Diaz is a 61 y.o. female admitted on 07/02/2022 with sepsis.  Pharmacy has been consulted for cefepime dosing.  Plan: Cefepime 2 gr IV q8h      Temp (24hrs), Avg:98.9 F (37.2 C), Min:98.2 F (36.8 C), Max:100.5 F (38.1 C)  Recent Labs  Lab 07/02/22 0758 07/02/22 2354 07/03/22 0511  WBC 6.3 8.3 6.6  CREATININE 0.87 0.89 0.91  LATICACIDVEN  --  2.2* 1.9    Estimated Creatinine Clearance: 77.6 mL/min (by C-G formula based on SCr of 0.91 mg/dL).    No Known Allergies  Antimicrobials this admission: 8/18 cefepime >>     Dose adjustments this admission:   Microbiology results: 8/18 BCx: NGTD 8/18 UCx:      Thank you for allowing pharmacy to be a part of this patient's care.  Royetta Asal, PharmD, BCPS 07/03/2022 3:01 PM

## 2022-07-03 NOTE — Assessment & Plan Note (Signed)
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  S/p total hysterectomy and b/l SOO.  Recently started on docetaxel and gemcitabine.  Follows with oncology, Dr. [name]. -As above, suspect fever and LFT elevation secondary chemotherapy -If no obvious infectious source declares itself then I think it would be okay for her to continue her Decadron as ordered for next 2 days postchemotherapy -Scheduled for Neulasta on 8/19

## 2022-07-03 NOTE — Hospital Course (Signed)
Leah Diaz is a 61 y.o. 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

## 2022-07-03 NOTE — Assessment & Plan Note (Signed)
New fever after chemotherapy earlier in the day.  101.1 F at home, Tmax 100.5 F in the hospital.  EDP discussed with oncology who recommended admission to rule out infectious source.  Suspect most likely chemotherapy related.  Denies any other symptoms.  UA negative for UTI, CXR negative for pneumonia.  SARS-CoV-2 PCR negative.  No neutropenia on CBC. -Follow blood cultures -If vitals and labs stable, can likely discharge to home in the morning

## 2022-07-03 NOTE — ED Notes (Signed)
Notified Dr. Zada Finders of patient's lactic acid level of 2.2.

## 2022-07-03 NOTE — Assessment & Plan Note (Signed)
AST 123 and ALT 154 on labs obtained prior to her chemotherapy infusion on 8/17.  Has not had any abdominal pain, nausea, vomiting.  Likely secondary to chemotherapy. -Follow repeat CMP

## 2022-07-04 ENCOUNTER — Inpatient Hospital Stay: Payer: Managed Care, Other (non HMO)

## 2022-07-04 DIAGNOSIS — R7989 Other specified abnormal findings of blood chemistry: Secondary | ICD-10-CM | POA: Diagnosis not present

## 2022-07-04 DIAGNOSIS — R509 Fever, unspecified: Secondary | ICD-10-CM | POA: Diagnosis not present

## 2022-07-04 DIAGNOSIS — C549 Malignant neoplasm of corpus uteri, unspecified: Secondary | ICD-10-CM | POA: Diagnosis not present

## 2022-07-04 LAB — COMPREHENSIVE METABOLIC PANEL
ALT: 175 U/L — ABNORMAL HIGH (ref 0–44)
AST: 93 U/L — ABNORMAL HIGH (ref 15–41)
Albumin: 3.3 g/dL — ABNORMAL LOW (ref 3.5–5.0)
Alkaline Phosphatase: 57 U/L (ref 38–126)
Anion gap: 8 (ref 5–15)
BUN: 14 mg/dL (ref 8–23)
CO2: 27 mmol/L (ref 22–32)
Calcium: 9.1 mg/dL (ref 8.9–10.3)
Chloride: 102 mmol/L (ref 98–111)
Creatinine, Ser: 0.68 mg/dL (ref 0.44–1.00)
GFR, Estimated: >60 mL/min (ref >60)
Glucose, Bld: 96 mg/dL (ref 70–99)
Potassium: 3.4 mmol/L — ABNORMAL LOW (ref 3.5–5.1)
Sodium: 137 mmol/L (ref 135–145)
Total Bilirubin: 0.4 mg/dL (ref 0.3–1.2)
Total Protein: 6.7 g/dL (ref 6.5–8.1)

## 2022-07-04 LAB — CBC
HCT: 28.8 % — ABNORMAL LOW (ref 36.0–46.0)
Hemoglobin: 9.8 g/dL — ABNORMAL LOW (ref 12.0–15.0)
MCH: 29.6 pg (ref 26.0–34.0)
MCHC: 34 g/dL (ref 30.0–36.0)
MCV: 87 fL (ref 80.0–100.0)
Platelets: 155 10*3/uL (ref 150–400)
RBC: 3.31 MIL/uL — ABNORMAL LOW (ref 3.87–5.11)
RDW: 15.6 % — ABNORMAL HIGH (ref 11.5–15.5)
WBC: 6.9 10*3/uL (ref 4.0–10.5)
nRBC: 0 % (ref 0.0–0.2)

## 2022-07-04 LAB — URINE CULTURE: Culture: 30000 — AB

## 2022-07-04 MED ORDER — POTASSIUM CHLORIDE CRYS ER 20 MEQ PO TBCR
40.0000 meq | EXTENDED_RELEASE_TABLET | Freq: Once | ORAL | Status: AC
Start: 2022-07-04 — End: 2022-07-04
  Administered 2022-07-04: 40 meq via ORAL
  Filled 2022-07-04: qty 2

## 2022-07-04 NOTE — Progress Notes (Signed)
[facility]  PROGRESS NOTE  Leah Diaz IDP:[id number] DOB: May 16, 1961 DOA: 07/02/2022 PCP: Curlene Labrum, MD   Brief HPI:   61 year old female with medical history of metastatic uterine sarcoma, peritoneal and pulmonary metastasis, s/p hysterectomy and bilateral salpingo-oophorectomy on active chemotherapy with docetaxel and gemcitabine, hypertension, GERD came to ED for evaluation of fever. She was started on gemcitabine on 06/25/2022 and underwent second infusion on 07/02/2022 with addition of docetaxel.  Labs prior to infusion were notable for AST 123, ALT 154.  WBC was 6.3 without neutropenia. Patient without fever at home with temperature 101.1.  Also had chills but no diaphoresis, nausea vomiting chest pain dyspnea, cough, abdominal pain, dysuria, diarrhea, skin rash. She called oncology office and was asked to take Tylenol however patient did not take Tylenol as she had elevated liver enzymes.  As fever did not improve, she came to ED for further evaluation. All the work-up for infectious etiology is negative to date.   Subjective   Patient seen and examined, has been afebrile.  Blood cultures x2 have been negative to date.  Urine culture grew multiple species.   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  S/p total hysterectomy and b/l SOO.  Recently started on docetaxel and gemcitabine.  Follows with oncology, Dr. [name]. -As above, suspect fever and LFT elevation secondary chemotherapy -Continue Decadron -Patient due for G-CSF as outpatient on 07/04/2022; oncology will  reschedule the visit    Medications     aspirin EC  81 mg Oral Daily   Chlorhexidine Gluconate Cloth  6 each Topical Daily   enoxaparin (LOVENOX) injection  40 mg Subcutaneous Q24H   losartan  100 mg Oral Daily   And   hydrochlorothiazide  25 mg Oral Daily   loratadine  10 mg Oral Daily   pantoprazole  40 mg Oral Daily     Data Reviewed:   CBG:  No results for input(s): "GLUCAP" in the last 168 hours.  SpO2: 97 %    Vitals:   07/03/22 2347 07/04/22 0545 07/04/22 1557 07/04/22 1624  BP: 131/78 139/67 117/80   Pulse: 86 92 (!) 103 (!) 107  Resp: '18 18 19   '$ Temp: 98 F (36.7 C) 99.1 F (37.3 C) 99.6 F (37.6 C)   TempSrc: Oral Oral Oral   SpO2: 98% 97% 100% 97%  Weight:      Height:          Data Reviewed:  Basic Metabolic Panel: Recent Labs  Lab 07/02/22 0758 07/02/22 2354 07/03/22 0511 07/04/22 0621  NA 134* 133* 140 137  K 3.5 3.7 3.5 3.4*  CL 97* 99 106 102  CO2 '29 24 25 27  '$ GLUCOSE 229* 144* 123* 96  BUN '14 15 14 14  '$ CREATININE 0.87 0.89 0.91 0.68  CALCIUM 9.7 8.7* 8.4* 9.1    CBC: Recent Labs  Lab 07/02/22 0758 07/02/22 2354 07/03/22 0511 07/04/22 0621  WBC 6.3 8.3 6.6 6.9  NEUTROABS 4.6 6.8  --   --   HGB 10.1* 10.0* 9.3* 9.8*  HCT 28.5* 29.3* 27.5* 28.8*  MCV 84.3 86.9 88.1 87.0  PLT 209 198 160 155  LFT Recent Labs  Lab 07/02/22 0758 07/02/22 2354 07/03/22 0511 07/04/22 0621  AST 123* 266* 200* 93*  ALT 154* 260* 225* 175*  ALKPHOS 64 60 56 57  BILITOT 0.3 0.4 0.5 0.4  PROT 7.1 7.0 6.2* 6.7  ALBUMIN 4.0 3.5 3.2* 3.3*     Antibiotics: Anti-infectives (From admission, onward)    Start     Dose/Rate Route Frequency Ordered Stop   07/03/22 1400  ceFEPIme (MAXIPIME) 2 g in sodium chloride 0.9 % 100 mL IVPB        2 g 200 mL/hr over 30 Minutes Intravenous Every 8 hours 07/03/22 1326          DVT prophylaxis: Lovenox  Code Status: Full code  Family Communication: No family at bedside   CONSULTS  oncology   Objective    Physical Examination:   General-appears in no acute distress Heart-S1-S2, regular, no murmur auscultated Lungs-clear to auscultation bilaterally, no wheezing or crackles auscultated Abdomen-soft, nontender, no organomegaly Extremities-no edema in the lower extremities Neuro-alert, oriented x3, no focal deficit noted   Status is: Inpatient:             Leah Diaz   [facility] If 7PM-7AM, please contact night-coverage at [email or web address], Office  [phone]   07/04/2022, 5:11 PM  LOS: 1 day

## 2022-07-05 DIAGNOSIS — R509 Fever, unspecified: Secondary | ICD-10-CM | POA: Diagnosis not present

## 2022-07-05 DIAGNOSIS — C549 Malignant neoplasm of corpus uteri, unspecified: Secondary | ICD-10-CM | POA: Diagnosis not present

## 2022-07-05 DIAGNOSIS — R7989 Other specified abnormal findings of blood chemistry: Secondary | ICD-10-CM | POA: Diagnosis not present

## 2022-07-05 LAB — COMPREHENSIVE METABOLIC PANEL
ALT: 280 U/L — ABNORMAL HIGH (ref 0–44)
AST: 242 U/L — ABNORMAL HIGH (ref 15–41)
Albumin: 3.4 g/dL — ABNORMAL LOW (ref 3.5–5.0)
Alkaline Phosphatase: 58 U/L (ref 38–126)
Anion gap: 8 (ref 5–15)
BUN: 17 mg/dL (ref 8–23)
CO2: 26 mmol/L (ref 22–32)
Calcium: 8.9 mg/dL (ref 8.9–10.3)
Chloride: 104 mmol/L (ref 98–111)
Creatinine, Ser: 0.78 mg/dL (ref 0.44–1.00)
GFR, Estimated: >60 mL/min (ref >60)
Glucose, Bld: 115 mg/dL — ABNORMAL HIGH (ref 70–99)
Potassium: 3.8 mmol/L (ref 3.5–5.1)
Sodium: 138 mmol/L (ref 135–145)
Total Bilirubin: 0.5 mg/dL (ref 0.3–1.2)
Total Protein: 6.7 g/dL (ref 6.5–8.1)

## 2022-07-05 MED ORDER — HEPARIN SOD (PORK) LOCK FLUSH 100 UNIT/ML IV SOLN
500.0000 [IU] | Freq: Once | INTRAVENOUS | Status: AC
Start: 2022-07-05 — End: 2022-07-05
  Administered 2022-07-05: 500 [IU] via INTRAVENOUS
  Filled 2022-07-05: qty 5

## 2022-07-05 NOTE — Progress Notes (Signed)
Patient discharged to home with family. Discharge instructions reviewed with patient who verbalized understanding.  ?

## 2022-07-05 NOTE — Discharge Summary (Signed)
Physician Discharge Summary   Patient: Leah Diaz MRN: [id number] DOB: [birth year]  Admit date:     07/02/2022  Discharge date: 07/05/22  Discharge Physician: Oswald Hillock   PCP: Curlene Labrum, MD   Recommendations at discharge:   Follow-up oncology as outpatient  Discharge Diagnoses: Principal Problem:   Fever Active Problems:   Elevated LFTs   Uterine sarcoma ([facility])  Resolved Problems:   * No resolved hospital problems. *  Hospital Course:  61 year old female with medical history of metastatic uterine sarcoma, peritoneal and pulmonary metastasis, s/p hysterectomy and bilateral salpingo-oophorectomy on active chemotherapy with docetaxel and gemcitabine, hypertension, GERD came to ED for evaluation of fever. She was started on gemcitabine on 06/25/2022 and underwent second infusion on 07/02/2022 with addition of docetaxel.  Labs prior to infusion were notable for AST 123, ALT 154.  WBC was 6.3 without neutropenia. Patient without fever at home with temperature 101.1.  Also had chills but no diaphoresis, nausea vomiting chest pain dyspnea, cough, abdominal pain, dysuria, diarrhea, skin rash. She called oncology office and was asked to take Tylenol however patient did not take Tylenol as she had elevated liver enzymes.  As fever did not improve, she came to ED for further evaluation. All the work-up for infectious etiology is negative to date.  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  S/p  total hysterectomy and b/l SOO.  Recently started on docetaxel and gemcitabine.  Follows with oncology, Dr. [name]. -As above, suspect fever and LFT elevation secondary chemotherapy -Continue Decadron -Patient due for G-CSF as outpatient on 07/04/2022; oncology will reschedule the visit           Consultants:  Procedures performed:  Disposition: Home Diet recommendation:  Discharge Diet Orders (From admission, onward)     Start     Ordered   07/05/22 0000  Diet - low sodium heart healthy        07/05/22 1144           Regular diet DISCHARGE MEDICATION: Allergies as of 07/05/2022   No Known Allergies      Medication List     TAKE these medications    acetaminophen 500 MG tablet Commonly known as: TYLENOL Take 500 mg by mouth every 6 (six) hours as needed for mild pain, moderate pain or fever.   aspirin EC 81 MG tablet Take by mouth daily.   cetirizine 10 MG tablet Commonly known as: ZYRTEC Take 10 mg by mouth daily.   dexamethasone 4 MG tablet Commonly known as: DECADRON Take 2 tabs the day before Taxotere. Then daily after chemo for 2 days.   losartan-hydrochlorothiazide 100-25 MG tablet Commonly known as: HYZAAR Take 1 tablet by mouth daily.   omeprazole 20 MG capsule Commonly known as: PriLOSEC Take 1 capsule (20 mg total) by mouth 2 (two) times daily.   VITAMIN D3 PO Take 1 tablet by mouth daily.        Discharge Exam: Filed Weights   07/03/22 1600  Weight: 99 kg   General-appears in no acute distress Heart-S1-S2, regular, no murmur  auscultated Lungs-clear to auscultation bilaterally, no wheezing or crackles auscultated Abdomen-soft, nontender, no organomegaly Extremities-no edema in the lower extremities Neuro-alert, oriented x3, no focal deficit noted  Condition at discharge: good  The results of significant diagnostics from this hospitalization (including imaging, microbiology, ancillary and laboratory) are listed below for  reference.   Imaging Studies: DG Chest 2 View  Result Date: 07/02/2022 CLINICAL DATA:  History of uterine cancer with fevers, initial encounter EXAM: CHEST - 2 VIEW COMPARISON:  06/10/2022 FINDINGS: Cardiac shadow is within normal limits. Right chest wall port is again seen and stable. No parenchymal nodules in the lungs are not well appreciated likely related to their small size. No bony abnormality is seen. IMPRESSION: No acute abnormality noted. Known pulmonary nodules are not well appreciated on this exam likely related to size. Electronically Signed   By: Inez Catalina M.D.   On: 07/02/2022 23:05   CT CHEST ABDOMEN PELVIS W CONTRAST  Result Date: 06/10/2022 CLINICAL DATA:  Uterine sarcoma, local recurrence and peritoneal carcinomatosis, pulmonary nodules suspicious for pulmonary metastases, assess treatment response, chemotherapy in progress, status post hysterectomy * Tracking Code: BO * EXAM: CT CHEST, ABDOMEN, AND PELVIS WITH CONTRAST TECHNIQUE: Multidetector CT imaging of the chest, abdomen and pelvis was performed following the standard protocol during bolus administration of intravenous contrast. RADIATION DOSE REDUCTION: This exam was performed according to the departmental dose-optimization program which includes automated exposure control, adjustment of the mA and/or kV according to patient size and/or use of iterative reconstruction technique. CONTRAST:  16m OMNIPAQUE IOHEXOL 300 MG/ML SOLN additional oral enteric contrast COMPARISON:  PET-CT, 04/02/2022, CT chest abdomen pelvis, 03/17/2022 FINDINGS: CT CHEST FINDINGS Cardiovascular: Right chest port catheter. Normal heart size. No pericardial effusion. Mediastinum/Nodes: No enlarged mediastinal, hilar, or axillary lymph nodes. Thyroid gland, trachea, and esophagus demonstrate no significant findings. Lungs/Pleura: Multiple small bilateral pulmonary nodules, several of which are slightly enlarged compared to prior examination, for example a  0.3 cm nodule of the lateral segment right middle lobe, previously no greater than 0.1 cm (series 4, image 74), and a 0.4 cm nodule of the posterior lingula, previously no greater than 0.2 cm (series 4, image 69). Largest nodule at the dependent right lung base measures 1.0 x 0.6 cm, previously 0.9 x 0.5 cm (series 4, image 74). No pleural effusion or pneumothorax. Musculoskeletal: No chest wall mass or suspicious osseous lesions identified. CT ABDOMEN PELVIS FINDINGS Hepatobiliary: No solid liver abnormality is seen. Small fluid attenuation cyst or hemangioma the central liver dome (series 2, image 46). No gallstones, gallbladder wall thickening, or biliary dilatation. Pancreas: Unremarkable. No pancreatic ductal dilatation or surrounding inflammatory changes. Spleen: Normal in size without significant abnormality. Adrenals/Urinary Tract: Adrenal glands are unremarkable. Kidneys are normal, without renal calculi, solid lesion, or hydronephrosis. Bladder is unremarkable. Stomach/Bowel: Stomach is within normal limits. Appendix appears normal. No evidence of bowel wall thickening, distention, or inflammatory changes. Vascular/Lymphatic: Scattered aortic atherosclerosis. No enlarged abdominal or pelvic lymph nodes. Reproductive: Status post hysterectomy and bilateral oophorectomy. Other: No abdominal wall hernia or abnormality. No ascites. Multiple new and enlarged peritoneal nodules throughout the low abdomen and pelvis, nodule of the low midline ventral abdomen measuring 4.4 x 3.4 cm, previously 2.1 x 1.6 cm (series 2, image 105), dominant nodule of the vaginal cuff measuring 5.9 x 2.9 cm, previously 4.6 x 1.5 cm (series 2, image 114), and small nodule of the left lower quadrant measuring 1.1 x 0.8 cm, previously 0.4 cm (series 2, image 100). Musculoskeletal: No  acute osseous findings. IMPRESSION: 1. Status post hysterectomy and bilateral oophorectomy. Multiple new and enlarged peritoneal nodules throughout the low  abdomen and pelvis, previously FDG avid and consistent with worsened locally recurrent and peritoneal metastatic disease. 2. Multiple small bilateral pulmonary nodules, several of which are slightly enlarged compared to prior examination, consistent with worsened pulmonary metastatic disease. Aortic Atherosclerosis (ICD10-I70.0). Electronically Signed   By: Delanna Ahmadi M.D.   On: 06/10/2022 12:41    Microbiology: Results for orders placed or performed during the hospital encounter of 07/02/22  Urine Culture     Status: Abnormal   Collection Time: 07/02/22  9:50 PM   Specimen: Urine, Clean Catch  Result Value Ref Range Status   Specimen Description   Final    URINE, CLEAN CATCH Performed at [facility], [place] [address], [place], [place] [address]    Special Requests   Final    Immunocompromised Performed at [facility], [place] [address], [place], [place] [address]    Culture (A)  Final    30,000 COLONIES/mL MULTIPLE SPECIES PRESENT, SUGGEST RECOLLECTION   Report Status 07/04/2022 FINAL  Final  SARS Coronavirus 2 by RT PCR (hospital order, performed in [facility] hospital lab) *cepheid single result test* Anterior Nasal Swab     Status: None   Collection Time: 07/02/22 10:45 PM   Specimen: Anterior Nasal Swab  Result Value Ref Range Status   SARS Coronavirus 2 by RT PCR NEGATIVE NEGATIVE Final    Comment: (NOTE) SARS-CoV-2 target nucleic acids are NOT DETECTED.  The SARS-CoV-2 RNA is generally detectable in upper and lower respiratory specimens during the acute phase of infection. The lowest concentration of SARS-CoV-2 viral copies this assay can detect is 250 copies / mL. A negative result does not preclude SARS-CoV-2 infection and should not be used as the sole basis for treatment or other patient management decisions.  A negative result may occur with improper specimen collection / handling, submission of specimen other than nasopharyngeal  swab, presence of viral mutation(s) within the areas targeted by this assay, and inadequate number of viral copies (<250 copies / mL). A negative result must be combined with clinical observations, patient history, and epidemiological information.  Fact Sheet for Patients:   https://www.patel.info/  Fact Sheet for Healthcare Providers: https://hall.com/  This test is not yet approved or  cleared by the [place] FDA and has been authorized for detection and/or diagnosis of SARS-CoV-2 by FDA under an Emergency Use Authorization (EUA).  This EUA will remain in effect (meaning this test can be used) for the duration of the COVID-19 declaration under Section 564(b)(1) of the Act, 21 U.S.C. section 360bbb-3(b)(1), unless the authorization is terminated or revoked sooner.  Performed at [facility], [place] [address] [place], [place] [address]   Blood culture (routine x 2)     Status: None (Preliminary result)   Collection Time: 07/02/22 11:54 PM   Specimen: BLOOD  Result Value Ref Range Status   Specimen Description   Final    BLOOD RIGHT ANTECUBITAL Performed at [place] [address] [place], [place] [address]    Special Requests   Final    BLOOD Blood Culture adequate volume Performed at [place] [address] [place], [place] [address]    Culture   Final    NO GROWTH 2 DAYS Performed at [place] [address], [place], [place] [address]    Report Status  PENDING  Incomplete  Blood culture (routine x 2)     Status: None (Preliminary result)   Collection Time: 07/03/22 12:27 AM   Specimen: BLOOD  Result Value Ref Range Status   Specimen Description   Final    BLOOD RIGHT ANTECUBITAL Performed at [place] [address] [place], [place] [address]    Special Requests   Final    BOTTLES DRAWN AEROBIC AND ANAEROBIC Blood  Culture results may not be optimal due to an excessive volume of blood received in culture bottles Performed at [place] [address], [place], [place] [address]    Culture   Final    NO GROWTH 2 DAYS Performed at [place] [address], [place], [place] [address]    Report Status PENDING  Incomplete    Labs: CBC: Recent Labs  Lab 07/02/22 0758 07/02/22 2354 07/03/22 0511 07/04/22 0621  WBC 6.3 8.3 6.6 6.9  NEUTROABS 4.6 6.8  --   --   HGB 10.1* 10.0* 9.3* 9.8*  HCT 28.5* 29.3* 27.5* 28.8*  MCV 84.3 86.9 88.1 87.0  PLT 209 198 160 974   Basic Metabolic Panel: Recent Labs  Lab 07/02/22 0758 07/02/22 2354 07/03/22 0511 07/04/22 0621 07/05/22 0519  NA 134* 133* 140 137 138  K 3.5 3.7 3.5 3.4* 3.8  CL 97* 99 106 102 104  CO2 '29 24 25 27 26  '$ GLUCOSE 229* 144* 123* 96 115*  BUN '14 15 14 14 17  '$ CREATININE 0.87 0.89 0.91 0.68 0.78  CALCIUM 9.7 8.7* 8.4* 9.1 8.9   Liver Function Tests: Recent Labs  Lab 07/02/22 0758 07/02/22 2354 07/03/22 0511 07/04/22 0621 07/05/22 0519  AST 123* 266* 200* 93* 242*  ALT 154* 260* 225* 175* 280*  ALKPHOS 64 60 56 57 58  BILITOT 0.3 0.4 0.5 0.4 0.5  PROT 7.1 7.0 6.2* 6.7 6.7  ALBUMIN 4.0 3.5 3.2* 3.3* 3.4*   CBG: No results for input(s): "GLUCAP" in the last 168 hours.  Discharge time spent: greater than 30 minutes.  Signed: Oswald Hillock, MD [facility] 07/05/2022

## 2022-07-05 NOTE — TOC CM/SW Note (Signed)
  Transition of Care [facility]) Screening Note   Patient Details  Name: Leah Diaz Date of Birth: 08-20-1961   Transition of Care [facility]) CM/SW Contact:    Ross Ludwig, LCSW Phone Number: 07/05/2022, 12:13 PM    Transition of Care Department [facility]) has reviewed patient and no TOC needs have been identified at this time. We will continue to monitor patient advancement through interdisciplinary progression rounds. If new patient transition needs arise, please place a TOC consult.

## 2022-07-06 ENCOUNTER — Telehealth: Payer: Self-pay

## 2022-07-06 ENCOUNTER — Encounter: Payer: Self-pay | Admitting: Hematology and Oncology

## 2022-07-06 ENCOUNTER — Other Ambulatory Visit: Payer: Self-pay

## 2022-07-06 ENCOUNTER — Inpatient Hospital Stay: Payer: Managed Care, Other (non HMO)

## 2022-07-06 ENCOUNTER — Inpatient Hospital Stay (HOSPITAL_BASED_OUTPATIENT_CLINIC_OR_DEPARTMENT_OTHER): Payer: Managed Care, Other (non HMO) | Admitting: Hematology and Oncology

## 2022-07-06 ENCOUNTER — Other Ambulatory Visit: Payer: Self-pay | Admitting: Hematology and Oncology

## 2022-07-06 DIAGNOSIS — R509 Fever, unspecified: Secondary | ICD-10-CM

## 2022-07-06 DIAGNOSIS — C549 Malignant neoplasm of corpus uteri, unspecified: Secondary | ICD-10-CM | POA: Diagnosis present

## 2022-07-06 DIAGNOSIS — R7989 Other specified abnormal findings of blood chemistry: Secondary | ICD-10-CM

## 2022-07-06 DIAGNOSIS — C786 Secondary malignant neoplasm of retroperitoneum and peritoneum: Secondary | ICD-10-CM | POA: Diagnosis not present

## 2022-07-06 DIAGNOSIS — Z5189 Encounter for other specified aftercare: Secondary | ICD-10-CM | POA: Diagnosis not present

## 2022-07-06 DIAGNOSIS — Z5111 Encounter for antineoplastic chemotherapy: Secondary | ICD-10-CM | POA: Diagnosis present

## 2022-07-06 MED ORDER — PEGFILGRASTIM INJECTION 6 MG/0.6ML ~~LOC~~
6.0000 mg | PREFILLED_SYRINGE | Freq: Once | SUBCUTANEOUS | Status: AC
  Administered 2022-07-06: 6 mg via SUBCUTANEOUS
  Filled 2022-07-06: qty 0.6

## 2022-07-06 NOTE — Telephone Encounter (Signed)
-----   Message from [name], MD sent at 07/06/2022  7:56 AM EDT ----- She missed her inj appt on Saturday due to hospital stay Can you call her? I can see her at 1 pm, no labs and then inj after

## 2022-07-06 NOTE — Telephone Encounter (Signed)
Called and given below message. She is agreeable to appt. Appts scheduled and she is aware of appts.

## 2022-07-06 NOTE — Assessment & Plan Note (Signed)
All cultures are negative Antibiotics were discontinued

## 2022-07-06 NOTE — Patient Instructions (Signed)

## 2022-07-06 NOTE — Progress Notes (Signed)
ON PATHWAY REGIMEN - Uterine  No Change  Continue With Treatment as Ordered.  Original Decision Date/Time: 06/15/2022 08:33     A cycle is every 21 days:     Gemcitabine      Docetaxel      [id number]   **Always confirm dose/schedule in your pharmacy ordering system**  Patient Characteristics: High Grade Undifferentiated/Leiomyosarcoma, Recurrent/Progressive Disease, Medically Inoperable, Second Line, Relapse < 12 Months From Prior Therapy Histology: High Grade Undifferentiated/Leiomyosarcoma Therapeutic Status: Recurrent or Progressive Disease Surgical Status: Medically Inoperable Line of Therapy: Second Line Time to Recurrence: Relapse < 12 Months From Prior Therapy Intent of Therapy: Non-Curative / Palliative Intent, Discussed with Patient

## 2022-07-06 NOTE — Progress Notes (Signed)
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[facility]) I have reviewed documentation from the hospital as well as her test results and culture results I believe her recent fever was caused by transaminitis All cultures are negative She will receive growth factor injection today She will resume cycle 2 of treatment next week  Elevated LFTs This is likely due to recent chemotherapy Observe only I plan to reduce the dose of gemcitabine in the future I recommend the patient to avoid acetaminophen  Fever All cultures are negative Antibiotics were discontinued  No orders of the defined types were placed in this encounter.   All questions were answered. The patient knows to call the clinic with any problems, questions or concerns. The total time spent in the appointment was 30 minutes encounter with patients including review of chart and various tests results, discussions about plan of care and coordination of care plan   [name], MD 07/06/2022 1:31 PM  INTERVAL HISTORY: Please see below for problem oriented charting. she returns for treatment follow-up  She was discharged from the hospital over the weekend I have reviewed all cultures that came back negative Her recent blood work suggested persistent elevated LFTs due to chemotherapy She has no further fever or chills since discharge from the hospital  REVIEW OF SYSTEMS:   Constitutional: Denies fevers, chills or abnormal weight loss Eyes: Denies blurriness of vision Ears, nose, mouth, throat, and face: Denies mucositis or sore throat Respiratory: Denies cough, dyspnea or wheezes Cardiovascular: Denies palpitation, chest discomfort or lower extremity swelling Gastrointestinal:  Denies nausea, heartburn or  change in bowel habits Skin: Denies abnormal skin rashes Lymphatics: Denies new lymphadenopathy or easy bruising Neurological:Denies numbness, tingling or new weaknesses Behavioral/Psych: Mood is stable, no new changes  All other systems were reviewed with the patient and are negative.  I have reviewed the past medical history, past surgical history, social history and family history with the patient and they are unchanged from previous note.  ALLERGIES:  has No Known Allergies.  MEDICATIONS:  Current Outpatient Medications  Medication Sig Dispense Refill   acetaminophen (TYLENOL) 500 MG tablet Take 500 mg by mouth every 6 (six) hours as needed for mild pain, moderate pain or fever.     aspirin 81 MG EC tablet Take by mouth daily.     cetirizine (ZYRTEC) 10 MG tablet Take 10 mg by mouth daily.     Cholecalciferol (VITAMIN D3 PO) Take 1 tablet by mouth daily.     dexamethasone (DECADRON) 4 MG tablet Take 2 tabs the day before Taxotere. Then daily after chemo for 2 days. 30 tablet 1   losartan-hydrochlorothiazide (HYZAAR) 100-25 MG tablet Take 1 tablet by mouth daily.     omeprazole (PRILOSEC) 20 MG capsule Take 1 capsule (20 mg total) by mouth 2 (two) times daily. 28 capsule 0   No current facility-administered medications for this visit.    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 No extra uterine extension. No  evidence of metastatic disease within the pelvis.   Mild thickening of the endometrial stripe, likely related to entrapment by the adjacent uterine mass   06/03/2021 Initial Diagnosis   Uterine  sarcoma ([place])   06/03/2021 Cancer Staging   Staging form: Corpus Uteri - Sarcoma, AJCC 7th Edition - Clinical stage from 06/03/2021: FIGO Stage IVB (rT1b, N0, M1) - Signed by [name], MD on 03/31/2022 Diagnostic confirmation: Positive histology Stage prefix: Recurrence Biopsy of metastatic site performed: No Lymph-vascular invasion (LVI): LVI present/identified, NOS   06/03/2021 Pathology Results   FINAL MICROSCOPIC DIAGNOSIS:   A. UTERUS, CERVIX, BILATERAL FALLOPIAN TUBES AND OVARIES, HYSTERECTOMY:  - Uterus:       Mixed high grade uterine sarcoma, spanning 6 cm, see comment.       Extensive lymphovascular invasion.       See oncology table.  - Cervix: Benign squamous and endocervical mucosa. No dysplasia or  malignancy.  - Bilateral ovaries: Unremarkable. No malignancy.  - Bilateral fallopian tubes: Unremarkable. No malignancy.   ONCOLOGY TABLE:   UTERUS, SARCOMA: Resection   Procedure: Total hysterectomy and bilateral salpingo-oophorectomy  Specimen Integrity: Focally disrupted on posterior surface  Tumor Site: Anterior wall  Tumor Size: 6 cm  Histologic Type: High grade sarcoma, mixed. See comment.  Other Tissue/ Organ Involvement: Not identified  Lymphovascular Invasion: Present, extensive.  Margins: All margins negative for tumor  Regional Lymph Nodes: Not applicable (no lymph nodes submitted or found)  Distant Metastasis:       Distant Site(s) Involved: Not applicable  Pathologic Stage Classification (pTNM, AJCC 8th Edition): pT1b, pN not  assigned  Ancillary Studies: Can be performed upon request  Representative Tumor Block: A6  Comment(s): The tumor has two morphologically different components. One component consists of malignant spindle cells which can be seen arising from the smooth muscle. These foci are positive for SMA, desmin, cyclinD1 (focal), and CD10 (patchy). The other component consists of round to slightly spindle cells with abundant admixed vessels and  occasional large pleomorphic cells. These areas are positive for SMA (patchy weak), desmin (focal), CD10 (variable with diffuse areas). Pancytokeratin is negative. 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   ADDENDUM:   PROGNOSTIC INDICATOR RESULTS:   Immunohistochemical and morphometric analysis performed manually    Estrogen Receptor:       POSITIVE, 50%, WEAK TO MODERATE STAINING  Progesterone Receptor:   POSITIVE, 50%, MODERATE TO STRONG STAINING   Reference Range Estrogen and Progesterone Receptor       Negative  0%       Positive  >1%    06/03/2021 Surgery   Surgeon: Donaciano Eva    Assistants: Dr Lahoma Crocker (an MD assistant was necessary for tissue manipulation, management of robotic instrumentation, retraction and positioning due to the complexity of the case and hospital policies).  Operation: Robotic-assisted laparoscopic total hysterectomy >250gm with bilateral salpingoophorectomy, minilaparotomy for specimen delivery   Surgeon: Donaciano Eva    Operative Findings:  : Bulky 16cm uterus with intra-uterine mass (consistent with a fibroid-like mass) , smooth normal appearing serosa, no suspicious bulky nodes. Normal ovaries bilaterally. Normal upper abdomen. Uterus too large to deliver vaginally in tact.    06/18/2021 Imaging   1. Multiple bilateral pulmonary nodules, measuring up to 9 mm. Most of these are perifissural and subpleural in location, likely lymph nodes. Nevertheless, close follow-up recommended to exclude metastatic disease. 2. 11 mm hypoattenuating lesion towards the dome  of the liver, indeterminate. MRI abdomen with and without contrast could be used to further evaluate as clinically warranted. 3. Aortic Atherosclerosis (ICD10-I70.0).   10/13/2021 Imaging   1. No acute findings within the abdomen or pelvis. No specific findings identified to suggest residual or  recurrence of tumor. 2. Stable small pulmonary nodules within the left lower lobe.   03/18/2022 Imaging   1. Interval development of 2 soft tissue nodules in the anterior pelvis measuring 10 mm and 7 mm respectively. Close follow-up recommended to exclude metastatic disease. PET-CT may be warranted to further evaluate. 2. Development of 2 mm left upper lobe parenchymal nodule, nonspecific. Close attention on follow-up imaging recommended  3. Stable appearance of index bilateral pulmonary nodules identified previously. 4. Aortic Atherosclerosis (ICD10-I70.0).   04/02/2022 PET scan   1. Signs of local tumor recurrence noted at the vaginal cuff. 2. 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 New since 10/13/2021 and progressive when compared with 03/17/2022. 3. New focal area of increased uptake within the right biceps femoris muscle which is suspicious for skeletal muscle involvement. 4. Stable appearance of small pulmonary nodules described on 03/17/2022. These are too small to characterize by PET-CT.   04/09/2022 Procedure   Successful placement of a right IJ approach Power Port with ultrasound and fluoroscopic guidance. The catheter is ready for use.   04/09/2022 Echocardiogram    1. Left ventricular ejection fraction, by estimation, is 65 to 70%. The left ventricle has normal function. The left ventricle has no regional wall motion abnormalities. There is mild concentric left ventricular hypertrophy. Left ventricular diastolic parameters are consistent with Grade I diastolic dysfunction (impaired relaxation).  2. Right ventricular systolic function is normal. The right ventricular size is normal.  3. The mitral valve is normal in structure. No evidence of mitral valve regurgitation.  4. The aortic valve is tricuspid. Aortic valve regurgitation is not visualized. No aortic stenosis is present.  5. The inferior vena cava is normal in size with greater than  50% respiratory variability, suggesting right atrial pressure of 3 mmHg.   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 Status post hysterectomy and bilateral oophorectomy. Multiple newand enlarged peritoneal nodules throughout the low abdomen and pelvis, previously FDG avid and consistent with worsened locally recurrent and peritoneal metastatic disease. 2. Multiple small bilateral pulmonary nodules, several of which are slightly enlarged compared to prior examination, consistent with worsened pulmonary metastatic disease.   Aortic Atherosclerosis (ICD10-I70.0).     06/25/2022 -  Chemotherapy   Patient is on Treatment Plan : UTERINE UNDIFFERENTIATED / LEIOMYOSARCOMA Gemcitabine D1,8 + Docetaxel D8 (900/100) q21d       PHYSICAL EXAMINATION: ECOG PERFORMANCE STATUS: 1 - Symptomatic but completely ambulatory  Vitals:   07/06/22 1259  BP: 109/62  Pulse: (!) 112  Resp: 18  Temp: 98.8 F (37.1 C)  SpO2: 100%   Filed Weights   07/06/22 1259  Weight: 216 lb 3.2 oz (98.1 kg)    GENERAL:alert, no distress and comfortable NEURO: alert & oriented x 3 with fluent speech, no focal motor/sensory deficits  LABORATORY DATA:  I have reviewed the data as listed    Component Value Date/Time   NA 138 07/05/2022 0519   K 3.8 07/05/2022 0519   CL 104 07/05/2022 0519   CO2 26 07/05/2022 0519   GLUCOSE 115 (H) 07/05/2022 0519   BUN  17 07/05/2022 0519   CREATININE 0.78 07/05/2022 0519   CREATININE 0.87 07/02/2022 0758   CALCIUM 8.9 07/05/2022 0519   PROT 6.7 07/05/2022 0519   ALBUMIN 3.4 (L) 07/05/2022 0519   AST 242 (H) 07/05/2022 0519   AST 123 (H) 07/02/2022 0758   ALT 280 (H) 07/05/2022 0519   ALT 154 (H) 07/02/2022 0758   ALKPHOS 58 07/05/2022 0519   BILITOT 0.5 07/05/2022 0519   BILITOT 0.3 07/02/2022 0758   GFRNONAA >60 07/05/2022 0519   GFRNONAA >60 07/02/2022 0758    No  results found for: "SPEP", "UPEP"  Lab Results  Component Value Date   WBC 6.9 07/04/2022   NEUTROABS 6.8 07/02/2022   HGB 9.8 (L) 07/04/2022   HCT 28.8 (L) 07/04/2022   MCV 87.0 07/04/2022   PLT 155 07/04/2022      Chemistry      Component Value Date/Time   NA 138 07/05/2022 0519   K 3.8 07/05/2022 0519   CL 104 07/05/2022 0519   CO2 26 07/05/2022 0519   BUN 17 07/05/2022 0519   CREATININE 0.78 07/05/2022 0519   CREATININE 0.87 07/02/2022 0758      Component Value Date/Time   CALCIUM 8.9 07/05/2022 0519   ALKPHOS 58 07/05/2022 0519   AST 242 (H) 07/05/2022 0519   AST 123 (H) 07/02/2022 0758   ALT 280 (H) 07/05/2022 0519   ALT 154 (H) 07/02/2022 0758   BILITOT 0.5 07/05/2022 0519   BILITOT 0.3 07/02/2022 0758       RADIOGRAPHIC STUDIES: I have personally reviewed the radiological images as listed and agreed with the findings in the report. DG Chest 2 View  Result Date: 07/02/2022 CLINICAL DATA:  History of uterine cancer with fevers, initial encounter EXAM: CHEST - 2 VIEW COMPARISON:  06/10/2022 FINDINGS: Cardiac shadow is within normal limits. Right chest wall port is again seen and stable. No parenchymal nodules in the lungs are not well appreciated likely related to their small size. No bony abnormality is seen. IMPRESSION: No acute abnormality noted. Known pulmonary nodules are not well appreciated on this exam likely related to size. Electronically Signed   By: Inez Catalina M.D.   On: 07/02/2022 23:05   CT CHEST ABDOMEN PELVIS W CONTRAST  Result Date: 06/10/2022 CLINICAL DATA:  Uterine sarcoma, local recurrence and peritoneal carcinomatosis, pulmonary nodules suspicious for pulmonary metastases, assess treatment response, chemotherapy in progress, status post hysterectomy * Tracking Code: BO * EXAM: CT CHEST, ABDOMEN, AND PELVIS WITH CONTRAST TECHNIQUE: Multidetector CT imaging of the chest, abdomen and pelvis was performed following the standard protocol during bolus  administration of intravenous contrast. RADIATION DOSE REDUCTION: This exam was performed according to the departmental dose-optimization program which includes automated exposure control, adjustment of the mA and/or kV according to patient size and/or use of iterative reconstruction technique. CONTRAST:  118m OMNIPAQUE IOHEXOL 300 MG/ML SOLN additional oral enteric contrast COMPARISON:  PET-CT, 04/02/2022, CT chest abdomen pelvis, 03/17/2022 FINDINGS: CT CHEST FINDINGS Cardiovascular: Right chest port catheter. Normal heart size. No pericardial effusion. Mediastinum/Nodes: No enlarged mediastinal, hilar, or axillary lymph nodes. Thyroid gland, trachea, and esophagus demonstrate no significant findings. Lungs/Pleura: Multiple small bilateral pulmonary nodules, several of which are slightly enlarged compared to prior examination, for example a 0.3 cm nodule of the lateral segment right middle lobe, previously no greater than 0.1 cm (series 4, image 74), and a 0.4 cm nodule of the posterior lingula, previously no greater than 0.2 cm (series 4, image 69). Largest nodule  at the dependent right lung base measures 1.0 x 0.6 cm, previously 0.9 x 0.5 cm (series 4, image 74). No pleural effusion or pneumothorax. Musculoskeletal: No chest wall mass or suspicious osseous lesions identified. CT ABDOMEN PELVIS FINDINGS Hepatobiliary: No solid liver abnormality is seen. Small fluid attenuation cyst or hemangioma the central liver dome (series 2, image 46). No gallstones, gallbladder wall thickening, or biliary dilatation. Pancreas: Unremarkable. No pancreatic ductal dilatation or surrounding inflammatory changes. Spleen: Normal in size without significant abnormality. Adrenals/Urinary Tract: Adrenal glands are unremarkable. Kidneys are normal, without renal calculi, solid lesion, or hydronephrosis. Bladder is unremarkable. Stomach/Bowel: Stomach is within normal limits. Appendix appears normal. No evidence of bowel wall  thickening, distention, or inflammatory changes. Vascular/Lymphatic: Scattered aortic atherosclerosis. No enlarged abdominal or pelvic lymph nodes. Reproductive: Status post hysterectomy and bilateral oophorectomy. Other: No abdominal wall hernia or abnormality. No ascites. Multiple new and enlarged peritoneal nodules throughout the low abdomen and pelvis, nodule of the low midline ventral abdomen measuring 4.4 x 3.4 cm, previously 2.1 x 1.6 cm (series 2, image 105), dominant nodule of the vaginal cuff measuring 5.9 x 2.9 cm, previously 4.6 x 1.5 cm (series 2, image 114), and small nodule of the left lower quadrant measuring 1.1 x 0.8 cm, previously 0.4 cm (series 2, image 100). Musculoskeletal: No acute osseous findings. IMPRESSION: 1. Status post hysterectomy and bilateral oophorectomy. Multiple new and enlarged peritoneal nodules throughout the low abdomen and pelvis, previously FDG avid and consistent with worsened locally recurrent and peritoneal metastatic disease. 2. Multiple small bilateral pulmonary nodules, several of which are slightly enlarged compared to prior examination, consistent with worsened pulmonary metastatic disease. Aortic Atherosclerosis (ICD10-I70.0). Electronically Signed   By: Delanna Ahmadi M.D.   On: 06/10/2022 12:41

## 2022-07-06 NOTE — Assessment & Plan Note (Signed)
I have reviewed documentation from the hospital as well as her test results and culture results I believe her recent fever was caused by transaminitis All cultures are negative She will receive growth factor injection today She will resume cycle 2 of treatment next week

## 2022-07-06 NOTE — Assessment & Plan Note (Signed)
This is likely due to recent chemotherapy Observe only I plan to reduce the dose of gemcitabine in the future I recommend the patient to avoid acetaminophen

## 2022-07-08 LAB — CULTURE, BLOOD (ROUTINE X 2)
Culture: NO GROWTH
Culture: NO GROWTH
Special Requests: ADEQUATE

## 2022-07-09 ENCOUNTER — Encounter: Payer: Self-pay | Admitting: Hematology and Oncology

## 2022-07-09 ENCOUNTER — Other Ambulatory Visit: Payer: Self-pay

## 2022-07-09 ENCOUNTER — Telehealth: Payer: Self-pay

## 2022-07-09 ENCOUNTER — Other Ambulatory Visit (HOSPITAL_COMMUNITY): Payer: Self-pay

## 2022-07-09 MED ORDER — MAGIC MOUTHWASH W/LIDOCAINE: 5.0000 mL | Freq: Four times a day (QID) | ORAL | 0 refills | Status: DC

## 2022-07-09 MED ORDER — NYSTATIN 100000 UNIT/ML MT SUSP
OROMUCOSAL | 0 refills | Status: DC
  Filled 2022-07-09: qty 180, 7d supply, fill #0

## 2022-07-09 NOTE — Telephone Encounter (Signed)
She called and left a message. Her mouth sores are worse. She has been using baking soda and salt rinses that is helping a little.  She is asking for Rx sent to pharmacy if possible.

## 2022-07-09 NOTE — Telephone Encounter (Signed)
Do we have MMW with lidocaine? If so, she needs to come to [facility] to pick that up 4x per day, swish and spit for 7 days no refills

## 2022-07-09 NOTE — Telephone Encounter (Signed)
Rx sent to pharmacy. They have lidocaine available. Called [name] and told Rx sent and pharmacy will not mix the Rx until she is in the pharmacy. She verbalized understanding.

## 2022-07-16 ENCOUNTER — Inpatient Hospital Stay: Payer: Managed Care, Other (non HMO)

## 2022-07-16 ENCOUNTER — Encounter: Payer: Self-pay | Admitting: Hematology and Oncology

## 2022-07-16 ENCOUNTER — Inpatient Hospital Stay: Payer: Managed Care, Other (non HMO) | Admitting: Hematology and Oncology

## 2022-07-16 VITALS — BP 131/73 | HR 91 | Resp 17

## 2022-07-16 DIAGNOSIS — C549 Malignant neoplasm of corpus uteri, unspecified: Secondary | ICD-10-CM

## 2022-07-16 DIAGNOSIS — D6481 Anemia due to antineoplastic chemotherapy: Secondary | ICD-10-CM

## 2022-07-16 DIAGNOSIS — Z5111 Encounter for antineoplastic chemotherapy: Secondary | ICD-10-CM | POA: Diagnosis not present

## 2022-07-16 DIAGNOSIS — K1231 Oral mucositis (ulcerative) due to antineoplastic therapy: Secondary | ICD-10-CM | POA: Diagnosis not present

## 2022-07-16 DIAGNOSIS — R7989 Other specified abnormal findings of blood chemistry: Secondary | ICD-10-CM

## 2022-07-16 DIAGNOSIS — T451X5A Adverse effect of antineoplastic and immunosuppressive drugs, initial encounter: Secondary | ICD-10-CM

## 2022-07-16 DIAGNOSIS — N179 Acute kidney failure, unspecified: Secondary | ICD-10-CM

## 2022-07-16 LAB — CBC WITH DIFFERENTIAL (CANCER CENTER ONLY)
Abs Immature Granulocytes: 6.18 10*3/uL — ABNORMAL HIGH (ref 0.00–0.07)
Basophils Absolute: 0.1 10*3/uL (ref 0.0–0.1)
Basophils Relative: 0 %
Eosinophils Absolute: 0.4 10*3/uL (ref 0.0–0.5)
Eosinophils Relative: 1 %
HCT: 27.8 % — ABNORMAL LOW (ref 36.0–46.0)
Hemoglobin: 9.6 g/dL — ABNORMAL LOW (ref 12.0–15.0)
Immature Granulocytes: 23 %
Lymphocytes Relative: 9 %
Lymphs Abs: 2.5 10*3/uL (ref 0.7–4.0)
MCH: 30.3 pg (ref 26.0–34.0)
MCHC: 34.5 g/dL (ref 30.0–36.0)
MCV: 87.7 fL (ref 80.0–100.0)
Monocytes Absolute: 3.3 10*3/uL — ABNORMAL HIGH (ref 0.1–1.0)
Monocytes Relative: 12 %
Neutro Abs: 14.9 10*3/uL — ABNORMAL HIGH (ref 1.7–7.7)
Neutrophils Relative %: 55 %
Platelet Count: 255 10*3/uL (ref 150–400)
RBC: 3.17 MIL/uL — ABNORMAL LOW (ref 3.87–5.11)
RDW: 17.8 % — ABNORMAL HIGH (ref 11.5–15.5)
WBC Count: 27.4 10*3/uL — ABNORMAL HIGH (ref 4.0–10.5)
nRBC: 0.9 % — ABNORMAL HIGH (ref 0.0–0.2)

## 2022-07-16 LAB — CMP (CANCER CENTER ONLY)
ALT: 37 U/L (ref 0–44)
AST: 28 U/L (ref 15–41)
Albumin: 3.6 g/dL (ref 3.5–5.0)
Alkaline Phosphatase: 106 U/L (ref 38–126)
Anion gap: 8 (ref 5–15)
BUN: 17 mg/dL (ref 8–23)
CO2: 27 mmol/L (ref 22–32)
Calcium: 8.6 mg/dL — ABNORMAL LOW (ref 8.9–10.3)
Chloride: 102 mmol/L (ref 98–111)
Creatinine: 1.55 mg/dL — ABNORMAL HIGH (ref 0.44–1.00)
GFR, Estimated: 37 mL/min — ABNORMAL LOW (ref >60)
Glucose, Bld: 199 mg/dL — ABNORMAL HIGH (ref 70–99)
Potassium: 3.6 mmol/L (ref 3.5–5.1)
Sodium: 137 mmol/L (ref 135–145)
Total Bilirubin: 0.3 mg/dL (ref 0.3–1.2)
Total Protein: 6.4 g/dL — ABNORMAL LOW (ref 6.5–8.1)

## 2022-07-16 MED ORDER — HEPARIN SOD (PORK) LOCK FLUSH 100 UNIT/ML IV SOLN
500.0000 [IU] | Freq: Once | INTRAVENOUS | Status: AC | PRN
  Administered 2022-07-16: 500 [IU]

## 2022-07-16 MED ORDER — SODIUM CHLORIDE 0.9 % IV SOLN: Freq: Once | INTRAVENOUS | Status: AC

## 2022-07-16 MED ORDER — SODIUM CHLORIDE 0.9% FLUSH
10.0000 mL | INTRAVENOUS | Status: DC | PRN
  Administered 2022-07-16: 10 mL

## 2022-07-16 MED ORDER — SODIUM CHLORIDE 0.9 % IV SOLN
720.0000 mg/m2 | Freq: Once | INTRAVENOUS | Status: AC
  Administered 2022-07-16: 1558 mg via INTRAVENOUS
  Filled 2022-07-16: qty 40.98

## 2022-07-16 MED ORDER — LIDOCAINE-PRILOCAINE 2.5-2.5 % EX CREA: 1.0000 | TOPICAL_CREAM | CUTANEOUS | 3 refills | Status: DC | PRN

## 2022-07-16 MED ORDER — SODIUM CHLORIDE 0.9% FLUSH
10.0000 mL | Freq: Once | INTRAVENOUS | Status: AC
  Administered 2022-07-16: 10 mL

## 2022-07-16 MED ORDER — PROCHLORPERAZINE MALEATE 10 MG PO TABS
10.0000 mg | ORAL_TABLET | Freq: Once | ORAL | Status: AC
  Administered 2022-07-16: 10 mg via ORAL
  Filled 2022-07-16: qty 1

## 2022-07-16 NOTE — Assessment & Plan Note (Signed)
I believe her recent fever was caused by transaminitis All cultures are negative We will resume treatment with dose reduction of gemcitabine I recommend minimum 3 cycles of treatment before repeating CT imaging

## 2022-07-16 NOTE — Assessment & Plan Note (Signed)
This is improved We will proceed with dose reduction as discussed

## 2022-07-16 NOTE — Progress Notes (Signed)
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 The patient denies recent history of bleeding such as epistaxis, hematuria or hematochezia. She is asymptomatic from the anemia. I will observe for now.  We will proceed with dose reduction as discussed  Mucositis due to antineoplastic therapy This is resolving She has Magic mouthwash with lidocaine to use as needed  Elevated LFTs This is improved We will proceed with dose reduction as discussed  Acute renal failure (ARF) ([place]) She has slight acute renal failure likely due to dehydration and diuretic therapy I recommend holding off Hyzaar until repeat labs next week The patient is instructed to drink additional oral fluids as tolerated  No orders of the defined types were placed in this encounter.   All questions were answered. The patient knows to call the clinic with any problems, questions or concerns. The total time spent in the appointment was 30 minutes encounter with patients including review of chart and various tests results, discussions about plan of care and coordination of care plan   [name], MD 07/16/2022 8:43 AM  INTERVAL HISTORY: Please see below for problem oriented charting. she returns for treatment follow-up with her husband, seen prior to cycle 2 She has no further fevers Her appetite is good She has some fatigue but otherwise doing well  REVIEW OF SYSTEMS:   Constitutional: Denies  fevers, chills or abnormal weight loss Eyes: Denies blurriness of vision Ears, nose, mouth, throat, and face: Denies mucositis or sore throat Respiratory: Denies cough, dyspnea or wheezes Cardiovascular: Denies palpitation, chest discomfort or lower extremity swelling Gastrointestinal:  Denies nausea, heartburn or change in bowel habits Skin: Denies abnormal skin rashes Lymphatics: Denies new lymphadenopathy or easy bruising Neurological:Denies numbness, tingling or new weaknesses Behavioral/Psych: Mood is stable, no new changes  All other systems were reviewed with the patient and are negative.  I have reviewed the past medical history, past surgical history, social history and family history with the patient and they are unchanged from previous note.  ALLERGIES:  has No Known Allergies.  MEDICATIONS:  Current Outpatient Medications  Medication Sig Dispense Refill   lidocaine-prilocaine (EMLA) cream Apply 1 Application topically as needed. 30 g 3   losartan-hydrochlorothiazide (HYZAAR) 100-25 MG tablet Take 1 tablet by mouth daily. HOLD 8/31 per Dr. [name]     acetaminophen (TYLENOL) 500 MG tablet Take 500 mg by mouth every 6 (six) hours as needed for mild pain, moderate pain or fever.     aspirin 81 MG EC tablet Take by mouth daily.     cetirizine (ZYRTEC) 10 MG tablet Take 10 mg by mouth daily.     Cholecalciferol (VITAMIN D3 PO) Take 1 tablet by mouth daily.     magic mouthwash (nystatin, lidocaine, diphenhydrAMINE, alum & mag hydroxide) suspension Swish and spit with 5 MLs by mouth 4 times a day for 7 days 180 mL 0   omeprazole (PRILOSEC) 20 MG capsule Take 1 capsule (20 mg total) by mouth 2 (two) times daily. 28 capsule 0  No current facility-administered medications for this visit.    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 No extra uterine extension. No evidence of metastatic disease within the pelvis.   Mild thickening of the endometrial stripe, likely related to entrapment by the adjacent uterine mass   06/03/2021 Initial Diagnosis   Uterine sarcoma ([place])   06/03/2021 Cancer Staging   Staging form: Corpus Uteri - Sarcoma, AJCC 7th Edition - Clinical stage from 06/03/2021: FIGO Stage IVB (rT1b, N0, M1) - Signed by [name], MD on 03/31/2022 Diagnostic confirmation: Positive histology Stage prefix: Recurrence Biopsy of metastatic site performed: No Lymph-vascular invasion (LVI): LVI present/identified, NOS   06/03/2021 Pathology Results   FINAL MICROSCOPIC DIAGNOSIS:   A. UTERUS, CERVIX, BILATERAL FALLOPIAN TUBES AND OVARIES, HYSTERECTOMY:  - Uterus:       Mixed high grade uterine sarcoma, spanning 6 cm, see comment.       Extensive lymphovascular invasion.       See oncology table.  - Cervix: Benign squamous and endocervical mucosa. No dysplasia or  malignancy.  - Bilateral ovaries: Unremarkable. No malignancy.  - Bilateral fallopian tubes: Unremarkable. No malignancy.   ONCOLOGY TABLE:   UTERUS, SARCOMA: Resection   Procedure: Total hysterectomy and bilateral salpingo-oophorectomy  Specimen Integrity: Focally disrupted on posterior surface  Tumor Site: Anterior wall  Tumor Size: 6 cm  Histologic Type: High grade sarcoma, mixed. See comment.  Other Tissue/ Organ Involvement: Not identified  Lymphovascular Invasion: Present, extensive.  Margins: All margins negative for tumor  Regional Lymph Nodes: Not applicable (no lymph nodes submitted or found)  Distant Metastasis:       Distant Site(s) Involved: Not applicable  Pathologic Stage Classification  (pTNM, AJCC 8th Edition): pT1b, pN not  assigned  Ancillary Studies: Can be performed upon request  Representative Tumor Block: A6  Comment(s): The tumor has two morphologically different components. One component consists of malignant spindle cells which can be seen arising from the smooth muscle. These foci are positive for SMA, desmin, cyclinD1 (focal), and CD10 (patchy). The other component consists of round to slightly spindle cells with abundant admixed vessels and occasional large pleomorphic cells. These areas are positive for SMA (patchy weak), desmin (focal), CD10 (variable with diffuse areas). Pancytokeratin is negative. 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   ADDENDUM:   PROGNOSTIC INDICATOR RESULTS:   Immunohistochemical and morphometric analysis performed manually    Estrogen Receptor:       POSITIVE, 50%, WEAK TO MODERATE STAINING  Progesterone Receptor:   POSITIVE, 50%, MODERATE TO STRONG STAINING   Reference Range Estrogen and Progesterone Receptor       Negative  0%       Positive  >1%    06/03/2021 Surgery   Surgeon: Donaciano Eva    Assistants: Dr Lahoma Crocker (an MD assistant was necessary for tissue manipulation, management of robotic instrumentation, retraction and positioning due to the complexity of the case and hospital policies).  Operation: Robotic-assisted laparoscopic total hysterectomy >250gm with bilateral salpingoophorectomy, minilaparotomy for specimen delivery   Surgeon: Donaciano Eva    Operative Findings:  : Bulky 16cm uterus with  intra-uterine mass (consistent with a fibroid-like mass) , smooth normal appearing serosa, no suspicious bulky nodes. Normal ovaries bilaterally. Normal upper abdomen. Uterus too large to deliver vaginally in tact.    06/18/2021 Imaging   1. Multiple bilateral pulmonary nodules, measuring up to 9 mm. Most of these are perifissural  and subpleural in location, likely lymph nodes. Nevertheless, close follow-up recommended to exclude metastatic disease. 2. 11 mm hypoattenuating lesion towards the dome of the liver, indeterminate. MRI abdomen with and without contrast could be used to further evaluate as clinically warranted. 3. Aortic Atherosclerosis (ICD10-I70.0).   10/13/2021 Imaging   1. No acute findings within the abdomen or pelvis. No specific findings identified to suggest residual or recurrence of tumor. 2. Stable small pulmonary nodules within the left lower lobe.   03/18/2022 Imaging   1. Interval development of 2 soft tissue nodules in the anterior pelvis measuring 10 mm and 7 mm respectively. Close follow-up recommended to exclude metastatic disease. PET-CT may be warranted to further evaluate. 2. Development of 2 mm left upper lobe parenchymal nodule, nonspecific. Close attention on follow-up imaging recommended  3. Stable appearance of index bilateral pulmonary nodules identified previously. 4. Aortic Atherosclerosis (ICD10-I70.0).   04/02/2022 PET scan   1. Signs of local tumor recurrence noted at the vaginal cuff. 2. 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 New since 10/13/2021 and progressive when compared with 03/17/2022. 3. New focal area of increased uptake within the right biceps femoris muscle which is suspicious for skeletal muscle involvement. 4. Stable appearance of small pulmonary nodules described on 03/17/2022. These are too small to characterize by PET-CT.   04/09/2022 Procedure   Successful placement of a right IJ approach Power Port with ultrasound and fluoroscopic guidance. The catheter is ready for use.   04/09/2022 Echocardiogram    1. Left ventricular ejection fraction, by estimation, is 65 to 70%. The left ventricle has normal function. The left ventricle has no regional wall motion abnormalities. There is mild concentric left ventricular  hypertrophy. Left ventricular diastolic parameters are consistent with Grade I diastolic dysfunction (impaired relaxation).  2. Right ventricular systolic function is normal. The right ventricular size is normal.  3. The mitral valve is normal in structure. No evidence of mitral valve regurgitation.  4. The aortic valve is tricuspid. Aortic valve regurgitation is not visualized. No aortic stenosis is present.  5. The inferior vena cava is normal in size with greater than 50% respiratory variability, suggesting right atrial pressure of 3 mmHg.   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 Status post hysterectomy and bilateral oophorectomy. Multiple newand enlarged peritoneal nodules throughout the low abdomen and pelvis, previously FDG avid and consistent with worsened locally recurrent and peritoneal metastatic disease. 2. Multiple small bilateral pulmonary nodules, several of which are slightly enlarged compared to prior examination, consistent with worsened pulmonary metastatic disease.   Aortic Atherosclerosis (ICD10-I70.0).     06/25/2022 - 07/06/2022 Chemotherapy   Patient is on Treatment Plan : UTERINE UNDIFFERENTIATED / LEIOMYOSARCOMA Gemcitabine D1,8 + Docetaxel D8 (900/100) q21d     06/25/2022 -  Chemotherapy   Patient is on Treatment Plan : UTERINE UNDIFFERENTIATED LEIOMYOSARCOMA Gemcitabine D1,8 + Docetaxel D8 (900/100) q21d       PHYSICAL EXAMINATION: ECOG PERFORMANCE STATUS: 1 - Symptomatic but completely ambulatory  Vitals:   07/16/22 0808  BP: 137/74  Pulse: Marland Kitchen)  107  Resp: 18  Temp: 98.4 F (36.9 C)  SpO2: 99%   Filed Weights   07/16/22 0808  Weight: 216 lb 6.4 oz (98.2 kg)    GENERAL:alert, no distress and comfortable NEURO: alert & oriented x 3 with fluent speech, no focal motor/sensory deficits  LABORATORY DATA:  I have reviewed the data as listed     Component Value Date/Time   NA 137 07/16/2022 0747   K 3.6 07/16/2022 0747   CL 102 07/16/2022 0747   CO2 27 07/16/2022 0747   GLUCOSE 199 (H) 07/16/2022 0747   BUN 17 07/16/2022 0747   CREATININE 1.55 (H) 07/16/2022 0747   CALCIUM 8.6 (L) 07/16/2022 0747   PROT 6.4 (L) 07/16/2022 0747   ALBUMIN 3.6 07/16/2022 0747   AST 28 07/16/2022 0747   ALT 37 07/16/2022 0747   ALKPHOS 106 07/16/2022 0747   BILITOT 0.3 07/16/2022 0747   GFRNONAA 37 (L) 07/16/2022 0747    No results found for: "SPEP", "UPEP"  Lab Results  Component Value Date   WBC 27.4 (H) 07/16/2022   NEUTROABS PENDING 07/16/2022   HGB 9.6 (L) 07/16/2022   HCT 27.8 (L) 07/16/2022   MCV 87.7 07/16/2022   PLT 255 07/16/2022      Chemistry      Component Value Date/Time   NA 137 07/16/2022 0747   K 3.6 07/16/2022 0747   CL 102 07/16/2022 0747   CO2 27 07/16/2022 0747   BUN 17 07/16/2022 0747   CREATININE 1.55 (H) 07/16/2022 0747      Component Value Date/Time   CALCIUM 8.6 (L) 07/16/2022 0747   ALKPHOS 106 07/16/2022 0747   AST 28 07/16/2022 0747   ALT 37 07/16/2022 0747   BILITOT 0.3 07/16/2022 0747

## 2022-07-16 NOTE — Patient Instructions (Signed)
[facility] MEDICAL ONCOLOGY  Discharge Instructions: Thank you for choosing [facility] to provide your oncology and hematology care.   If you have a lab appointment with the Cancer Center, please go directly to the Cancer Center and check in at the registration area.   Wear comfortable clothing and clothing appropriate for easy access to any Portacath or PICC line.   We strive to give you quality time with your provider. You may need to reschedule your appointment if you arrive late (15 or more minutes).  Arriving late affects you and other patients whose appointments are after yours.  Also, if you miss three or more appointments without notifying the office, you may be dismissed from the clinic at the provider's discretion.      For prescription refill requests, have your pharmacy contact our office and allow 72 hours for refills to be completed.    Today you received the following chemotherapy and/or immunotherapy agents: Gemzar      To help prevent nausea and vomiting after your treatment, we encourage you to take your nausea medication as directed.  BELOW ARE SYMPTOMS THAT SHOULD BE REPORTED IMMEDIATELY: *FEVER GREATER THAN 100.4 F (38 C) OR HIGHER *CHILLS OR SWEATING *NAUSEA AND VOMITING THAT IS NOT CONTROLLED WITH YOUR NAUSEA MEDICATION *UNUSUAL SHORTNESS OF BREATH *UNUSUAL BRUISING OR BLEEDING *URINARY PROBLEMS (pain or burning when urinating, or frequent urination) *BOWEL PROBLEMS (unusual diarrhea, constipation, pain near the anus) TENDERNESS IN MOUTH AND THROAT WITH OR WITHOUT PRESENCE OF ULCERS (sore throat, sores in mouth, or a toothache) UNUSUAL RASH, SWELLING OR PAIN  UNUSUAL VAGINAL DISCHARGE OR ITCHING   Items with * indicate a potential emergency and should be followed up as soon as possible or go to the Emergency Department if any problems should occur.  Please show the CHEMOTHERAPY ALERT CARD or IMMUNOTHERAPY ALERT CARD at check-in to the  Emergency Department and triage nurse.  Should you have questions after your visit or need to cancel or reschedule your appointment, please contact [facility]ICAL [facility]COLOGY  Dept: [phone]  and follow the prompts.  Office hours are 8:00 a.m. to 4:30 p.m. Monday - Friday. Please note that voicemails left after 4:00 p.m. may not be returned until the following business day.  We are closed weekends and major holidays. You have access to a nurse at all times for urgent questions. Please call the main number to the clinic Dept: [phone] and follow the prompts.   For any non-urgent questions, you may also contact your provider using MyChart. We now offer e-Visits for anyone 18 and older to request care online for non-urgent symptoms. For details visit [email or web address].   Also download the MyChart app! Go to the app store, search "MyChart", open the app, select [facility], and log in with your MyChart username and password.  Masks are optional in the cancer centers. If you would like for your care team to wear a mask while they are taking care of you, please let them know. You may have one support person who is at least 63 years old accompany you for your appointments. 

## 2022-07-16 NOTE — Assessment & Plan Note (Signed)
This is likely due to recent treatment. The patient denies recent history of bleeding such as epistaxis, hematuria or hematochezia. She is asymptomatic from the anemia. I will observe for now.  We will proceed with dose reduction as discussed

## 2022-07-16 NOTE — Assessment & Plan Note (Signed)
This is resolving She has Magic mouthwash with lidocaine to use as needed

## 2022-07-16 NOTE — Assessment & Plan Note (Signed)
She has slight acute renal failure likely due to dehydration and diuretic therapy I recommend holding off Hyzaar until repeat labs next week The patient is instructed to drink additional oral fluids as tolerated

## 2022-07-17 ENCOUNTER — Other Ambulatory Visit: Payer: Self-pay

## 2022-07-18 ENCOUNTER — Other Ambulatory Visit: Payer: Self-pay

## 2022-07-22 MED FILL — Dexamethasone Sodium Phosphate Inj 100 MG/10ML: INTRAMUSCULAR | Qty: 1 | Status: AC

## 2022-07-23 ENCOUNTER — Other Ambulatory Visit: Payer: Self-pay

## 2022-07-23 ENCOUNTER — Other Ambulatory Visit: Payer: Self-pay | Admitting: Hematology and Oncology

## 2022-07-23 ENCOUNTER — Inpatient Hospital Stay (HOSPITAL_BASED_OUTPATIENT_CLINIC_OR_DEPARTMENT_OTHER): Payer: Managed Care, Other (non HMO) | Attending: Gynecologic Oncology | Admitting: Hematology and Oncology

## 2022-07-23 ENCOUNTER — Inpatient Hospital Stay: Payer: Managed Care, Other (non HMO) | Attending: Gynecologic Oncology

## 2022-07-23 ENCOUNTER — Encounter: Payer: Self-pay | Admitting: Hematology and Oncology

## 2022-07-23 ENCOUNTER — Inpatient Hospital Stay: Payer: Managed Care, Other (non HMO)

## 2022-07-23 VITALS — BP 136/84 | HR 94 | Temp 98.3°F | Resp 16 | Wt 214.5 lb

## 2022-07-23 DIAGNOSIS — D61818 Other pancytopenia: Secondary | ICD-10-CM | POA: Insufficient documentation

## 2022-07-23 DIAGNOSIS — R7989 Other specified abnormal findings of blood chemistry: Secondary | ICD-10-CM | POA: Insufficient documentation

## 2022-07-23 DIAGNOSIS — D6481 Anemia due to antineoplastic chemotherapy: Secondary | ICD-10-CM

## 2022-07-23 DIAGNOSIS — Z5189 Encounter for other specified aftercare: Secondary | ICD-10-CM | POA: Insufficient documentation

## 2022-07-23 DIAGNOSIS — Z90710 Acquired absence of both cervix and uterus: Secondary | ICD-10-CM | POA: Diagnosis not present

## 2022-07-23 DIAGNOSIS — T451X5A Adverse effect of antineoplastic and immunosuppressive drugs, initial encounter: Secondary | ICD-10-CM

## 2022-07-23 DIAGNOSIS — N179 Acute kidney failure, unspecified: Secondary | ICD-10-CM | POA: Diagnosis not present

## 2022-07-23 DIAGNOSIS — C549 Malignant neoplasm of corpus uteri, unspecified: Secondary | ICD-10-CM

## 2022-07-23 DIAGNOSIS — Z5111 Encounter for antineoplastic chemotherapy: Secondary | ICD-10-CM | POA: Insufficient documentation

## 2022-07-23 LAB — CBC WITH DIFFERENTIAL (CANCER CENTER ONLY)
Abs Immature Granulocytes: 0.06 10*3/uL (ref 0.00–0.07)
Basophils Absolute: 0.1 10*3/uL (ref 0.0–0.1)
Basophils Relative: 1 %
Eosinophils Absolute: 0 10*3/uL (ref 0.0–0.5)
Eosinophils Relative: 1 %
HCT: 21.8 % — ABNORMAL LOW (ref 36.0–46.0)
Hemoglobin: 7.8 g/dL — ABNORMAL LOW (ref 12.0–15.0)
Immature Granulocytes: 1 %
Lymphocytes Relative: 32 %
Lymphs Abs: 1.6 10*3/uL (ref 0.7–4.0)
MCH: 30 pg (ref 26.0–34.0)
MCHC: 35.8 g/dL (ref 30.0–36.0)
MCV: 83.8 fL (ref 80.0–100.0)
Monocytes Absolute: 0.6 10*3/uL (ref 0.1–1.0)
Monocytes Relative: 12 %
Neutro Abs: 2.6 10*3/uL (ref 1.7–7.7)
Neutrophils Relative %: 53 %
Platelet Count: 222 10*3/uL (ref 150–400)
RBC: 2.6 MIL/uL — ABNORMAL LOW (ref 3.87–5.11)
RDW: 16.3 % — ABNORMAL HIGH (ref 11.5–15.5)
Smear Review: NORMAL
WBC Count: 4.9 10*3/uL (ref 4.0–10.5)
nRBC: 0 % (ref 0.0–0.2)

## 2022-07-23 LAB — CMP (CANCER CENTER ONLY)
ALT: 45 U/L — ABNORMAL HIGH (ref 0–44)
AST: 38 U/L (ref 15–41)
Albumin: 3.5 g/dL (ref 3.5–5.0)
Alkaline Phosphatase: 62 U/L (ref 38–126)
Anion gap: 5 (ref 5–15)
BUN: 12 mg/dL (ref 8–23)
CO2: 26 mmol/L (ref 22–32)
Calcium: 9 mg/dL (ref 8.9–10.3)
Chloride: 106 mmol/L (ref 98–111)
Creatinine: 0.85 mg/dL (ref 0.44–1.00)
GFR, Estimated: >60 mL/min (ref >60)
Glucose, Bld: 109 mg/dL — ABNORMAL HIGH (ref 70–99)
Potassium: 3.6 mmol/L (ref 3.5–5.1)
Sodium: 137 mmol/L (ref 135–145)
Total Bilirubin: 0.3 mg/dL (ref 0.3–1.2)
Total Protein: 6.2 g/dL — ABNORMAL LOW (ref 6.5–8.1)

## 2022-07-23 LAB — PREPARE RBC (CROSSMATCH)

## 2022-07-23 MED ORDER — SODIUM CHLORIDE 0.9 % IV SOLN
10.0000 mg | Freq: Once | INTRAVENOUS | Status: AC
  Administered 2022-07-23: 10 mg via INTRAVENOUS
  Filled 2022-07-23: qty 10

## 2022-07-23 MED ORDER — SODIUM CHLORIDE 0.9 % IV SOLN
600.0000 mg/m2 | Freq: Once | INTRAVENOUS | Status: AC
  Administered 2022-07-23: 1292 mg via INTRAVENOUS
  Filled 2022-07-23: qty 33.98

## 2022-07-23 MED ORDER — PROCHLORPERAZINE MALEATE 10 MG PO TABS
10.0000 mg | ORAL_TABLET | Freq: Once | ORAL | Status: AC
  Administered 2022-07-23: 10 mg via ORAL
  Filled 2022-07-23: qty 1

## 2022-07-23 MED ORDER — SODIUM CHLORIDE 0.9% FLUSH
10.0000 mL | Freq: Once | INTRAVENOUS | Status: AC
  Administered 2022-07-23: 10 mL

## 2022-07-23 MED ORDER — HEPARIN SOD (PORK) LOCK FLUSH 100 UNIT/ML IV SOLN
500.0000 [IU] | Freq: Once | INTRAVENOUS | Status: AC | PRN
  Administered 2022-07-23: 500 [IU]

## 2022-07-23 MED ORDER — SODIUM CHLORIDE 0.9 % IV SOLN
75.0000 mg/m2 | Freq: Once | INTRAVENOUS | Status: AC
  Administered 2022-07-23: 160 mg via INTRAVENOUS
  Filled 2022-07-23: qty 16

## 2022-07-23 MED ORDER — SODIUM CHLORIDE 0.9% FLUSH
10.0000 mL | INTRAVENOUS | Status: DC | PRN
  Administered 2022-07-23: 10 mL

## 2022-07-23 MED ORDER — SODIUM CHLORIDE 0.9 % IV SOLN: Freq: Once | INTRAVENOUS | Status: AC

## 2022-07-23 NOTE — Assessment & Plan Note (Signed)
Overall, she is symptomatic with progressive pancytopenia We discussed the risk and benefits of dose reduction, transfusion support or omission of treatment or delaying treatment Ultimately, she wants to continue her treatment as scheduled today and agree for blood transfusion support I plan to reduce the dose of gemcitabine a little bit further I will arrange for blood transfusion support within the next 2 days

## 2022-07-23 NOTE — Patient Instructions (Signed)
[facility]ICAL [facility]COLOGY   Discharge Instructions: Thank you for choosing [facility] to provide your oncology and hematology care.   If you have a lab appointment with the Cancer Center, please go directly to the Cancer Center and check in at the registration area.   Wear comfortable clothing and clothing appropriate for easy access to any Portacath or PICC line.   We strive to give you quality time with your provider. You may need to reschedule your appointment if you arrive late (15 or more minutes).  Arriving late affects you and other patients whose appointments are after yours.  Also, if you miss three or more appointments without notifying the office, you may be dismissed from the clinic at the provider's discretion.      For prescription refill requests, have your pharmacy contact our office and allow 72 hours for refills to be completed.    Today you received the following chemotherapy and/or immunotherapy agents: gemcitabine and docetaxel      To help prevent nausea and vomiting after your treatment, we encourage you to take your nausea medication as directed.  BELOW ARE SYMPTOMS THAT SHOULD BE REPORTED IMMEDIATELY: *FEVER GREATER THAN 100.4 F (38 C) OR HIGHER *CHILLS OR SWEATING *NAUSEA AND VOMITING THAT IS NOT CONTROLLED WITH YOUR NAUSEA MEDICATION *UNUSUAL SHORTNESS OF BREATH *UNUSUAL BRUISING OR BLEEDING *URINARY PROBLEMS (pain or burning when urinating, or frequent urination) *BOWEL PROBLEMS (unusual diarrhea, constipation, pain near the anus) TENDERNESS IN MOUTH AND THROAT WITH OR WITHOUT PRESENCE OF ULCERS (sore throat, sores in mouth, or a toothache) UNUSUAL RASH, SWELLING OR PAIN  UNUSUAL VAGINAL DISCHARGE OR ITCHING   Items with * indicate a potential emergency and should be followed up as soon as possible or go to the Emergency Department if any problems should occur.  Please show the CHEMOTHERAPY ALERT CARD or IMMUNOTHERAPY ALERT  CARD at check-in to the Emergency Department and triage nurse.  Should you have questions after your visit or need to cancel or reschedule your appointment, please contact [facility]COL[facility]  Dept: [phone]  and follow the prompts.  Office hours are 8:00 a.m. to 4:30 p.m. Monday - Friday. Please note that voicemails left after 4:00 p.m. may not be returned until the following business day.  We are closed weekends and major holidays. You have access to a nurse at all times for urgent questions. Please call the main number to the clinic Dept: [phone] and follow the prompts.   For any non-urgent questions, you may also contact your provider using MyChart. We now offer e-Visits for anyone 18 and older to request care online for non-urgent symptoms. For details visit [email or web address].   Also download the MyChart app! Go to the app store, search "MyChart", open the app, select [facility], and log in with your MyChart username and password.  Masks are optional in the cancer centers. If you would like for your care team to wear a mask while they are taking care of you, please let them know. You may have one support person who is at least 63 years old accompany you for your appointments. 

## 2022-07-23 NOTE — Assessment & Plan Note (Signed)
This has resolved.  She can resume taking her antihypertensives Advised her to drink more fluids

## 2022-07-23 NOTE — Progress Notes (Signed)
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 The patient is symptomatic from anemia and the hemoglobin level is critically low.  Some of the side-effects to be expected including risks of transfusion reactions, chills, infection, syndrome of volume overload and risk of hospitalization from various reasons and the patient is willing to proceed and went ahead to sign consent today. I will proceed to reduce the dose of gemcitabine of little bit further and arrange for 1 unit of blood transfusion support  Elevated LFTs This has improved.  Observe only  Acute renal failure (ARF) (HCC) This has resolved.  She can resume taking her antihypertensives Advised her to drink more fluids  Orders Placed This Encounter  Procedures   Informed Consent Details: Physician/Practitioner Attestation; Transcribe to consent form and obtain patient signature    Standing Status:   Future    Number of Occurrences:   1    Standing Expiration Date:   07/24/2023    Order Specific Question:   Physician/Practitioner attestation of informed consent for blood and or blood product transfusion    Answer:   I, the physician/practitioner, attest that I have  discussed with the patient the benefits, risks, side effects, alternatives, likelihood of achieving goals and potential problems during recovery for the procedure that I have provided informed consent.    Order Specific Question:   Product(s)    Answer:   All Product(s)   Care order/instruction    Transfuse Parameters    Standing Status:   Future    Number of Occurrences:   1    Standing Expiration Date:   07/23/2023   Type and screen         Standing Status:   Future    Number of Occurrences:   1    Standing Expiration Date:   07/24/2023   Prepare RBC (crossmatch)    Standing Status:   Standing    Number of Occurrences:   1    Order Specific Question:   # of Units    Answer:   1 unit    Order Specific Question:   Transfusion Indications    Answer:   Symptomatic Anemia    Order Specific Question:   Number of Units to Keep Ahead    Answer:   NO units ahead    Order Specific Question:   Instructions:    Answer:   Transfuse    Order Specific Question:   If emergent release call blood bank    Answer:   Not emergent release    All questions were answered. The patient knows to call the clinic with any problems, questions or concerns. The total time spent in the appointment was 40 minutes encounter with patients including review of chart and various tests results, discussions about plan of care and  coordination of care plan   [name], MD 07/23/2022 9:44 AM  INTERVAL HISTORY: Please see below for problem oriented charting. she returns for treatment follow-up and she is seen in the infusion room due to worsening pancytopenia She complained of fatigue and shortness of breath on minimal exertion No recent bleeding Denies recent nausea  REVIEW OF SYSTEMS:   Constitutional: Denies fevers, chills or abnormal weight loss Eyes: Denies blurriness of vision Ears, nose, mouth, throat, and face: Denies mucositis or sore throat Respiratory: Denies cough, dyspnea or wheezes Cardiovascular:  Denies palpitation, chest discomfort or lower extremity swelling Gastrointestinal:  Denies nausea, heartburn or change in bowel habits Skin: Denies abnormal skin rashes Lymphatics: Denies new lymphadenopathy or easy bruising Neurological:Denies numbness, tingling or new weaknesses Behavioral/Psych: Mood is stable, no new changes  All other systems were reviewed with the patient and are negative.  I have reviewed the past medical history, past surgical history, social history and family history with the patient and they are unchanged from previous note.  ALLERGIES:  has No Known Allergies.  MEDICATIONS:  Current Outpatient Medications  Medication Sig Dispense Refill   acetaminophen (TYLENOL) 500 MG tablet Take 500 mg by mouth every 6 (six) hours as needed for mild pain, moderate pain or fever.     aspirin 81 MG EC tablet Take by mouth daily.     cetirizine (ZYRTEC) 10 MG tablet Take 10 mg by mouth daily.     Cholecalciferol (VITAMIN D3 PO) Take 1 tablet by mouth daily.     lidocaine-prilocaine (EMLA) cream Apply 1 Application topically as needed. 30 g 3   losartan-hydrochlorothiazide (HYZAAR) 100-25 MG tablet Take 1 tablet by mouth daily. HOLD 8/31 per Dr. [name]     magic mouthwash (nystatin, lidocaine, diphenhydrAMINE, alum & mag hydroxide) suspension Swish and spit with 5 MLs by mouth 4 times a day for 7 days 180 mL 0   omeprazole (PRILOSEC) 20 MG capsule Take 1 capsule (20 mg total) by mouth 2 (two) times daily. 28 capsule 0   No current facility-administered medications for this visit.   Facility-Administered Medications Ordered in Other Visits  Medication Dose Route Frequency Provider Last Rate Last Admin   DOCEtaxel (TAXOTERE) 160 mg in sodium chloride 0.9 % 250 mL chemo infusion  75 mg/m2 (Treatment Plan Recorded) Intravenous Once [name], MD       gemcitabine (GEMZAR) 1,292 mg in sodium chloride 0.9 % 250 mL chemo infusion  600 mg/m2 (Treatment Plan Recorded) Intravenous  Once [name], Kiyonna Tortorelli, MD       heparin lock flush 100 unit/mL  500 Units Intracatheter Once PRN [name], Kalin Kyler, MD       sodium chloride flush (NS) 0.9 % injection 10 mL  10 mL Intracatheter PRN [name], MD        SUMMARY OF ONCOLOGIC HISTORY: Oncology History Overview Note  High grade and LMS, 50% ER/PR positive dMMR normal, PD-L1 CPS 3%   Uterine sarcoma ([place])  06/02/2021 Imaging   MRI pelvis Heterogeneous 9.0 cm intrauterine mass within the intramural/submucosal anterior fundus demonstrating interval growth, internal enhancement, and restricted diffusion 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 No extra uterine extension. No evidence of metastatic disease within the pelvis.   Mild thickening of the endometrial stripe, likely related to entrapment by the adjacent uterine mass   06/03/2021 Initial Diagnosis   Uterine sarcoma ([place])   06/03/2021 Cancer Staging   Staging form: Corpus Uteri - Sarcoma, AJCC 7th Edition - Clinical stage  from 06/03/2021: FIGO Stage IVB (rT1b, N0, M1) - Signed by [name], MD on 03/31/2022 Diagnostic confirmation: Positive histology Stage prefix: Recurrence Biopsy of metastatic site performed: No Lymph-vascular invasion (LVI): LVI present/identified, NOS   06/03/2021 Pathology Results   FINAL MICROSCOPIC DIAGNOSIS:   A. UTERUS, CERVIX, BILATERAL FALLOPIAN TUBES AND OVARIES, HYSTERECTOMY:  - Uterus:       Mixed high grade uterine sarcoma, spanning 6 cm, see comment.       Extensive lymphovascular invasion.       See oncology table.  - Cervix: Benign squamous and endocervical mucosa. No dysplasia or  malignancy.  - Bilateral ovaries: Unremarkable. No malignancy.  - Bilateral fallopian tubes: Unremarkable. No malignancy.   ONCOLOGY TABLE:   UTERUS, SARCOMA: Resection   Procedure: Total hysterectomy and bilateral salpingo-oophorectomy  Specimen Integrity: Focally disrupted on posterior surface  Tumor Site: Anterior  wall  Tumor Size: 6 cm  Histologic Type: High grade sarcoma, mixed. See comment.  Other Tissue/ Organ Involvement: Not identified  Lymphovascular Invasion: Present, extensive.  Margins: All margins negative for tumor  Regional Lymph Nodes: Not applicable (no lymph nodes submitted or found)  Distant Metastasis:       Distant Site(s) Involved: Not applicable  Pathologic Stage Classification (pTNM, AJCC 8th Edition): pT1b, pN not  assigned  Ancillary Studies: Can be performed upon request  Representative Tumor Block: A6  Comment(s): The tumor has two morphologically different components. One component consists of malignant spindle cells which can be seen arising from the smooth muscle. These foci are positive for SMA, desmin, cyclinD1 (focal), and CD10 (patchy). The other component consists of round to slightly spindle cells with abundant admixed vessels and occasional large pleomorphic cells. These areas are positive for SMA (patchy weak), desmin (focal), CD10 (variable with diffuse areas). Pancytokeratin is negative. 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   ADDENDUM:   PROGNOSTIC INDICATOR RESULTS:   Immunohistochemical and morphometric analysis performed manually    Estrogen Receptor:       POSITIVE, 50%, WEAK TO MODERATE STAINING  Progesterone Receptor:   POSITIVE, 50%, MODERATE TO STRONG STAINING   Reference Range Estrogen and Progesterone Receptor       Negative  0%       Positive  >1%    06/03/2021 Surgery   Surgeon: Donaciano Eva    Assistants: Dr Lahoma Crocker (an MD assistant was necessary for tissue manipulation, management of robotic instrumentation, retraction and positioning due to the complexity of the case and hospital policies).  Operation: Robotic-assisted laparoscopic total hysterectomy >250gm with bilateral salpingoophorectomy, minilaparotomy for specimen delivery   Surgeon:  Donaciano Eva    Operative Findings:  : Bulky 16cm uterus with intra-uterine mass (consistent with a fibroid-like mass) , smooth normal appearing serosa, no suspicious bulky nodes. Normal ovaries bilaterally. Normal upper abdomen. Uterus too large to deliver vaginally in tact.    06/18/2021 Imaging   1. Multiple bilateral pulmonary nodules, measuring up to 9 mm. Most of these are perifissural and subpleural in location, likely lymph nodes. Nevertheless, close follow-up recommended to exclude metastatic disease. 2. 11 mm hypoattenuating lesion towards the dome of the liver, indeterminate. MRI abdomen with and without contrast could be used to further evaluate as clinically warranted. 3. Aortic Atherosclerosis (ICD10-I70.0).   10/13/2021 Imaging   1. No acute findings within the abdomen or pelvis. No specific findings identified to suggest residual or recurrence of tumor. 2. Stable small pulmonary  nodules within the left lower lobe.   03/18/2022 Imaging   1. Interval development of 2 soft tissue nodules in the anterior pelvis measuring 10 mm and 7 mm respectively. Close follow-up recommended to exclude metastatic disease. PET-CT may be warranted to further evaluate. 2. Development of 2 mm left upper lobe parenchymal nodule, nonspecific. Close attention on follow-up imaging recommended  3. Stable appearance of index bilateral pulmonary nodules identified previously. 4. Aortic Atherosclerosis (ICD10-I70.0).   04/02/2022 PET scan   1. Signs of local tumor recurrence noted at the vaginal cuff. 2. 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 New since 10/13/2021 and progressive when compared with 03/17/2022. 3. New focal area of increased uptake within the right biceps femoris muscle which is suspicious for skeletal muscle involvement. 4. Stable appearance of small pulmonary nodules described on 03/17/2022. These are too small to characterize by PET-CT.    04/09/2022 Procedure   Successful placement of a right IJ approach Power Port with ultrasound and fluoroscopic guidance. The catheter is ready for use.   04/09/2022 Echocardiogram    1. Left ventricular ejection fraction, by estimation, is 65 to 70%. The left ventricle has normal function. The left ventricle has no regional wall motion abnormalities. There is mild concentric left ventricular hypertrophy. Left ventricular diastolic parameters are consistent with Grade I diastolic dysfunction (impaired relaxation).  2. Right ventricular systolic function is normal. The right ventricular size is normal.  3. The mitral valve is normal in structure. No evidence of mitral valve regurgitation.  4. The aortic valve is tricuspid. Aortic valve regurgitation is not visualized. No aortic stenosis is present.  5. The inferior vena cava is normal in size with greater than 50% respiratory variability, suggesting right atrial pressure of 3 mmHg.   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 Status post hysterectomy and bilateral oophorectomy. Multiple newand enlarged peritoneal nodules throughout the low abdomen and pelvis, previously FDG avid and consistent with worsened locally recurrent and peritoneal metastatic disease. 2. Multiple small bilateral pulmonary nodules, several of which are slightly enlarged compared to prior examination, consistent with worsened pulmonary metastatic disease.   Aortic Atherosclerosis (ICD10-I70.0).     06/25/2022 - 07/06/2022 Chemotherapy   Patient is on Treatment Plan : UTERINE UNDIFFERENTIATED / LEIOMYOSARCOMA Gemcitabine D1,8 + Docetaxel D8 (900/100) q21d     06/25/2022 -  Chemotherapy   Patient is on Treatment Plan : UTERINE UNDIFFERENTIATED LEIOMYOSARCOMA Gemcitabine D1,8 + Docetaxel D8 (900/100) q21d       PHYSICAL EXAMINATION: ECOG PERFORMANCE STATUS: 1 - Symptomatic  but completely ambulatory GENERAL:alert, no distress and comfortable NEURO: alert & oriented x 3 with fluent speech, no focal motor/sensory deficits  LABORATORY DATA:  I have reviewed the data as listed    Component Value Date/Time   NA 137 07/23/2022 0732   K 3.6 07/23/2022 0732   CL 106 07/23/2022 0732   CO2 26 07/23/2022 0732   GLUCOSE 109 (H) 07/23/2022 0732   BUN 12 07/23/2022 0732   CREATININE 0.85 07/23/2022 0732   CALCIUM 9.0 07/23/2022 0732   PROT 6.2 (L) 07/23/2022 0732   ALBUMIN 3.5 07/23/2022 0732   AST 38 07/23/2022 0732   ALT 45 (H) 07/23/2022 0732   ALKPHOS 62 07/23/2022 0732   BILITOT 0.3 07/23/2022 0732   GFRNONAA >60 07/23/2022 0732    No results found for: "SPEP", "UPEP"  Lab Results  Component Value Date   WBC 4.9 07/23/2022   NEUTROABS 2.6 07/23/2022   HGB 7.8 (L) 07/23/2022   HCT 21.8 (L) 07/23/2022   MCV 83.8 07/23/2022   PLT 222 07/23/2022      Chemistry      Component Value Date/Time   NA 137 07/23/2022 0732   K 3.6 07/23/2022 0732   CL 106 07/23/2022 0732   CO2 26 07/23/2022 0732   BUN 12 07/23/2022 0732   CREATININE 0.85 07/23/2022 0732      Component Value Date/Time   CALCIUM 9.0 07/23/2022 0732   ALKPHOS 62 07/23/2022 0732   AST 38 07/23/2022 0732   ALT 45 (H) 07/23/2022 0732   BILITOT 0.3 07/23/2022 0732

## 2022-07-23 NOTE — Progress Notes (Signed)
Ok to proceed with Hgb 7.8 per Dr [name].

## 2022-07-23 NOTE — Assessment & Plan Note (Signed)
This has improved.  Observe only

## 2022-07-23 NOTE — Assessment & Plan Note (Signed)
We discussed some of the risks, benefits, and alternatives of blood transfusions. The patient is symptomatic from anemia and the hemoglobin level is critically low.  Some of the side-effects to be expected including risks of transfusion reactions, chills, infection, syndrome of volume overload and risk of hospitalization from various reasons and the patient is willing to proceed and went ahead to sign consent today. I will proceed to reduce the dose of gemcitabine of little bit further and arrange for 1 unit of blood transfusion support

## 2022-07-24 ENCOUNTER — Inpatient Hospital Stay: Payer: Managed Care, Other (non HMO)

## 2022-07-24 VITALS — BP 121/64 | HR 92 | Temp 98.1°F | Resp 17

## 2022-07-24 DIAGNOSIS — C549 Malignant neoplasm of corpus uteri, unspecified: Secondary | ICD-10-CM

## 2022-07-24 DIAGNOSIS — Z5111 Encounter for antineoplastic chemotherapy: Secondary | ICD-10-CM | POA: Diagnosis not present

## 2022-07-24 DIAGNOSIS — D6481 Anemia due to antineoplastic chemotherapy: Secondary | ICD-10-CM

## 2022-07-24 MED ORDER — PEGFILGRASTIM INJECTION 6 MG/0.6ML ~~LOC~~
6.0000 mg | PREFILLED_SYRINGE | Freq: Once | SUBCUTANEOUS | Status: AC
  Administered 2022-07-24: 6 mg via SUBCUTANEOUS
  Filled 2022-07-24: qty 0.6

## 2022-07-24 MED ORDER — ACETAMINOPHEN 325 MG PO TABS
650.0000 mg | ORAL_TABLET | Freq: Once | ORAL | Status: AC
  Administered 2022-07-24: 650 mg via ORAL
  Filled 2022-07-24: qty 2

## 2022-07-24 MED ORDER — DIPHENHYDRAMINE HCL 25 MG PO CAPS
25.0000 mg | ORAL_CAPSULE | Freq: Once | ORAL | Status: AC
  Administered 2022-07-24: 25 mg via ORAL
  Filled 2022-07-24: qty 1

## 2022-07-24 MED ORDER — SODIUM CHLORIDE 0.9% IV SOLUTION
250.0000 mL | Freq: Once | INTRAVENOUS | Status: AC
  Administered 2022-07-24: 250 mL via INTRAVENOUS

## 2022-07-24 MED ORDER — SODIUM CHLORIDE 0.9% FLUSH
10.0000 mL | INTRAVENOUS | Status: AC | PRN
  Administered 2022-07-24: 10 mL

## 2022-07-24 MED ORDER — HEPARIN SOD (PORK) LOCK FLUSH 100 UNIT/ML IV SOLN
250.0000 [IU] | INTRAVENOUS | Status: AC | PRN
  Administered 2022-07-24: 250 [IU]

## 2022-07-24 NOTE — Patient Instructions (Signed)

## 2022-07-25 ENCOUNTER — Inpatient Hospital Stay: Payer: Managed Care, Other (non HMO)

## 2022-07-25 LAB — BPAM RBC
Blood Product Expiration Date: 202310052359
ISSUE DATE / TIME: 202309081237
Unit Type and Rh: 5100

## 2022-07-25 LAB — TYPE AND SCREEN
ABO/RH(D): O POS
Antibody Screen: NEGATIVE
Unit division: 0

## 2022-07-31 ENCOUNTER — Other Ambulatory Visit: Payer: Self-pay | Admitting: Hematology and Oncology

## 2022-08-06 ENCOUNTER — Telehealth: Payer: Self-pay | Admitting: *Deleted

## 2022-08-06 NOTE — Telephone Encounter (Signed)
[place] Nurse Advocate Lavonna Monarch [phone] ext. [phone]) faxed letter with form for Leah Diaz's treatment plan reads "Attention Nursing".  This forms nurse completed form.  Successfully returned via fax [phone]).  Original mailed to patient address on file. [address] [name] [address] Copy to bin designated for scanning to EMR completes process.  No further instructions received, actions required or performed by this nurse.

## 2022-08-07 ENCOUNTER — Inpatient Hospital Stay: Payer: Managed Care, Other (non HMO)

## 2022-08-07 ENCOUNTER — Other Ambulatory Visit (HOSPITAL_COMMUNITY): Payer: Self-pay

## 2022-08-07 ENCOUNTER — Encounter: Payer: Self-pay | Admitting: Hematology and Oncology

## 2022-08-07 ENCOUNTER — Other Ambulatory Visit: Payer: Self-pay

## 2022-08-07 ENCOUNTER — Inpatient Hospital Stay (HOSPITAL_BASED_OUTPATIENT_CLINIC_OR_DEPARTMENT_OTHER): Payer: Managed Care, Other (non HMO) | Admitting: Hematology and Oncology

## 2022-08-07 VITALS — BP 143/65 | HR 113 | Temp 97.9°F | Resp 14 | Ht 67.5 in | Wt 211.3 lb

## 2022-08-07 VITALS — BP 119/73 | HR 97 | Temp 98.3°F | Resp 14

## 2022-08-07 DIAGNOSIS — K1231 Oral mucositis (ulcerative) due to antineoplastic therapy: Secondary | ICD-10-CM

## 2022-08-07 DIAGNOSIS — C549 Malignant neoplasm of corpus uteri, unspecified: Secondary | ICD-10-CM

## 2022-08-07 DIAGNOSIS — Z5111 Encounter for antineoplastic chemotherapy: Secondary | ICD-10-CM | POA: Diagnosis not present

## 2022-08-07 DIAGNOSIS — T451X5A Adverse effect of antineoplastic and immunosuppressive drugs, initial encounter: Secondary | ICD-10-CM | POA: Diagnosis not present

## 2022-08-07 DIAGNOSIS — D6481 Anemia due to antineoplastic chemotherapy: Secondary | ICD-10-CM | POA: Diagnosis not present

## 2022-08-07 LAB — CMP (CANCER CENTER ONLY)
ALT: 23 U/L (ref 0–44)
AST: 27 U/L (ref 15–41)
Albumin: 3.5 g/dL (ref 3.5–5.0)
Alkaline Phosphatase: 87 U/L (ref 38–126)
Anion gap: 7 (ref 5–15)
BUN: 11 mg/dL (ref 8–23)
CO2: 29 mmol/L (ref 22–32)
Calcium: 8.7 mg/dL — ABNORMAL LOW (ref 8.9–10.3)
Chloride: 99 mmol/L (ref 98–111)
Creatinine: 0.76 mg/dL (ref 0.44–1.00)
GFR, Estimated: >60 mL/min (ref >60)
Glucose, Bld: 146 mg/dL — ABNORMAL HIGH (ref 70–99)
Potassium: 3.5 mmol/L (ref 3.5–5.1)
Sodium: 135 mmol/L (ref 135–145)
Total Bilirubin: 0.4 mg/dL (ref 0.3–1.2)
Total Protein: 6 g/dL — ABNORMAL LOW (ref 6.5–8.1)

## 2022-08-07 LAB — CBC WITH DIFFERENTIAL (CANCER CENTER ONLY)
Abs Immature Granulocytes: 2.42 10*3/uL — ABNORMAL HIGH (ref 0.00–0.07)
Basophils Absolute: 0.5 10*3/uL — ABNORMAL HIGH (ref 0.0–0.1)
Basophils Relative: 2 %
Eosinophils Absolute: 0.3 10*3/uL (ref 0.0–0.5)
Eosinophils Relative: 1 %
HCT: 28.3 % — ABNORMAL LOW (ref 36.0–46.0)
Hemoglobin: 9.5 g/dL — ABNORMAL LOW (ref 12.0–15.0)
Immature Granulocytes: 11 %
Lymphocytes Relative: 7 %
Lymphs Abs: 1.4 10*3/uL (ref 0.7–4.0)
MCH: 30.4 pg (ref 26.0–34.0)
MCHC: 33.6 g/dL (ref 30.0–36.0)
MCV: 90.4 fL (ref 80.0–100.0)
Monocytes Absolute: 2.9 10*3/uL — ABNORMAL HIGH (ref 0.1–1.0)
Monocytes Relative: 13 %
Neutro Abs: 14.5 10*3/uL — ABNORMAL HIGH (ref 1.7–7.7)
Neutrophils Relative %: 66 %
Platelet Count: 680 10*3/uL — ABNORMAL HIGH (ref 150–400)
RBC: 3.13 MIL/uL — ABNORMAL LOW (ref 3.87–5.11)
RDW: 19.8 % — ABNORMAL HIGH (ref 11.5–15.5)
Smear Review: NORMAL
WBC Count: 22.1 10*3/uL — ABNORMAL HIGH (ref 4.0–10.5)
nRBC: 0.4 % — ABNORMAL HIGH (ref 0.0–0.2)

## 2022-08-07 MED ORDER — SODIUM CHLORIDE 0.9% FLUSH
10.0000 mL | Freq: Once | INTRAVENOUS | Status: AC
  Administered 2022-08-07: 10 mL

## 2022-08-07 MED ORDER — HEPARIN SOD (PORK) LOCK FLUSH 100 UNIT/ML IV SOLN
500.0000 [IU] | Freq: Once | INTRAVENOUS | Status: AC | PRN
  Administered 2022-08-07: 500 [IU]

## 2022-08-07 MED ORDER — SODIUM CHLORIDE 0.9 % IV SOLN: Freq: Once | INTRAVENOUS | Status: DC

## 2022-08-07 MED ORDER — NYSTATIN 100000 UNIT/ML MT SUSP
5.0000 mL | Freq: Four times a day (QID) | OROMUCOSAL | 0 refills | Status: AC
  Filled 2022-08-07: qty 140, 7d supply, fill #0

## 2022-08-07 MED ORDER — SODIUM CHLORIDE 0.9% FLUSH
10.0000 mL | INTRAVENOUS | Status: DC | PRN
  Administered 2022-08-07: 10 mL

## 2022-08-07 MED ORDER — SODIUM CHLORIDE 0.9 % IV SOLN: Freq: Once | INTRAVENOUS | Status: AC

## 2022-08-07 MED ORDER — PROCHLORPERAZINE MALEATE 10 MG PO TABS
10.0000 mg | ORAL_TABLET | Freq: Once | ORAL | Status: AC
  Administered 2022-08-07: 10 mg via ORAL
  Filled 2022-08-07: qty 1

## 2022-08-07 MED ORDER — SODIUM CHLORIDE 0.9 % IV SOLN
600.0000 mg/m2 | Freq: Once | INTRAVENOUS | Status: AC
  Administered 2022-08-07: 1292 mg via INTRAVENOUS
  Filled 2022-08-07: qty 33.98

## 2022-08-07 NOTE — Assessment & Plan Note (Signed)
She had multiple side effects from treatment including profound fatigue, recent anemia requiring blood transfusion and others We discussed the role of modification of treatment by adding an additional week break to allow recovery rather than continue on the trajectory of further dose reduction She is in agreement I will move her next week's treatment to the following week I plan to order CT imaging next month for objective assessment of response to therapy

## 2022-08-07 NOTE — Patient Instructions (Signed)
[facility] MEDICAL ONCOLOGY  Discharge Instructions: Thank you for choosing [facility] to provide your oncology and hematology care.   If you have a lab appointment with the Cancer Center, please go directly to the Cancer Center and check in at the registration area.   Wear comfortable clothing and clothing appropriate for easy access to any Portacath or PICC line.   We strive to give you quality time with your provider. You may need to reschedule your appointment if you arrive late (15 or more minutes).  Arriving late affects you and other patients whose appointments are after yours.  Also, if you miss three or more appointments without notifying the office, you may be dismissed from the clinic at the provider's discretion.      For prescription refill requests, have your pharmacy contact our office and allow 72 hours for refills to be completed.    Today you received the following chemotherapy and/or immunotherapy agents: Gemcitabine.       To help prevent nausea and vomiting after your treatment, we encourage you to take your nausea medication as directed.  BELOW ARE SYMPTOMS THAT SHOULD BE REPORTED IMMEDIATELY: *FEVER GREATER THAN 100.4 F (38 C) OR HIGHER *CHILLS OR SWEATING *NAUSEA AND VOMITING THAT IS NOT CONTROLLED WITH YOUR NAUSEA MEDICATION *UNUSUAL SHORTNESS OF BREATH *UNUSUAL BRUISING OR BLEEDING *URINARY PROBLEMS (pain or burning when urinating, or frequent urination) *BOWEL PROBLEMS (unusual diarrhea, constipation, pain near the anus) TENDERNESS IN MOUTH AND THROAT WITH OR WITHOUT PRESENCE OF ULCERS (sore throat, sores in mouth, or a toothache) UNUSUAL RASH, SWELLING OR PAIN  UNUSUAL VAGINAL DISCHARGE OR ITCHING   Items with * indicate a potential emergency and should be followed up as soon as possible or go to the Emergency Department if any problems should occur.  Please show the CHEMOTHERAPY ALERT CARD or IMMUNOTHERAPY ALERT CARD at check-in  to the Emergency Department and triage nurse.  Should you have questions after your visit or need to cancel or reschedule your appointment, please contact [facility]ICAL ONCOLOGY  Dept: [phone]  and follow the prompts.  Office hours are 8:00 a.m. to 4:30 p.m. Monday - Friday. Please note that voicemails left after 4:00 p.m. may not be returned until the following business day.  We are closed weekends and major holidays. You have access to a nurse at all times for urgent questions. Please call the main number to the clinic Dept: [phone] and follow the prompts.   For any non-urgent questions, you may also contact your provider using MyChart. We now offer e-Visits for anyone 18 and older to request care online for non-urgent symptoms. For details visit [email or web address].   Also download the MyChart app! Go to the app store, search "MyChart", open the app, select [facility], and log in with your MyChart username and password.  Masks are optional in the cancer centers. If you would like for your care team to wear a mask while they are taking care of you, please let them know. You may have one support person who is at least 63 years old accompany you for your appointments. 

## 2022-08-07 NOTE — Assessment & Plan Note (Signed)
She has history of intermittent mucositis We will continue Magic mouthwash with lidocaine as needed

## 2022-08-07 NOTE — Assessment & Plan Note (Signed)
Her imaging anemia has improved with recent transfusion support She does not need blood transfusion today As above, we will modify her treatment plan so that she get an additional week of break to allow bone marrow recovery prior to day 8 of therapy

## 2022-08-07 NOTE — Progress Notes (Signed)
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 including profound fatigue, recent anemia requiring blood transfusion and others We discussed the role of modification of treatment by adding an additional week break to allow recovery rather than continue on the trajectory of further dose reduction She is in agreement I will move her next week's treatment to the following week I plan to order CT imaging next month for objective assessment of response to therapy  Anemia due to antineoplastic chemotherapy Her imaging anemia has improved with recent transfusion support She does not need blood transfusion today As above, we will modify her treatment plan so that she get an additional week of break to allow bone marrow recovery prior to day 8 of therapy  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    Answer:   [facility]    Order Specific Question:   Radiology Contrast Protocol - do NOT remove file path    Answer:   [email or web address]    All questions were answered. The patient knows to call the clinic with any problems, questions or concerns. The total time spent in the appointment was 40 minutes encounter with patients including review of chart and various tests results, discussions about plan of care and coordination of care plan   [name], MD 08/07/2022 9:21  AM  INTERVAL HISTORY: Please see below for problem oriented charting. she returns for treatment follow-up with her husband She had a tough time going through treatment recently She has excessive fatigue despite blood transfusion support Denies bleeding No progression of neuropathy She had intermittent mucositis but no nausea or vomiting  REVIEW OF SYSTEMS:   Constitutional: Denies fevers, chills or abnormal weight loss Eyes: Denies blurriness of vision Ears, nose, mouth, throat, and face: Denies mucositis or sore throat Respiratory: Denies cough, dyspnea or wheezes Cardiovascular: Denies palpitation, chest discomfort or lower extremity swelling Gastrointestinal:  Denies nausea, heartburn or change in bowel habits Skin: Denies abnormal skin rashes Lymphatics: Denies new lymphadenopathy or easy bruising Neurological:Denies numbness, tingling or new weaknesses Behavioral/Psych: Mood is stable, no new changes  All other systems were reviewed with the patient and are negative.  I have reviewed the past medical history, past surgical history, social history and family history with the patient and they are unchanged from previous note.  ALLERGIES:  has No Known Allergies.  MEDICATIONS:  Current Outpatient Medications  Medication Sig Dispense Refill   acetaminophen (TYLENOL) 500 MG tablet Take 500 mg by mouth every 6 (six) hours as needed for mild pain, moderate pain or fever.     aspirin 81 MG EC tablet Take by mouth daily.     cetirizine (ZYRTEC) 10 MG tablet Take 10 mg by mouth daily.     Cholecalciferol (VITAMIN D3 PO) Take 1 tablet by mouth daily.     lidocaine-prilocaine (EMLA) cream Apply 1 Application topically as needed. 30 g 3   losartan-hydrochlorothiazide (HYZAAR) 100-25  MG tablet Take 1 tablet by mouth daily. HOLD 8/31 per Dr. [name]     magic mouthwash (nystatin, lidocaine, diphenhydrAMINE, alum & mag hydroxide) suspension Swish and spit 5 mLs 4 (four) times daily for 7  days. 140 mL 0   omeprazole (PRILOSEC) 20 MG capsule Take 1 capsule (20 mg total) by mouth 2 (two) times daily. 28 capsule 0   No current facility-administered medications for this visit.   Facility-Administered Medications Ordered in Other Visits  Medication Dose Route Frequency Provider Last Rate Last Admin   0.9 %  sodium chloride infusion   Intravenous Once [name], Keghan Mcfarren, MD       gemcitabine (GEMZAR) 1,292 mg in sodium chloride 0.9 % 250 mL chemo infusion  600 mg/m2 (Treatment Plan Recorded) Intravenous Once [name], Cherrelle Plante, MD       heparin lock flush 100 unit/mL  500 Units Intracatheter Once PRN [name], Hazelene Doten, MD       sodium chloride flush (NS) 0.9 % injection 10 mL  10 mL Intracatheter PRN [name], MD        SUMMARY OF ONCOLOGIC HISTORY: Oncology History Overview Note  High grade and LMS, 50% ER/PR positive dMMR normal, PD-L1 CPS 3%   Uterine sarcoma ([place])  06/02/2021 Imaging   MRI pelvis Heterogeneous 9.0 cm intrauterine mass within the intramural/submucosal anterior fundus demonstrating interval growth, internal enhancement, and restricted diffusion 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 No extra uterine extension. No evidence of metastatic disease within the pelvis.   Mild thickening of the endometrial stripe, likely related to entrapment by the adjacent uterine mass   06/03/2021 Initial Diagnosis   Uterine sarcoma ([place])   06/03/2021 Cancer Staging   Staging form: Corpus Uteri - Sarcoma, AJCC 7th Edition - Clinical stage from 06/03/2021: FIGO Stage IVB (rT1b, N0, M1) - Signed by [name], MD on 03/31/2022 Diagnostic confirmation: Positive histology Stage prefix: Recurrence Biopsy of metastatic site performed: No Lymph-vascular invasion (LVI): LVI present/identified, NOS   06/03/2021 Pathology Results   FINAL MICROSCOPIC DIAGNOSIS:   A. UTERUS, CERVIX, BILATERAL FALLOPIAN TUBES AND OVARIES, HYSTERECTOMY:  - Uterus:       Mixed high  grade uterine sarcoma, spanning 6 cm, see comment.       Extensive lymphovascular invasion.       See oncology table.  - Cervix: Benign squamous and endocervical mucosa. No dysplasia or  malignancy.  - Bilateral ovaries: Unremarkable. No malignancy.  - Bilateral fallopian tubes: Unremarkable. No malignancy.   ONCOLOGY TABLE:   UTERUS, SARCOMA: Resection   Procedure: Total hysterectomy and bilateral salpingo-oophorectomy  Specimen Integrity: Focally disrupted on posterior surface  Tumor Site: Anterior wall  Tumor Size: 6 cm  Histologic Type: High grade sarcoma, mixed. See comment.  Other Tissue/ Organ Involvement: Not identified  Lymphovascular Invasion: Present, extensive.  Margins: All margins negative for tumor  Regional Lymph Nodes: Not applicable (no lymph nodes submitted or found)  Distant Metastasis:       Distant Site(s) Involved: Not applicable  Pathologic Stage Classification (pTNM, AJCC 8th Edition): pT1b, pN not  assigned  Ancillary Studies: Can be performed upon request  Representative Tumor Block: A6  Comment(s): The tumor has two morphologically different components. One component consists of malignant spindle cells which can be seen arising from the smooth muscle. These foci are positive for SMA, desmin, cyclinD1 (focal), and CD10 (patchy). The other component consists of round to slightly spindle cells with abundant admixed vessels and occasional large pleomorphic cells. These areas  are positive for SMA (patchy weak), desmin (focal), CD10 (variable with diffuse areas). Pancytokeratin is negative. 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   ADDENDUM:   PROGNOSTIC INDICATOR RESULTS:   Immunohistochemical and morphometric analysis performed manually    Estrogen Receptor:       POSITIVE, 50%, WEAK TO MODERATE STAINING  Progesterone Receptor:   POSITIVE, 50%, MODERATE TO STRONG STAINING    Reference Range Estrogen and Progesterone Receptor       Negative  0%       Positive  >1%    06/03/2021 Surgery   Surgeon: Donaciano Eva    Assistants: Dr Lahoma Crocker (an MD assistant was necessary for tissue manipulation, management of robotic instrumentation, retraction and positioning due to the complexity of the case and hospital policies).  Operation: Robotic-assisted laparoscopic total hysterectomy >250gm with bilateral salpingoophorectomy, minilaparotomy for specimen delivery   Surgeon: Donaciano Eva    Operative Findings:  : Bulky 16cm uterus with intra-uterine mass (consistent with a fibroid-like mass) , smooth normal appearing serosa, no suspicious bulky nodes. Normal ovaries bilaterally. Normal upper abdomen. Uterus too large to deliver vaginally in tact.    06/18/2021 Imaging   1. Multiple bilateral pulmonary nodules, measuring up to 9 mm. Most of these are perifissural and subpleural in location, likely lymph nodes. Nevertheless, close follow-up recommended to exclude metastatic disease. 2. 11 mm hypoattenuating lesion towards the dome of the liver, indeterminate. MRI abdomen with and without contrast could be used to further evaluate as clinically warranted. 3. Aortic Atherosclerosis (ICD10-I70.0).   10/13/2021 Imaging   1. No acute findings within the abdomen or pelvis. No specific findings identified to suggest residual or recurrence of tumor. 2. Stable small pulmonary nodules within the left lower lobe.   03/18/2022 Imaging   1. Interval development of 2 soft tissue nodules in the anterior pelvis measuring 10 mm and 7 mm respectively. Close follow-up recommended to exclude metastatic disease. PET-CT may be warranted to further evaluate. 2. Development of 2 mm left upper lobe parenchymal nodule, nonspecific. Close attention on follow-up imaging recommended  3. Stable appearance of index bilateral pulmonary nodules identified previously. 4. Aortic  Atherosclerosis (ICD10-I70.0).   04/02/2022 PET scan   1. Signs of local tumor recurrence noted at the vaginal cuff. 2. 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 New since 10/13/2021 and progressive when compared with 03/17/2022. 3. New focal area of increased uptake within the right biceps femoris muscle which is suspicious for skeletal muscle involvement. 4. Stable appearance of small pulmonary nodules described on 03/17/2022. These are too small to characterize by PET-CT.   04/09/2022 Procedure   Successful placement of a right IJ approach Power Port with ultrasound and fluoroscopic guidance. The catheter is ready for use.   04/09/2022 Echocardiogram    1. Left ventricular ejection fraction, by estimation, is 65 to 70%. The left ventricle has normal function. The left ventricle has no regional wall motion abnormalities. There is mild concentric left ventricular hypertrophy. Left ventricular diastolic parameters are consistent with Grade I diastolic dysfunction (impaired relaxation).  2. Right ventricular systolic function is normal. The right ventricular size is normal.  3. The mitral valve is normal in structure. No evidence of mitral valve regurgitation.  4. The aortic valve is tricuspid. Aortic valve regurgitation is not visualized. No aortic stenosis is present.  5. The inferior vena cava is normal in size with greater than 50% respiratory variability,  suggesting right atrial pressure of 3 mmHg.   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 Status post hysterectomy and bilateral oophorectomy. Multiple newand enlarged peritoneal nodules throughout the low abdomen and pelvis, previously FDG avid and consistent with worsened locally recurrent and peritoneal metastatic disease. 2. Multiple small bilateral pulmonary nodules, several of which are slightly  enlarged compared to prior examination, consistent with worsened pulmonary metastatic disease.   Aortic Atherosclerosis (ICD10-I70.0).     06/25/2022 - 07/06/2022 Chemotherapy   Patient is on Treatment Plan : UTERINE UNDIFFERENTIATED / LEIOMYOSARCOMA Gemcitabine D1,8 + Docetaxel D8 (900/100) q21d     06/25/2022 -  Chemotherapy   Patient is on Treatment Plan : UTERINE UNDIFFERENTIATED LEIOMYOSARCOMA Gemcitabine D1,8 + Docetaxel D8 (900/100) q21d       PHYSICAL EXAMINATION: ECOG PERFORMANCE STATUS: 1 - Symptomatic but completely ambulatory  Vitals:   08/07/22 0808  BP: (!) 143/65  Pulse: (!) 113  Resp: 14  Temp: 97.9 F (36.6 C)  SpO2: 100%   Filed Weights   08/07/22 0808  Weight: 211 lb 4.8 oz (95.8 kg)    GENERAL:alert, no distress and comfortable.  She looks pale NEURO: alert & oriented x 3 with fluent speech, no focal motor/sensory deficits  LABORATORY DATA:  I have reviewed the data as listed    Component Value Date/Time   NA 135 08/07/2022 0720   K 3.5 08/07/2022 0720   CL 99 08/07/2022 0720   CO2 29 08/07/2022 0720   GLUCOSE 146 (H) 08/07/2022 0720   BUN 11 08/07/2022 0720   CREATININE 0.76 08/07/2022 0720   CALCIUM 8.7 (L) 08/07/2022 0720   PROT 6.0 (L) 08/07/2022 0720   ALBUMIN 3.5 08/07/2022 0720   AST 27 08/07/2022 0720   ALT 23 08/07/2022 0720   ALKPHOS 87 08/07/2022 0720   BILITOT 0.4 08/07/2022 0720   GFRNONAA >60 08/07/2022 0720    No results found for: "SPEP", "UPEP"  Lab Results  Component Value Date   WBC 22.1 (H) 08/07/2022   NEUTROABS 14.5 (H) 08/07/2022   HGB 9.5 (L) 08/07/2022   HCT 28.3 (L) 08/07/2022   MCV 90.4 08/07/2022   PLT 680 (H) 08/07/2022      Chemistry      Component Value Date/Time   NA 135 08/07/2022 0720   K 3.5 08/07/2022 0720   CL 99 08/07/2022 0720   CO2 29 08/07/2022 0720   BUN 11 08/07/2022 0720   CREATININE 0.76 08/07/2022 0720      Component Value Date/Time   CALCIUM 8.7 (L) 08/07/2022 0720   ALKPHOS  87 08/07/2022 0720   AST 27 08/07/2022 0720   ALT 23 08/07/2022 0720   BILITOT 0.4 08/07/2022 0720

## 2022-08-08 ENCOUNTER — Other Ambulatory Visit: Payer: Self-pay

## 2022-08-13 ENCOUNTER — Other Ambulatory Visit: Payer: Managed Care, Other (non HMO)

## 2022-08-13 ENCOUNTER — Ambulatory Visit: Payer: Managed Care, Other (non HMO)

## 2022-08-15 ENCOUNTER — Ambulatory Visit: Payer: Managed Care, Other (non HMO)

## 2022-08-17 ENCOUNTER — Other Ambulatory Visit: Payer: Self-pay

## 2022-08-20 MED FILL — Dexamethasone Sodium Phosphate Inj 100 MG/10ML: INTRAMUSCULAR | Qty: 1 | Status: AC

## 2022-08-21 ENCOUNTER — Inpatient Hospital Stay: Payer: Managed Care, Other (non HMO)

## 2022-08-21 ENCOUNTER — Other Ambulatory Visit: Payer: Self-pay

## 2022-08-21 ENCOUNTER — Inpatient Hospital Stay: Payer: Managed Care, Other (non HMO) | Attending: Gynecologic Oncology

## 2022-08-21 VITALS — BP 121/80 | HR 94 | Temp 97.9°F | Resp 17 | Wt 211.2 lb

## 2022-08-21 DIAGNOSIS — Z5189 Encounter for other specified aftercare: Secondary | ICD-10-CM | POA: Diagnosis not present

## 2022-08-21 DIAGNOSIS — Z5111 Encounter for antineoplastic chemotherapy: Secondary | ICD-10-CM | POA: Diagnosis present

## 2022-08-21 DIAGNOSIS — C549 Malignant neoplasm of corpus uteri, unspecified: Secondary | ICD-10-CM

## 2022-08-21 LAB — CBC WITH DIFFERENTIAL (CANCER CENTER ONLY)
Abs Immature Granulocytes: 0.07 10*3/uL (ref 0.00–0.07)
Basophils Absolute: 0.1 10*3/uL (ref 0.0–0.1)
Basophils Relative: 1 %
Eosinophils Absolute: 0.2 10*3/uL (ref 0.0–0.5)
Eosinophils Relative: 3 %
HCT: 28.3 % — ABNORMAL LOW (ref 36.0–46.0)
Hemoglobin: 9.5 g/dL — ABNORMAL LOW (ref 12.0–15.0)
Immature Granulocytes: 1 %
Lymphocytes Relative: 25 %
Lymphs Abs: 1.6 10*3/uL (ref 0.7–4.0)
MCH: 31.3 pg (ref 26.0–34.0)
MCHC: 33.6 g/dL (ref 30.0–36.0)
MCV: 93.1 fL (ref 80.0–100.0)
Monocytes Absolute: 1.1 10*3/uL — ABNORMAL HIGH (ref 0.1–1.0)
Monocytes Relative: 18 %
Neutro Abs: 3.3 10*3/uL (ref 1.7–7.7)
Neutrophils Relative %: 52 %
Platelet Count: 221 10*3/uL (ref 150–400)
RBC: 3.04 MIL/uL — ABNORMAL LOW (ref 3.87–5.11)
RDW: 22.9 % — ABNORMAL HIGH (ref 11.5–15.5)
WBC Count: 6.3 10*3/uL (ref 4.0–10.5)
nRBC: 0 % (ref 0.0–0.2)

## 2022-08-21 LAB — CMP (CANCER CENTER ONLY)
ALT: 41 U/L (ref 0–44)
AST: 42 U/L — ABNORMAL HIGH (ref 15–41)
Albumin: 3.5 g/dL (ref 3.5–5.0)
Alkaline Phosphatase: 71 U/L (ref 38–126)
Anion gap: 8 (ref 5–15)
BUN: 11 mg/dL (ref 8–23)
CO2: 28 mmol/L (ref 22–32)
Calcium: 9.1 mg/dL (ref 8.9–10.3)
Chloride: 101 mmol/L (ref 98–111)
Creatinine: 0.64 mg/dL (ref 0.44–1.00)
GFR, Estimated: >60 mL/min (ref >60)
Glucose, Bld: 108 mg/dL — ABNORMAL HIGH (ref 70–99)
Potassium: 3.5 mmol/L (ref 3.5–5.1)
Sodium: 137 mmol/L (ref 135–145)
Total Bilirubin: 0.5 mg/dL (ref 0.3–1.2)
Total Protein: 6.3 g/dL — ABNORMAL LOW (ref 6.5–8.1)

## 2022-08-21 MED ORDER — HEPARIN SOD (PORK) LOCK FLUSH 100 UNIT/ML IV SOLN
500.0000 [IU] | Freq: Once | INTRAVENOUS | Status: AC | PRN
  Administered 2022-08-21: 500 [IU]

## 2022-08-21 MED ORDER — SODIUM CHLORIDE 0.9% FLUSH
10.0000 mL | Freq: Once | INTRAVENOUS | Status: AC
  Administered 2022-08-21: 10 mL

## 2022-08-21 MED ORDER — SODIUM CHLORIDE 0.9% FLUSH
10.0000 mL | INTRAVENOUS | Status: DC | PRN
  Administered 2022-08-21: 10 mL

## 2022-08-21 MED ORDER — PROCHLORPERAZINE MALEATE 10 MG PO TABS
10.0000 mg | ORAL_TABLET | Freq: Once | ORAL | Status: AC
  Administered 2022-08-21: 10 mg via ORAL
  Filled 2022-08-21: qty 1

## 2022-08-21 MED ORDER — SODIUM CHLORIDE 0.9 % IV SOLN
600.0000 mg/m2 | Freq: Once | INTRAVENOUS | Status: AC
  Administered 2022-08-21: 1292 mg via INTRAVENOUS
  Filled 2022-08-21: qty 33.98

## 2022-08-21 MED ORDER — SODIUM CHLORIDE 0.9 % IV SOLN
10.0000 mg | Freq: Once | INTRAVENOUS | Status: AC
  Administered 2022-08-21: 10 mg via INTRAVENOUS
  Filled 2022-08-21: qty 10

## 2022-08-21 MED ORDER — SODIUM CHLORIDE 0.9 % IV SOLN
75.0000 mg/m2 | Freq: Once | INTRAVENOUS | Status: AC
  Administered 2022-08-21: 160 mg via INTRAVENOUS
  Filled 2022-08-21: qty 16

## 2022-08-21 MED ORDER — SODIUM CHLORIDE 0.9 % IV SOLN: Freq: Once | INTRAVENOUS | Status: AC

## 2022-08-21 NOTE — Patient Instructions (Signed)
[facility] ONCOLOGY  Discharge Instructions: Thank you for choosing [facility] to provide your oncology and hematology care.   If you have a lab appointment with the [facility], please go directly to the [facility] and check in at the registration area.   Wear comfortable clothing and clothing appropriate for easy access to any Portacath or PICC line.   We strive to give you quality time with your provider. You may need to reschedule your appointment if you arrive late (15 or more minutes).  Arriving late affects you and other patients whose appointments are after yours.  Also, if you miss three or more appointments without notifying the office, you may be dismissed from the clinic at the provider's discretion.      For prescription refill requests, have your pharmacy contact our office and allow 72 hours for refills to be completed.    Today you received the following chemotherapy and/or immunotherapy agents: docetaxel, gemcitabine      To help prevent nausea and vomiting after your treatment, we encourage you to take your nausea medication as directed.  BELOW ARE SYMPTOMS THAT SHOULD BE REPORTED IMMEDIATELY: *FEVER GREATER THAN 100.4 F (38 C) OR HIGHER *CHILLS OR SWEATING *NAUSEA AND VOMITING THAT IS NOT CONTROLLED WITH YOUR NAUSEA MEDICATION *UNUSUAL SHORTNESS OF BREATH *UNUSUAL BRUISING OR BLEEDING *URINARY PROBLEMS (pain or burning when urinating, or frequent urination) *BOWEL PROBLEMS (unusual diarrhea, constipation, pain near the anus) TENDERNESS IN MOUTH AND THROAT WITH OR WITHOUT PRESENCE OF ULCERS (sore throat, sores in mouth, or a toothache) UNUSUAL RASH, SWELLING OR PAIN  UNUSUAL VAGINAL DISCHARGE OR ITCHING   Items with * indicate a potential emergency and should be followed up as soon as possible or go to the Emergency Department if any problems should occur.  Please show the CHEMOTHERAPY ALERT CARD or IMMUNOTHERAPY ALERT CARD at  check-in to the Emergency Department and triage nurse.  Should you have questions after your visit or need to cancel or reschedule your appointment, please contact [facility]  Dept: [phone]  and follow the prompts.  Office hours are 8:00 a.m. to 4:30 p.m. Monday - Friday. Please note that voicemails left after 4:00 p.m. may not be returned until the following business day.  We are closed weekends and major holidays. You have access to a nurse at all times for urgent questions. Please call the main number to the clinic Dept: [phone] and follow the prompts.   For any non-urgent questions, you may also contact your provider using MyChart. We now offer e-Visits for anyone 81 and older to request care online for non-urgent symptoms. For details visit [email or web address].   Also download the MyChart app! Go to the app store, search "MyChart", open the app, select [facility], and log in with your MyChart username and password.  Masks are optional in the cancer centers. If you would like for your care team to wear a mask while they are taking care of you, please let them know. You may have one support person who is at least 63 years old accompany you for your appointments.

## 2022-08-21 NOTE — Progress Notes (Signed)
Per Dr. [name] OK to proceed w/ premedications today prior to [facility] resulting.

## 2022-08-22 ENCOUNTER — Inpatient Hospital Stay: Payer: Managed Care, Other (non HMO)

## 2022-08-22 VITALS — BP 131/73 | HR 108 | Temp 98.2°F | Resp 18

## 2022-08-22 DIAGNOSIS — Z5111 Encounter for antineoplastic chemotherapy: Secondary | ICD-10-CM | POA: Diagnosis not present

## 2022-08-22 DIAGNOSIS — C549 Malignant neoplasm of corpus uteri, unspecified: Secondary | ICD-10-CM

## 2022-08-22 MED ORDER — PEGFILGRASTIM INJECTION 6 MG/0.6ML ~~LOC~~
6.0000 mg | PREFILLED_SYRINGE | Freq: Once | SUBCUTANEOUS | Status: AC
  Administered 2022-08-22: 6 mg via SUBCUTANEOUS

## 2022-08-24 ENCOUNTER — Inpatient Hospital Stay: Payer: Managed Care, Other (non HMO)

## 2022-08-27 ENCOUNTER — Other Ambulatory Visit: Payer: Managed Care, Other (non HMO)

## 2022-08-27 ENCOUNTER — Ambulatory Visit: Payer: Managed Care, Other (non HMO)

## 2022-08-27 ENCOUNTER — Ambulatory Visit: Payer: Managed Care, Other (non HMO) | Admitting: Hematology and Oncology

## 2022-08-31 ENCOUNTER — Other Ambulatory Visit: Payer: Self-pay | Admitting: Hematology and Oncology

## 2022-08-31 ENCOUNTER — Telehealth: Payer: Self-pay | Admitting: *Deleted

## 2022-08-31 DIAGNOSIS — C549 Malignant neoplasm of corpus uteri, unspecified: Secondary | ICD-10-CM

## 2022-08-31 MED ORDER — LORAZEPAM 0.5 MG PO TABS: 0.5000 mg | ORAL_TABLET | Freq: Two times a day (BID) | ORAL | 0 refills | Status: DC | PRN

## 2022-08-31 NOTE — Telephone Encounter (Signed)
Ms. Leverett called -  has CT scheduled AM 10/17. She said she's claustrophobic and knows it isn't bad, but is still anxious. She wants to know if Dr. [name] could prescribe anything to help her relax and stay calm for CT?   Message given to Dr. [name].  Contacted patient with Dr. Calton Dach response:  3 tabs lorazepam sent to her local pharmacy  She can take 1 tab 1 hour before and repeat another one 30 mins prior to CT  She cannot drive home if she takes Lorazepam   Patient verbalized understanding and states her husband will be driver and accompany her to all appts tomorrow.

## 2022-09-01 ENCOUNTER — Inpatient Hospital Stay: Payer: Managed Care, Other (non HMO)

## 2022-09-01 ENCOUNTER — Ambulatory Visit (HOSPITAL_COMMUNITY)
Admission: RE | Admit: 2022-09-01 | Discharge: 2022-09-01 | Disposition: A | Discharge status: Home / Self Care | Payer: Managed Care, Other (non HMO) | Source: Ambulatory Visit | Attending: Hematology and Oncology | Admitting: Hematology and Oncology

## 2022-09-01 DIAGNOSIS — C549 Malignant neoplasm of corpus uteri, unspecified: Secondary | ICD-10-CM

## 2022-09-01 LAB — CBC WITH DIFFERENTIAL (CANCER CENTER ONLY)
Abs Immature Granulocytes: 4.6 10*3/uL — ABNORMAL HIGH (ref 0.00–0.07)
Basophils Absolute: 0.1 10*3/uL (ref 0.0–0.1)
Basophils Relative: 0 %
Eosinophils Absolute: 0.2 10*3/uL (ref 0.0–0.5)
Eosinophils Relative: 1 %
HCT: 30.4 % — ABNORMAL LOW (ref 36.0–46.0)
Hemoglobin: 10.5 g/dL — ABNORMAL LOW (ref 12.0–15.0)
Immature Granulocytes: 11 %
Lymphocytes Relative: 6 %
Lymphs Abs: 2.5 10*3/uL (ref 0.7–4.0)
MCH: 32.2 pg (ref 26.0–34.0)
MCHC: 34.5 g/dL (ref 30.0–36.0)
MCV: 93.3 fL (ref 80.0–100.0)
Monocytes Absolute: 3.2 10*3/uL — ABNORMAL HIGH (ref 0.1–1.0)
Monocytes Relative: 8 %
Neutro Abs: 30 10*3/uL — ABNORMAL HIGH (ref 1.7–7.7)
Neutrophils Relative %: 74 %
Platelet Count: 159 10*3/uL (ref 150–400)
RBC: 3.26 MIL/uL — ABNORMAL LOW (ref 3.87–5.11)
RDW: 22 % — ABNORMAL HIGH (ref 11.5–15.5)
WBC Count: 40.6 10*3/uL — ABNORMAL HIGH (ref 4.0–10.5)
nRBC: 0.1 % (ref 0.0–0.2)

## 2022-09-01 LAB — CMP (CANCER CENTER ONLY)
ALT: 25 U/L (ref 0–44)
AST: 25 U/L (ref 15–41)
Albumin: 3.5 g/dL (ref 3.5–5.0)
Alkaline Phosphatase: 166 U/L — ABNORMAL HIGH (ref 38–126)
Anion gap: 9 (ref 5–15)
BUN: 9 mg/dL (ref 8–23)
CO2: 28 mmol/L (ref 22–32)
Calcium: 8.9 mg/dL (ref 8.9–10.3)
Chloride: 97 mmol/L — ABNORMAL LOW (ref 98–111)
Creatinine: 0.85 mg/dL (ref 0.44–1.00)
GFR, Estimated: >60 mL/min (ref >60)
Glucose, Bld: 124 mg/dL — ABNORMAL HIGH (ref 70–99)
Potassium: 3.4 mmol/L — ABNORMAL LOW (ref 3.5–5.1)
Sodium: 134 mmol/L — ABNORMAL LOW (ref 135–145)
Total Bilirubin: 0.5 mg/dL (ref 0.3–1.2)
Total Protein: 6.2 g/dL — ABNORMAL LOW (ref 6.5–8.1)

## 2022-09-01 MED ORDER — SODIUM CHLORIDE 0.9% FLUSH
10.0000 mL | Freq: Once | INTRAVENOUS | Status: AC
  Administered 2022-09-01: 10 mL

## 2022-09-01 MED ORDER — HEPARIN SOD (PORK) LOCK FLUSH 100 UNIT/ML IV SOLN
INTRAVENOUS | Status: AC
  Administered 2022-09-01: 500 [IU]
  Filled 2022-09-01: qty 5

## 2022-09-01 MED ORDER — IOHEXOL 300 MG/ML  SOLN
75.0000 mL | Freq: Once | INTRAMUSCULAR | Status: AC | PRN
  Administered 2022-09-01: 75 mL via INTRAVENOUS

## 2022-09-01 MED ORDER — SODIUM CHLORIDE (PF) 0.9 % IJ SOLN
INTRAMUSCULAR | Status: AC
  Filled 2022-09-01: qty 50

## 2022-09-03 ENCOUNTER — Ambulatory Visit: Payer: Managed Care, Other (non HMO)

## 2022-09-03 ENCOUNTER — Other Ambulatory Visit: Payer: Managed Care, Other (non HMO)

## 2022-09-03 ENCOUNTER — Encounter: Payer: Self-pay | Admitting: Hematology and Oncology

## 2022-09-03 ENCOUNTER — Inpatient Hospital Stay: Payer: Managed Care, Other (non HMO)

## 2022-09-03 ENCOUNTER — Inpatient Hospital Stay (HOSPITAL_BASED_OUTPATIENT_CLINIC_OR_DEPARTMENT_OTHER): Payer: Managed Care, Other (non HMO) | Admitting: Hematology and Oncology

## 2022-09-03 VITALS — BP 141/67 | HR 131 | Temp 97.9°F | Resp 18 | Ht 67.5 in | Wt 208.0 lb

## 2022-09-03 VITALS — HR 107

## 2022-09-03 DIAGNOSIS — C549 Malignant neoplasm of corpus uteri, unspecified: Secondary | ICD-10-CM

## 2022-09-03 DIAGNOSIS — Z5111 Encounter for antineoplastic chemotherapy: Secondary | ICD-10-CM | POA: Diagnosis not present

## 2022-09-03 DIAGNOSIS — T451X5A Adverse effect of antineoplastic and immunosuppressive drugs, initial encounter: Secondary | ICD-10-CM | POA: Diagnosis not present

## 2022-09-03 DIAGNOSIS — D6481 Anemia due to antineoplastic chemotherapy: Secondary | ICD-10-CM | POA: Diagnosis not present

## 2022-09-03 MED ORDER — SODIUM CHLORIDE 0.9 % IV SOLN
600.0000 mg/m2 | Freq: Once | INTRAVENOUS | Status: AC
  Administered 2022-09-03: 1292 mg via INTRAVENOUS
  Filled 2022-09-03: qty 33.98

## 2022-09-03 MED ORDER — SODIUM CHLORIDE 0.9 % IV SOLN: Freq: Once | INTRAVENOUS | Status: AC

## 2022-09-03 MED ORDER — PROCHLORPERAZINE MALEATE 10 MG PO TABS
10.0000 mg | ORAL_TABLET | Freq: Once | ORAL | Status: AC
  Administered 2022-09-03: 10 mg via ORAL
  Filled 2022-09-03: qty 1

## 2022-09-03 MED ORDER — SODIUM CHLORIDE 0.9% FLUSH
10.0000 mL | INTRAVENOUS | Status: DC | PRN
  Administered 2022-09-03: 10 mL

## 2022-09-03 MED ORDER — DEXAMETHASONE 4 MG PO TABS: ORAL_TABLET | ORAL | 0 refills | Status: DC

## 2022-09-03 MED ORDER — ONDANSETRON HCL 8 MG PO TABS: 8.0000 mg | ORAL_TABLET | Freq: Three times a day (TID) | ORAL | 3 refills | Status: DC | PRN

## 2022-09-03 MED ORDER — HEPARIN SOD (PORK) LOCK FLUSH 100 UNIT/ML IV SOLN
500.0000 [IU] | Freq: Once | INTRAVENOUS | Status: AC | PRN
  Administered 2022-09-03: 500 [IU]

## 2022-09-03 NOTE — Assessment & Plan Note (Signed)
Her imaging anemia has improved with recent changes to her treatment plan She does not need blood transfusion today As above, we will continue her treatment with an additional week of break to allow bone marrow recovery prior to day 8 of therapy

## 2022-09-03 NOTE — Progress Notes (Signed)
Per Dr [name], ok to proceed with elevated HR of 107 today

## 2022-09-03 NOTE — Progress Notes (Signed)
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[facility]) I have reviewed imaging study with the patient and her husband She has overall significant positive response to treatment Her side effects are manageable with recent changes in the dose and the timing of her treatment I plan to continue treatment for another 3 months I plan to repeat imaging study again in January  Anemia due to antineoplastic chemotherapy Her imaging anemia has improved with recent changes to her treatment plan She does not need blood transfusion today As above, we will continue her treatment with an additional week of break to allow bone marrow recovery prior to day 8 of therapy  No orders of the defined types were placed in this encounter.   All questions were answered. The patient knows to call the clinic with any problems, questions or concerns. The total time spent in the appointment was 30 minutes encounter with patients including review of chart and various tests results, discussions about plan of care and coordination of care plan   [name], MD 09/03/2022 9:15 AM  INTERVAL HISTORY: Please see below for problem oriented charting. she returns for treatment follow-up with her husband and review of test results She tolerated recent treatment well She continues to have fatigue but denies shortness of breath No recent infection No worsening peripheral neuropathy  REVIEW OF SYSTEMS:   Constitutional: Denies fevers, chills or abnormal weight loss Eyes: Denies blurriness of vision Ears, nose, mouth, throat, and face: Denies mucositis or sore throat Respiratory: Denies cough, dyspnea or wheezes Cardiovascular: Denies palpitation, chest discomfort or lower extremity  swelling Gastrointestinal:  Denies nausea, heartburn or change in bowel habits Skin: Denies abnormal skin rashes Lymphatics: Denies new lymphadenopathy or easy bruising Neurological:Denies numbness, tingling or new weaknesses Behavioral/Psych: Mood is stable, no new changes  All other systems were reviewed with the patient and are negative.  I have reviewed the past medical history, past surgical history, social history and family history with the patient and they are unchanged from previous note.  ALLERGIES:  has No Known Allergies.  MEDICATIONS:  Current Outpatient Medications  Medication Sig Dispense Refill   dexamethasone (DECADRON) 4 MG tablet Take 2 pills the day before chemo and daily for 2 days after chemo 30 tablet 0   LORazepam (ATIVAN) 0.5 MG tablet Take 1 tablet (0.5 mg total) by mouth 2 (two) times daily as needed for anxiety. 3 tablet 0   ondansetron (ZOFRAN) 8 MG tablet Take 1 tablet (8 mg total) by mouth every 8 (eight) hours as needed for nausea. 30 tablet 3   acetaminophen (TYLENOL) 500 MG tablet Take 500 mg by mouth every 6 (six) hours as needed for mild pain, moderate pain or fever.     aspirin 81 MG EC tablet Take by mouth daily.     cetirizine (ZYRTEC) 10 MG tablet Take 10 mg by mouth daily.     Cholecalciferol (VITAMIN D3 PO) Take 1 tablet by mouth daily.     lidocaine-prilocaine (EMLA) cream Apply 1 Application topically as needed. 30 g 3   losartan-hydrochlorothiazide (HYZAAR) 100-25 MG tablet Take 1 tablet by mouth daily. HOLD 8/31 per Dr. [name]     omeprazole (PRILOSEC) 20 MG capsule Take 1 capsule (20 mg total) by mouth 2 (two) times daily. 28 capsule  0   No current facility-administered medications for this visit.   Facility-Administered Medications Ordered in Other Visits  Medication Dose Route Frequency Provider Last Rate Last Admin   gemcitabine (GEMZAR) 1,292 mg in sodium chloride 0.9 % 250 mL chemo infusion  600 mg/m2 (Treatment Plan Recorded)  Intravenous Once [name], MD        SUMMARY OF ONCOLOGIC HISTORY: Oncology History Overview Note  High grade and LMS, 50% ER/PR positive dMMR normal, PD-L1 CPS 3%   Uterine sarcoma ([place])  06/02/2021 Imaging   MRI pelvis Heterogeneous 9.0 cm intrauterine mass within the intramural/submucosal anterior fundus demonstrating interval growth, internal enhancement, and restricted diffusion 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 No extra uterine extension. No evidence of metastatic disease within the pelvis.   Mild thickening of the endometrial stripe, likely related to entrapment by the adjacent uterine mass   06/03/2021 Initial Diagnosis   Uterine sarcoma ([place])   06/03/2021 Cancer Staging   Staging form: Corpus Uteri - Sarcoma, AJCC 7th Edition - Clinical stage from 06/03/2021: FIGO Stage IVB (rT1b, N0, M1) - Signed by [name], MD on 03/31/2022 Diagnostic confirmation: Positive histology Stage prefix: Recurrence Biopsy of metastatic site performed: No Lymph-vascular invasion (LVI): LVI present/identified, NOS   06/03/2021 Pathology Results   FINAL MICROSCOPIC DIAGNOSIS:   A. UTERUS, CERVIX, BILATERAL FALLOPIAN TUBES AND OVARIES, HYSTERECTOMY:  - Uterus:       Mixed high grade uterine sarcoma, spanning 6 cm, see comment.       Extensive lymphovascular invasion.       See oncology table.  - Cervix: Benign squamous and endocervical mucosa. No dysplasia or  malignancy.  - Bilateral ovaries: Unremarkable. No malignancy.  - Bilateral fallopian tubes: Unremarkable. No malignancy.   ONCOLOGY TABLE:   UTERUS, SARCOMA: Resection   Procedure: Total hysterectomy and bilateral salpingo-oophorectomy  Specimen Integrity: Focally disrupted on posterior surface  Tumor Site: Anterior wall  Tumor Size: 6 cm  Histologic Type: High grade sarcoma, mixed. See comment.  Other Tissue/ Organ Involvement: Not identified  Lymphovascular Invasion: Present,  extensive.  Margins: All margins negative for tumor  Regional Lymph Nodes: Not applicable (no lymph nodes submitted or found)  Distant Metastasis:       Distant Site(s) Involved: Not applicable  Pathologic Stage Classification (pTNM, AJCC 8th Edition): pT1b, pN not  assigned  Ancillary Studies: Can be performed upon request  Representative Tumor Block: A6  Comment(s): The tumor has two morphologically different components. One component consists of malignant spindle cells which can be seen arising from the smooth muscle. These foci are positive for SMA, desmin, cyclinD1 (focal), and CD10 (patchy). The other component consists of round to slightly spindle cells with abundant admixed vessels and occasional large pleomorphic cells. These areas are positive for SMA (patchy weak), desmin (focal), CD10 (variable with diffuse areas). Pancytokeratin is negative. 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   ADDENDUM:   PROGNOSTIC INDICATOR RESULTS:   Immunohistochemical and morphometric analysis performed manually    Estrogen Receptor:       POSITIVE, 50%, WEAK TO MODERATE STAINING  Progesterone Receptor:   POSITIVE, 50%, MODERATE TO STRONG STAINING   Reference Range Estrogen and Progesterone Receptor       Negative  0%       Positive  >1%    06/03/2021 Surgery   Surgeon: Donaciano Eva    Assistants: Dr Lahoma Crocker (an MD assistant was necessary  for tissue manipulation, management of robotic instrumentation, retraction and positioning due to the complexity of the case and hospital policies).  Operation: Robotic-assisted laparoscopic total hysterectomy >250gm with bilateral salpingoophorectomy, minilaparotomy for specimen delivery   Surgeon: Donaciano Eva    Operative Findings:  : Bulky 16cm uterus with intra-uterine mass (consistent with a fibroid-like mass) , smooth normal appearing serosa, no  suspicious bulky nodes. Normal ovaries bilaterally. Normal upper abdomen. Uterus too large to deliver vaginally in tact.    06/18/2021 Imaging   1. Multiple bilateral pulmonary nodules, measuring up to 9 mm. Most of these are perifissural and subpleural in location, likely lymph nodes. Nevertheless, close follow-up recommended to exclude metastatic disease. 2. 11 mm hypoattenuating lesion towards the dome of the liver, indeterminate. MRI abdomen with and without contrast could be used to further evaluate as clinically warranted. 3. Aortic Atherosclerosis (ICD10-I70.0).   10/13/2021 Imaging   1. No acute findings within the abdomen or pelvis. No specific findings identified to suggest residual or recurrence of tumor. 2. Stable small pulmonary nodules within the left lower lobe.   03/18/2022 Imaging   1. Interval development of 2 soft tissue nodules in the anterior pelvis measuring 10 mm and 7 mm respectively. Close follow-up recommended to exclude metastatic disease. PET-CT may be warranted to further evaluate. 2. Development of 2 mm left upper lobe parenchymal nodule, nonspecific. Close attention on follow-up imaging recommended  3. Stable appearance of index bilateral pulmonary nodules identified previously. 4. Aortic Atherosclerosis (ICD10-I70.0).   04/02/2022 PET scan   1. Signs of local tumor recurrence noted at the vaginal cuff. 2. 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 New since 10/13/2021 and progressive when compared with 03/17/2022. 3. New focal area of increased uptake within the right biceps femoris muscle which is suspicious for skeletal muscle involvement. 4. Stable appearance of small pulmonary nodules described on 03/17/2022. These are too small to characterize by PET-CT.   04/09/2022 Procedure   Successful placement of a right IJ approach Power Port with ultrasound and fluoroscopic guidance. The catheter is ready for use.   04/09/2022  Echocardiogram    1. Left ventricular ejection fraction, by estimation, is 65 to 70%. The left ventricle has normal function. The left ventricle has no regional wall motion abnormalities. There is mild concentric left ventricular hypertrophy. Left ventricular diastolic parameters are consistent with Grade I diastolic dysfunction (impaired relaxation).  2. Right ventricular systolic function is normal. The right ventricular size is normal.  3. The mitral valve is normal in structure. No evidence of mitral valve regurgitation.  4. The aortic valve is tricuspid. Aortic valve regurgitation is not visualized. No aortic stenosis is present.  5. The inferior vena cava is normal in size with greater than 50% respiratory variability, suggesting right atrial pressure of 3 mmHg.   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 Status post hysterectomy and bilateral oophorectomy. Multiple newand enlarged peritoneal nodules throughout the low abdomen and pelvis, previously FDG avid and consistent with worsened locally recurrent and peritoneal metastatic disease. 2. Multiple small bilateral pulmonary nodules, several of which are slightly enlarged compared to prior examination, consistent with worsened pulmonary metastatic disease.   Aortic Atherosclerosis (ICD10-I70.0).     06/25/2022 - 07/06/2022 Chemotherapy   Patient is on Treatment Plan : UTERINE UNDIFFERENTIATED / LEIOMYOSARCOMA Gemcitabine D1,8 + Docetaxel D8 (900/100) q21d  06/25/2022 -  Chemotherapy   Patient is on Treatment Plan : UTERINE UNDIFFERENTIATED LEIOMYOSARCOMA Gemcitabine D1,8 + Docetaxel D8 (900/100) q21d     09/02/2022 Imaging   1. Moderate response to therapy of peritoneal metastasis within the anterior pelvis. 2. Minimal response to therapy of pulmonary metastasis. 3. No new or progressive disease. 4.  Aortic Atherosclerosis  (ICD10-I70.0).       PHYSICAL EXAMINATION: ECOG PERFORMANCE STATUS: 1 - Symptomatic but completely ambulatory  Vitals:   09/03/22 0757  BP: (!) 141/67  Pulse: (!) 131  Resp: 18  Temp: 97.9 F (36.6 C)  SpO2: 100%   Filed Weights   09/03/22 0757  Weight: 208 lb (94.3 kg)    GENERAL:alert, no distress and comfortable NEURO: alert & oriented x 3 with fluent speech, no focal motor/sensory deficits  LABORATORY DATA:  I have reviewed the data as listed    Component Value Date/Time   NA 134 (L) 09/01/2022 0744   K 3.4 (L) 09/01/2022 0744   CL 97 (L) 09/01/2022 0744   CO2 28 09/01/2022 0744   GLUCOSE 124 (H) 09/01/2022 0744   BUN 9 09/01/2022 0744   CREATININE 0.85 09/01/2022 0744   CALCIUM 8.9 09/01/2022 0744   PROT 6.2 (L) 09/01/2022 0744   ALBUMIN 3.5 09/01/2022 0744   AST 25 09/01/2022 0744   ALT 25 09/01/2022 0744   ALKPHOS 166 (H) 09/01/2022 0744   BILITOT 0.5 09/01/2022 0744   GFRNONAA >60 09/01/2022 0744    No results found for: "SPEP", "UPEP"  Lab Results  Component Value Date   WBC 40.6 (H) 09/01/2022   NEUTROABS 30.0 (H) 09/01/2022   HGB 10.5 (L) 09/01/2022   HCT 30.4 (L) 09/01/2022   MCV 93.3 09/01/2022   PLT 159 09/01/2022      Chemistry      Component Value Date/Time   NA 134 (L) 09/01/2022 0744   K 3.4 (L) 09/01/2022 0744   CL 97 (L) 09/01/2022 0744   CO2 28 09/01/2022 0744   BUN 9 09/01/2022 0744   CREATININE 0.85 09/01/2022 0744      Component Value Date/Time   CALCIUM 8.9 09/01/2022 0744   ALKPHOS 166 (H) 09/01/2022 0744   AST 25 09/01/2022 0744   ALT 25 09/01/2022 0744   BILITOT 0.5 09/01/2022 0744       RADIOGRAPHIC STUDIES: I have reviewed multiple imaging studies with the patient I have personally reviewed the radiological images as listed and agreed with the findings in the report. CT CHEST ABDOMEN PELVIS W CONTRAST  Result Date: 09/01/2022 CLINICAL DATA:  Uterine/cervical cancer. Evaluate treatment response. Uterine  sarcoma. * Tracking Code: BO * EXAM: CT CHEST, ABDOMEN, AND PELVIS WITH CONTRAST TECHNIQUE: Multidetector CT imaging of the chest, abdomen and pelvis was performed following the standard protocol during bolus administration of intravenous contrast. RADIATION DOSE REDUCTION: This exam was performed according to the departmental dose-optimization program which includes automated exposure control, adjustment of the mA and/or kV according to patient size and/or use of iterative reconstruction technique. CONTRAST:  68m OMNIPAQUE IOHEXOL 300 MG/ML  SOLN COMPARISON:  06/10/2022 FINDINGS: CT CHEST FINDINGS Cardiovascular: Right Port-A-Cath tip high right atrium. Aortic atherosclerosis. Normal heart size, without pericardial effusion. No central pulmonary embolism, on this non-dedicated study. Mediastinum/Nodes: No supraclavicular adenopathy. No mediastinal or hilar adenopathy. Lungs/Pleura: No pleural fluid. Bilateral pulmonary nodules, consistent with metastasis. Index subpleural right lower lobe pulmonary nodule measures 9 x 6 mm on 73/6 versus 10 x 6 mm on the prior. Subpleural  left lower lobe 3 mm nodule on 88/6 measures 4 mm on the prior exam (when remeasured). Right lower lobe index nodule measures 4 mm on 100/6 versus 5 mm on the prior exam (when remeasured). Musculoskeletal: No acute osseous abnormality. CT ABDOMEN PELVIS FINDINGS Hepatobiliary: Hepatic dome 9 mm hypoattenuating lesion is slightly more distinct today but favored to represent a cyst. No suspicious liver lesion. Normal gallbladder, without biliary ductal dilatation. Pancreas: Normal, without mass or ductal dilatation. Spleen: Normal in size, without focal abnormality. Adrenals/Urinary Tract: Normal adrenal glands. Too small to characterize lower pole right renal lesions are statistically most likely cysts . In the absence of clinically indicated signs/symptoms require(s) no independent follow-up. No hydronephrosis. Normal left kidney. Normal urinary  bladder. Stomach/Bowel: Normal stomach, without wall thickening. Normal terminal ileum and appendix. Normal colon. Normal small bowel. Vascular/Lymphatic: Aortic atherosclerosis. No abdominopelvic adenopathy. Reproductive: Hysterectomy.  No adnexal mass. Other: Anterior pelvic peritoneal nodules of maximally 2.1 x 2.0 cm on 106/2 versus 4.4 x 3.4 cm on the prior exam. Vaginal cuff implant measures 2.1 x 1.4 cm on 114/2 versus 5.9 x 2.9 cm on the prior exam. No significant free fluid. Musculoskeletal: Degenerative partial fusion of the bilateral sacroiliac joints. Lumbosacral spondylosis. Lucent lesion within the L3 vertebral body of 11 mm on 120/5 is more distinct than on the prior exam but was present on 07/04/2021 abdominal MRI, favoring a hemangioma. No correlate hypermetabolism on 04/02/2022 PET. IMPRESSION: 1. Moderate response to therapy of peritoneal metastasis within the anterior pelvis. 2. Minimal response to therapy of pulmonary metastasis. 3. No new or progressive disease. 4.  Aortic Atherosclerosis (ICD10-I70.0). Electronically Signed   By: Abigail Miyamoto M.D.   On: 09/01/2022 15:28

## 2022-09-03 NOTE — Assessment & Plan Note (Signed)
I have reviewed imaging study with the patient and her husband She has overall significant positive response to treatment Her side effects are manageable with recent changes in the dose and the timing of her treatment I plan to continue treatment for another 3 months I plan to repeat imaging study again in January

## 2022-09-04 ENCOUNTER — Other Ambulatory Visit: Payer: Self-pay

## 2022-09-05 ENCOUNTER — Ambulatory Visit: Payer: Managed Care, Other (non HMO)

## 2022-09-16 MED FILL — Dexamethasone Sodium Phosphate Inj 100 MG/10ML: INTRAMUSCULAR | Qty: 1 | Status: AC

## 2022-09-17 ENCOUNTER — Inpatient Hospital Stay (HOSPITAL_BASED_OUTPATIENT_CLINIC_OR_DEPARTMENT_OTHER): Payer: Managed Care, Other (non HMO) | Admitting: Physician Assistant

## 2022-09-17 ENCOUNTER — Ambulatory Visit (HOSPITAL_COMMUNITY)
Admission: RE | Admit: 2022-09-17 | Discharge: 2022-09-17 | Disposition: A | Discharge status: Home / Self Care | Payer: Managed Care, Other (non HMO) | Source: Ambulatory Visit | Attending: Physician Assistant | Admitting: Physician Assistant

## 2022-09-17 ENCOUNTER — Inpatient Hospital Stay: Payer: Managed Care, Other (non HMO) | Attending: Gynecologic Oncology

## 2022-09-17 ENCOUNTER — Inpatient Hospital Stay: Payer: Managed Care, Other (non HMO)

## 2022-09-17 ENCOUNTER — Other Ambulatory Visit: Payer: Self-pay | Admitting: Hematology and Oncology

## 2022-09-17 VITALS — BP 125/68 | HR 95 | Temp 98.4°F | Resp 18 | Wt 211.5 lb

## 2022-09-17 DIAGNOSIS — Z90710 Acquired absence of both cervix and uterus: Secondary | ICD-10-CM | POA: Insufficient documentation

## 2022-09-17 DIAGNOSIS — Z800 Family history of malignant neoplasm of digestive organs: Secondary | ICD-10-CM | POA: Insufficient documentation

## 2022-09-17 DIAGNOSIS — Z5111 Encounter for antineoplastic chemotherapy: Secondary | ICD-10-CM | POA: Insufficient documentation

## 2022-09-17 DIAGNOSIS — C549 Malignant neoplasm of corpus uteri, unspecified: Secondary | ICD-10-CM

## 2022-09-17 DIAGNOSIS — C7800 Secondary malignant neoplasm of unspecified lung: Secondary | ICD-10-CM | POA: Insufficient documentation

## 2022-09-17 DIAGNOSIS — D6481 Anemia due to antineoplastic chemotherapy: Secondary | ICD-10-CM | POA: Diagnosis not present

## 2022-09-17 DIAGNOSIS — T451X5A Adverse effect of antineoplastic and immunosuppressive drugs, initial encounter: Secondary | ICD-10-CM

## 2022-09-17 DIAGNOSIS — M7989 Other specified soft tissue disorders: Secondary | ICD-10-CM | POA: Insufficient documentation

## 2022-09-17 DIAGNOSIS — I10 Essential (primary) hypertension: Secondary | ICD-10-CM | POA: Insufficient documentation

## 2022-09-17 DIAGNOSIS — Z5189 Encounter for other specified aftercare: Secondary | ICD-10-CM | POA: Insufficient documentation

## 2022-09-17 DIAGNOSIS — C786 Secondary malignant neoplasm of retroperitoneum and peritoneum: Secondary | ICD-10-CM | POA: Insufficient documentation

## 2022-09-17 LAB — CBC WITH DIFFERENTIAL (CANCER CENTER ONLY)
Abs Immature Granulocytes: 0.06 10*3/uL (ref 0.00–0.07)
Basophils Absolute: 0 10*3/uL (ref 0.0–0.1)
Basophils Relative: 1 %
Eosinophils Absolute: 0.2 10*3/uL (ref 0.0–0.5)
Eosinophils Relative: 2 %
HCT: 27.4 % — ABNORMAL LOW (ref 36.0–46.0)
Hemoglobin: 9.3 g/dL — ABNORMAL LOW (ref 12.0–15.0)
Immature Granulocytes: 1 %
Lymphocytes Relative: 34 %
Lymphs Abs: 2.3 10*3/uL (ref 0.7–4.0)
MCH: 33.2 pg (ref 26.0–34.0)
MCHC: 33.9 g/dL (ref 30.0–36.0)
MCV: 97.9 fL (ref 80.0–100.0)
Monocytes Absolute: 1.2 10*3/uL — ABNORMAL HIGH (ref 0.1–1.0)
Monocytes Relative: 18 %
Neutro Abs: 3 10*3/uL (ref 1.7–7.7)
Neutrophils Relative %: 44 %
Platelet Count: 351 10*3/uL (ref 150–400)
RBC: 2.8 MIL/uL — ABNORMAL LOW (ref 3.87–5.11)
RDW: 22.7 % — ABNORMAL HIGH (ref 11.5–15.5)
WBC Count: 6.7 10*3/uL (ref 4.0–10.5)
nRBC: 0 % (ref 0.0–0.2)

## 2022-09-17 LAB — CMP (CANCER CENTER ONLY)
ALT: 32 U/L (ref 0–44)
AST: 34 U/L (ref 15–41)
Albumin: 3.3 g/dL — ABNORMAL LOW (ref 3.5–5.0)
Alkaline Phosphatase: 72 U/L (ref 38–126)
Anion gap: 6 (ref 5–15)
BUN: 11 mg/dL (ref 8–23)
CO2: 28 mmol/L (ref 22–32)
Calcium: 8.8 mg/dL — ABNORMAL LOW (ref 8.9–10.3)
Chloride: 102 mmol/L (ref 98–111)
Creatinine: 0.78 mg/dL (ref 0.44–1.00)
GFR, Estimated: >60 mL/min (ref >60)
Glucose, Bld: 90 mg/dL (ref 70–99)
Potassium: 3.7 mmol/L (ref 3.5–5.1)
Sodium: 136 mmol/L (ref 135–145)
Total Bilirubin: 0.4 mg/dL (ref 0.3–1.2)
Total Protein: 5.9 g/dL — ABNORMAL LOW (ref 6.5–8.1)

## 2022-09-17 LAB — MAGNESIUM: Magnesium: 1.6 mg/dL — ABNORMAL LOW (ref 1.7–2.4)

## 2022-09-17 MED ORDER — PROCHLORPERAZINE MALEATE 10 MG PO TABS
10.0000 mg | ORAL_TABLET | Freq: Once | ORAL | Status: AC
  Administered 2022-09-17: 10 mg via ORAL
  Filled 2022-09-17: qty 1

## 2022-09-17 MED ORDER — SODIUM CHLORIDE 0.9 % IV SOLN: Freq: Once | INTRAVENOUS | Status: AC

## 2022-09-17 MED ORDER — POTASSIUM CHLORIDE CRYS ER 20 MEQ PO TBCR: 20.0000 meq | EXTENDED_RELEASE_TABLET | Freq: Every day | ORAL | 0 refills | Status: DC

## 2022-09-17 MED ORDER — SODIUM CHLORIDE 0.9 % IV SOLN
10.0000 mg | Freq: Once | INTRAVENOUS | Status: AC
  Administered 2022-09-17: 10 mg via INTRAVENOUS
  Filled 2022-09-17: qty 10

## 2022-09-17 MED ORDER — SODIUM CHLORIDE 0.9 % IV SOLN
75.0000 mg/m2 | Freq: Once | INTRAVENOUS | Status: AC
  Administered 2022-09-17: 160 mg via INTRAVENOUS
  Filled 2022-09-17: qty 16

## 2022-09-17 MED ORDER — SODIUM CHLORIDE 0.9% FLUSH
10.0000 mL | Freq: Once | INTRAVENOUS | Status: AC
  Administered 2022-09-17: 10 mL

## 2022-09-17 MED ORDER — FUROSEMIDE 20 MG PO TABS: 20.0000 mg | ORAL_TABLET | Freq: Every day | ORAL | 0 refills | Status: DC

## 2022-09-17 MED ORDER — SODIUM CHLORIDE 0.9 % IV SOLN
600.0000 mg/m2 | Freq: Once | INTRAVENOUS | Status: AC
  Administered 2022-09-17: 1292 mg via INTRAVENOUS
  Filled 2022-09-17: qty 33.98

## 2022-09-17 MED ORDER — SODIUM CHLORIDE 0.9% FLUSH
10.0000 mL | INTRAVENOUS | Status: DC | PRN
  Administered 2022-09-17: 10 mL

## 2022-09-17 MED ORDER — HEPARIN SOD (PORK) LOCK FLUSH 100 UNIT/ML IV SOLN
500.0000 [IU] | Freq: Once | INTRAVENOUS | Status: AC | PRN
  Administered 2022-09-17: 500 [IU]

## 2022-09-17 NOTE — Patient Instructions (Signed)
[facility] ONCOLOG[facility]  Discharge Instructions: Thank you for choosing [facility] to provide your oncology and hematology care.   If you have a lab appointment with the [facility], please go directly to the [facility] and check in at the registration area.   Wear comfortable clothing and clothing appropriate for easy access to any Portacath or PICC line.   We strive to give you quality time with your provider. You may need to reschedule your appointment if you arrive late (15 or more minutes).  Arriving late affects you and other patients whose appointments are after yours.  Also, if you miss three or more appointments without notifying the office, you may be dismissed from the clinic at the provider's discretion.      For prescription refill requests, have your pharmacy contact our office and allow 72 hours for refills to be completed.    Today you received the following chemotherapy and/or immunotherapy agents: Gemzar/Docetaxel      To help prevent nausea and vomiting after your treatment, we encourage you to take your nausea medication as directed.  BELOW ARE SYMPTOMS THAT SHOULD BE REPORTED IMMEDIATELY: *FEVER GREATER THAN 100.4 F (38 C) OR HIGHER *CHILLS OR SWEATING *NAUSEA AND VOMITING THAT IS NOT CONTROLLED WITH YOUR NAUSEA MEDICATION *UNUSUAL SHORTNESS OF BREATH *UNUSUAL BRUISING OR BLEEDING *URINARY PROBLEMS (pain or burning when urinating, or frequent urination) *BOWEL PROBLEMS (unusual diarrhea, constipation, pain near the anus) TENDERNESS IN MOUTH AND THROAT WITH OR WITHOUT PRESENCE OF ULCERS (sore throat, sores in mouth, or a toothache) UNUSUAL RASH, SWELLING OR PAIN  UNUSUAL VAGINAL DISCHARGE OR ITCHING   Items with * indicate a potential emergency and should be followed up as soon as possible or go to the Emergency Department if any problems should occur.  Please show the CHEMOTHERAPY ALERT CARD or IMMUNOTHERAPY ALERT CARD at  check-in to the Emergency Department and triage nurse.  Should you have questions after your visit or need to cancel or reschedule your appointment, please contact [facility]  Dept: [phone]  and follow the prompts.  Office hours are 8:00 a.m. to 4:30 p.m. Monday - Friday. Please note that voicemails left after 4:00 p.m. may not be returned until the following business day.  We are closed weekends and major holidays. You have access to a nurse at all times for urgent questions. Please call the main number to the clinic Dept: [phone] and follow the prompts.   For any non-urgent questions, you may also contact your provider using MyChart. We now offer e-Visits for anyone 41 and older to request care online for non-urgent symptoms. For details visit [email or web address].   Also download the MyChart app! Go to the app store, search "MyChart", open the app, select [facility], and log in with your MyChart username and password.  Masks are optional in the cancer centers. If you would like for your care team to wear a mask while they are taking care of you, please let them know. You may have one support person who is at least 63 years old accompany you for your appointments.

## 2022-09-17 NOTE — Progress Notes (Signed)
Right lower extremity venous duplex has been completed. Preliminary results can be found in CV Proc through chart review.  Results were given to Leah Diaz at [place] office.  09/17/22 12:47 PM Leah Diaz RVT

## 2022-09-17 NOTE — Progress Notes (Signed)
Pt presented to inf today with new c/o lower leg & ankle swelling with some redness in her right lower leg and a bruised-looking area approximately the size of an orange on her right proximal anterior thigh. Pt informed this RN that she noticed the changes about 3-4 days ago. This RN assessed the Pt and noted no c/o pain or SOB, bilateral lower extremities have +1 pitting edema. This RN noted redness present on the right lower extremity, the area was blanchable and not warm to touch. This RN made Dr. [name] aware of changes and assessment findings. Dr. [name] recommended Sonda Rumble in [facility] to evaluate Pt during infusion today. Dr. [name] gave the Pt the option to proceed with tx or to hold tx today. This RN educated the Pt on her options. The Pt verbalized understanding and stated "I want to go forward with treatment today". This RN made Dr. [name] aware of Pt's choice to proceed.  Sonda Rumble and Apolonio Schneiders RN in [facility] made aware. This RN educated the Pt and made her aware that she would be seen by Sonda Rumble today. Pt was agreeable and verbalized understanding.

## 2022-09-17 NOTE — Progress Notes (Signed)
Symptom Management Consult note [name]    Patient Care Team: [name], [name], MD as PCP - General (Family Medicine) [name], MD as Consulting Physician (Gynecologic Oncology) [name], RN as Registered Nurse    Name of the patient: Leah Diaz  [id number]  16-Aug-1961   Date of visit: 09/17/2022   Chief Complaint/Reason for visit: leg swelling   Current Therapy: taxotere and gemcitabine  Last treatment:  Day 1   Cycle 4 on 09/03/22   ASSESSMENT & PLAN: Patient is a 61 y.o. 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 [name].  I have viewed most recent oncology note and lab work.    #) Uterine sarcoma -Here for tx today day 8 cycle 4. - Next appointment with oncologist is 10/01/22.  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 No indication for transfusion.Will continue to be monitored at future appointments   #) Leg swelling -Exam with 1+ bilateral pitting edema with R leg > L. DVT obtained and is negative. -CMP without significant electrolyte derangement.  -Discussed treatment options with patient. She is agreeable with plan for short course of lasix to help reduce swelling. She takes Hyzaar daily so instructed patient to check her blood pressure before taking lasix and hold if SBP<100. As patient will likely have decrease in potassium while on Hyzaar and lasix will prescribe potassium 20 mg once daily to take with the lasix. Patient knows not to take it if holding lasix. -She only takes Decadron around treatment days so doubt steroid is because of peripheral edema. -Encouraged patient to follow up with pcp for further evaluation of lower extremity edema. This could be side effect of chemo however she would need work up to r/o cardiac etiology.  -Chart review shows echo from 04/09/22 with EF 65-70%  Strict ED precautions discussed should symptoms worsen.     Heme/Onc History: Oncology  History Overview Note  High grade and LMS, 50% ER/PR positive dMMR normal, PD-L1 CPS 3%   Uterine sarcoma ([place])  06/02/2021 Imaging   MRI pelvis Heterogeneous 9.0 cm intrauterine mass within the intramural/submucosal anterior fundus demonstrating interval growth, internal enhancement, and restricted diffusion 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 No extra uterine extension. No evidence of metastatic disease within the pelvis.   Mild thickening of the endometrial stripe, likely related to entrapment by the adjacent uterine mass   06/03/2021 Initial Diagnosis   Uterine sarcoma ([place])   06/03/2021 Cancer Staging   Staging form: Corpus Uteri - Sarcoma, AJCC 7th Edition - Clinical stage from 06/03/2021: FIGO Stage IVB (rT1b, N0, M1) - Signed by [name], MD on 03/31/2022 Diagnostic confirmation: Positive histology Stage prefix: Recurrence Biopsy of metastatic site performed: No Lymph-vascular invasion (LVI): LVI present/identified, NOS   06/03/2021 Pathology Results   FINAL MICROSCOPIC DIAGNOSIS:   A. UTERUS, CERVIX, BILATERAL FALLOPIAN TUBES AND OVARIES, HYSTERECTOMY:  - Uterus:       Mixed high grade uterine sarcoma, spanning 6 cm, see comment.       Extensive lymphovascular invasion.       See oncology table.  - Cervix: Benign squamous and endocervical mucosa. No dysplasia or  malignancy.  - Bilateral ovaries: Unremarkable. No malignancy.  - Bilateral fallopian tubes: Unremarkable. No malignancy.   ONCOLOGY TABLE:   UTERUS, SARCOMA: Resection   Procedure: Total hysterectomy and bilateral salpingo-oophorectomy  Specimen Integrity: Focally disrupted on posterior surface  Tumor Site: Anterior wall  Tumor Size: 6 cm  Histologic Type: High grade sarcoma, mixed. See comment.  Other Tissue/ Organ Involvement: Not identified  Lymphovascular Invasion: Present, extensive.  Margins: All margins negative for tumor  Regional Lymph Nodes: Not  applicable (no lymph nodes submitted or found)  Distant Metastasis:       Distant Site(s) Involved: Not applicable  Pathologic Stage Classification (pTNM, AJCC 8th Edition): pT1b, pN not  assigned  Ancillary Studies: Can be performed upon request  Representative Tumor Block: A6  Comment(s): The tumor has two morphologically different components. One component consists of malignant spindle cells which can be seen arising from the smooth muscle. These foci are positive for SMA, desmin, cyclinD1 (focal), and CD10 (patchy). The other component consists of round to slightly spindle cells with abundant admixed vessels and occasional large pleomorphic cells. These areas are positive for SMA (patchy weak), desmin (focal), CD10 (variable with diffuse areas). Pancytokeratin is negative. 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   ADDENDUM:   PROGNOSTIC INDICATOR RESULTS:   Immunohistochemical and morphometric analysis performed manually    Estrogen Receptor:       POSITIVE, 50%, WEAK TO MODERATE STAINING  Progesterone Receptor:   POSITIVE, 50%, MODERATE TO STRONG STAINING   Reference Range Estrogen and Progesterone Receptor       Negative  0%       Positive  >1%    06/03/2021 Surgery   Surgeon: Donaciano Eva    Assistants: Dr Lahoma Crocker (an MD assistant was necessary for tissue manipulation, management of robotic instrumentation, retraction and positioning due to the complexity of the case and hospital policies).  Operation: Robotic-assisted laparoscopic total hysterectomy >250gm with bilateral salpingoophorectomy, minilaparotomy for specimen delivery   Surgeon: Donaciano Eva    Operative Findings:  : Bulky 16cm uterus with intra-uterine mass (consistent with a fibroid-like mass) , smooth normal appearing serosa, no suspicious bulky nodes. Normal ovaries bilaterally. Normal upper abdomen. Uterus too  large to deliver vaginally in tact.    06/18/2021 Imaging   1. Multiple bilateral pulmonary nodules, measuring up to 9 mm. Most of these are perifissural and subpleural in location, likely lymph nodes. Nevertheless, close follow-up recommended to exclude metastatic disease. 2. 11 mm hypoattenuating lesion towards the dome of the liver, indeterminate. MRI abdomen with and without contrast could be used to further evaluate as clinically warranted. 3. Aortic Atherosclerosis (ICD10-I70.0).   10/13/2021 Imaging   1. No acute findings within the abdomen or pelvis. No specific findings identified to suggest residual or recurrence of tumor. 2. Stable small pulmonary nodules within the left lower lobe.   03/18/2022 Imaging   1. Interval development of 2 soft tissue nodules in the anterior pelvis measuring 10 mm and 7 mm respectively. Close follow-up recommended to exclude metastatic disease. PET-CT may be warranted to further evaluate. 2. Development of 2 mm left upper lobe parenchymal nodule, nonspecific. Close attention on follow-up imaging recommended  3. Stable appearance of index bilateral pulmonary nodules identified previously. 4. Aortic Atherosclerosis (ICD10-I70.0).   04/02/2022 PET scan   1. Signs of local tumor recurrence noted at the vaginal cuff. 2. 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 New since 10/13/2021 and progressive when compared with 03/17/2022. 3. New focal area of increased uptake within the right biceps femoris muscle which is suspicious for skeletal muscle involvement. 4. Stable appearance of small pulmonary nodules described on 03/17/2022. These are too small to characterize by PET-CT.  04/09/2022 Procedure   Successful placement of a right IJ approach Power Port with ultrasound and fluoroscopic guidance. The catheter is ready for use.   04/09/2022 Echocardiogram    1. Left ventricular ejection fraction, by estimation, is 65 to 70%.  The left ventricle has normal function. The left ventricle has no regional wall motion abnormalities. There is mild concentric left ventricular hypertrophy. Left ventricular diastolic parameters are consistent with Grade I diastolic dysfunction (impaired relaxation).  2. Right ventricular systolic function is normal. The right ventricular size is normal.  3. The mitral valve is normal in structure. No evidence of mitral valve regurgitation.  4. The aortic valve is tricuspid. Aortic valve regurgitation is not visualized. No aortic stenosis is present.  5. The inferior vena cava is normal in size with greater than 50% respiratory variability, suggesting right atrial pressure of 3 mmHg.   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 Status post hysterectomy and bilateral oophorectomy. Multiple newand enlarged peritoneal nodules throughout the low abdomen and pelvis, previously FDG avid and consistent with worsened locally recurrent and peritoneal metastatic disease. 2. Multiple small bilateral pulmonary nodules, several of which are slightly enlarged compared to prior examination, consistent with worsened pulmonary metastatic disease.   Aortic Atherosclerosis (ICD10-I70.0).     06/25/2022 - 07/06/2022 Chemotherapy   Patient is on Treatment Plan : UTERINE UNDIFFERENTIATED / LEIOMYOSARCOMA Gemcitabine D1,8 + Docetaxel D8 (900/100) q21d     06/25/2022 -  Chemotherapy   Patient is on Treatment Plan : UTERINE UNDIFFERENTIATED LEIOMYOSARCOMA Gemcitabine D1,8 + Docetaxel D8 (900/100) q21d     09/02/2022 Imaging   1. Moderate response to therapy of peritoneal metastasis within the anterior pelvis. 2. Minimal response to therapy of pulmonary metastasis. 3. No new or progressive disease. 4.  Aortic Atherosclerosis (ICD10-I70.0).         Interval history-: Arneda Sappington is a 61 y.o. female with  oncologic history as above seen in the infusion center today with chief complaint of bilateral leg swelling x4 days.  Patient states the swelling is worse in the evenings. She denies any associated pain. She denies high salt diet. She has history of hypertension and takes Hyzaar 100-25 mg daily, otherwise denies cardiac history.  She recently traveled to [place] although stopped multiple times for bathrooms breaks and to get gas. She tells me that she works from home and sits at a high top table with her legs dangling. She does get up frequently during the work day, She denies any fever, chills, chest pain, shortness of breath, numbness, tingling, weakness.       ROS  All other systems are reviewed and are negative for acute change except as noted in the HPI.    No Known Allergies   Past Medical History:  Diagnosis Date   Anemia    Arthritis    Asthma    remote history as a teenager   GERD (gastroesophageal reflux disease)    Hypertension    Menopausal symptoms 05/05/2011   Migraines 05/05/2011   Seasonal allergies    Uterine sarcoma ([place])      Past Surgical History:  Procedure Laterality Date   ABDOMINAL HYSTERECTOMY     BREAST BIOPSY     x 2 benign   COLONOSCOPY N/A 09/18/2021   Procedure: COLONOSCOPY;  Surgeon: Rogene Houston, MD;  Location: [facility];  Service: Endoscopy;  Laterality:  N/A;  8:05   HYSTEROSCOPY  11/06/2020   IR IMAGING GUIDED PORT INSERTION  04/08/2022   ROBOTIC ASSISTED TOTAL HYSTERECTOMY WITH BILATERAL SALPINGO OOPHERECTOMY N/A 06/03/2021   Procedure: XI ROBOTIC ASSISTED TOTAL HYSTERECTOMY FOR UTERUS GREATER THAN 250G WITH BILATERAL SALPINGO OOPHORECTOMY;  Surgeon: Everitt Amber, MD;  Location: [facility];  Service: Gynecology;  Laterality: N/A;   TUBAL LIGATION  05/05/2011    Social History   Socioeconomic History   Marital status: Married    Spouse name: Not on file   Number of children: Not on file   Years of education: Not on file   Highest  education level: Not on file  Occupational History   Not on file  Tobacco Use   Smoking status: Never   Smokeless tobacco: Never  Vaping Use   Vaping Use: Never used  Substance and Sexual Activity   Alcohol use: No    Comment: Occasional   Drug use: No   Sexual activity: Yes  Other Topics Concern   Not on file  Social History Narrative   Not on file   Social Determinants of Health   Financial Resource Strain: Not on file  Food Insecurity: Not on file  Transportation Needs: Not on file  Physical Activity: Not on file  Stress: Not on file  Social Connections: Not on file  Intimate Partner Violence: Not on file    Family History  Problem Relation Age of Onset   Colon cancer Maternal Uncle    Pancreatic cancer Maternal Grandmother      Current Outpatient Medications:    furosemide (LASIX) 20 MG tablet, Take 1 tablet (20 mg total) by mouth daily for 7 days. Do not take if systolic # is <161. (Top number in blood pressure reading), Disp: 7 tablet, Rfl: 0   LORazepam (ATIVAN) 0.5 MG tablet, Take 1 tablet (0.5 mg total) by mouth 2 (two) times daily as needed for anxiety., Disp: 3 tablet, Rfl: 0   potassium chloride SA (KLOR-CON M) 20 MEQ tablet, Take 1 tablet (20 mEq total) by mouth daily for 7 days., Disp: 7 tablet, Rfl: 0   acetaminophen (TYLENOL) 500 MG tablet, Take 500 mg by mouth every 6 (six) hours as needed for mild pain, moderate pain or fever., Disp: , Rfl:    aspirin 81 MG EC tablet, Take by mouth daily., Disp: , Rfl:    cetirizine (ZYRTEC) 10 MG tablet, Take 10 mg by mouth daily., Disp: , Rfl:    Cholecalciferol (VITAMIN D3 PO), Take 1 tablet by mouth daily., Disp: , Rfl:    dexamethasone (DECADRON) 4 MG tablet, Take 2 pills the day before chemo and daily for 2 days after chemo, Disp: 30 tablet, Rfl: 0   lidocaine-prilocaine (EMLA) cream, Apply 1 Application topically as needed., Disp: 30 g, Rfl: 3   losartan-hydrochlorothiazide (HYZAAR) 100-25 MG tablet, Take 1  tablet by mouth daily. HOLD 8/31 per Dr. [name], Disp: , Rfl:    omeprazole (PRILOSEC) 20 MG capsule, Take 1 capsule (20 mg total) by mouth 2 (two) times daily., Disp: 28 capsule, Rfl: 0   ondansetron (ZOFRAN) 8 MG tablet, Take 1 tablet (8 mg total) by mouth every 8 (eight) hours as needed for nausea., Disp: 30 tablet, Rfl: 3 No current facility-administered medications for this visit.  Facility-Administered Medications Ordered in Other Visits:    sodium chloride flush (NS) 0.9 % injection 10 mL, 10 mL, Intracatheter, PRN, [name], Ni, MD, 10 mL at 09/17/22 1232  PHYSICAL EXAM: ECOG FS:1 -  Symptomatic but completely ambulatory  T: 98.4   BP: 125/68   HR: 95   Resp: 18   O2: 99% RA Physical Exam Vitals and nursing note reviewed.  Constitutional:      Appearance: She is well-developed. She is not ill-appearing or toxic-appearing.  HENT:     Head: Normocephalic.     Nose: Nose normal.  Eyes:     Conjunctiva/sclera: Conjunctivae normal.  Neck:     Vascular: No JVD.  Cardiovascular:     Rate and Rhythm: Normal rate and regular rhythm.     Pulses: Normal pulses.          Dorsalis pedis pulses are 2+ on the right side and 2+ on the left side.     Heart sounds: Normal heart sounds.  Pulmonary:     Effort: Pulmonary effort is normal.     Breath sounds: Normal breath sounds.  Abdominal:     General: There is no distension.  Musculoskeletal:     Cervical back: Normal range of motion.     Comments: 1+ pitting lower extremity edema R > L.  Homans sign absent bilaterally, no palpable cords, compartments are soft    Skin:    General: Skin is warm and dry.     Comments: Equal tactile temperature in bilateral lower extremities  Neurological:     Mental Status: She is oriented to person, place, and time.        LABORATORY DATA: I have reviewed the data as listed    Latest Ref Rng & Units 09/17/2022    7:36 AM 09/01/2022    7:44 AM 08/21/2022    7:52 AM  CBC  WBC 4.0 - 10.5 K/uL  6.7  40.6  6.3   Hemoglobin 12.0 - 15.0 g/dL 9.3  10.5  9.5   Hematocrit 36.0 - 46.0 % 27.4  30.4  28.3   Platelets 150 - 400 K/uL 351  159  221         Latest Ref Rng & Units 09/17/2022    7:36 AM 09/01/2022    7:44 AM 08/21/2022    7:52 AM  CMP  Glucose 70 - 99 mg/dL 90  124  108   BUN 8 - 23 mg/dL _0 Creatinine 0.44 - 1.00 mg/dL 0.78  0.85  0.64   Sodium 135 - 145 mmol/L 136  134  137   Potassium 3.5 - 5.1 mmol/L 3.7  3.4  3.5   Chloride 98 - 111 mmol/L 102  97  101   CO2 22 - 32 mmol/L _1 Calcium 8.9 - 10.3 mg/dL 8.8  8.9  9.1   Total Protein 6.5 - 8.1 g/dL 5.9  6.2  6.3   Total Bilirubin 0.3 - 1.2 mg/dL 0.4  0.5  0.5   Alkaline Phos 38 - 126 U/L 72  166  71   AST 15 - 41 U/L 34  25  42   ALT 0 - 44 U/L 32  25  41        RADIOGRAPHIC STUDIES (from last 24 hours if applicable) I have personally reviewed the radiological images as listed and agreed with the findings in the report. VAS [place] LOWER EXTREMITY VENOUS (DVT)  Result Date: 09/17/2022  Lower Venous DVT Study Patient Name:  ZURIYAH SHATZ  Date of Exam:   09/17/2022 Medical Rec #: [id number]        Accession #:  [id number] Date of Birth: 1961-03-07        Patient Gender: F Patient Age:   56 years Exam Location:  [facility] Procedure:      VAS [place] LOWER EXTREMITY VENOUS (DVT) Referring Phys: Sherol Dade --------------------------------------------------------------------------------  Indications: Swelling.  Risk Factors: Cancer. Comparison Study: No prior studies. Performing Technologist: Oliver Hum RVT  Examination Guidelines: A complete evaluation includes B-mode imaging, spectral Doppler, color Doppler, and power Doppler as needed of all accessible portions of each vessel. Bilateral testing is considered an integral part of a complete examination. Limited examinations for reoccurring indications may be performed as noted. The reflux portion of the exam is performed with the patient in  reverse Trendelenburg.  +---------+---------------+---------+-----------+----------+--------------+ RIGHT    CompressibilityPhasicitySpontaneityPropertiesThrombus Aging +---------+---------------+---------+-----------+----------+--------------+ CFV      Full           Yes      Yes                                 +---------+---------------+---------+-----------+----------+--------------+ SFJ      Full                                                        +---------+---------------+---------+-----------+----------+--------------+ FV Prox  Full                                                        +---------+---------------+---------+-----------+----------+--------------+ FV Mid   Full                                                        +---------+---------------+---------+-----------+----------+--------------+ FV DistalFull                                                        +---------+---------------+---------+-----------+----------+--------------+ PFV      Full                                                        +---------+---------------+---------+-----------+----------+--------------+ POP      Full           Yes      Yes                                 +---------+---------------+---------+-----------+----------+--------------+ PTV      Full                                                        +---------+---------------+---------+-----------+----------+--------------+  PERO     Full                                                        +---------+---------------+---------+-----------+----------+--------------+   +----+---------------+---------+-----------+----------+--------------+ LEFTCompressibilityPhasicitySpontaneityPropertiesThrombus Aging +----+---------------+---------+-----------+----------+--------------+ CFV Full           Yes      Yes                                  +----+---------------+---------+-----------+----------+--------------+     Summary: RIGHT: - There is no evidence of deep vein thrombosis in the lower extremity.  - No cystic structure found in the popliteal fossa.  LEFT: - No evidence of common femoral vein obstruction.  *See table(s) above for measurements and observations. Electronically signed by Jamelle Haring on 09/17/2022 at 4:03:44 PM.    Final         Visit Diagnosis: 1. Leg swelling   2. Uterine sarcoma ([place])   3. Anemia due to antineoplastic chemotherapy      No orders of the defined types were placed in this encounter.   All questions were answered. The patient knows to call the clinic with any problems, questions or concerns. No barriers to learning was detected.  I have spent a total of 30 minutes minutes of face-to-face and non-face-to-face time, preparing to see the patient, obtaining and/or reviewing separately obtained history, performing a medically appropriate examination, counseling and educating the patient, ordering tests, documenting clinical information in the electronic health record, and care coordination (communications with other health care professionals or caregivers).    Thank you for allowing me to participate in the care of this patient.    Barrie Folk, PA-C Department of Hematology/Oncology [facility] at [facility] Phone: [phone]  [phone]    09/17/2022 4:07 PM

## 2022-09-19 ENCOUNTER — Inpatient Hospital Stay: Payer: Managed Care, Other (non HMO)

## 2022-09-19 VITALS — BP 114/70 | HR 107 | Temp 98.3°F | Resp 19

## 2022-09-19 DIAGNOSIS — C786 Secondary malignant neoplasm of retroperitoneum and peritoneum: Secondary | ICD-10-CM | POA: Diagnosis not present

## 2022-09-19 DIAGNOSIS — Z90710 Acquired absence of both cervix and uterus: Secondary | ICD-10-CM | POA: Diagnosis not present

## 2022-09-19 DIAGNOSIS — Z800 Family history of malignant neoplasm of digestive organs: Secondary | ICD-10-CM | POA: Insufficient documentation

## 2022-09-19 DIAGNOSIS — Z5189 Encounter for other specified aftercare: Secondary | ICD-10-CM | POA: Insufficient documentation

## 2022-09-19 DIAGNOSIS — Z5111 Encounter for antineoplastic chemotherapy: Secondary | ICD-10-CM | POA: Insufficient documentation

## 2022-09-19 DIAGNOSIS — C7800 Secondary malignant neoplasm of unspecified lung: Secondary | ICD-10-CM | POA: Insufficient documentation

## 2022-09-19 DIAGNOSIS — M7989 Other specified soft tissue disorders: Secondary | ICD-10-CM | POA: Diagnosis not present

## 2022-09-19 DIAGNOSIS — D6481 Anemia due to antineoplastic chemotherapy: Secondary | ICD-10-CM | POA: Insufficient documentation

## 2022-09-19 DIAGNOSIS — C549 Malignant neoplasm of corpus uteri, unspecified: Secondary | ICD-10-CM | POA: Insufficient documentation

## 2022-09-19 DIAGNOSIS — I10 Essential (primary) hypertension: Secondary | ICD-10-CM | POA: Diagnosis not present

## 2022-09-19 MED ORDER — PEGFILGRASTIM INJECTION 6 MG/0.6ML ~~LOC~~
6.0000 mg | PREFILLED_SYRINGE | Freq: Once | SUBCUTANEOUS | Status: AC
  Administered 2022-09-19: 6 mg via SUBCUTANEOUS

## 2022-09-24 ENCOUNTER — Other Ambulatory Visit: Payer: Self-pay

## 2022-09-30 ENCOUNTER — Telehealth: Payer: Self-pay | Admitting: *Deleted

## 2022-09-30 NOTE — Telephone Encounter (Signed)
Patient called. She has had cold symptoms since last Friday. Started w/scratchy throat and runny nose. Feels some better now, throat not as scratchy. She said she tested and is negative for Covid. She has lab appt/appt with Dr. [name] and  treatment in the morning and wants to know if she should come in. She wants to come if its ok. Dr. [name] given message and responded: If she doesn't feel too bad and wants to come, that's ok. Can decide on treatment once she's seen. If she decides in the morning that she doesn't feel well enough, she can call then. Contacted patient with MD response as above. She said she would come in if she feels the same. Advised her to wear a mask and she verbalized understanding.

## 2022-10-01 ENCOUNTER — Inpatient Hospital Stay: Payer: Managed Care, Other (non HMO)

## 2022-10-01 ENCOUNTER — Inpatient Hospital Stay: Payer: Managed Care, Other (non HMO) | Admitting: Hematology and Oncology

## 2022-10-01 ENCOUNTER — Encounter: Payer: Self-pay | Admitting: Hematology and Oncology

## 2022-10-01 VITALS — BP 108/66 | HR 95 | Resp 16

## 2022-10-01 VITALS — BP 127/70 | HR 120 | Temp 98.3°F | Resp 18 | Ht 67.5 in | Wt 209.2 lb

## 2022-10-01 DIAGNOSIS — C549 Malignant neoplasm of corpus uteri, unspecified: Secondary | ICD-10-CM

## 2022-10-01 DIAGNOSIS — D61818 Other pancytopenia: Secondary | ICD-10-CM

## 2022-10-01 DIAGNOSIS — Z5111 Encounter for antineoplastic chemotherapy: Secondary | ICD-10-CM | POA: Diagnosis not present

## 2022-10-01 DIAGNOSIS — R600 Localized edema: Secondary | ICD-10-CM | POA: Diagnosis not present

## 2022-10-01 DIAGNOSIS — R0981 Nasal congestion: Secondary | ICD-10-CM | POA: Diagnosis not present

## 2022-10-01 LAB — CBC WITH DIFFERENTIAL (CANCER CENTER ONLY)
Abs Immature Granulocytes: 2.5 10*3/uL — ABNORMAL HIGH (ref 0.00–0.07)
Basophils Absolute: 0.2 10*3/uL — ABNORMAL HIGH (ref 0.0–0.1)
Basophils Relative: 1 %
Eosinophils Absolute: 0.1 10*3/uL (ref 0.0–0.5)
Eosinophils Relative: 0 %
HCT: 30.6 % — ABNORMAL LOW (ref 36.0–46.0)
Hemoglobin: 10.4 g/dL — ABNORMAL LOW (ref 12.0–15.0)
Immature Granulocytes: 8 %
Lymphocytes Relative: 10 %
Lymphs Abs: 3 10*3/uL (ref 0.7–4.0)
MCH: 33.7 pg (ref 26.0–34.0)
MCHC: 34 g/dL (ref 30.0–36.0)
MCV: 99 fL (ref 80.0–100.0)
Monocytes Absolute: 2.2 10*3/uL — ABNORMAL HIGH (ref 0.1–1.0)
Monocytes Relative: 7 %
Neutro Abs: 22 10*3/uL — ABNORMAL HIGH (ref 1.7–7.7)
Neutrophils Relative %: 74 %
Platelet Count: 145 10*3/uL — ABNORMAL LOW (ref 150–400)
RBC: 3.09 MIL/uL — ABNORMAL LOW (ref 3.87–5.11)
RDW: 21.7 % — ABNORMAL HIGH (ref 11.5–15.5)
WBC Count: 29.9 10*3/uL — ABNORMAL HIGH (ref 4.0–10.5)
nRBC: 0.2 % (ref 0.0–0.2)

## 2022-10-01 LAB — CMP (CANCER CENTER ONLY)
ALT: 16 U/L (ref 0–44)
AST: 23 U/L (ref 15–41)
Albumin: 3.3 g/dL — ABNORMAL LOW (ref 3.5–5.0)
Alkaline Phosphatase: 138 U/L — ABNORMAL HIGH (ref 38–126)
Anion gap: 7 (ref 5–15)
BUN: 14 mg/dL (ref 8–23)
CO2: 28 mmol/L (ref 22–32)
Calcium: 8.7 mg/dL — ABNORMAL LOW (ref 8.9–10.3)
Chloride: 99 mmol/L (ref 98–111)
Creatinine: 1.04 mg/dL — ABNORMAL HIGH (ref 0.44–1.00)
GFR, Estimated: >60 mL/min (ref >60)
Glucose, Bld: 108 mg/dL — ABNORMAL HIGH (ref 70–99)
Potassium: 3.7 mmol/L (ref 3.5–5.1)
Sodium: 134 mmol/L — ABNORMAL LOW (ref 135–145)
Total Bilirubin: 0.4 mg/dL (ref 0.3–1.2)
Total Protein: 5.9 g/dL — ABNORMAL LOW (ref 6.5–8.1)

## 2022-10-01 MED ORDER — SODIUM CHLORIDE 0.9 % IV SOLN
600.0000 mg/m2 | Freq: Once | INTRAVENOUS | Status: AC
  Administered 2022-10-01: 1292 mg via INTRAVENOUS
  Filled 2022-10-01: qty 33.98

## 2022-10-01 MED ORDER — SODIUM CHLORIDE 0.9% FLUSH
10.0000 mL | Freq: Once | INTRAVENOUS | Status: AC
  Administered 2022-10-01: 10 mL

## 2022-10-01 MED ORDER — LOSARTAN POTASSIUM-HCTZ 100-25 MG PO TABS: 1.0000 | ORAL_TABLET | Freq: Every day | ORAL | Status: DC

## 2022-10-01 MED ORDER — PROCHLORPERAZINE MALEATE 10 MG PO TABS
10.0000 mg | ORAL_TABLET | Freq: Once | ORAL | Status: AC
  Administered 2022-10-01: 10 mg via ORAL
  Filled 2022-10-01: qty 1

## 2022-10-01 MED ORDER — HEPARIN SOD (PORK) LOCK FLUSH 100 UNIT/ML IV SOLN
500.0000 [IU] | Freq: Once | INTRAVENOUS | Status: AC
  Administered 2022-10-01: 500 [IU]

## 2022-10-01 NOTE — Assessment & Plan Note (Signed)
She has mild nasal congestion, could be due to allergies We will proceed with treatment without delay

## 2022-10-01 NOTE — Assessment & Plan Note (Signed)
Her last imaging study in October show positive response to treatment Her side effects are manageable with recent changes in the dose and the timing of her treatment I plan to continue treatment for another 3 months I plan to repeat imaging study again in January

## 2022-10-01 NOTE — Assessment & Plan Note (Signed)
She was extensively evaluated with no signs of DVT I suspect this is due to fluid retention from treatment I do not recommend long-term use of furosemide We discussed conservative approach with leg elevation and reduce salt intake

## 2022-10-01 NOTE — Patient Instructions (Signed)
[facility] MEDICAL [facility]COLOG[facility]  Discharge Instructions: Thank you for choosing [facility] to provide your oncology and hematology care.   If you have a lab appointment with the Cancer Center, please go directly to the Cancer Center and check in at the registration area.   Wear comfortable clothing and clothing appropriate for easy access to any Portacath or PICC line.   We strive to give you quality time with your provider. You may need to reschedule your appointment if you arrive late (15 or more minutes).  Arriving late affects you and other patients whose appointments are after yours.  Also, if you miss three or more appointments without notifying the office, you may be dismissed from the clinic at the provider's discretion.      For prescription refill requests, have your pharmacy contact our office and allow 72 hours for refills to be completed.    Today you received the following chemotherapy and/or immunotherapy agents: Gemcitabine.       To help prevent nausea and vomiting after your treatment, we encourage you to take your nausea medication as directed.  BELOW ARE SYMPTOMS THAT SHOULD BE REPORTED IMMEDIATELY: *FEVER GREATER THAN 100.4 F (38 C) OR HIGHER *CHILLS OR SWEATING *NAUSEA AND VOMITING THAT IS NOT CONTROLLED WITH YOUR NAUSEA MEDICATION *UNUSUAL SHORTNESS OF BREATH *UNUSUAL BRUISING OR BLEEDING *URINARY PROBLEMS (pain or burning when urinating, or frequent urination) *BOWEL PROBLEMS (unusual diarrhea, constipation, pain near the anus) TENDERNESS IN MOUTH AND THROAT WITH OR WITHOUT PRESENCE OF ULCERS (sore throat, sores in mouth, or a toothache) UNUSUAL RASH, SWELLING OR PAIN  UNUSUAL VAGINAL DISCHARGE OR ITCHING   Items with * indicate a potential emergency and should be followed up as soon as possible or go to the Emergency Department if any problems should occur.  Please show the CHEMOTHERAPY ALERT CARD or IMMUNOTHERAPY ALERT CARD at check-in  to the Emergency Department and triage nurse.  Should you have questions after your visit or need to cancel or reschedule your appointment, please contact [facility]  Dept: [phone]  and follow the prompts.  Office hours are 8:00 a.m. to 4:30 p.m. Monday - Friday. Please note that voicemails left after 4:00 p.m. may not be returned until the following business day.  We are closed weekends and major holidays. You have access to a nurse at all times for urgent questions. Please call the main number to the clinic Dept: [phone] and follow the prompts.   For any non-urgent questions, you may also contact your provider using MyChart. We now offer e-Visits for anyone 18 and older to request care online for non-urgent symptoms. For details visit [email or web address].   Also download the MyChart app! Go to the app store, search "MyChart", open the app, select , and log in with your MyChart username and password.  Masks are optional in the cancer centers. If you would like for your care team to wear a mask while they are taking care of you, please let them know. You may have one support person who is at least 63 years old accompany you for your appointments. 

## 2022-10-01 NOTE — Assessment & Plan Note (Signed)
She has mild anemia and thrombocytopenia due to treatment She is not symptomatic Observe only

## 2022-10-01 NOTE — Progress Notes (Signed)
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 Her side effects are manageable with recent changes in the dose and the timing of her treatment I plan to continue treatment for another 3 months I plan to repeat imaging study again in January  Pancytopenia, acquired [facility]) She has mild anemia and thrombocytopenia due to treatment She is not symptomatic Observe only  Nasal congestion She has mild nasal congestion, could be due to allergies We will proceed with treatment without delay  Bilateral leg edema She was extensively evaluated with no signs of DVT I suspect this is due to fluid retention from treatment I do not recommend long-term use of furosemide We discussed conservative approach with leg elevation and reduce salt intake  No orders of the defined types were placed in this encounter.   All questions were answered. The patient knows to call the clinic with any problems, questions or concerns. The total time spent in the appointment was 20 minutes encounter with patients including review of chart and various tests results, discussions about plan of care and coordination of care plan   [name], MD 10/01/2022 9:58 AM  INTERVAL HISTORY: Please see below for problem oriented charting. she returns for treatment follow-up She has recent nasal congestion She wonders if she needs to continue taking long-term furosemide for fluid retention in her legs Otherwise, she have no other complaints or side effects from treatment  REVIEW OF SYSTEMS:   Constitutional: Denies fevers, chills or abnormal weight loss Eyes: Denies blurriness of vision Ears, nose, mouth, throat, and face:  Denies mucositis or sore throat Respiratory: Denies cough, dyspnea or wheezes Cardiovascular: Denies palpitation, chest discomfort  Gastrointestinal:  Denies nausea, heartburn or change in bowel habits Skin: Denies abnormal skin rashes Lymphatics: Denies new lymphadenopathy or easy bruising Neurological:Denies numbness, tingling or new weaknesses Behavioral/Psych: Mood is stable, no new changes  All other systems were reviewed with the patient and are negative.  I have reviewed the past medical history, past surgical history, social history and family history with the patient and they are unchanged from previous note.  ALLERGIES:  has No Known Allergies.  MEDICATIONS:  Current Outpatient Medications  Medication Sig Dispense Refill   LORazepam (ATIVAN) 0.5 MG tablet Take 1 tablet (0.5 mg total) by mouth 2 (two) times daily as needed for anxiety. 3 tablet 0   acetaminophen (TYLENOL) 500 MG tablet Take 500 mg by mouth every 6 (six) hours as needed for mild pain, moderate pain or fever.     aspirin 81 MG EC tablet Take by mouth daily.     cetirizine (ZYRTEC) 10 MG tablet Take 10 mg by mouth daily.     Cholecalciferol (VITAMIN D3 PO) Take 1 tablet by mouth daily.     dexamethasone (DECADRON) 4 MG tablet Take 2 pills the day before chemo and daily for 2 days after chemo 30 tablet 0   lidocaine-prilocaine (EMLA) cream Apply 1 Application topically as needed. 30 g 3   losartan-hydrochlorothiazide (HYZAAR) 100-25 MG tablet Take 1 tablet by mouth daily.     omeprazole (PRILOSEC) 20 MG capsule Take 1 capsule (20 mg total) by mouth 2 (two) times daily. 28 capsule 0   ondansetron (ZOFRAN) 8 MG tablet  Take 1 tablet (8 mg total) by mouth every 8 (eight) hours as needed for nausea. 30 tablet 3   No current facility-administered medications for this visit.   Facility-Administered Medications Ordered in Other Visits  Medication Dose Route Frequency Provider Last Rate Last Admin   gemcitabine (GEMZAR)  1,292 mg in sodium chloride 0.9 % 250 mL chemo infusion  600 mg/m2 (Treatment Plan Recorded) Intravenous Once [name], MD 189 mL/hr at 10/01/22 0947 1,292 mg at 10/01/22 0947    SUMMARY OF ONCOLOGIC HISTORY: Oncology History Overview Note  High grade and LMS, 50% ER/PR positive dMMR normal, PD-L1 CPS 3%   Uterine sarcoma ([place])  06/02/2021 Imaging   MRI pelvis Heterogeneous 9.0 cm intrauterine mass within the intramural/submucosal anterior fundus demonstrating interval growth, internal enhancement, and restricted diffusion 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 No extra uterine extension. No evidence of metastatic disease within the pelvis.   Mild thickening of the endometrial stripe, likely related to entrapment by the adjacent uterine mass   06/03/2021 Initial Diagnosis   Uterine sarcoma ([place])   06/03/2021 Cancer Staging   Staging form: Corpus Uteri - Sarcoma, AJCC 7th Edition - Clinical stage from 06/03/2021: FIGO Stage IVB (rT1b, N0, M1) - Signed by [name], MD on 03/31/2022 Diagnostic confirmation: Positive histology Stage prefix: Recurrence Biopsy of metastatic site performed: No Lymph-vascular invasion (LVI): LVI present/identified, NOS   06/03/2021 Pathology Results   FINAL MICROSCOPIC DIAGNOSIS:   A. UTERUS, CERVIX, BILATERAL FALLOPIAN TUBES AND OVARIES, HYSTERECTOMY:  - Uterus:       Mixed high grade uterine sarcoma, spanning 6 cm, see comment.       Extensive lymphovascular invasion.       See oncology table.  - Cervix: Benign squamous and endocervical mucosa. No dysplasia or  malignancy.  - Bilateral ovaries: Unremarkable. No malignancy.  - Bilateral fallopian tubes: Unremarkable. No malignancy.   ONCOLOGY TABLE:   UTERUS, SARCOMA: Resection   Procedure: Total hysterectomy and bilateral salpingo-oophorectomy  Specimen Integrity: Focally disrupted on posterior surface  Tumor Site: Anterior wall  Tumor Size: 6 cm   Histologic Type: High grade sarcoma, mixed. See comment.  Other Tissue/ Organ Involvement: Not identified  Lymphovascular Invasion: Present, extensive.  Margins: All margins negative for tumor  Regional Lymph Nodes: Not applicable (no lymph nodes submitted or found)  Distant Metastasis:       Distant Site(s) Involved: Not applicable  Pathologic Stage Classification (pTNM, AJCC 8th Edition): pT1b, pN not  assigned  Ancillary Studies: Can be performed upon request  Representative Tumor Block: A6  Comment(s): The tumor has two morphologically different components. One component consists of malignant spindle cells which can be seen arising from the smooth muscle. These foci are positive for SMA, desmin, cyclinD1 (focal), and CD10 (patchy). The other component consists of round to slightly spindle cells with abundant admixed vessels and occasional large pleomorphic cells. These areas are positive for SMA (patchy weak), desmin (focal), CD10 (variable with diffuse areas). Pancytokeratin is negative. 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   ADDENDUM:   PROGNOSTIC INDICATOR RESULTS:   Immunohistochemical and morphometric analysis performed manually    Estrogen Receptor:       POSITIVE, 50%, WEAK TO MODERATE STAINING  Progesterone Receptor:   POSITIVE, 50%, MODERATE TO STRONG STAINING   Reference Range Estrogen and Progesterone Receptor       Negative  0%       Positive  >  1%    06/03/2021 Surgery   Surgeon: Donaciano Eva    Assistants: Dr Lahoma Crocker (an MD assistant was necessary for tissue manipulation, management of robotic instrumentation, retraction and positioning due to the complexity of the case and hospital policies).  Operation: Robotic-assisted laparoscopic total hysterectomy >250gm with bilateral salpingoophorectomy, minilaparotomy for specimen delivery   Surgeon: Donaciano Eva     Operative Findings:  : Bulky 16cm uterus with intra-uterine mass (consistent with a fibroid-like mass) , smooth normal appearing serosa, no suspicious bulky nodes. Normal ovaries bilaterally. Normal upper abdomen. Uterus too large to deliver vaginally in tact.    06/18/2021 Imaging   1. Multiple bilateral pulmonary nodules, measuring up to 9 mm. Most of these are perifissural and subpleural in location, likely lymph nodes. Nevertheless, close follow-up recommended to exclude metastatic disease. 2. 11 mm hypoattenuating lesion towards the dome of the liver, indeterminate. MRI abdomen with and without contrast could be used to further evaluate as clinically warranted. 3. Aortic Atherosclerosis (ICD10-I70.0).   10/13/2021 Imaging   1. No acute findings within the abdomen or pelvis. No specific findings identified to suggest residual or recurrence of tumor. 2. Stable small pulmonary nodules within the left lower lobe.   03/18/2022 Imaging   1. Interval development of 2 soft tissue nodules in the anterior pelvis measuring 10 mm and 7 mm respectively. Close follow-up recommended to exclude metastatic disease. PET-CT may be warranted to further evaluate. 2. Development of 2 mm left upper lobe parenchymal nodule, nonspecific. Close attention on follow-up imaging recommended  3. Stable appearance of index bilateral pulmonary nodules identified previously. 4. Aortic Atherosclerosis (ICD10-I70.0).   04/02/2022 PET scan   1. Signs of local tumor recurrence noted at the vaginal cuff. 2. 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 New since 10/13/2021 and progressive when compared with 03/17/2022. 3. New focal area of increased uptake within the right biceps femoris muscle which is suspicious for skeletal muscle involvement. 4. Stable appearance of small pulmonary nodules described on 03/17/2022. These are too small to characterize by PET-CT.   04/09/2022 Procedure    Successful placement of a right IJ approach Power Port with ultrasound and fluoroscopic guidance. The catheter is ready for use.   04/09/2022 Echocardiogram    1. Left ventricular ejection fraction, by estimation, is 65 to 70%. The left ventricle has normal function. The left ventricle has no regional wall motion abnormalities. There is mild concentric left ventricular hypertrophy. Left ventricular diastolic parameters are consistent with Grade I diastolic dysfunction (impaired relaxation).  2. Right ventricular systolic function is normal. The right ventricular size is normal.  3. The mitral valve is normal in structure. No evidence of mitral valve regurgitation.  4. The aortic valve is tricuspid. Aortic valve regurgitation is not visualized. No aortic stenosis is present.  5. The inferior vena cava is normal in size with greater than 50% respiratory variability, suggesting right atrial pressure of 3 mmHg.   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 Status post hysterectomy and bilateral oophorectomy. Multiple newand enlarged peritoneal nodules throughout the low abdomen and pelvis, previously FDG avid and consistent with worsened locally recurrent and peritoneal metastatic disease. 2. Multiple small bilateral pulmonary nodules, several of which are slightly enlarged compared to prior examination, consistent with worsened pulmonary metastatic disease.   Aortic Atherosclerosis (ICD10-I70.0).     06/25/2022 - 07/06/2022  Chemotherapy   Patient is on Treatment Plan : UTERINE UNDIFFERENTIATED / LEIOMYOSARCOMA Gemcitabine D1,8 + Docetaxel D8 (900/100) q21d     06/25/2022 -  Chemotherapy   Patient is on Treatment Plan : UTERINE UNDIFFERENTIATED LEIOMYOSARCOMA Gemcitabine D1,8 + Docetaxel D8 (900/100) q21d     09/02/2022 Imaging   1. Moderate response to therapy of peritoneal metastasis within the  anterior pelvis. 2. Minimal response to therapy of pulmonary metastasis. 3. No new or progressive disease. 4.  Aortic Atherosclerosis (ICD10-I70.0).       PHYSICAL EXAMINATION: ECOG PERFORMANCE STATUS: 1 - Symptomatic but completely ambulatory  Vitals:   10/01/22 0812  BP: 127/70  Pulse: (!) 120  Resp: 18  Temp: 98.3 F (36.8 C)  SpO2: 100%   Filed Weights   10/01/22 0812  Weight: 209 lb 3.2 oz (94.9 kg)    GENERAL:alert, no distress and comfortable  NEURO: alert & oriented x 3 with fluent speech, no focal motor/sensory deficits  LABORATORY DATA:  I have reviewed the data as listed    Component Value Date/Time   NA 134 (L) 10/01/2022 0751   K 3.7 10/01/2022 0751   CL 99 10/01/2022 0751   CO2 28 10/01/2022 0751   GLUCOSE 108 (H) 10/01/2022 0751   BUN 14 10/01/2022 0751   CREATININE 1.04 (H) 10/01/2022 0751   CALCIUM 8.7 (L) 10/01/2022 0751   PROT 5.9 (L) 10/01/2022 0751   ALBUMIN 3.3 (L) 10/01/2022 0751   AST 23 10/01/2022 0751   ALT 16 10/01/2022 0751   ALKPHOS 138 (H) 10/01/2022 0751   BILITOT 0.4 10/01/2022 0751   GFRNONAA >60 10/01/2022 0751    No results found for: "SPEP", "UPEP"  Lab Results  Component Value Date   WBC 29.9 (H) 10/01/2022   NEUTROABS 22.0 (H) 10/01/2022   HGB 10.4 (L) 10/01/2022   HCT 30.6 (L) 10/01/2022   MCV 99.0 10/01/2022   PLT 145 (L) 10/01/2022      Chemistry      Component Value Date/Time   NA 134 (L) 10/01/2022 0751   K 3.7 10/01/2022 0751   CL 99 10/01/2022 0751   CO2 28 10/01/2022 0751   BUN 14 10/01/2022 0751   CREATININE 1.04 (H) 10/01/2022 0751      Component Value Date/Time   CALCIUM 8.7 (L) 10/01/2022 0751   ALKPHOS 138 (H) 10/01/2022 0751   AST 23 10/01/2022 0751   ALT 16 10/01/2022 0751   BILITOT 0.4 10/01/2022 0751

## 2022-10-03 ENCOUNTER — Other Ambulatory Visit: Payer: Self-pay

## 2022-10-14 MED FILL — Dexamethasone Sodium Phosphate Inj 100 MG/10ML: INTRAMUSCULAR | Qty: 1 | Status: AC

## 2022-10-15 ENCOUNTER — Other Ambulatory Visit (HOSPITAL_COMMUNITY): Payer: Self-pay

## 2022-10-15 ENCOUNTER — Inpatient Hospital Stay: Payer: Managed Care, Other (non HMO)

## 2022-10-15 ENCOUNTER — Encounter: Payer: Self-pay | Admitting: Hematology and Oncology

## 2022-10-15 ENCOUNTER — Other Ambulatory Visit: Payer: Self-pay

## 2022-10-15 ENCOUNTER — Telehealth: Payer: Self-pay

## 2022-10-15 VITALS — BP 123/68 | HR 113 | Temp 98.0°F | Resp 16 | Wt 211.5 lb

## 2022-10-15 DIAGNOSIS — Z5111 Encounter for antineoplastic chemotherapy: Secondary | ICD-10-CM | POA: Diagnosis not present

## 2022-10-15 DIAGNOSIS — C549 Malignant neoplasm of corpus uteri, unspecified: Secondary | ICD-10-CM

## 2022-10-15 LAB — CBC WITH DIFFERENTIAL (CANCER CENTER ONLY)
Abs Immature Granulocytes: 0.04 10*3/uL (ref 0.00–0.07)
Basophils Absolute: 0 10*3/uL (ref 0.0–0.1)
Basophils Relative: 1 %
Eosinophils Absolute: 0.2 10*3/uL (ref 0.0–0.5)
Eosinophils Relative: 3 %
HCT: 26.8 % — ABNORMAL LOW (ref 36.0–46.0)
Hemoglobin: 9.1 g/dL — ABNORMAL LOW (ref 12.0–15.0)
Immature Granulocytes: 1 %
Lymphocytes Relative: 32 %
Lymphs Abs: 2.1 10*3/uL (ref 0.7–4.0)
MCH: 34.3 pg — ABNORMAL HIGH (ref 26.0–34.0)
MCHC: 34 g/dL (ref 30.0–36.0)
MCV: 101.1 fL — ABNORMAL HIGH (ref 80.0–100.0)
Monocytes Absolute: 1.1 10*3/uL — ABNORMAL HIGH (ref 0.1–1.0)
Monocytes Relative: 17 %
Neutro Abs: 3.2 10*3/uL (ref 1.7–7.7)
Neutrophils Relative %: 46 %
Platelet Count: 460 10*3/uL — ABNORMAL HIGH (ref 150–400)
RBC: 2.65 MIL/uL — ABNORMAL LOW (ref 3.87–5.11)
RDW: 21.5 % — ABNORMAL HIGH (ref 11.5–15.5)
WBC Count: 6.7 10*3/uL (ref 4.0–10.5)
nRBC: 0.3 % — ABNORMAL HIGH (ref 0.0–0.2)

## 2022-10-15 LAB — CMP (CANCER CENTER ONLY)
ALT: 24 U/L (ref 0–44)
AST: 30 U/L (ref 15–41)
Albumin: 3.3 g/dL — ABNORMAL LOW (ref 3.5–5.0)
Alkaline Phosphatase: 74 U/L (ref 38–126)
Anion gap: 8 (ref 5–15)
BUN: 17 mg/dL (ref 8–23)
CO2: 28 mmol/L (ref 22–32)
Calcium: 9.1 mg/dL (ref 8.9–10.3)
Chloride: 100 mmol/L (ref 98–111)
Creatinine: 1.03 mg/dL — ABNORMAL HIGH (ref 0.44–1.00)
GFR, Estimated: >60 mL/min (ref >60)
Glucose, Bld: 102 mg/dL — ABNORMAL HIGH (ref 70–99)
Potassium: 3.7 mmol/L (ref 3.5–5.1)
Sodium: 136 mmol/L (ref 135–145)
Total Bilirubin: 0.5 mg/dL (ref 0.3–1.2)
Total Protein: 5.9 g/dL — ABNORMAL LOW (ref 6.5–8.1)

## 2022-10-15 MED ORDER — SODIUM CHLORIDE 0.9% FLUSH
10.0000 mL | Freq: Once | INTRAVENOUS | Status: AC
  Administered 2022-10-15: 10 mL

## 2022-10-15 MED ORDER — SODIUM CHLORIDE 0.9% FLUSH
10.0000 mL | INTRAVENOUS | Status: DC | PRN
  Administered 2022-10-15: 10 mL

## 2022-10-15 MED ORDER — HEPARIN SOD (PORK) LOCK FLUSH 100 UNIT/ML IV SOLN
500.0000 [IU] | Freq: Once | INTRAVENOUS | Status: AC | PRN
  Administered 2022-10-15: 500 [IU]

## 2022-10-15 MED ORDER — SODIUM CHLORIDE 0.9 % IV SOLN
75.0000 mg/m2 | Freq: Once | INTRAVENOUS | Status: AC
  Administered 2022-10-15: 160 mg via INTRAVENOUS
  Filled 2022-10-15: qty 16

## 2022-10-15 MED ORDER — SODIUM CHLORIDE 0.9 % IV SOLN
600.0000 mg/m2 | Freq: Once | INTRAVENOUS | Status: AC
  Administered 2022-10-15: 1292 mg via INTRAVENOUS
  Filled 2022-10-15: qty 33.98

## 2022-10-15 MED ORDER — MAGIC MOUTHWASH W/LIDOCAINE: 5.0000 mL | Freq: Four times a day (QID) | ORAL | 0 refills | Status: AC

## 2022-10-15 MED ORDER — NYSTATIN 100000 UNIT/ML MT SUSP
5.0000 mL | Freq: Four times a day (QID) | OROMUCOSAL | 0 refills | Status: DC
  Filled 2022-10-15: qty 150, 7d supply, fill #0

## 2022-10-15 MED ORDER — PROCHLORPERAZINE MALEATE 10 MG PO TABS
10.0000 mg | ORAL_TABLET | Freq: Once | ORAL | Status: AC
  Administered 2022-10-15: 10 mg via ORAL
  Filled 2022-10-15: qty 1

## 2022-10-15 MED ORDER — SODIUM CHLORIDE 0.9 % IV SOLN
10.0000 mg | Freq: Once | INTRAVENOUS | Status: AC
  Administered 2022-10-15: 10 mg via INTRAVENOUS
  Filled 2022-10-15: qty 10

## 2022-10-15 MED ORDER — SODIUM CHLORIDE 0.9 % IV SOLN: Freq: Once | INTRAVENOUS | Status: AC

## 2022-10-15 NOTE — Telephone Encounter (Signed)
She called and left a message. She is asking for a refill of magic mouth wash to [facility]. She is in infusion today.

## 2022-10-15 NOTE — Telephone Encounter (Signed)
Called back and told Rx sent to [facility]. She verbralzied understanding.

## 2022-10-15 NOTE — Progress Notes (Signed)
Per Dr [name], ok to treat today with elevated HR.

## 2022-10-15 NOTE — Telephone Encounter (Signed)
Pls call in MMW with lidocaine, swish and spit 4 x daily for 7 days no refills

## 2022-10-15 NOTE — Patient Instructions (Signed)
[facility]   Discharge Instructions: Thank you for choosing [facility] to provide your oncology and hematology care.   If you have a lab appointment with the Cancer Center, please go directly to the Cancer Center and check in at the registration area.   Wear comfortable clothing and clothing appropriate for easy access to any Portacath or PICC line.   We strive to give you quality time with your provider. You may need to reschedule your appointment if you arrive late (15 or more minutes).  Arriving late affects you and other patients whose appointments are after yours.  Also, if you miss three or more appointments without notifying the office, you may be dismissed from the clinic at the provider's discretion.      For prescription refill requests, have your pharmacy contact our office and allow 72 hours for refills to be completed.    Today you received the following chemotherapy and/or immunotherapy agents: gemcitabine and docetaxel      To help prevent nausea and vomiting after your treatment, we encourage you to take your nausea medication as directed.  BELOW ARE SYMPTOMS THAT SHOULD BE REPORTED IMMEDIATELY: *FEVER GREATER THAN 100.4 F (38 C) OR HIGHER *CHILLS OR SWEATING *NAUSEA AND VOMITING THAT IS NOT CONTROLLED WITH YOUR NAUSEA MEDICATION *UNUSUAL SHORTNESS OF BREATH *UNUSUAL BRUISING OR BLEEDING *URINARY PROBLEMS (pain or burning when urinating, or frequent urination) *BOWEL PROBLEMS (unusual diarrhea, constipation, pain near the anus) TENDERNESS IN MOUTH AND THROAT WITH OR WITHOUT PRESENCE OF ULCERS (sore throat, sores in mouth, or a toothache) UNUSUAL RASH, SWELLING OR PAIN  UNUSUAL VAGINAL DISCHARGE OR ITCHING   Items with * indicate a potential emergency and should be followed up as soon as possible or go to the Emergency Department if any problems should occur.  Please show the CHEMOTHERAPY ALERT CARD or IMMUNOTHERAPY ALERT  CARD at check-in to the Emergency Department and triage nurse.  Should you have questions after your visit or need to cancel or reschedule your appointment, please contact [facility]  Dept: [phone]  and follow the prompts.  Office hours are 8:00 a.m. to 4:30 p.m. Monday - Friday. Please note that voicemails left after 4:00 p.m. may not be returned until the following business day.  We are closed weekends and major holidays. You have access to a nurse at all times for urgent questions. Please call the main number to the clinic Dept: [phone] and follow the prompts.   For any non-urgent questions, you may also contact your provider using MyChart. We now offer e-Visits for anyone 18 and older to request care online for non-urgent symptoms. For details visit [email or web address].   Also download the MyChart app! Go to the app store, search "MyChart", open the app, select [facility], and log in with your MyChart username and password.  Masks are optional in the cancer centers. If you would like for your care team to wear a mask while they are taking care of you, please let them know. You may have one support person who is at least 63 years old accompany you for your appointments. 

## 2022-10-17 ENCOUNTER — Inpatient Hospital Stay: Payer: Managed Care, Other (non HMO) | Attending: Gynecologic Oncology

## 2022-10-17 VITALS — BP 120/69 | HR 112 | Temp 97.5°F | Resp 7

## 2022-10-17 DIAGNOSIS — C549 Malignant neoplasm of corpus uteri, unspecified: Secondary | ICD-10-CM

## 2022-10-17 DIAGNOSIS — Z5189 Encounter for other specified aftercare: Secondary | ICD-10-CM | POA: Insufficient documentation

## 2022-10-17 DIAGNOSIS — D61818 Other pancytopenia: Secondary | ICD-10-CM | POA: Insufficient documentation

## 2022-10-17 DIAGNOSIS — C55 Malignant neoplasm of uterus, part unspecified: Secondary | ICD-10-CM | POA: Diagnosis not present

## 2022-10-17 DIAGNOSIS — Z5111 Encounter for antineoplastic chemotherapy: Secondary | ICD-10-CM | POA: Diagnosis present

## 2022-10-17 DIAGNOSIS — D6481 Anemia due to antineoplastic chemotherapy: Secondary | ICD-10-CM | POA: Insufficient documentation

## 2022-10-17 MED ORDER — PEGFILGRASTIM INJECTION 6 MG/0.6ML ~~LOC~~
6.0000 mg | PREFILLED_SYRINGE | Freq: Once | SUBCUTANEOUS | Status: AC
  Administered 2022-10-17: 6 mg via SUBCUTANEOUS
  Filled 2022-10-17: qty 0.6

## 2022-10-28 ENCOUNTER — Other Ambulatory Visit: Payer: Self-pay

## 2022-10-28 DIAGNOSIS — C549 Malignant neoplasm of corpus uteri, unspecified: Secondary | ICD-10-CM

## 2022-10-29 ENCOUNTER — Inpatient Hospital Stay: Payer: Managed Care, Other (non HMO)

## 2022-10-29 ENCOUNTER — Inpatient Hospital Stay: Payer: Managed Care, Other (non HMO) | Admitting: Hematology and Oncology

## 2022-10-29 ENCOUNTER — Encounter: Payer: Self-pay | Admitting: Hematology and Oncology

## 2022-10-29 VITALS — BP 119/72

## 2022-10-29 VITALS — BP 119/22 | HR 122 | Temp 97.4°F | Resp 18 | Ht 67.5 in | Wt 215.4 lb

## 2022-10-29 DIAGNOSIS — E440 Moderate protein-calorie malnutrition: Secondary | ICD-10-CM

## 2022-10-29 DIAGNOSIS — Z5111 Encounter for antineoplastic chemotherapy: Secondary | ICD-10-CM | POA: Diagnosis not present

## 2022-10-29 DIAGNOSIS — C549 Malignant neoplasm of corpus uteri, unspecified: Secondary | ICD-10-CM | POA: Diagnosis not present

## 2022-10-29 DIAGNOSIS — I10 Essential (primary) hypertension: Secondary | ICD-10-CM | POA: Diagnosis not present

## 2022-10-29 DIAGNOSIS — D6481 Anemia due to antineoplastic chemotherapy: Secondary | ICD-10-CM

## 2022-10-29 DIAGNOSIS — T451X5A Adverse effect of antineoplastic and immunosuppressive drugs, initial encounter: Secondary | ICD-10-CM

## 2022-10-29 DIAGNOSIS — R600 Localized edema: Secondary | ICD-10-CM | POA: Diagnosis not present

## 2022-10-29 LAB — CMP (CANCER CENTER ONLY)
ALT: 12 U/L (ref 0–44)
AST: 23 U/L (ref 15–41)
Albumin: 2.9 g/dL — ABNORMAL LOW (ref 3.5–5.0)
Alkaline Phosphatase: 124 U/L (ref 38–126)
Anion gap: 8 (ref 5–15)
BUN: 15 mg/dL (ref 8–23)
CO2: 27 mmol/L (ref 22–32)
Calcium: 8.7 mg/dL — ABNORMAL LOW (ref 8.9–10.3)
Chloride: 102 mmol/L (ref 98–111)
Creatinine: 0.94 mg/dL (ref 0.44–1.00)
GFR, Estimated: >60 mL/min (ref >60)
Glucose, Bld: 110 mg/dL — ABNORMAL HIGH (ref 70–99)
Potassium: 3.9 mmol/L (ref 3.5–5.1)
Sodium: 137 mmol/L (ref 135–145)
Total Bilirubin: 0.4 mg/dL (ref 0.3–1.2)
Total Protein: 5.1 g/dL — ABNORMAL LOW (ref 6.5–8.1)

## 2022-10-29 LAB — SAMPLE TO BLOOD BANK

## 2022-10-29 LAB — CBC WITH DIFFERENTIAL (CANCER CENTER ONLY)
Abs Immature Granulocytes: 3.41 10*3/uL — ABNORMAL HIGH (ref 0.00–0.07)
Basophils Absolute: 0.2 10*3/uL — ABNORMAL HIGH (ref 0.0–0.1)
Basophils Relative: 1 %
Eosinophils Absolute: 0.1 10*3/uL (ref 0.0–0.5)
Eosinophils Relative: 0 %
HCT: 29.1 % — ABNORMAL LOW (ref 36.0–46.0)
Hemoglobin: 9.9 g/dL — ABNORMAL LOW (ref 12.0–15.0)
Immature Granulocytes: 11 %
Lymphocytes Relative: 9 %
Lymphs Abs: 2.7 10*3/uL (ref 0.7–4.0)
MCH: 35.1 pg — ABNORMAL HIGH (ref 26.0–34.0)
MCHC: 34 g/dL (ref 30.0–36.0)
MCV: 103.2 fL — ABNORMAL HIGH (ref 80.0–100.0)
Monocytes Absolute: 2.6 10*3/uL — ABNORMAL HIGH (ref 0.1–1.0)
Monocytes Relative: 9 %
Neutro Abs: 21.3 10*3/uL — ABNORMAL HIGH (ref 1.7–7.7)
Neutrophils Relative %: 70 %
Platelet Count: 142 10*3/uL — ABNORMAL LOW (ref 150–400)
RBC: 2.82 MIL/uL — ABNORMAL LOW (ref 3.87–5.11)
RDW: 21.8 % — ABNORMAL HIGH (ref 11.5–15.5)
WBC Count: 30.3 10*3/uL — ABNORMAL HIGH (ref 4.0–10.5)
nRBC: 0.4 % — ABNORMAL HIGH (ref 0.0–0.2)

## 2022-10-29 MED ORDER — SODIUM CHLORIDE 0.9 % IV SOLN: Freq: Once | INTRAVENOUS | Status: AC

## 2022-10-29 MED ORDER — PROCHLORPERAZINE MALEATE 10 MG PO TABS
10.0000 mg | ORAL_TABLET | Freq: Once | ORAL | Status: AC
  Administered 2022-10-29: 10 mg via ORAL
  Filled 2022-10-29: qty 1

## 2022-10-29 MED ORDER — SODIUM CHLORIDE 0.9% FLUSH
10.0000 mL | Freq: Once | INTRAVENOUS | Status: AC
  Administered 2022-10-29: 10 mL

## 2022-10-29 MED ORDER — LORAZEPAM 0.5 MG PO TABS: 0.5000 mg | ORAL_TABLET | Freq: Two times a day (BID) | ORAL | 0 refills | Status: DC | PRN

## 2022-10-29 MED ORDER — LOSARTAN POTASSIUM-HCTZ 100-25 MG PO TABS: 0.5000 | ORAL_TABLET | Freq: Every day | ORAL | Status: DC

## 2022-10-29 MED ORDER — LIDOCAINE-PRILOCAINE 2.5-2.5 % EX CREA: 1.0000 | TOPICAL_CREAM | CUTANEOUS | 3 refills | Status: DC | PRN

## 2022-10-29 MED ORDER — SODIUM CHLORIDE 0.9 % IV SOLN
600.0000 mg/m2 | Freq: Once | INTRAVENOUS | Status: AC
  Administered 2022-10-29: 1292 mg via INTRAVENOUS
  Filled 2022-10-29: qty 33.98

## 2022-10-29 NOTE — Assessment & Plan Note (Signed)
She tolerated treatment poorly due to excessive fatigue, poor nutritional status, tachycardia, low blood pressure and leg edema We will proceed with treatment as scheduled We have modified her treatment to be given every other week with further modification, adjusted further due to the upcoming holidays She will get CT imaging done on January 16 for objective assessment of response to therapy

## 2022-10-29 NOTE — Progress Notes (Signed)
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 due to excessive fatigue, poor nutritional status, tachycardia, low blood pressure and leg edema We will proceed with treatment as scheduled We have modified her treatment to be given every other week with further modification, adjusted further due to the upcoming holidays She will get CT imaging done on January 16 for objective assessment of response to therapy  Bilateral leg edema This is multifactorial, likely due to her low protein status causing third spacing and poor mobility during treatment I recommend leg elevation and dietary modification I am hesitant to recommend further diuretic therapy due to risk of dehydration  Essential hypertension Her blood pressure has been running low and she is tachycardic I suspect she is clinically dehydrated Her oral intake is not great I recommend checking her blood pressure carefully and reduce her diuretic to half a tablet over the next few weeks  Protein-calorie malnutrition, moderate ([place]) Her dietary oral protein intake is low She has declining values of total protein and albumin We discussed importance of dietary changes while on treatment  Anemia due to antineoplastic chemotherapy Her imaging anemia has improved with recent changes to her treatment plan She does not need blood transfusion today   Orders Placed This Encounter  Procedures   CT CHEST ABDOMEN PELVIS W CONTRAST    Standing Status:   Future    Standing Expiration Date:   10/30/2023    Order Specific Question:   Preferred imaging location?    Answer:   [facility]    Order Specific Question:   Radiology Contrast Protocol - do NOT remove file path     Answer:   [email or web address]   CBC with Differential (Cancer Center Only)    Standing Status:   Future    Standing Expiration Date:   12/04/2023   CMP ([place] only)    Standing Status:   Future    Standing Expiration Date:   12/04/2023   CBC with Differential ([place] Only)    Standing Status:   Future    Standing Expiration Date:   12/18/2023   CMP ([place] only)    Standing Status:   Future    Standing Expiration Date:   12/18/2023    All questions were answered. The patient knows to call the clinic with any problems, questions or concerns. The total time spent in the appointment was 30 minutes encounter with patients including review of chart and various tests results, discussions about plan of care and coordination of care plan   [name], MD 10/29/2022 9:46 AM  INTERVAL HISTORY: Please see below for problem oriented charting. she returns for treatment follow-up  She felt weak She has bilateral lower extremity edema Denies nausea or changes in bowel habits No worsening peripheral neuropathy  REVIEW OF SYSTEMS:   Constitutional: Denies fevers, chills or abnormal weight loss Eyes: Denies blurriness of vision Ears, nose, mouth, throat, and face: Denies mucositis or sore throat Respiratory: Denies cough, dyspnea or wheezes Cardiovascular: Denies palpitation, chest discomfort  Gastrointestinal:  Denies nausea, heartburn or change in bowel habits Skin: Denies abnormal skin rashes Lymphatics: Denies new lymphadenopathy or easy bruising Behavioral/Psych: Mood is stable, no new changes  All other systems were reviewed with the patient and  are negative.  I have reviewed the past medical history, past surgical history, social history and family history with the patient and they are unchanged from previous note.  ALLERGIES:  has No Known Allergies.  MEDICATIONS:  Current Outpatient Medications  Medication Sig Dispense Refill    acetaminophen (TYLENOL) 500 MG tablet Take 500 mg by mouth every 6 (six) hours as needed for mild pain, moderate pain or fever.     aspirin 81 MG EC tablet Take by mouth daily.     cetirizine (ZYRTEC) 10 MG tablet Take 10 mg by mouth daily.     Cholecalciferol (VITAMIN D3 PO) Take 1 tablet by mouth daily.     dexamethasone (DECADRON) 4 MG tablet Take 2 pills the day before chemo and daily for 2 days after chemo 30 tablet 0   lidocaine-prilocaine (EMLA) cream Apply 1 Application topically as needed. 30 g 3   LORazepam (ATIVAN) 0.5 MG tablet Take 1 tablet (0.5 mg total) by mouth 2 (two) times daily as needed for anxiety. 3 tablet 0   losartan-hydrochlorothiazide (HYZAAR) 100-25 MG tablet Take 0.5 tablets by mouth daily.     magic mouthwash (nystatin, lidocaine, diphenhydrAMINE, alum & mag hydroxide) suspension Swish and swallow 5 mLs by mouth 4 (four) times daily for 7 days 150 mL 0   omeprazole (PRILOSEC) 20 MG capsule Take 1 capsule (20 mg total) by mouth 2 (two) times daily. 28 capsule 0   ondansetron (ZOFRAN) 8 MG tablet Take 1 tablet (8 mg total) by mouth every 8 (eight) hours as needed for nausea. 30 tablet 3   No current facility-administered medications for this visit.   Facility-Administered Medications Ordered in Other Visits  Medication Dose Route Frequency Provider Last Rate Last Admin   gemcitabine (GEMZAR) 1,292 mg in sodium chloride 0.9 % 250 mL chemo infusion  600 mg/m2 (Treatment Plan Recorded) Intravenous Once [name], MD 284 mL/hr at 10/29/22 0938 1,292 mg at 10/29/22 5681    SUMMARY OF ONCOLOGIC HISTORY: Oncology History Overview Note  High grade and LMS, 50% ER/PR positive dMMR normal, PD-L1 CPS 3%   Uterine sarcoma ([place])  06/02/2021 Imaging   MRI pelvis Heterogeneous 9.0 cm intrauterine mass within the intramural/submucosal anterior fundus demonstrating interval growth, internal enhancement, and restricted diffusion suspicious for an intrauterine leiomyosarcoma in  this postmenopausal patient. No extra uterine extension. No evidence of metastatic disease within the pelvis.   Mild thickening of the endometrial stripe, likely related to entrapment by the adjacent uterine mass   06/03/2021 Initial Diagnosis   Uterine sarcoma ([place])   06/03/2021 Cancer Staging   Staging form: Corpus Uteri - Sarcoma, AJCC 7th Edition - Clinical stage from 06/03/2021: FIGO Stage IVB (rT1b, N0, M1) - Signed by [name], MD on 03/31/2022 Diagnostic confirmation: Positive histology Stage prefix: Recurrence Biopsy of metastatic site performed: No Lymph-vascular invasion (LVI): LVI present/identified, NOS   06/03/2021 Pathology Results   FINAL MICROSCOPIC DIAGNOSIS:   A. UTERUS, CERVIX, BILATERAL FALLOPIAN TUBES AND OVARIES, HYSTERECTOMY:  - Uterus:       Mixed high grade uterine sarcoma, spanning 6 cm, see comment.       Extensive lymphovascular invasion.       See oncology table.  - Cervix: Benign squamous and endocervical mucosa. No dysplasia or  malignancy.  - Bilateral ovaries: Unremarkable. No malignancy.  - Bilateral fallopian tubes: Unremarkable. No malignancy.   ONCOLOGY TABLE:   UTERUS, SARCOMA: Resection   Procedure: Total hysterectomy and bilateral salpingo-oophorectomy  Specimen Integrity: Focally disrupted  on posterior surface  Tumor Site: Anterior wall  Tumor Size: 6 cm  Histologic Type: High grade sarcoma, mixed. See comment.  Other Tissue/ Organ Involvement: Not identified  Lymphovascular Invasion: Present, extensive.  Margins: All margins negative for tumor  Regional Lymph Nodes: Not applicable (no lymph nodes submitted or found)  Distant Metastasis:       Distant Site(s) Involved: Not applicable  Pathologic Stage Classification (pTNM, AJCC 8th Edition): pT1b, pN not  assigned  Ancillary Studies: Can be performed upon request  Representative Tumor Block: A6  Comment(s): The tumor has two morphologically different components. One component  consists of malignant spindle cells which can be seen arising from the smooth muscle. These foci are positive for SMA, desmin, cyclinD1 (focal), and CD10 (patchy). The other component consists of round to slightly spindle cells with abundant admixed vessels and occasional large pleomorphic cells. These areas are positive for SMA (patchy weak), desmin (focal), CD10 (variable with diffuse areas). Pancytokeratin is negative. 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   ADDENDUM:   PROGNOSTIC INDICATOR RESULTS:   Immunohistochemical and morphometric analysis performed manually    Estrogen Receptor:       POSITIVE, 50%, WEAK TO MODERATE STAINING  Progesterone Receptor:   POSITIVE, 50%, MODERATE TO STRONG STAINING   Reference Range Estrogen and Progesterone Receptor       Negative  0%       Positive  >1%    06/03/2021 Surgery   Surgeon: Donaciano Eva    Assistants: Dr Lahoma Crocker (an MD assistant was necessary for tissue manipulation, management of robotic instrumentation, retraction and positioning due to the complexity of the case and hospital policies).  Operation: Robotic-assisted laparoscopic total hysterectomy >250gm with bilateral salpingoophorectomy, minilaparotomy for specimen delivery   Surgeon: Donaciano Eva    Operative Findings:  : Bulky 16cm uterus with intra-uterine mass (consistent with a fibroid-like mass) , smooth normal appearing serosa, no suspicious bulky nodes. Normal ovaries bilaterally. Normal upper abdomen. Uterus too large to deliver vaginally in tact.    06/18/2021 Imaging   1. Multiple bilateral pulmonary nodules, measuring up to 9 mm. Most of these are perifissural and subpleural in location, likely lymph nodes. Nevertheless, close follow-up recommended to exclude metastatic disease. 2. 11 mm hypoattenuating lesion towards the dome of the liver, indeterminate. MRI abdomen with  and without contrast could be used to further evaluate as clinically warranted. 3. Aortic Atherosclerosis (ICD10-I70.0).   10/13/2021 Imaging   1. No acute findings within the abdomen or pelvis. No specific findings identified to suggest residual or recurrence of tumor. 2. Stable small pulmonary nodules within the left lower lobe.   03/18/2022 Imaging   1. Interval development of 2 soft tissue nodules in the anterior pelvis measuring 10 mm and 7 mm respectively. Close follow-up recommended to exclude metastatic disease. PET-CT may be warranted to further evaluate. 2. Development of 2 mm left upper lobe parenchymal nodule, nonspecific. Close attention on follow-up imaging recommended  3. Stable appearance of index bilateral pulmonary nodules identified previously. 4. Aortic Atherosclerosis (ICD10-I70.0).   04/02/2022 PET scan   1. Signs of local tumor recurrence noted at the vaginal cuff. 2. 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 New since 10/13/2021 and progressive when compared with 03/17/2022. 3. New focal area of increased uptake within the right biceps femoris muscle which is suspicious for skeletal muscle involvement. 4. Stable appearance of small pulmonary nodules described on  03/17/2022. These are too small to characterize by PET-CT.   04/09/2022 Procedure   Successful placement of a right IJ approach Power Port with ultrasound and fluoroscopic guidance. The catheter is ready for use.   04/09/2022 Echocardiogram    1. Left ventricular ejection fraction, by estimation, is 65 to 70%. The left ventricle has normal function. The left ventricle has no regional wall motion abnormalities. There is mild concentric left ventricular hypertrophy. Left ventricular diastolic parameters are consistent with Grade I diastolic dysfunction (impaired relaxation).  2. Right ventricular systolic function is normal. The right ventricular size is normal.  3. The  mitral valve is normal in structure. No evidence of mitral valve regurgitation.  4. The aortic valve is tricuspid. Aortic valve regurgitation is not visualized. No aortic stenosis is present.  5. The inferior vena cava is normal in size with greater than 50% respiratory variability, suggesting right atrial pressure of 3 mmHg.   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 Status post hysterectomy and bilateral oophorectomy. Multiple newand enlarged peritoneal nodules throughout the low abdomen and pelvis, previously FDG avid and consistent with worsened locally recurrent and peritoneal metastatic disease. 2. Multiple small bilateral pulmonary nodules, several of which are slightly enlarged compared to prior examination, consistent with worsened pulmonary metastatic disease.   Aortic Atherosclerosis (ICD10-I70.0).     06/25/2022 - 07/06/2022 Chemotherapy   Patient is on Treatment Plan : UTERINE UNDIFFERENTIATED / LEIOMYOSARCOMA Gemcitabine D1,8 + Docetaxel D8 (900/100) q21d     06/25/2022 -  Chemotherapy   Patient is on Treatment Plan : UTERINE UNDIFFERENTIATED LEIOMYOSARCOMA Gemcitabine D1,8 + Docetaxel D8 (900/100) q21d     09/02/2022 Imaging   1. Moderate response to therapy of peritoneal metastasis within the anterior pelvis. 2. Minimal response to therapy of pulmonary metastasis. 3. No new or progressive disease. 4.  Aortic Atherosclerosis (ICD10-I70.0).       PHYSICAL EXAMINATION: ECOG PERFORMANCE STATUS: 2 - Symptomatic, <50% confined to bed  Vitals:   10/29/22 0824  BP: (!) 119/22  Pulse: (!) 122  Resp: 18  Temp: (!) 97.4 F (36.3 C)  SpO2: 100%   Filed Weights   10/29/22 0824  Weight: 215 lb 6.4 oz (97.7 kg)    GENERAL:alert, no distress and comfortable HEART: regular rate & rhythm and no murmurs with moderate bilateral lower extremity edema NEURO: alert & oriented  x 3 with fluent speech, no focal motor/sensory deficits  LABORATORY DATA:  I have reviewed the data as listed    Component Value Date/Time   NA 137 10/29/2022 0745   K 3.9 10/29/2022 0745   CL 102 10/29/2022 0745   CO2 27 10/29/2022 0745   GLUCOSE 110 (H) 10/29/2022 0745   BUN 15 10/29/2022 0745   CREATININE 0.94 10/29/2022 0745   CALCIUM 8.7 (L) 10/29/2022 0745   PROT 5.1 (L) 10/29/2022 0745   ALBUMIN 2.9 (L) 10/29/2022 0745   AST 23 10/29/2022 0745   ALT 12 10/29/2022 0745   ALKPHOS 124 10/29/2022 0745   BILITOT 0.4 10/29/2022 0745   GFRNONAA >60 10/29/2022 0745    No results found for: "SPEP", "UPEP"  Lab Results  Component Value Date   WBC 30.3 (H) 10/29/2022   NEUTROABS 21.3 (H) 10/29/2022   HGB 9.9 (L) 10/29/2022   HCT 29.1 (L) 10/29/2022   MCV 103.2 (H) 10/29/2022   PLT 142 (L) 10/29/2022  Chemistry      Component Value Date/Time   NA 137 10/29/2022 0745   K 3.9 10/29/2022 0745   CL 102 10/29/2022 0745   CO2 27 10/29/2022 0745   BUN 15 10/29/2022 0745   CREATININE 0.94 10/29/2022 0745      Component Value Date/Time   CALCIUM 8.7 (L) 10/29/2022 0745   ALKPHOS 124 10/29/2022 0745   AST 23 10/29/2022 0745   ALT 12 10/29/2022 0745   BILITOT 0.4 10/29/2022 0745

## 2022-10-29 NOTE — Assessment & Plan Note (Signed)
This is multifactorial, likely due to her low protein status causing third spacing and poor mobility during treatment I recommend leg elevation and dietary modification I am hesitant to recommend further diuretic therapy due to risk of dehydration

## 2022-10-29 NOTE — Assessment & Plan Note (Signed)
Her dietary oral protein intake is low She has declining values of total protein and albumin We discussed importance of dietary changes while on treatment

## 2022-10-29 NOTE — Assessment & Plan Note (Signed)
Her blood pressure has been running low and she is tachycardic I suspect she is clinically dehydrated Her oral intake is not great I recommend checking her blood pressure carefully and reduce her diuretic to half a tablet over the next few weeks

## 2022-10-29 NOTE — Assessment & Plan Note (Signed)
Her imaging anemia has improved with recent changes to her treatment plan She does not need blood transfusion today  

## 2022-10-30 ENCOUNTER — Other Ambulatory Visit: Payer: Self-pay

## 2022-11-01 ENCOUNTER — Other Ambulatory Visit: Payer: Self-pay

## 2022-11-01 IMAGING — CT CT ABD-PELV W/ CM
2 of 5 series · 16 of 46 positions shown, 18 images · IV contrast (APPLIED)
Comparison: CT chest 09/19/2021; MR abdomen 07/04/2021; MR pelvis
06/02/2021

CLINICAL DATA: Uterine/cervical cancer, assess treatment response.

EXAM:
CT ABDOMEN AND PELVIS WITH CONTRAST
TECHNIQUE: Multidetector CT imaging of the abdomen and pelvis was performed
using the standard protocol following bolus administration of
intravenous contrast.
CONTRAST:  100mL OMNIPAQUE IOHEXOL 350 MG/ML SOLN

[Series 2: axial st · axial · 0.89mm/px · z∈[-505,-75]mm · 13 of 100 slices shown, 15 images]
[im 7/100  soft-tissue]
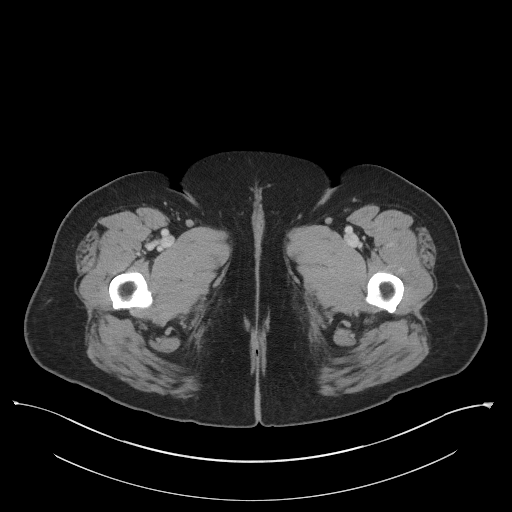
[im 7/100  bone]
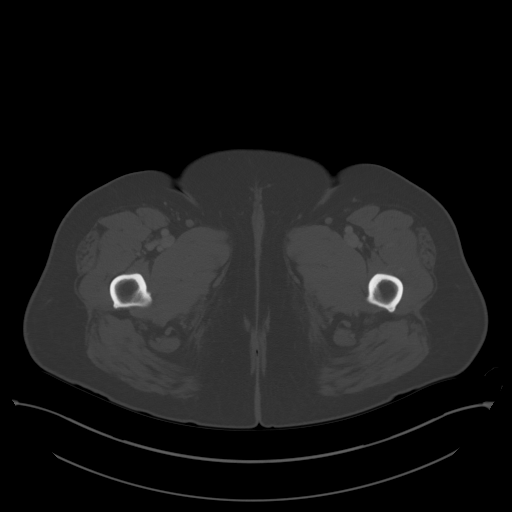
[im 14/100  soft-tissue]
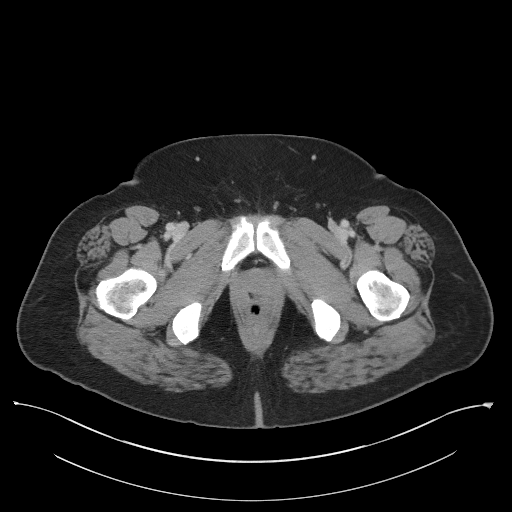
[im 20/100  soft-tissue]
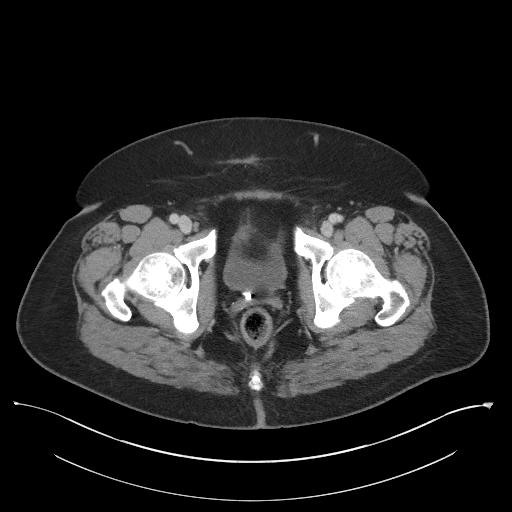
[im 27/100  soft-tissue]
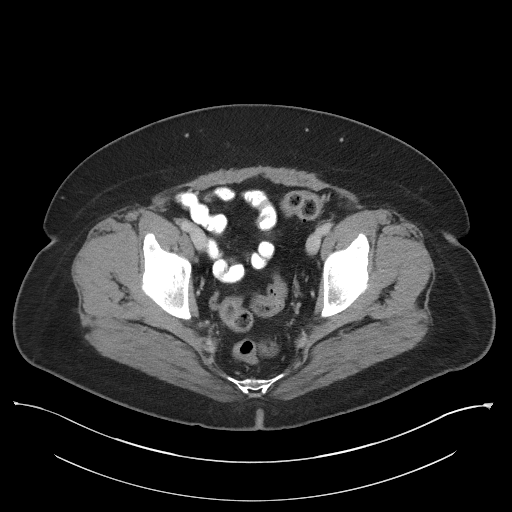
[im 34/100  soft-tissue]
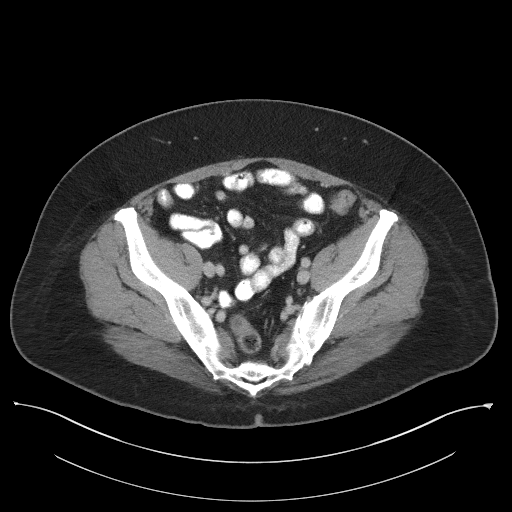
[im 40/100  soft-tissue]
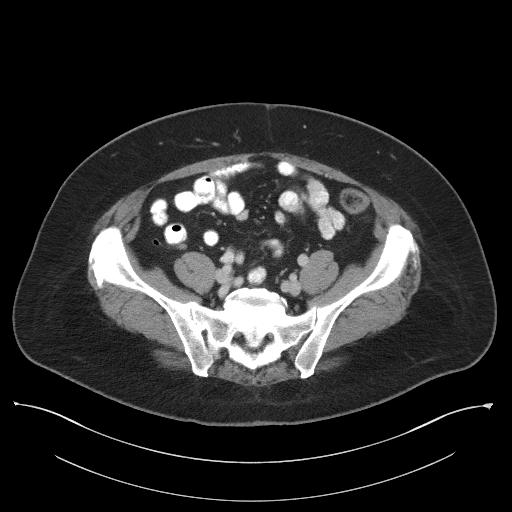
[im 53/100  soft-tissue]
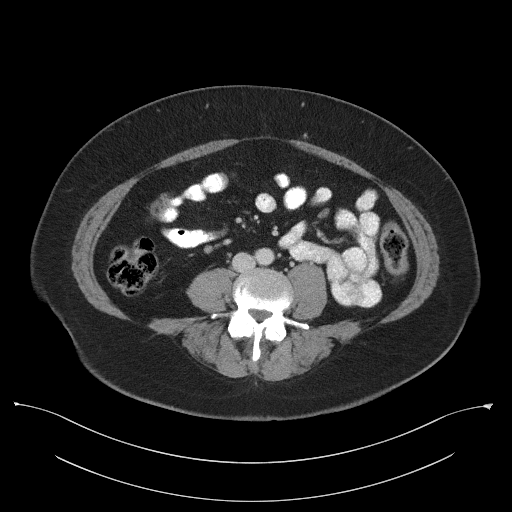
[im 60/100  soft-tissue]
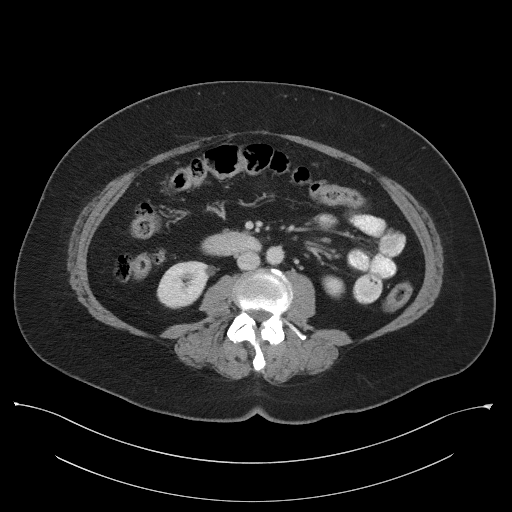
[im 67/100  soft-tissue]
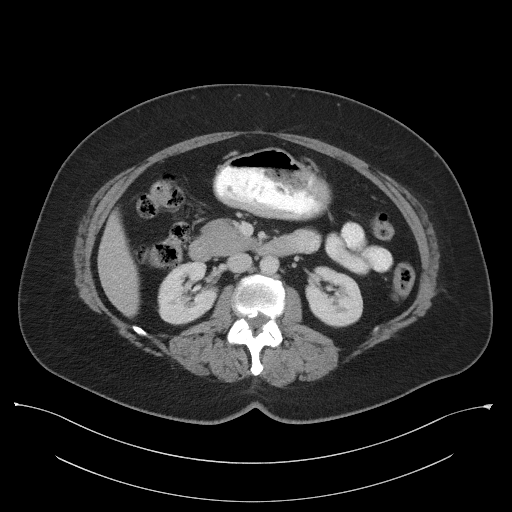
[im 67/100  bone]
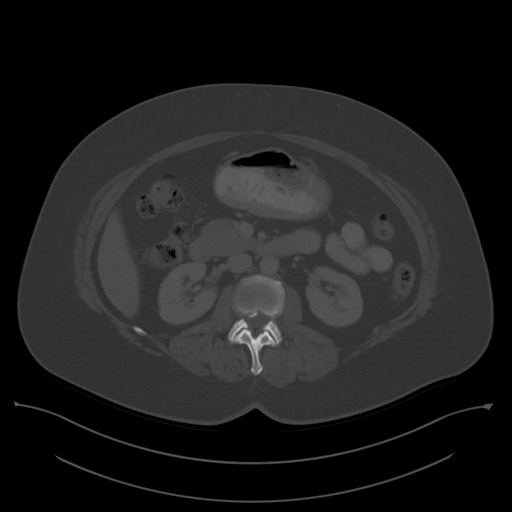
[im 73/100  soft-tissue]
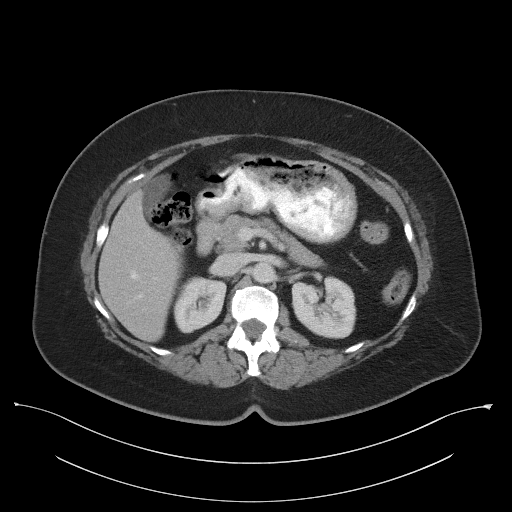
[im 80/100  soft-tissue]
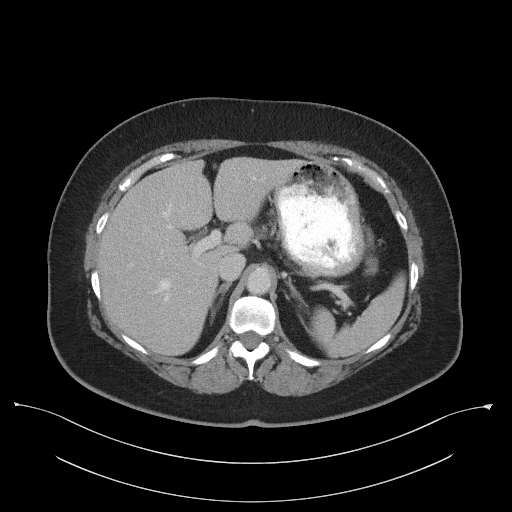
[im 86/100  soft-tissue]
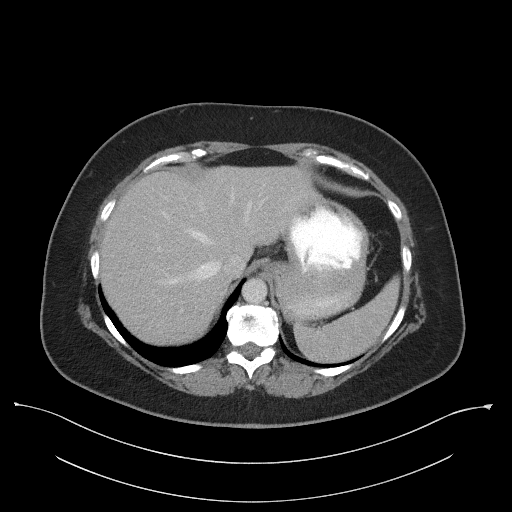
[im 93/100  soft-tissue]
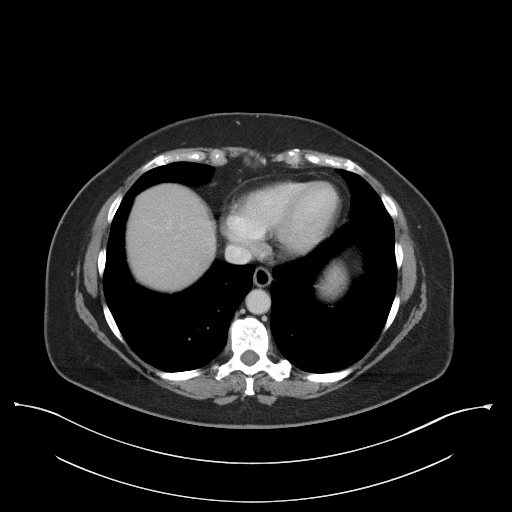

[Series 5: coronal st · coronal · 0.75mm/px · 3 of 101 slices shown]
[im 34/101  soft-tissue]
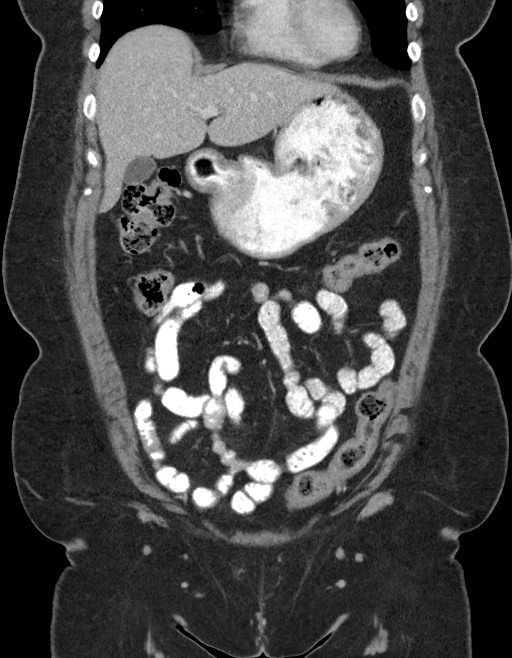
[im 45/101  soft-tissue]
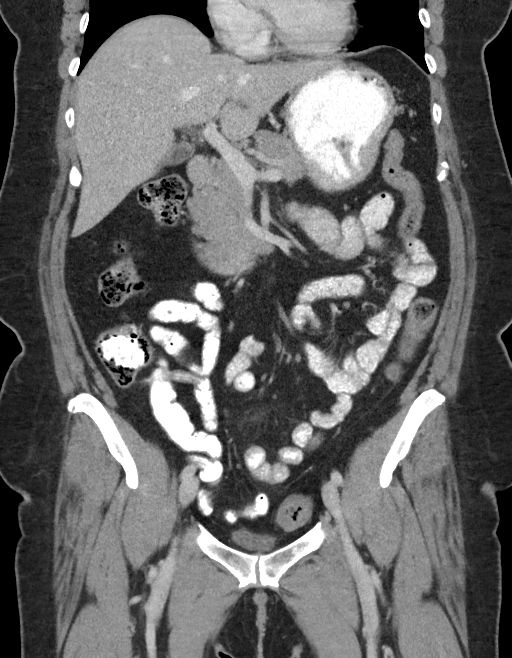
[im 56/101  soft-tissue]
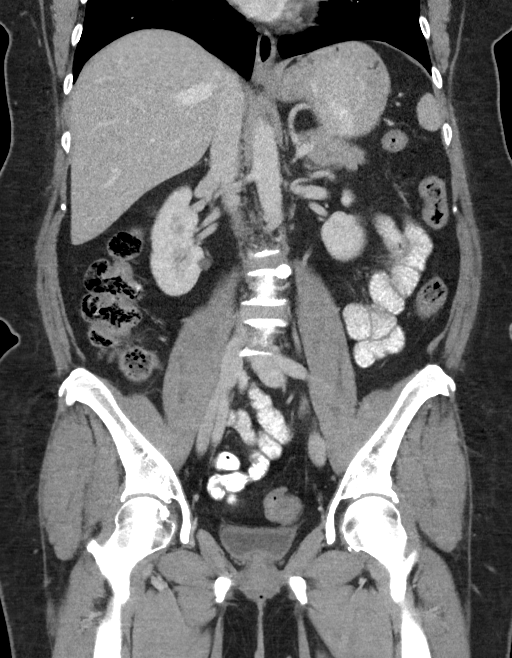

[16 of 46 positions shown; findings below may reference images not displayed]

FINDINGS: Lower chest: Unchanged peripheral nodule within the left lower lobe
measuring 4 mm, image [DATE]. Adjacent subpleural nodule measures 4 mm
and is also unchanged, image [DATE].

Hepatobiliary: Unchanged cyst within dome of right hepatic lobe
measuring 1 cm, image [DATE]. No suspicious liver lesions. Gallbladder
normal. No bile duct dilatation.

Pancreas: Unremarkable. No pancreatic ductal dilatation or
surrounding inflammatory changes.

Spleen: Normal in size without focal abnormality.

Adrenals/Urinary Tract: Normal adrenal glands. Unchanged appearance
of small cysts arising off the inferior pole of the right kidney
which measure up to 8 mm, image 41/2. No hydronephrosis or mass
noted. Bladder is unremarkable.

Stomach/Bowel: Stomach appears normal. The appendix is visualized
and is within normal limits. No bowel wall thickening, inflammation,
or distension.

Vascular/Lymphatic: Normal appearance of the abdominal aorta. No
abdominopelvic adenopathy.

Reproductive: Status post hysterectomy.  No adnexal mass.

Other: No ascites or focal fluid collections.

Musculoskeletal: No acute or significant osseous findings. Lumbar
degenerative disc disease.
IMPRESSION: 1. No acute findings within the abdomen or pelvis. No specific
findings identified to suggest residual or recurrence of tumor.
2. Stable small pulmonary nodules within the left lower lobe.

## 2022-11-19 ENCOUNTER — Telehealth: Payer: Self-pay

## 2022-11-19 MED FILL — Dexamethasone Sodium Phosphate Inj 100 MG/10ML: INTRAMUSCULAR | Qty: 1 | Status: AC

## 2022-11-19 NOTE — Telephone Encounter (Signed)
Returned her call. She has a head cold with a cough. Denies fever and covid test negative. She is scheduled for appts tomorrow with treatment. She will keep appts and wants to Dr. [name] to listen to her chest at office visit. Told her I would forward message to Dr. [name]. She appreciated the call back.

## 2022-11-20 ENCOUNTER — Inpatient Hospital Stay (HOSPITAL_BASED_OUTPATIENT_CLINIC_OR_DEPARTMENT_OTHER): Payer: Managed Care, Other (non HMO) | Admitting: Hematology and Oncology

## 2022-11-20 ENCOUNTER — Other Ambulatory Visit (HOSPITAL_COMMUNITY): Payer: Self-pay

## 2022-11-20 ENCOUNTER — Inpatient Hospital Stay: Payer: Managed Care, Other (non HMO)

## 2022-11-20 ENCOUNTER — Inpatient Hospital Stay: Payer: Managed Care, Other (non HMO) | Attending: Gynecologic Oncology

## 2022-11-20 ENCOUNTER — Encounter: Payer: Self-pay | Admitting: Hematology and Oncology

## 2022-11-20 ENCOUNTER — Other Ambulatory Visit: Payer: Self-pay

## 2022-11-20 VITALS — BP 156/87 | HR 108 | Resp 18

## 2022-11-20 VITALS — BP 153/80 | HR 122 | Temp 97.9°F | Resp 18 | Ht 67.5 in | Wt 212.8 lb

## 2022-11-20 DIAGNOSIS — R0981 Nasal congestion: Secondary | ICD-10-CM

## 2022-11-20 DIAGNOSIS — R600 Localized edema: Secondary | ICD-10-CM

## 2022-11-20 DIAGNOSIS — Z95828 Presence of other vascular implants and grafts: Secondary | ICD-10-CM

## 2022-11-20 DIAGNOSIS — C549 Malignant neoplasm of corpus uteri, unspecified: Secondary | ICD-10-CM

## 2022-11-20 DIAGNOSIS — Z5111 Encounter for antineoplastic chemotherapy: Secondary | ICD-10-CM | POA: Insufficient documentation

## 2022-11-20 DIAGNOSIS — D6481 Anemia due to antineoplastic chemotherapy: Secondary | ICD-10-CM

## 2022-11-20 DIAGNOSIS — T451X5A Adverse effect of antineoplastic and immunosuppressive drugs, initial encounter: Secondary | ICD-10-CM

## 2022-11-20 DIAGNOSIS — C55 Malignant neoplasm of uterus, part unspecified: Secondary | ICD-10-CM | POA: Insufficient documentation

## 2022-11-20 DIAGNOSIS — Z5189 Encounter for other specified aftercare: Secondary | ICD-10-CM | POA: Diagnosis not present

## 2022-11-20 LAB — COMPREHENSIVE METABOLIC PANEL
ALT: 11 U/L (ref 0–44)
AST: 21 U/L (ref 15–41)
Albumin: 3.3 g/dL — ABNORMAL LOW (ref 3.5–5.0)
Alkaline Phosphatase: 75 U/L (ref 38–126)
Anion gap: 7 (ref 5–15)
BUN: 14 mg/dL (ref 8–23)
CO2: 27 mmol/L (ref 22–32)
Calcium: 9.1 mg/dL (ref 8.9–10.3)
Chloride: 103 mmol/L (ref 98–111)
Creatinine, Ser: 0.56 mg/dL (ref 0.44–1.00)
GFR, Estimated: >60 mL/min (ref >60)
Glucose, Bld: 92 mg/dL (ref 70–99)
Potassium: 4.3 mmol/L (ref 3.5–5.1)
Sodium: 137 mmol/L (ref 135–145)
Total Bilirubin: 0.4 mg/dL (ref 0.3–1.2)
Total Protein: 5.6 g/dL — ABNORMAL LOW (ref 6.5–8.1)

## 2022-11-20 LAB — CBC WITH DIFFERENTIAL/PLATELET
Abs Immature Granulocytes: 0.02 10*3/uL (ref 0.00–0.07)
Basophils Absolute: 0 10*3/uL (ref 0.0–0.1)
Basophils Relative: 0 %
Eosinophils Absolute: 0 10*3/uL (ref 0.0–0.5)
Eosinophils Relative: 0 %
HCT: 30.4 % — ABNORMAL LOW (ref 36.0–46.0)
Hemoglobin: 10.2 g/dL — ABNORMAL LOW (ref 12.0–15.0)
Immature Granulocytes: 0 %
Lymphocytes Relative: 29 %
Lymphs Abs: 1.7 10*3/uL (ref 0.7–4.0)
MCH: 33 pg (ref 26.0–34.0)
MCHC: 33.6 g/dL (ref 30.0–36.0)
MCV: 98.4 fL (ref 80.0–100.0)
Monocytes Absolute: 1.2 10*3/uL — ABNORMAL HIGH (ref 0.1–1.0)
Monocytes Relative: 21 %
Neutro Abs: 2.8 10*3/uL (ref 1.7–7.7)
Neutrophils Relative %: 50 %
Platelets: 521 10*3/uL — ABNORMAL HIGH (ref 150–400)
RBC: 3.09 MIL/uL — ABNORMAL LOW (ref 3.87–5.11)
RDW: 19.1 % — ABNORMAL HIGH (ref 11.5–15.5)
WBC: 5.8 10*3/uL (ref 4.0–10.5)
nRBC: 0 % (ref 0.0–0.2)

## 2022-11-20 MED ORDER — SODIUM CHLORIDE 0.9 % IV SOLN
600.0000 mg/m2 | Freq: Once | INTRAVENOUS | Status: AC
  Administered 2022-11-20: 1293 mg via INTRAVENOUS
  Filled 2022-11-20: qty 34.01

## 2022-11-20 MED ORDER — PREDNISONE 20 MG PO TABS
20.0000 mg | ORAL_TABLET | Freq: Every day | ORAL | 0 refills | Status: DC
  Filled 2022-11-20: qty 7, 7d supply, fill #0

## 2022-11-20 MED ORDER — SODIUM CHLORIDE 0.9 % IV SOLN
10.0000 mg | Freq: Once | INTRAVENOUS | Status: AC
  Administered 2022-11-20: 10 mg via INTRAVENOUS
  Filled 2022-11-20: qty 10

## 2022-11-20 MED ORDER — SODIUM CHLORIDE 0.9% FLUSH
10.0000 mL | INTRAVENOUS | Status: DC | PRN
  Administered 2022-11-20: 10 mL via INTRAVENOUS

## 2022-11-20 MED ORDER — SODIUM CHLORIDE 0.9 % IV SOLN: Freq: Once | INTRAVENOUS | Status: AC

## 2022-11-20 MED ORDER — NYSTATIN 100000 UNIT/ML MT SUSP
5.0000 mL | Freq: Four times a day (QID) | OROMUCOSAL | 0 refills | Status: DC
  Filled 2022-11-20: qty 150, 8d supply, fill #0

## 2022-11-20 MED ORDER — SODIUM CHLORIDE 0.9% FLUSH
10.0000 mL | INTRAVENOUS | Status: DC | PRN
  Administered 2022-11-20: 10 mL

## 2022-11-20 MED ORDER — SODIUM CHLORIDE 0.9 % IV SOLN
75.0000 mg/m2 | Freq: Once | INTRAVENOUS | Status: AC
  Administered 2022-11-20: 160 mg via INTRAVENOUS
  Filled 2022-11-20: qty 16

## 2022-11-20 MED ORDER — HEPARIN SOD (PORK) LOCK FLUSH 100 UNIT/ML IV SOLN
500.0000 [IU] | Freq: Once | INTRAVENOUS | Status: AC | PRN
  Administered 2022-11-20: 500 [IU]

## 2022-11-20 MED ORDER — PROCHLORPERAZINE MALEATE 10 MG PO TABS
10.0000 mg | ORAL_TABLET | Freq: Once | ORAL | Status: AC
  Administered 2022-11-20: 10 mg via ORAL
  Filled 2022-11-20: qty 1

## 2022-11-20 MED ORDER — FUROSEMIDE 20 MG PO TABS
20.0000 mg | ORAL_TABLET | Freq: Every day | ORAL | 0 refills | Status: DC
  Filled 2022-11-20: qty 30, 30d supply, fill #0

## 2022-11-20 NOTE — Assessment & Plan Note (Signed)
She has recurrent nasal congestion She felt better while on steroid I recommend a course of prednisone after completion of dexamethasone this weekend for 7 days

## 2022-11-20 NOTE — Assessment & Plan Note (Signed)
We will proceed with treatment as scheduled We have modified her treatment to be given every other week with plan for CT imaging done on January 16 for objective assessment of response to therapy We will proceed with treatment with additional supportive care today

## 2022-11-20 NOTE — Progress Notes (Signed)
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[facility]) We will proceed with treatment as scheduled We have modified her treatment to be given every other week with plan for CT imaging done on January 16 for objective assessment of response to therapy We will proceed with treatment with additional supportive care today  Anemia due to antineoplastic chemotherapy Her imaging anemia has improved with recent changes to her treatment plan She does not need blood transfusion today   Bilateral leg edema She is prone to retain fluid I recommend furosemide as needed especially while she is on steroids  Nasal congestion She has recurrent nasal congestion She felt better while on steroid I recommend a course of prednisone after completion of dexamethasone this weekend for 7 days  No orders of the defined types were placed in this encounter.   All questions were answered. The patient knows to call the clinic with any problems, questions or concerns. The total time spent in the appointment was 30 minutes encounter with patients including review of chart and various tests results, discussions about plan of care and coordination of care plan   [name], MD 11/20/2022 8:57 AM  INTERVAL HISTORY: Please see below for problem oriented charting. she returns for treatment follow-up seen prior to chemotherapy She developed some cold-like symptoms with nasal drainage and mild nonproductive cough Denies peripheral neuropathy She also had bilateral lower extremity edema, stable  REVIEW OF SYSTEMS:   Constitutional: Denies fevers, chills or abnormal weight loss Eyes: Denies blurriness of vision Ears, nose, mouth, throat, and face: Denies mucositis or sore  throat Gastrointestinal:  Denies nausea, heartburn or change in bowel habits Skin: Denies abnormal skin rashes Lymphatics: Denies new lymphadenopathy or easy bruising Neurological:Denies numbness, tingling or new weaknesses Behavioral/Psych: Mood is stable, no new changes  All other systems were reviewed with the patient and are negative.  I have reviewed the past medical history, past surgical history, social history and family history with the patient and they are unchanged from previous note.  ALLERGIES:  has No Known Allergies.  MEDICATIONS:  Current Outpatient Medications  Medication Sig Dispense Refill   furosemide (LASIX) 20 MG tablet Take 1 tablet (20 mg total) by mouth daily. 30 tablet 0   predniSONE (DELTASONE) 20 MG tablet Take 1 tablet (20 mg total) by mouth daily with breakfast. 7 tablet 0   acetaminophen (TYLENOL) 500 MG tablet Take 500 mg by mouth every 6 (six) hours as needed for mild pain, moderate pain or fever.     aspirin 81 MG EC tablet Take by mouth daily.     cetirizine (ZYRTEC) 10 MG tablet Take 10 mg by mouth daily.     Cholecalciferol (VITAMIN D3 PO) Take 1 tablet by mouth daily.     dexamethasone (DECADRON) 4 MG tablet Take 2 pills the day before chemo and daily for 2 days after chemo 30 tablet 0   lidocaine-prilocaine (EMLA) cream Apply 1 Application topically as needed. 30 g 3   LORazepam (ATIVAN) 0.5 MG tablet Take 1 tablet (0.5 mg total) by mouth 2 (two) times daily as needed for anxiety. 3 tablet 0   losartan-hydrochlorothiazide (HYZAAR) 100-25 MG tablet Take 0.5 tablets by mouth daily.     magic mouthwash (nystatin, lidocaine, diphenhydrAMINE, alum & mag hydroxide) suspension Swish  and swallow 5 mLs by mouth 4 (four) times daily for 7 days 150 mL 0   omeprazole (PRILOSEC) 20 MG capsule Take 1 capsule (20 mg total) by mouth 2 (two) times daily. 28 capsule 0   ondansetron (ZOFRAN) 8 MG tablet Take 1 tablet (8 mg total) by mouth every 8 (eight) hours as needed  for nausea. 30 tablet 3   No current facility-administered medications for this visit.    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 No extra uterine extension. No evidence of metastatic disease within the pelvis.   Mild thickening of the endometrial stripe, likely related to entrapment by the adjacent uterine mass   06/03/2021 Initial Diagnosis   Uterine sarcoma ([place])   06/03/2021 Cancer Staging   Staging form: Corpus Uteri - Sarcoma, AJCC 7th Edition - Clinical stage from 06/03/2021: FIGO Stage IVB (rT1b, N0, M1) - Signed by [name], MD on 03/31/2022 Diagnostic confirmation: Positive histology Stage prefix: Recurrence Biopsy of metastatic site performed: No Lymph-vascular invasion (LVI): LVI present/identified, NOS   06/03/2021 Pathology Results   FINAL MICROSCOPIC DIAGNOSIS:   A. UTERUS, CERVIX, BILATERAL FALLOPIAN TUBES AND OVARIES, HYSTERECTOMY:  - Uterus:       Mixed high grade uterine sarcoma, spanning 6 cm, see comment.       Extensive lymphovascular invasion.       See oncology table.  - Cervix: Benign squamous and endocervical mucosa. No dysplasia or  malignancy.  - Bilateral ovaries: Unremarkable. No malignancy.  - Bilateral fallopian tubes: Unremarkable. No malignancy.   ONCOLOGY TABLE:   UTERUS, SARCOMA: Resection   Procedure: Total hysterectomy and bilateral salpingo-oophorectomy  Specimen Integrity: Focally disrupted on posterior surface  Tumor Site: Anterior wall  Tumor Size: 6 cm  Histologic Type: High grade sarcoma, mixed. See comment.  Other Tissue/ Organ Involvement: Not  identified  Lymphovascular Invasion: Present, extensive.  Margins: All margins negative for tumor  Regional Lymph Nodes: Not applicable (no lymph nodes submitted or found)  Distant Metastasis:       Distant Site(s) Involved: Not applicable  Pathologic Stage Classification (pTNM, AJCC 8th Edition): pT1b, pN not  assigned  Ancillary Studies: Can be performed upon request  Representative Tumor Block: A6  Comment(s): The tumor has two morphologically different components. One component consists of malignant spindle cells which can be seen arising from the smooth muscle. These foci are positive for SMA, desmin, cyclinD1 (focal), and CD10 (patchy). The other component consists of round to slightly spindle cells with abundant admixed vessels and occasional large pleomorphic cells. These areas are positive for SMA (patchy weak), desmin (focal), CD10 (variable with diffuse areas). Pancytokeratin is negative. 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   ADDENDUM:   PROGNOSTIC INDICATOR RESULTS:   Immunohistochemical and morphometric analysis performed manually    Estrogen Receptor:       POSITIVE, 50%, WEAK TO MODERATE STAINING  Progesterone Receptor:   POSITIVE, 50%, MODERATE TO STRONG STAINING   Reference Range Estrogen and Progesterone Receptor       Negative  0%       Positive  >1%    06/03/2021 Surgery   Surgeon:  Donaciano Eva    Assistants: Dr Lahoma Crocker (an MD assistant was necessary for tissue manipulation, management of robotic instrumentation, retraction and positioning due to the complexity of the case and hospital policies).  Operation: Robotic-assisted laparoscopic total hysterectomy >250gm with bilateral salpingoophorectomy, minilaparotomy for specimen delivery   Surgeon: Donaciano Eva    Operative Findings:  : Bulky 16cm uterus with intra-uterine mass (consistent with a fibroid-like  mass) , smooth normal appearing serosa, no suspicious bulky nodes. Normal ovaries bilaterally. Normal upper abdomen. Uterus too large to deliver vaginally in tact.    06/18/2021 Imaging   1. Multiple bilateral pulmonary nodules, measuring up to 9 mm. Most of these are perifissural and subpleural in location, likely lymph nodes. Nevertheless, close follow-up recommended to exclude metastatic disease. 2. 11 mm hypoattenuating lesion towards the dome of the liver, indeterminate. MRI abdomen with and without contrast could be used to further evaluate as clinically warranted. 3. Aortic Atherosclerosis (ICD10-I70.0).   10/13/2021 Imaging   1. No acute findings within the abdomen or pelvis. No specific findings identified to suggest residual or recurrence of tumor. 2. Stable small pulmonary nodules within the left lower lobe.   03/18/2022 Imaging   1. Interval development of 2 soft tissue nodules in the anterior pelvis measuring 10 mm and 7 mm respectively. Close follow-up recommended to exclude metastatic disease. PET-CT may be warranted to further evaluate. 2. Development of 2 mm left upper lobe parenchymal nodule, nonspecific. Close attention on follow-up imaging recommended  3. Stable appearance of index bilateral pulmonary nodules identified previously. 4. Aortic Atherosclerosis (ICD10-I70.0).   04/02/2022 PET scan   1. Signs of local tumor recurrence noted at the vaginal cuff. 2. 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 New since 10/13/2021 and progressive when compared with 03/17/2022. 3. New focal area of increased uptake within the right biceps femoris muscle which is suspicious for skeletal muscle involvement. 4. Stable appearance of small pulmonary nodules described on 03/17/2022. These are too small to characterize by PET-CT.   04/09/2022 Procedure   Successful placement of a right IJ approach Power Port with ultrasound and fluoroscopic guidance. The  catheter is ready for use.   04/09/2022 Echocardiogram    1. Left ventricular ejection fraction, by estimation, is 65 to 70%. The left ventricle has normal function. The left ventricle has no regional wall motion abnormalities. There is mild concentric left ventricular hypertrophy. Left ventricular diastolic parameters are consistent with Grade I diastolic dysfunction (impaired relaxation).  2. Right ventricular systolic function is normal. The right ventricular size is normal.  3. The mitral valve is normal in structure. No evidence of mitral valve regurgitation.  4. The aortic valve is tricuspid. Aortic valve regurgitation is not visualized. No aortic stenosis is present.  5. The inferior vena cava is normal in size with greater than 50% respiratory variability, suggesting right atrial pressure of 3 mmHg.   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 Status post hysterectomy and bilateral oophorectomy. Multiple newand enlarged peritoneal nodules throughout the low abdomen and pelvis, previously FDG avid and consistent with worsened locally recurrent and peritoneal metastatic disease. 2. Multiple small bilateral pulmonary nodules, several of which are slightly enlarged compared to prior examination, consistent with worsened pulmonary metastatic disease.   Aortic Atherosclerosis (ICD10-I70.0).     06/25/2022 - 07/06/2022 Chemotherapy   Patient is on Treatment Plan :  UTERINE UNDIFFERENTIATED / LEIOMYOSARCOMA Gemcitabine D1,8 + Docetaxel D8 (900/100) q21d     06/25/2022 -  Chemotherapy   Patient is on Treatment Plan : UTERINE UNDIFFERENTIATED LEIOMYOSARCOMA Gemcitabine D1,8 + Docetaxel D8 (900/100) q21d     09/02/2022 Imaging   1. Moderate response to therapy of peritoneal metastasis within the anterior pelvis. 2. Minimal response to therapy of pulmonary metastasis. 3. No new or progressive  disease. 4.  Aortic Atherosclerosis (ICD10-I70.0).       PHYSICAL EXAMINATION: ECOG PERFORMANCE STATUS: 1 - Symptomatic but completely ambulatory  Vitals:   11/20/22 0808  BP: (!) 153/80  Pulse: (!) 122  Resp: 18  Temp: 97.9 F (36.6 C)  SpO2: 100%   Filed Weights   11/20/22 0808  Weight: 212 lb 12.8 oz (96.5 kg)    GENERAL:alert, no distress and comfortable SKIN: skin color, texture, turgor are normal, no rashes or significant lesions EYES: normal, Conjunctiva are pink and non-injected, sclera clear OROPHARYNX:no exudate, no erythema and lips, buccal mucosa, and tongue normal  NECK: supple, thyroid normal size, non-tender, without nodularity LYMPH:  no palpable lymphadenopathy in the cervical, axillary or inguinal LUNGS: She has scattered expiratory wheezes HEART: regular rate & rhythm and no murmurs with mild bilateral lower extremity edema ABDOMEN:abdomen soft, non-tender and normal bowel sounds Musculoskeletal:no cyanosis of digits and no clubbing  NEURO: alert & oriented x 3 with fluent speech, no focal motor/sensory deficits  LABORATORY DATA:  I have reviewed the data as listed    Component Value Date/Time   NA 137 11/20/2022 0742   K 4.3 11/20/2022 0742   CL 103 11/20/2022 0742   CO2 27 11/20/2022 0742   GLUCOSE 92 11/20/2022 0742   BUN 14 11/20/2022 0742   CREATININE 0.56 11/20/2022 0742   CREATININE 0.94 10/29/2022 0745   CALCIUM 9.1 11/20/2022 0742   PROT 5.6 (L) 11/20/2022 0742   ALBUMIN 3.3 (L) 11/20/2022 0742   AST 21 11/20/2022 0742   AST 23 10/29/2022 0745   ALT 11 11/20/2022 0742   ALT 12 10/29/2022 0745   ALKPHOS 75 11/20/2022 0742   BILITOT 0.4 11/20/2022 0742   BILITOT 0.4 10/29/2022 0745   GFRNONAA >60 11/20/2022 0742   GFRNONAA >60 10/29/2022 0745    No results found for: "SPEP", "UPEP"  Lab Results  Component Value Date   WBC 5.8 11/20/2022   NEUTROABS 2.8 11/20/2022   HGB 10.2 (L) 11/20/2022   HCT 30.4 (L) 11/20/2022   MCV  98.4 11/20/2022   PLT 521 (H) 11/20/2022      Chemistry      Component Value Date/Time   NA 137 11/20/2022 0742   K 4.3 11/20/2022 0742   CL 103 11/20/2022 0742   CO2 27 11/20/2022 0742   BUN 14 11/20/2022 0742   CREATININE 0.56 11/20/2022 0742   CREATININE 0.94 10/29/2022 0745      Component Value Date/Time   CALCIUM 9.1 11/20/2022 0742   ALKPHOS 75 11/20/2022 0742   AST 21 11/20/2022 0742   AST 23 10/29/2022 0745   ALT 11 11/20/2022 0742   ALT 12 10/29/2022 0745   BILITOT 0.4 11/20/2022 0742   BILITOT 0.4 10/29/2022 0745

## 2022-11-20 NOTE — Assessment & Plan Note (Signed)
Her imaging anemia has improved with recent changes to her treatment plan She does not need blood transfusion today  

## 2022-11-20 NOTE — Assessment & Plan Note (Signed)
She is prone to retain fluid I recommend furosemide as needed especially while she is on steroids

## 2022-11-20 NOTE — Progress Notes (Signed)
Ok to treat with heart rate of 110 today per Dr. [name]

## 2022-11-23 ENCOUNTER — Inpatient Hospital Stay: Payer: Managed Care, Other (non HMO)

## 2022-11-23 VITALS — BP 119/65 | HR 118 | Temp 98.5°F | Resp 18

## 2022-11-23 DIAGNOSIS — Z5111 Encounter for antineoplastic chemotherapy: Secondary | ICD-10-CM | POA: Diagnosis not present

## 2022-11-23 DIAGNOSIS — C549 Malignant neoplasm of corpus uteri, unspecified: Secondary | ICD-10-CM

## 2022-11-23 MED ORDER — PEGFILGRASTIM INJECTION 6 MG/0.6ML ~~LOC~~
6.0000 mg | PREFILLED_SYRINGE | Freq: Once | SUBCUTANEOUS | Status: AC
  Administered 2022-11-23: 6 mg via SUBCUTANEOUS
  Filled 2022-11-23: qty 0.6

## 2022-12-01 ENCOUNTER — Inpatient Hospital Stay: Payer: Managed Care, Other (non HMO)

## 2022-12-01 ENCOUNTER — Ambulatory Visit (HOSPITAL_COMMUNITY)
Admission: RE | Admit: 2022-12-01 | Discharge: 2022-12-01 | Disposition: A | Discharge status: Home / Self Care | Payer: Managed Care, Other (non HMO) | Source: Ambulatory Visit | Attending: Hematology and Oncology | Admitting: Hematology and Oncology

## 2022-12-01 DIAGNOSIS — C549 Malignant neoplasm of corpus uteri, unspecified: Secondary | ICD-10-CM | POA: Diagnosis present

## 2022-12-01 LAB — CMP (CANCER CENTER ONLY)
ALT: 16 U/L (ref 0–44)
AST: 23 U/L (ref 15–41)
Albumin: 3.4 g/dL — ABNORMAL LOW (ref 3.5–5.0)
Alkaline Phosphatase: 177 U/L — ABNORMAL HIGH (ref 38–126)
Anion gap: 9 (ref 5–15)
BUN: 14 mg/dL (ref 8–23)
CO2: 28 mmol/L (ref 22–32)
Calcium: 9.2 mg/dL (ref 8.9–10.3)
Chloride: 99 mmol/L (ref 98–111)
Creatinine: 0.65 mg/dL (ref 0.44–1.00)
GFR, Estimated: >60 mL/min (ref >60)
Glucose, Bld: 99 mg/dL (ref 70–99)
Potassium: 3.4 mmol/L — ABNORMAL LOW (ref 3.5–5.1)
Sodium: 136 mmol/L (ref 135–145)
Total Bilirubin: 0.4 mg/dL (ref 0.3–1.2)
Total Protein: 6.4 g/dL — ABNORMAL LOW (ref 6.5–8.1)

## 2022-12-01 LAB — CBC WITH DIFFERENTIAL (CANCER CENTER ONLY)
Abs Immature Granulocytes: 6.23 10*3/uL — ABNORMAL HIGH (ref 0.00–0.07)
Basophils Absolute: 0.1 10*3/uL (ref 0.0–0.1)
Basophils Relative: 0 %
Eosinophils Absolute: 0.1 10*3/uL (ref 0.0–0.5)
Eosinophils Relative: 0 %
HCT: 34.9 % — ABNORMAL LOW (ref 36.0–46.0)
Hemoglobin: 11.8 g/dL — ABNORMAL LOW (ref 12.0–15.0)
Immature Granulocytes: 13 %
Lymphocytes Relative: 8 %
Lymphs Abs: 3.6 10*3/uL (ref 0.7–4.0)
MCH: 32.5 pg (ref 26.0–34.0)
MCHC: 33.8 g/dL (ref 30.0–36.0)
MCV: 96.1 fL (ref 80.0–100.0)
Monocytes Absolute: 3.3 10*3/uL — ABNORMAL HIGH (ref 0.1–1.0)
Monocytes Relative: 7 %
Neutro Abs: 34.5 10*3/uL — ABNORMAL HIGH (ref 1.7–7.7)
Neutrophils Relative %: 72 %
Platelet Count: 172 10*3/uL (ref 150–400)
RBC: 3.63 MIL/uL — ABNORMAL LOW (ref 3.87–5.11)
RDW: 20.5 % — ABNORMAL HIGH (ref 11.5–15.5)
WBC Count: 47.9 10*3/uL — ABNORMAL HIGH (ref 4.0–10.5)
nRBC: 0.1 % (ref 0.0–0.2)

## 2022-12-01 MED ORDER — IOHEXOL 300 MG/ML  SOLN
100.0000 mL | Freq: Once | INTRAMUSCULAR | Status: AC | PRN
  Administered 2022-12-01: 100 mL via INTRAVENOUS

## 2022-12-01 MED ORDER — HEPARIN SOD (PORK) LOCK FLUSH 100 UNIT/ML IV SOLN
INTRAVENOUS | Status: AC
  Administered 2022-12-01: 500 [IU]
  Filled 2022-12-01: qty 5

## 2022-12-01 MED ORDER — SODIUM CHLORIDE 0.9% FLUSH
10.0000 mL | Freq: Once | INTRAVENOUS | Status: AC
  Administered 2022-12-01: 10 mL

## 2022-12-01 MED ORDER — SODIUM CHLORIDE (PF) 0.9 % IJ SOLN
INTRAMUSCULAR | Status: AC
  Filled 2022-12-01: qty 50

## 2022-12-01 MED ORDER — HEPARIN SOD (PORK) LOCK FLUSH 100 UNIT/ML IV SOLN: 500.0000 [IU] | Freq: Once | INTRAVENOUS | Status: DC

## 2022-12-03 ENCOUNTER — Encounter: Payer: Self-pay | Admitting: Hematology and Oncology

## 2022-12-03 ENCOUNTER — Inpatient Hospital Stay: Payer: Managed Care, Other (non HMO)

## 2022-12-03 ENCOUNTER — Inpatient Hospital Stay (HOSPITAL_BASED_OUTPATIENT_CLINIC_OR_DEPARTMENT_OTHER): Payer: Managed Care, Other (non HMO) | Admitting: Hematology and Oncology

## 2022-12-03 VITALS — BP 135/67 | HR 121 | Temp 98.1°F | Resp 18 | Ht 67.5 in | Wt 195.6 lb

## 2022-12-03 VITALS — BP 119/57 | HR 89 | Resp 18

## 2022-12-03 DIAGNOSIS — C549 Malignant neoplasm of corpus uteri, unspecified: Secondary | ICD-10-CM | POA: Diagnosis not present

## 2022-12-03 DIAGNOSIS — E440 Moderate protein-calorie malnutrition: Secondary | ICD-10-CM

## 2022-12-03 DIAGNOSIS — R600 Localized edema: Secondary | ICD-10-CM

## 2022-12-03 DIAGNOSIS — Z5111 Encounter for antineoplastic chemotherapy: Secondary | ICD-10-CM | POA: Diagnosis not present

## 2022-12-03 MED ORDER — SODIUM CHLORIDE 0.9 % IV SOLN: Freq: Once | INTRAVENOUS | Status: AC

## 2022-12-03 MED ORDER — HEPARIN SOD (PORK) LOCK FLUSH 100 UNIT/ML IV SOLN
500.0000 [IU] | Freq: Once | INTRAVENOUS | Status: AC | PRN
  Administered 2022-12-03: 500 [IU]

## 2022-12-03 MED ORDER — SODIUM CHLORIDE 0.9% FLUSH
10.0000 mL | INTRAVENOUS | Status: DC | PRN
  Administered 2022-12-03: 10 mL

## 2022-12-03 MED ORDER — SODIUM CHLORIDE 0.9 % IV SOLN
600.0000 mg/m2 | Freq: Once | INTRAVENOUS | Status: AC
  Administered 2022-12-03: 1255 mg via INTRAVENOUS
  Filled 2022-12-03: qty 33.01

## 2022-12-03 MED ORDER — PROCHLORPERAZINE MALEATE 10 MG PO TABS
10.0000 mg | ORAL_TABLET | Freq: Once | ORAL | Status: AC
  Administered 2022-12-03: 10 mg via ORAL
  Filled 2022-12-03: qty 1

## 2022-12-03 NOTE — Progress Notes (Signed)
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 have reviewed multiple imaging studies with the patient and her husband Overall, she has excellent response to therapy We discussed the risk and benefits of continuing treatment versus taking a break and for now she would like to continue treatment The patient is prone to develop significant fluid retention We discussed importance of dietary modification and only use furosemide if her weight is over 200 pounds I plan to reduce the dose of Taxotere little bit for future treatment  Bilateral leg edema This has improved We discussed importance of dietary modification and third spacing as a cause of fluid retention She will use furosemide only if her weight is over 200 pounds  Protein-calorie malnutrition, moderate ([place]) Her dietary oral protein intake is low She has declining values of total protein and albumin We discussed importance of dietary changes while on treatment  Orders Placed This Encounter  Procedures   CBC with Differential ([place] Only)    Standing Status:   Future    Standing Expiration Date:   01/08/2024   CMP ([place] only)    Standing Status:   Future    Standing Expiration Date:   01/08/2024   CBC with Differential ([place] Only)    Standing Status:   Future    Standing Expiration Date:   01/22/2024   CMP ([place] only)    Standing Status:   Future    Standing Expiration Date:   01/22/2024    All questions were answered. The patient knows to call the clinic with any problems, questions or concerns. The total time spent in the appointment was 30 minutes encounter with patients including review of chart and various tests results, discussions about plan of care and  coordination of care plan   [name], MD 12/03/2022 4:36 PM  INTERVAL HISTORY: Please see below for problem oriented charting. she returns for treatment follow-up with her husband We reviewed test results We discussed findings on CT imaging and discussed the use of furosemide as needed She has lost almost 20 pounds since last time I saw her Her leg swelling has improved She denies worsening neuropathy while on treatment  REVIEW OF SYSTEMS:   Constitutional: Denies fevers, chills  Eyes: Denies blurriness of vision Ears, nose, mouth, throat, and face: Denies mucositis or sore throat Respiratory: Denies cough, dyspnea or wheezes Cardiovascular: Denies palpitation, chest discomfort Gastrointestinal:  Denies nausea, heartburn or change in bowel habits Skin: Denies abnormal skin rashes Lymphatics: Denies new lymphadenopathy or easy bruising Neurological:Denies numbness, tingling or new weaknesses Behavioral/Psych: Mood is stable, no new changes  All other systems were reviewed with the patient and are negative.  I have reviewed the past medical history, past surgical history, social history and family history with the patient and they are unchanged from previous note.  ALLERGIES:  has No Known Allergies.  MEDICATIONS:  Current Outpatient Medications  Medication Sig Dispense Refill   acetaminophen (TYLENOL) 500 MG tablet Take 500 mg by mouth every 6 (six) hours as needed for mild pain, moderate pain or fever.     aspirin 81 MG EC tablet Take by mouth daily.     cetirizine (ZYRTEC) 10 MG tablet Take 10 mg by mouth daily.     Cholecalciferol (VITAMIN  D3 PO) Take 1 tablet by mouth daily.     dexamethasone (DECADRON) 4 MG tablet Take 2 pills the day before chemo and daily for 2 days after chemo 30 tablet 0   furosemide (LASIX) 20 MG tablet Take 1 tablet (20 mg total) by mouth daily. 30 tablet 0   lidocaine-prilocaine (EMLA) cream Apply 1 Application topically as needed. 30 g 3    LORazepam (ATIVAN) 0.5 MG tablet Take 1 tablet (0.5 mg total) by mouth 2 (two) times daily as needed for anxiety. 3 tablet 0   losartan-hydrochlorothiazide (HYZAAR) 100-25 MG tablet Take 0.5 tablets by mouth daily.     magic mouthwash (nystatin, lidocaine, diphenhydrAMINE, alum & mag hydroxide) suspension Swish and swallow 5 mLs by mouth 4 (four) times daily for 7 days 150 mL 0   omeprazole (PRILOSEC) 20 MG capsule Take 1 capsule (20 mg total) by mouth 2 (two) times daily. 28 capsule 0   ondansetron (ZOFRAN) 8 MG tablet Take 1 tablet (8 mg total) by mouth every 8 (eight) hours as needed for nausea. 30 tablet 3   predniSONE (DELTASONE) 20 MG tablet Take 1 tablet (20 mg total) by mouth daily with breakfast. 7 tablet 0   No current facility-administered medications for this visit.    SUMMARY OF ONCOLOGIC HISTORY: Oncology History Overview Note  High grade and LMS, 50% ER/PR positive dMMR normal, PD-L1 CPS 3%   Uterine sarcoma ([place])  06/02/2021 Imaging   MRI pelvis Heterogeneous 9.0 cm intrauterine mass within the intramural/submucosal anterior fundus demonstrating interval growth, internal enhancement, and restricted diffusion 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 No extra uterine extension. No evidence of metastatic disease within the pelvis.   Mild thickening of the endometrial stripe, likely related to entrapment by the adjacent uterine mass   06/03/2021 Initial Diagnosis   Uterine sarcoma ([place])   06/03/2021 Cancer Staging   Staging form: Corpus Uteri - Sarcoma, AJCC 7th Edition - Clinical stage from 06/03/2021: FIGO Stage IVB (rT1b, N0, M1) - Signed by [name], MD on 03/31/2022 Diagnostic confirmation: Positive histology Stage prefix: Recurrence Biopsy of metastatic site performed: No Lymph-vascular invasion (LVI): LVI present/identified, NOS   06/03/2021 Pathology Results   FINAL MICROSCOPIC DIAGNOSIS:   A. UTERUS, CERVIX, BILATERAL  FALLOPIAN TUBES AND OVARIES, HYSTERECTOMY:  - Uterus:       Mixed high grade uterine sarcoma, spanning 6 cm, see comment.       Extensive lymphovascular invasion.       See oncology table.  - Cervix: Benign squamous and endocervical mucosa. No dysplasia or  malignancy.  - Bilateral ovaries: Unremarkable. No malignancy.  - Bilateral fallopian tubes: Unremarkable. No malignancy.   ONCOLOGY TABLE:   UTERUS, SARCOMA: Resection   Procedure: Total hysterectomy and bilateral salpingo-oophorectomy  Specimen Integrity: Focally disrupted on posterior surface  Tumor Site: Anterior wall  Tumor Size: 6 cm  Histologic Type: High grade sarcoma, mixed. See comment.  Other Tissue/ Organ Involvement: Not identified  Lymphovascular Invasion: Present, extensive.  Margins: All margins negative for tumor  Regional Lymph Nodes: Not applicable (no lymph nodes submitted or found)  Distant Metastasis:       Distant Site(s) Involved: Not applicable  Pathologic Stage Classification (pTNM, AJCC 8th Edition): pT1b, pN not  assigned  Ancillary Studies: Can be performed upon request  Representative Tumor Block: A6  Comment(s): The tumor has two morphologically different components. One component consists of malignant spindle cells which can be seen arising from the smooth muscle. These  foci are positive for SMA, desmin, cyclinD1 (focal), and CD10 (patchy). The other component consists of round to slightly spindle cells with abundant admixed vessels and occasional large pleomorphic cells. These areas are positive for SMA (patchy weak), desmin (focal), CD10 (variable with diffuse areas). Pancytokeratin is negative. 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   ADDENDUM:   PROGNOSTIC INDICATOR RESULTS:   Immunohistochemical and morphometric analysis performed manually    Estrogen Receptor:       POSITIVE, 50%, WEAK TO MODERATE STAINING   Progesterone Receptor:   POSITIVE, 50%, MODERATE TO STRONG STAINING   Reference Range Estrogen and Progesterone Receptor       Negative  0%       Positive  >1%    06/03/2021 Surgery   Surgeon: Donaciano Eva    Assistants: Dr Lahoma Crocker (an MD assistant was necessary for tissue manipulation, management of robotic instrumentation, retraction and positioning due to the complexity of the case and hospital policies).  Operation: Robotic-assisted laparoscopic total hysterectomy >250gm with bilateral salpingoophorectomy, minilaparotomy for specimen delivery   Surgeon: Donaciano Eva    Operative Findings:  : Bulky 16cm uterus with intra-uterine mass (consistent with a fibroid-like mass) , smooth normal appearing serosa, no suspicious bulky nodes. Normal ovaries bilaterally. Normal upper abdomen. Uterus too large to deliver vaginally in tact.    06/18/2021 Imaging   1. Multiple bilateral pulmonary nodules, measuring up to 9 mm. Most of these are perifissural and subpleural in location, likely lymph nodes. Nevertheless, close follow-up recommended to exclude metastatic disease. 2. 11 mm hypoattenuating lesion towards the dome of the liver, indeterminate. MRI abdomen with and without contrast could be used to further evaluate as clinically warranted. 3. Aortic Atherosclerosis (ICD10-I70.0).   10/13/2021 Imaging   1. No acute findings within the abdomen or pelvis. No specific findings identified to suggest residual or recurrence of tumor. 2. Stable small pulmonary nodules within the left lower lobe.   03/18/2022 Imaging   1. Interval development of 2 soft tissue nodules in the anterior pelvis measuring 10 mm and 7 mm respectively. Close follow-up recommended to exclude metastatic disease. PET-CT may be warranted to further evaluate. 2. Development of 2 mm left upper lobe parenchymal nodule, nonspecific. Close attention on follow-up imaging recommended  3. Stable appearance of index  bilateral pulmonary nodules identified previously. 4. Aortic Atherosclerosis (ICD10-I70.0).   04/02/2022 PET scan   1. Signs of local tumor recurrence noted at the vaginal cuff. 2. 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 New since 10/13/2021 and progressive when compared with 03/17/2022. 3. New focal area of increased uptake within the right biceps femoris muscle which is suspicious for skeletal muscle involvement. 4. Stable appearance of small pulmonary nodules described on 03/17/2022. These are too small to characterize by PET-CT.   04/09/2022 Procedure   Successful placement of a right IJ approach Power Port with ultrasound and fluoroscopic guidance. The catheter is ready for use.   04/09/2022 Echocardiogram    1. Left ventricular ejection fraction, by estimation, is 65 to 70%. The left ventricle has normal function. The left ventricle has no regional wall motion abnormalities. There is mild concentric left ventricular hypertrophy. Left ventricular diastolic parameters are consistent with Grade I diastolic dysfunction (impaired relaxation).  2. Right ventricular systolic function is normal. The right ventricular size is normal.  3. The mitral valve is normal in structure. No evidence of mitral valve regurgitation.  4.  The aortic valve is tricuspid. Aortic valve regurgitation is not visualized. No aortic stenosis is present.  5. The inferior vena cava is normal in size with greater than 50% respiratory variability, suggesting right atrial pressure of 3 mmHg.   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 Status post hysterectomy and bilateral oophorectomy. Multiple newand enlarged peritoneal nodules throughout the low abdomen and pelvis, previously FDG avid and consistent with worsened locally recurrent and peritoneal metastatic disease. 2. Multiple  small bilateral pulmonary nodules, several of which are slightly enlarged compared to prior examination, consistent with worsened pulmonary metastatic disease.   Aortic Atherosclerosis (ICD10-I70.0).     06/25/2022 - 07/06/2022 Chemotherapy   Patient is on Treatment Plan : UTERINE UNDIFFERENTIATED / LEIOMYOSARCOMA Gemcitabine D1,8 + Docetaxel D8 (900/100) q21d     06/25/2022 -  Chemotherapy   Patient is on Treatment Plan : UTERINE UNDIFFERENTIATED LEIOMYOSARCOMA Gemcitabine D1,8 + Docetaxel D8 (900/100) q21d     09/02/2022 Imaging   1. Moderate response to therapy of peritoneal metastasis within the anterior pelvis. 2. Minimal response to therapy of pulmonary metastasis. 3. No new or progressive disease. 4.  Aortic Atherosclerosis (ICD10-I70.0).     12/01/2022 Imaging   1. No new or progressive findings in the chest, abdomen, or pelvis. 2. Peritoneal metastases in the pelvis described previously measure smaller today compatible with interval response to therapy. 3. Stable tiny bilateral pulmonary nodules. Previously characterized as metastatic lesions, these are unchanged in the interval. No new suspicious pulmonary nodule or mass. 4.  Aortic Atherosclerois (ICD10-170.0)     PHYSICAL EXAMINATION: ECOG PERFORMANCE STATUS: 1 - Symptomatic but completely ambulatory  Vitals:   12/03/22 0823  BP: 135/67  Pulse: (!) 121  Resp: 18  Temp: 98.1 F (36.7 C)  SpO2: 100%   Filed Weights   12/03/22 0823  Weight: 195 lb 9.6 oz (88.7 kg)    GENERAL:alert, no distress and comfortable SKIN: skin color, texture, turgor are normal, no rashes or significant lesions EYES: normal, Conjunctiva are pink and non-injected, sclera clear OROPHARYNX:no exudate, no erythema and lips, buccal mucosa, and tongue normal  NECK: supple, thyroid normal size, non-tender, without nodularity LYMPH:  no palpable lymphadenopathy in the cervical, axillary or inguinal LUNGS: clear to auscultation and percussion  with normal breathing effort HEART: regular rate & rhythm and no murmurs with mild bilateral lower extremity edema ABDOMEN:abdomen soft, non-tender and normal bowel sounds Musculoskeletal:no cyanosis of digits and no clubbing  NEURO: alert & oriented x 3 with fluent speech, no focal motor/sensory deficits  LABORATORY DATA:  I have reviewed the data as listed    Component Value Date/Time   NA 136 12/01/2022 0843   K 3.4 (L) 12/01/2022 0843   CL 99 12/01/2022 0843   CO2 28 12/01/2022 0843   GLUCOSE 99 12/01/2022 0843   BUN 14 12/01/2022 0843   CREATININE 0.65 12/01/2022 0843   CALCIUM 9.2 12/01/2022 0843   PROT 6.4 (L) 12/01/2022 0843   ALBUMIN 3.4 (L) 12/01/2022 0843   AST 23 12/01/2022 0843   ALT 16 12/01/2022 0843   ALKPHOS 177 (H) 12/01/2022 0843   BILITOT 0.4 12/01/2022 0843   GFRNONAA >60 12/01/2022 0843    No results found for: "SPEP", "UPEP"  Lab Results  Component Value Date   WBC 47.9 (H) 12/01/2022   NEUTROABS 34.5 (H) 12/01/2022   HGB 11.8 (L) 12/01/2022  HCT 34.9 (L) 12/01/2022   MCV 96.1 12/01/2022   PLT 172 12/01/2022      Chemistry      Component Value Date/Time   NA 136 12/01/2022 0843   K 3.4 (L) 12/01/2022 0843   CL 99 12/01/2022 0843   CO2 28 12/01/2022 0843   BUN 14 12/01/2022 0843   CREATININE 0.65 12/01/2022 0843      Component Value Date/Time   CALCIUM 9.2 12/01/2022 0843   ALKPHOS 177 (H) 12/01/2022 0843   AST 23 12/01/2022 0843   ALT 16 12/01/2022 0843   BILITOT 0.4 12/01/2022 0843       RADIOGRAPHIC STUDIES: I have reviewed multiple imaging studies with the patient I have personally reviewed the radiological images as listed and agreed with the findings in the report. CT CHEST ABDOMEN PELVIS W CONTRAST  Result Date: 12/01/2022 CLINICAL DATA:  Uterine/cervical cancer. Uterine sarcoma. Restaging. * Tracking Code: BO * EXAM: CT CHEST, ABDOMEN, AND PELVIS WITH CONTRAST TECHNIQUE: Multidetector CT imaging of the chest, abdomen and  pelvis was performed following the standard protocol during bolus administration of intravenous contrast. RADIATION DOSE REDUCTION: This exam was performed according to the departmental dose-optimization program which includes automated exposure control, adjustment of the mA and/or kV according to patient size and/or use of iterative reconstruction technique. CONTRAST:  163m OMNIPAQUE IOHEXOL 300 MG/ML  SOLN COMPARISON:  09/01/2022 FINDINGS: CT CHEST FINDINGS Cardiovascular: The heart size is normal. No substantial pericardial effusion. No thoracic aortic aneurysm. No substantial atherosclerosis of the thoracic aorta. Right Port-A-Cath tip is positioned in the distal SVC near the junction with the RA. Mediastinum/Nodes: No mediastinal lymphadenopathy. There is no hilar lymphadenopathy. The esophagus has normal imaging features. There is no axillary lymphadenopathy. Lungs/Pleura: Stable 9 x 6 mm paraspinal subpleural nodule right lower lobe on 71/4. 3 mm nodule right lower lobe on 96/4 is unchanged. Subpleural tiny left lower lobe nodule on 94/4 is stable. No new suspicious pulmonary nodule or mass. No focal airspace consolidation. No pleural effusion. Musculoskeletal: No worrisome lytic or sclerotic osseous abnormality. CT ABDOMEN PELVIS FINDINGS Hepatobiliary: 10 mm hypodensity in the dome of the liver is similar to prior. This is probably a cyst. No followup imaging is recommended. There is no evidence for gallstones, gallbladder wall thickening, or pericholecystic fluid. No intrahepatic or extrahepatic biliary dilation. Pancreas: No focal mass lesion. No dilatation of the main duct. No intraparenchymal cyst. No peripancreatic edema. Spleen: No splenomegaly. No focal mass lesion. Adrenals/Urinary Tract: No adrenal nodule or mass. Tiny hypoattenuating lower pole lesions right kidney are stable, likely benign. No followup imaging is recommended. Left kidney unremarkable. No evidence for hydroureter. The urinary  bladder appears normal for the degree of distention. Stomach/Bowel: Stomach is unremarkable. No gastric wall thickening. No evidence of outlet obstruction. Duodenum is normally positioned as is the ligament of Treitz. No small bowel wall thickening. No small bowel dilatation. The terminal ileum is normal. The appendix is normal. No gross colonic mass. No colonic wall thickening. Vascular/Lymphatic: There is mild atherosclerotic calcification of the abdominal aorta without aneurysm. There is no gastrohepatic or hepatoduodenal ligament lymphadenopathy. No retroperitoneal or mesenteric lymphadenopathy. No pelvic sidewall lymphadenopathy. Reproductive: Hysterectomy.  There is no adnexal mass. Other: Anterior pelvic nodule measured previously at 2.1 x 2.0 cm is 2.1 x 1.6 cm today on image 106/2. Previously described vaginal cuff implant on the right measures 1.9 x 0.9 cm today (113/2) compared to 2.1 x 1.4 cm previously. Additional anterior pelvic nodules visible on 114/2  are smaller in the interval. No intraperitoneal free fluid. Musculoskeletal: No worrisome lytic or sclerotic osseous abnormality. Stable hemangioma in the L3 vertebral body. IMPRESSION: 1. No new or progressive findings in the chest, abdomen, or pelvis. 2. Peritoneal metastases in the pelvis described previously measure smaller today compatible with interval response to therapy. 3. Stable tiny bilateral pulmonary nodules. Previously characterized as metastatic lesions, these are unchanged in the interval. No new suspicious pulmonary nodule or mass. 4.  Aortic Atherosclerois (ICD10-170.0) Electronically Signed   By: Misty Stanley M.D.   On: 12/01/2022 10:42

## 2022-12-03 NOTE — Assessment & Plan Note (Addendum)
I have reviewed multiple imaging studies with the patient and her husband Overall, she has excellent response to therapy We discussed the risk and benefits of continuing treatment versus taking a break and for now she would like to continue treatment The patient is prone to develop significant fluid retention We discussed importance of dietary modification and only use furosemide if her weight is over 200 pounds I plan to reduce the dose of Taxotere little bit for future treatment

## 2022-12-03 NOTE — Assessment & Plan Note (Signed)
Her dietary oral protein intake is low She has declining values of total protein and albumin We discussed importance of dietary changes while on treatment

## 2022-12-03 NOTE — Patient Instructions (Signed)
[facility] MEDICAL ONCOLOGY  Discharge Instructions: Thank you for choosing [facility] to provide your oncology and hematology care.   If you have a lab appointment with the Cancer Center, please go directly to the Cancer Center and check in at the registration area.   Wear comfortable clothing and clothing appropriate for easy access to any Portacath or PICC line.   We strive to give you quality time with your provider. You may need to reschedule your appointment if you arrive late (15 or more minutes).  Arriving late affects you and other patients whose appointments are after yours.  Also, if you miss three or more appointments without notifying the office, you may be dismissed from the clinic at the provider's discretion.      For prescription refill requests, have your pharmacy contact our office and allow 72 hours for refills to be completed.    Today you received the following chemotherapy and/or immunotherapy agents: Gemcitabine.       To help prevent nausea and vomiting after your treatment, we encourage you to take your nausea medication as directed.  BELOW ARE SYMPTOMS THAT SHOULD BE REPORTED IMMEDIATELY: *FEVER GREATER THAN 100.4 F (38 C) OR HIGHER *CHILLS OR SWEATING *NAUSEA AND VOMITING THAT IS NOT CONTROLLED WITH YOUR NAUSEA MEDICATION *UNUSUAL SHORTNESS OF BREATH *UNUSUAL BRUISING OR BLEEDING *URINARY PROBLEMS (pain or burning when urinating, or frequent urination) *BOWEL PROBLEMS (unusual diarrhea, constipation, pain near the anus) TENDERNESS IN MOUTH AND THROAT WITH OR WITHOUT PRESENCE OF ULCERS (sore throat, sores in mouth, or a toothache) UNUSUAL RASH, SWELLING OR PAIN  UNUSUAL VAGINAL DISCHARGE OR ITCHING   Items with * indicate a potential emergency and should be followed up as soon as possible or go to the Emergency Department if any problems should occur.  Please show the CHEMOTHERAPY ALERT CARD or IMMUNOTHERAPY ALERT CARD at check-in  to the Emergency Department and triage nurse.  Should you have questions after your visit or need to cancel or reschedule your appointment, please contact [facility] MEDICAL ONCOLOGY  Dept: [phone]  and follow the prompts.  Office hours are 8:00 a.m. to 4:30 p.m. Monday - Friday. Please note that voicemails left after 4:00 p.m. may not be returned until the following business day.  We are closed weekends and major holidays. You have access to a nurse at all times for urgent questions. Please call the main number to the clinic Dept: [phone] and follow the prompts.   For any non-urgent questions, you may also contact your provider using MyChart. We now offer e-Visits for anyone 18 and older to request care online for non-urgent symptoms. For details visit [email or web address].   Also download the MyChart app! Go to the app store, search "MyChart", open the app, select [facility], and log in with your MyChart username and password.  Masks are optional in the cancer centers. If you would like for your care team to wear a mask while they are taking care of you, please let them know. You may have one support person who is at least 64 years old accompany you for your appointments. 

## 2022-12-03 NOTE — Assessment & Plan Note (Signed)
This has improved We discussed importance of dietary modification and third spacing as a cause of fluid retention She will use furosemide only if her weight is over 200 pounds 

## 2022-12-04 ENCOUNTER — Other Ambulatory Visit: Payer: Self-pay

## 2022-12-17 ENCOUNTER — Other Ambulatory Visit: Payer: Managed Care, Other (non HMO)

## 2022-12-17 ENCOUNTER — Ambulatory Visit: Payer: Managed Care, Other (non HMO)

## 2022-12-23 MED FILL — Dexamethasone Sodium Phosphate Inj 100 MG/10ML: INTRAMUSCULAR | Qty: 1 | Status: AC

## 2022-12-24 ENCOUNTER — Other Ambulatory Visit: Payer: Managed Care, Other (non HMO)

## 2022-12-24 ENCOUNTER — Inpatient Hospital Stay: Payer: Managed Care, Other (non HMO)

## 2022-12-24 ENCOUNTER — Ambulatory Visit: Payer: Managed Care, Other (non HMO)

## 2022-12-24 ENCOUNTER — Inpatient Hospital Stay: Payer: Managed Care, Other (non HMO) | Attending: Gynecologic Oncology

## 2022-12-24 ENCOUNTER — Inpatient Hospital Stay (HOSPITAL_BASED_OUTPATIENT_CLINIC_OR_DEPARTMENT_OTHER): Payer: Managed Care, Other (non HMO) | Admitting: Hematology and Oncology

## 2022-12-24 ENCOUNTER — Encounter: Payer: Self-pay | Admitting: Hematology and Oncology

## 2022-12-24 VITALS — BP 146/76 | HR 95 | Temp 98.0°F | Resp 18 | Ht 67.5 in | Wt 202.2 lb

## 2022-12-24 DIAGNOSIS — C786 Secondary malignant neoplasm of retroperitoneum and peritoneum: Secondary | ICD-10-CM | POA: Insufficient documentation

## 2022-12-24 DIAGNOSIS — R600 Localized edema: Secondary | ICD-10-CM

## 2022-12-24 DIAGNOSIS — D6481 Anemia due to antineoplastic chemotherapy: Secondary | ICD-10-CM

## 2022-12-24 DIAGNOSIS — Z5111 Encounter for antineoplastic chemotherapy: Secondary | ICD-10-CM | POA: Insufficient documentation

## 2022-12-24 DIAGNOSIS — C549 Malignant neoplasm of corpus uteri, unspecified: Secondary | ICD-10-CM

## 2022-12-24 DIAGNOSIS — T451X5A Adverse effect of antineoplastic and immunosuppressive drugs, initial encounter: Secondary | ICD-10-CM

## 2022-12-24 DIAGNOSIS — Z5189 Encounter for other specified aftercare: Secondary | ICD-10-CM | POA: Insufficient documentation

## 2022-12-24 DIAGNOSIS — Z23 Encounter for immunization: Secondary | ICD-10-CM | POA: Diagnosis not present

## 2022-12-24 DIAGNOSIS — C55 Malignant neoplasm of uterus, part unspecified: Secondary | ICD-10-CM | POA: Diagnosis not present

## 2022-12-24 LAB — CBC WITH DIFFERENTIAL (CANCER CENTER ONLY)
Abs Immature Granulocytes: 0.03 10*3/uL (ref 0.00–0.07)
Basophils Absolute: 0.1 10*3/uL (ref 0.0–0.1)
Basophils Relative: 1 %
Eosinophils Absolute: 0.2 10*3/uL (ref 0.0–0.5)
Eosinophils Relative: 3 %
HCT: 31.6 % — ABNORMAL LOW (ref 36.0–46.0)
Hemoglobin: 10.4 g/dL — ABNORMAL LOW (ref 12.0–15.0)
Immature Granulocytes: 0 %
Lymphocytes Relative: 32 %
Lymphs Abs: 2.1 10*3/uL (ref 0.7–4.0)
MCH: 31.6 pg (ref 26.0–34.0)
MCHC: 32.9 g/dL (ref 30.0–36.0)
MCV: 96 fL (ref 80.0–100.0)
Monocytes Absolute: 1.1 10*3/uL — ABNORMAL HIGH (ref 0.1–1.0)
Monocytes Relative: 17 %
Neutro Abs: 3.1 10*3/uL (ref 1.7–7.7)
Neutrophils Relative %: 47 %
Platelet Count: 525 10*3/uL — ABNORMAL HIGH (ref 150–400)
RBC: 3.29 MIL/uL — ABNORMAL LOW (ref 3.87–5.11)
RDW: 19.9 % — ABNORMAL HIGH (ref 11.5–15.5)
WBC Count: 6.7 10*3/uL (ref 4.0–10.5)
nRBC: 0 % (ref 0.0–0.2)

## 2022-12-24 LAB — CMP (CANCER CENTER ONLY)
ALT: 9 U/L (ref 0–44)
AST: 16 U/L (ref 15–41)
Albumin: 3.2 g/dL — ABNORMAL LOW (ref 3.5–5.0)
Alkaline Phosphatase: 82 U/L (ref 38–126)
Anion gap: 6 (ref 5–15)
BUN: 15 mg/dL (ref 8–23)
CO2: 27 mmol/L (ref 22–32)
Calcium: 9.1 mg/dL (ref 8.9–10.3)
Chloride: 104 mmol/L (ref 98–111)
Creatinine: 0.56 mg/dL (ref 0.44–1.00)
GFR, Estimated: >60 mL/min (ref >60)
Glucose, Bld: 85 mg/dL (ref 70–99)
Potassium: 4 mmol/L (ref 3.5–5.1)
Sodium: 137 mmol/L (ref 135–145)
Total Bilirubin: 0.3 mg/dL (ref 0.3–1.2)
Total Protein: 5.7 g/dL — ABNORMAL LOW (ref 6.5–8.1)

## 2022-12-24 MED ORDER — SODIUM CHLORIDE 0.9% FLUSH
10.0000 mL | INTRAVENOUS | Status: DC | PRN
  Administered 2022-12-24: 10 mL

## 2022-12-24 MED ORDER — HEPARIN SOD (PORK) LOCK FLUSH 100 UNIT/ML IV SOLN
500.0000 [IU] | Freq: Once | INTRAVENOUS | Status: AC | PRN
  Administered 2022-12-24: 500 [IU]

## 2022-12-24 MED ORDER — SODIUM CHLORIDE 0.9% FLUSH
10.0000 mL | Freq: Once | INTRAVENOUS | Status: AC
  Administered 2022-12-24: 10 mL

## 2022-12-24 MED ORDER — SODIUM CHLORIDE 0.9 % IV SOLN
60.0000 mg/m2 | Freq: Once | INTRAVENOUS | Status: AC
  Administered 2022-12-24: 124 mg via INTRAVENOUS
  Filled 2022-12-24: qty 12.4

## 2022-12-24 MED ORDER — SODIUM CHLORIDE 0.9 % IV SOLN
600.0000 mg/m2 | Freq: Once | INTRAVENOUS | Status: AC
  Administered 2022-12-24: 1255 mg via INTRAVENOUS
  Filled 2022-12-24: qty 33.01

## 2022-12-24 MED ORDER — PROCHLORPERAZINE MALEATE 10 MG PO TABS
10.0000 mg | ORAL_TABLET | Freq: Once | ORAL | Status: AC
  Administered 2022-12-24: 10 mg via ORAL
  Filled 2022-12-24: qty 1

## 2022-12-24 MED ORDER — SODIUM CHLORIDE 0.9 % IV SOLN
10.0000 mg | Freq: Once | INTRAVENOUS | Status: AC
  Administered 2022-12-24: 10 mg via INTRAVENOUS
  Filled 2022-12-24: qty 10

## 2022-12-24 MED ORDER — INFLUENZA VAC SPLIT QUAD 0.5 ML IM SUSY
0.5000 mL | PREFILLED_SYRINGE | Freq: Once | INTRAMUSCULAR | Status: AC
  Administered 2022-12-24: 0.5 mL via INTRAMUSCULAR
  Filled 2022-12-24: qty 0.5

## 2022-12-24 MED ORDER — SODIUM CHLORIDE 0.9 % IV SOLN: Freq: Once | INTRAVENOUS | Status: DC

## 2022-12-24 MED ORDER — SODIUM CHLORIDE 0.9 % IV SOLN: Freq: Once | INTRAVENOUS | Status: AC

## 2022-12-24 NOTE — Progress Notes (Signed)
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 reviewed multiple imaging studies with the patient and her husband Overall, she has excellent response to therapy We discussed the risk and benefits of continuing treatment versus taking a break and for now she would like to continue treatment The patient is prone to develop significant fluid retention We discussed importance of dietary modification and only use furosemide if her weight is over 200 pounds I plan to reduce the dose of Taxotere little bit for future treatment  Anemia due to antineoplastic chemotherapy Her imaging anemia has improved with recent changes to her treatment plan She does not need blood transfusion today   Bilateral leg edema This has improved We discussed importance of dietary modification and third spacing as a cause of fluid retention She will use furosemide only if her weight is over 200 pounds  No orders of the defined types were placed in this encounter.   All questions were answered. The patient knows to call the clinic with any problems, questions or concerns. The total time spent in the appointment was 25 minutes encounter with patients including review of chart and various tests results, discussions about plan of care and coordination of care plan   [name], MD 12/24/2022 11:29 AM  INTERVAL HISTORY: Please see below for problem oriented charting. she returns for treatment follow-up seen prior to chemotherapy She continues to have mild intermittent numbness from neuropathy Her leg swelling is intermittently improved with diuretic therapy She denies recent skin rash  REVIEW OF SYSTEMS:   Constitutional: Denies fevers, chills or abnormal weight loss Eyes:  Denies blurriness of vision Ears, nose, mouth, throat, and face: Denies mucositis or sore throat Respiratory: Denies cough, dyspnea or wheezes Cardiovascular: Denies palpitation, chest discomfort  Gastrointestinal:  Denies nausea, heartburn or change in bowel habits Skin: Denies abnormal skin rashes Lymphatics: Denies new lymphadenopathy or easy bruising Behavioral/Psych: Mood is stable, no new changes  All other systems were reviewed with the patient and are negative.  I have reviewed the past medical history, past surgical history, social history and family history with the patient and they are unchanged from previous note.  ALLERGIES:  has No Known Allergies.  MEDICATIONS:  Current Outpatient Medications  Medication Sig Dispense Refill   acetaminophen (TYLENOL) 500 MG tablet Take 500 mg by mouth every 6 (six) hours as needed for mild pain, moderate pain or fever.     aspirin 81 MG EC tablet Take by mouth daily.     cetirizine (ZYRTEC) 10 MG tablet Take 10 mg by mouth daily.     Cholecalciferol (VITAMIN D3 PO) Take 1 tablet by mouth daily.     dexamethasone (DECADRON) 4 MG tablet Take 2 pills the day before chemo and daily for 2 days after chemo 30 tablet 0   furosemide (LASIX) 20 MG tablet Take 1 tablet (20 mg total) by mouth daily. 30 tablet 0   lidocaine-prilocaine (EMLA) cream Apply 1 Application topically as needed. 30 g 3   LORazepam (ATIVAN) 0.5 MG tablet Take 1 tablet (0.5 mg total) by mouth 2 (two) times daily as needed for anxiety. 3 tablet 0   losartan-hydrochlorothiazide (HYZAAR) 100-25 MG tablet Take 0.5 tablets by mouth daily.     magic mouthwash (nystatin, lidocaine, diphenhydrAMINE, alum &  mag hydroxide) suspension Swish and swallow 5 mLs by mouth 4 (four) times daily for 7 days 150 mL 0   omeprazole (PRILOSEC) 20 MG capsule Take 1 capsule (20 mg total) by mouth 2 (two) times daily. 28 capsule 0   ondansetron (ZOFRAN) 8 MG tablet Take 1 tablet (8 mg total) by mouth every  8 (eight) hours as needed for nausea. 30 tablet 3   No current facility-administered medications for this visit.   Facility-Administered Medications Ordered in Other Visits  Medication Dose Route Frequency Provider Last Rate Last Admin   0.9 %  sodium chloride infusion   Intravenous Once [name], Vaniyah Lansky, MD       DOCEtaxel (TAXOTERE) 124 mg in sodium chloride 0.9 % 250 mL chemo infusion  60 mg/m2 (Treatment Plan Recorded) Intravenous Once [name], Grisela Mesch, MD       gemcitabine (GEMZAR) 1,255 mg in sodium chloride 0.9 % 250 mL chemo infusion  600 mg/m2 (Treatment Plan Recorded) Intravenous Once [name], MD 189 mL/hr at 12/24/22 1039 1,255 mg at 12/24/22 1039   heparin lock flush 100 unit/mL  500 Units Intracatheter Once PRN [name], MD       influenza vac split quadrivalent PF (FLUARIX) injection 0.5 mL  0.5 mL Intramuscular Once [name], Jaynell Castagnola, MD       sodium chloride flush (NS) 0.9 % injection 10 mL  10 mL Intracatheter PRN [name], MD        SUMMARY OF ONCOLOGIC HISTORY: Oncology History Overview Note  High grade and LMS, 50% ER/PR positive dMMR normal, PD-L1 CPS 3%   Uterine sarcoma ([place])  06/02/2021 Imaging   MRI pelvis Heterogeneous 9.0 cm intrauterine mass within the intramural/submucosal anterior fundus demonstrating interval growth, internal enhancement, and restricted diffusion 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 No extra uterine extension. No evidence of metastatic disease within the pelvis.   Mild thickening of the endometrial stripe, likely related to entrapment by the adjacent uterine mass   06/03/2021 Initial Diagnosis   Uterine sarcoma ([place])   06/03/2021 Cancer Staging   Staging form: Corpus Uteri - Sarcoma, AJCC 7th Edition - Clinical stage from 06/03/2021: FIGO Stage IVB (rT1b, N0, M1) - Signed by [name], MD on 03/31/2022 Diagnostic confirmation: Positive histology Stage prefix: Recurrence Biopsy of metastatic site  performed: No Lymph-vascular invasion (LVI): LVI present/identified, NOS   06/03/2021 Pathology Results   FINAL MICROSCOPIC DIAGNOSIS:   A. UTERUS, CERVIX, BILATERAL FALLOPIAN TUBES AND OVARIES, HYSTERECTOMY:  - Uterus:       Mixed high grade uterine sarcoma, spanning 6 cm, see comment.       Extensive lymphovascular invasion.       See oncology table.  - Cervix: Benign squamous and endocervical mucosa. No dysplasia or  malignancy.  - Bilateral ovaries: Unremarkable. No malignancy.  - Bilateral fallopian tubes: Unremarkable. No malignancy.   ONCOLOGY TABLE:   UTERUS, SARCOMA: Resection   Procedure: Total hysterectomy and bilateral salpingo-oophorectomy  Specimen Integrity: Focally disrupted on posterior surface  Tumor Site: Anterior wall  Tumor Size: 6 cm  Histologic Type: High grade sarcoma, mixed. See comment.  Other Tissue/ Organ Involvement: Not identified  Lymphovascular Invasion: Present, extensive.  Margins: All margins negative for tumor  Regional Lymph Nodes: Not applicable (no lymph nodes submitted or found)  Distant Metastasis:       Distant Site(s) Involved: Not applicable  Pathologic Stage Classification (pTNM, AJCC 8th Edition): pT1b, pN not  assigned  Ancillary Studies: Can be performed upon request  Representative Tumor Block: A6  Comment(s): The tumor has two morphologically different components. One component consists of malignant spindle cells which can be seen arising from the smooth muscle. These foci are positive for SMA, desmin, cyclinD1 (focal), and CD10 (patchy). The other component consists of round to slightly spindle cells with abundant admixed vessels and occasional large pleomorphic cells. These areas are positive for SMA (patchy weak), desmin (focal), CD10 (variable with diffuse areas). Pancytokeratin is negative. 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   ADDENDUM:    PROGNOSTIC INDICATOR RESULTS:   Immunohistochemical and morphometric analysis performed manually    Estrogen Receptor:       POSITIVE, 50%, WEAK TO MODERATE STAINING  Progesterone Receptor:   POSITIVE, 50%, MODERATE TO STRONG STAINING   Reference Range Estrogen and Progesterone Receptor       Negative  0%       Positive  >1%    06/03/2021 Surgery   Surgeon: Donaciano Eva    Assistants: Dr Lahoma Crocker (an MD assistant was necessary for tissue manipulation, management of robotic instrumentation, retraction and positioning due to the complexity of the case and hospital policies).  Operation: Robotic-assisted laparoscopic total hysterectomy >250gm with bilateral salpingoophorectomy, minilaparotomy for specimen delivery   Surgeon: Donaciano Eva    Operative Findings:  : Bulky 16cm uterus with intra-uterine mass (consistent with a fibroid-like mass) , smooth normal appearing serosa, no suspicious bulky nodes. Normal ovaries bilaterally. Normal upper abdomen. Uterus too large to deliver vaginally in tact.    06/18/2021 Imaging   1. Multiple bilateral pulmonary nodules, measuring up to 9 mm. Most of these are perifissural and subpleural in location, likely lymph nodes. Nevertheless, close follow-up recommended to exclude metastatic disease. 2. 11 mm hypoattenuating lesion towards the dome of the liver, indeterminate. MRI abdomen with and without contrast could be used to further evaluate as clinically warranted. 3. Aortic Atherosclerosis (ICD10-I70.0).   10/13/2021 Imaging   1. No acute findings within the abdomen or pelvis. No specific findings identified to suggest residual or recurrence of tumor. 2. Stable small pulmonary nodules within the left lower lobe.   03/18/2022 Imaging   1. Interval development of 2 soft tissue nodules in the anterior pelvis measuring 10 mm and 7 mm respectively. Close follow-up recommended to exclude metastatic disease. PET-CT may be warranted  to further evaluate. 2. Development of 2 mm left upper lobe parenchymal nodule, nonspecific. Close attention on follow-up imaging recommended  3. Stable appearance of index bilateral pulmonary nodules identified previously. 4. Aortic Atherosclerosis (ICD10-I70.0).   04/02/2022 PET scan   1. Signs of local tumor recurrence noted at the vaginal cuff. 2. 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 New since 10/13/2021 and progressive when compared with 03/17/2022. 3. New focal area of increased uptake within the right biceps femoris muscle which is suspicious for skeletal muscle involvement. 4. Stable appearance of small pulmonary nodules described on 03/17/2022. These are too small to characterize by PET-CT.   04/09/2022 Procedure   Successful placement of a right IJ approach Power Port with ultrasound and fluoroscopic guidance. The catheter is ready for use.   04/09/2022 Echocardiogram    1. Left ventricular ejection fraction, by estimation, is 65 to 70%. The left ventricle has normal function. The left ventricle has no regional wall motion abnormalities. There is mild concentric left ventricular hypertrophy. Left ventricular diastolic parameters are consistent with Grade I diastolic dysfunction (impaired relaxation).  2. Right ventricular systolic function is normal. The right ventricular size is normal.  3. The mitral valve is normal in structure. No evidence of mitral valve regurgitation.  4. The aortic valve is tricuspid. Aortic valve regurgitation is not visualized. No aortic stenosis is present.  5. The inferior vena cava is normal in size with greater than 50% respiratory variability, suggesting right atrial pressure of 3 mmHg.   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 Status post hysterectomy and bilateral oophorectomy. Multiple newand enlarged  peritoneal nodules throughout the low abdomen and pelvis, previously FDG avid and consistent with worsened locally recurrent and peritoneal metastatic disease. 2. Multiple small bilateral pulmonary nodules, several of which are slightly enlarged compared to prior examination, consistent with worsened pulmonary metastatic disease.   Aortic Atherosclerosis (ICD10-I70.0).     06/25/2022 - 07/06/2022 Chemotherapy   Patient is on Treatment Plan : UTERINE UNDIFFERENTIATED / LEIOMYOSARCOMA Gemcitabine D1,8 + Docetaxel D8 (900/100) q21d     06/25/2022 -  Chemotherapy   Patient is on Treatment Plan : UTERINE UNDIFFERENTIATED LEIOMYOSARCOMA Gemcitabine D1,8 + Docetaxel D8 (900/100) q21d     09/02/2022 Imaging   1. Moderate response to therapy of peritoneal metastasis within the anterior pelvis. 2. Minimal response to therapy of pulmonary metastasis. 3. No new or progressive disease. 4.  Aortic Atherosclerosis (ICD10-I70.0).     12/01/2022 Imaging   1. No new or progressive findings in the chest, abdomen, or pelvis. 2. Peritoneal metastases in the pelvis described previously measure smaller today compatible with interval response to therapy. 3. Stable tiny bilateral pulmonary nodules. Previously characterized as metastatic lesions, these are unchanged in the interval. No new suspicious pulmonary nodule or mass. 4.  Aortic Atherosclerois (ICD10-170.0)     PHYSICAL EXAMINATION: ECOG PERFORMANCE STATUS: 2 - Symptomatic, <50% confined to bed  Vitals:   12/24/22 0905  BP: (!) 146/76  Pulse: 95  Resp: 18  Temp: 98 F (36.7 C)  SpO2: 100%   Filed Weights   12/24/22 0905  Weight: 202 lb 3.2 oz (91.7 kg)    GENERAL:alert, no distress and comfortable  NEURO: alert & oriented x 3 with fluent speech, no focal motor/sensory deficits  LABORATORY DATA:  I have reviewed the data as listed    Component Value Date/Time   NA 137 12/24/2022 0840   K 4.0 12/24/2022 0840   CL 104 12/24/2022 0840    CO2 27 12/24/2022 0840   GLUCOSE 85 12/24/2022 0840   BUN 15 12/24/2022 0840   CREATININE 0.56 12/24/2022 0840   CALCIUM 9.1 12/24/2022 0840   PROT 5.7 (L) 12/24/2022 0840   ALBUMIN 3.2 (L) 12/24/2022 0840   AST 16 12/24/2022 0840   ALT 9 12/24/2022 0840   ALKPHOS 82 12/24/2022 0840   BILITOT 0.3 12/24/2022 0840   GFRNONAA >60 12/24/2022 0840    No results found for: "SPEP", "UPEP"  Lab Results  Component Value Date   WBC 6.7 12/24/2022   NEUTROABS 3.1 12/24/2022   HGB 10.4 (L) 12/24/2022   HCT 31.6 (L) 12/24/2022   MCV 96.0 12/24/2022   PLT 525 (H) 12/24/2022      Chemistry      Component Value Date/Time   NA 137 12/24/2022 0840   K 4.0 12/24/2022 0840   CL 104 12/24/2022 0840   CO2 27 12/24/2022 0840   BUN 15 12/24/2022 0840   CREATININE  0.56 12/24/2022 0840      Component Value Date/Time   CALCIUM 9.1 12/24/2022 0840   ALKPHOS 82 12/24/2022 0840   AST 16 12/24/2022 0840   ALT 9 12/24/2022 0840   BILITOT 0.3 12/24/2022 0840

## 2022-12-24 NOTE — Assessment & Plan Note (Signed)
Her imaging anemia has improved with recent changes to her treatment plan She does not need blood transfusion today  

## 2022-12-24 NOTE — Patient Instructions (Signed)
[facility]  Discharge Instructions: Thank you for choosing [facility] to provide your oncology and hematology care.   If you have a lab appointment with the [facility], please go directly to the [facility] and check in at the registration area.   Wear comfortable clothing and clothing appropriate for easy access to any Portacath or PICC line.   We strive to give you quality time with your provider. You may need to reschedule your appointment if you arrive late (15 or more minutes).  Arriving late affects you and other patients whose appointments are after yours.  Also, if you miss three or more appointments without notifying the office, you may be dismissed from the clinic at the provider's discretion.      For prescription refill requests, have your pharmacy contact our office and allow 72 hours for refills to be completed.    Today you received the following chemotherapy and/or immunotherapy agents: Gemcitabine, Docetaxel.       To help prevent nausea and vomiting after your treatment, we encourage you to take your nausea medication as directed.  BELOW ARE SYMPTOMS THAT SHOULD BE REPORTED IMMEDIATELY: *FEVER GREATER THAN 100.4 F (38 C) OR HIGHER *CHILLS OR SWEATING *NAUSEA AND VOMITING THAT IS NOT CONTROLLED WITH YOUR NAUSEA MEDICATION *UNUSUAL SHORTNESS OF BREATH *UNUSUAL BRUISING OR BLEEDING *URINARY PROBLEMS (pain or burning when urinating, or frequent urination) *BOWEL PROBLEMS (unusual diarrhea, constipation, pain near the anus) TENDERNESS IN MOUTH AND THROAT WITH OR WITHOUT PRESENCE OF ULCERS (sore throat, sores in mouth, or a toothache) UNUSUAL RASH, SWELLING OR PAIN  UNUSUAL VAGINAL DISCHARGE OR ITCHING   Items with * indicate a potential emergency and should be followed up as soon as possible or go to the Emergency Department if any problems should occur.  Please show the CHEMOTHERAPY ALERT CARD or IMMUNOTHERAPY  ALERT CARD at check-in to the Emergency Department and triage nurse.  Should you have questions after your visit or need to cancel or reschedule your appointment, please contact [facility]  Dept: [phone]  and follow the prompts.  Office hours are 8:00 a.m. to 4:30 p.m. Monday - Friday. Please note that voicemails left after 4:00 p.m. may not be returned until the following business day.  We are closed weekends and major holidays. You have access to a nurse at all times for urgent questions. Please call the main number to the clinic Dept: [phone] and follow the prompts.   For any non-urgent questions, you may also contact your provider using MyChart. We now offer e-Visits for anyone 13 and older to request care online for non-urgent symptoms. For details visit [email or web address].   Also download the MyChart app! Go to the app store, search "MyChart", open the app, select [facility], and log in with your MyChart username and password.

## 2022-12-24 NOTE — Assessment & Plan Note (Signed)
I have reviewed multiple imaging studies with the patient and her husband Overall, she has excellent response to therapy We discussed the risk and benefits of continuing treatment versus taking a break and for now she would like to continue treatment The patient is prone to develop significant fluid retention We discussed importance of dietary modification and only use furosemide if her weight is over 200 pounds I plan to reduce the dose of Taxotere little bit for future treatment

## 2022-12-24 NOTE — Assessment & Plan Note (Signed)
This has improved We discussed importance of dietary modification and third spacing as a cause of fluid retention She will use furosemide only if her weight is over 200 pounds 

## 2022-12-26 ENCOUNTER — Inpatient Hospital Stay: Payer: Managed Care, Other (non HMO)

## 2022-12-26 VITALS — BP 139/75 | HR 117 | Temp 97.7°F | Resp 16

## 2022-12-26 DIAGNOSIS — Z5111 Encounter for antineoplastic chemotherapy: Secondary | ICD-10-CM | POA: Diagnosis not present

## 2022-12-26 MED ORDER — PEGFILGRASTIM INJECTION 6 MG/0.6ML ~~LOC~~
6.0000 mg | PREFILLED_SYRINGE | Freq: Once | SUBCUTANEOUS | Status: AC
  Administered 2022-12-26: 6 mg via SUBCUTANEOUS

## 2023-01-07 ENCOUNTER — Encounter: Payer: Self-pay | Admitting: Hematology and Oncology

## 2023-01-07 ENCOUNTER — Inpatient Hospital Stay: Payer: Managed Care, Other (non HMO)

## 2023-01-07 ENCOUNTER — Inpatient Hospital Stay: Payer: Managed Care, Other (non HMO) | Admitting: Hematology and Oncology

## 2023-01-07 VITALS — BP 115/70 | HR 102 | Temp 97.4°F | Resp 18 | Ht 67.5 in | Wt 194.0 lb

## 2023-01-07 VITALS — BP 123/73 | HR 91 | Temp 97.8°F | Resp 16

## 2023-01-07 DIAGNOSIS — C549 Malignant neoplasm of corpus uteri, unspecified: Secondary | ICD-10-CM

## 2023-01-07 DIAGNOSIS — R600 Localized edema: Secondary | ICD-10-CM | POA: Diagnosis not present

## 2023-01-07 DIAGNOSIS — D6481 Anemia due to antineoplastic chemotherapy: Secondary | ICD-10-CM

## 2023-01-07 DIAGNOSIS — Z5111 Encounter for antineoplastic chemotherapy: Secondary | ICD-10-CM | POA: Diagnosis not present

## 2023-01-07 DIAGNOSIS — T451X5A Adverse effect of antineoplastic and immunosuppressive drugs, initial encounter: Secondary | ICD-10-CM | POA: Diagnosis not present

## 2023-01-07 LAB — CMP (CANCER CENTER ONLY)
ALT: 9 U/L (ref 0–44)
AST: 17 U/L (ref 15–41)
Albumin: 3.2 g/dL — ABNORMAL LOW (ref 3.5–5.0)
Alkaline Phosphatase: 94 U/L (ref 38–126)
Anion gap: 7 (ref 5–15)
BUN: 12 mg/dL (ref 8–23)
CO2: 29 mmol/L (ref 22–32)
Calcium: 8.8 mg/dL — ABNORMAL LOW (ref 8.9–10.3)
Chloride: 101 mmol/L (ref 98–111)
Creatinine: 0.57 mg/dL (ref 0.44–1.00)
GFR, Estimated: >60 mL/min (ref >60)
Glucose, Bld: 109 mg/dL — ABNORMAL HIGH (ref 70–99)
Potassium: 4.1 mmol/L (ref 3.5–5.1)
Sodium: 137 mmol/L (ref 135–145)
Total Bilirubin: 0.3 mg/dL (ref 0.3–1.2)
Total Protein: 6 g/dL — ABNORMAL LOW (ref 6.5–8.1)

## 2023-01-07 LAB — CBC WITH DIFFERENTIAL (CANCER CENTER ONLY)
Abs Immature Granulocytes: 0.86 10*3/uL — ABNORMAL HIGH (ref 0.00–0.07)
Basophils Absolute: 0.1 10*3/uL (ref 0.0–0.1)
Basophils Relative: 1 %
Eosinophils Absolute: 0.2 10*3/uL (ref 0.0–0.5)
Eosinophils Relative: 1 %
HCT: 32.8 % — ABNORMAL LOW (ref 36.0–46.0)
Hemoglobin: 10.9 g/dL — ABNORMAL LOW (ref 12.0–15.0)
Immature Granulocytes: 6 %
Lymphocytes Relative: 13 %
Lymphs Abs: 2 10*3/uL (ref 0.7–4.0)
MCH: 31.1 pg (ref 26.0–34.0)
MCHC: 33.2 g/dL (ref 30.0–36.0)
MCV: 93.4 fL (ref 80.0–100.0)
Monocytes Absolute: 1.5 10*3/uL — ABNORMAL HIGH (ref 0.1–1.0)
Monocytes Relative: 10 %
Neutro Abs: 10.9 10*3/uL — ABNORMAL HIGH (ref 1.7–7.7)
Neutrophils Relative %: 69 %
Platelet Count: 230 10*3/uL (ref 150–400)
RBC: 3.51 MIL/uL — ABNORMAL LOW (ref 3.87–5.11)
RDW: 21.3 % — ABNORMAL HIGH (ref 11.5–15.5)
WBC Count: 15.5 10*3/uL — ABNORMAL HIGH (ref 4.0–10.5)
nRBC: 0.2 % (ref 0.0–0.2)

## 2023-01-07 MED ORDER — HEPARIN SOD (PORK) LOCK FLUSH 100 UNIT/ML IV SOLN
500.0000 [IU] | Freq: Once | INTRAVENOUS | Status: AC | PRN
  Administered 2023-01-07: 500 [IU]

## 2023-01-07 MED ORDER — SODIUM CHLORIDE 0.9% FLUSH
10.0000 mL | INTRAVENOUS | Status: DC | PRN
  Administered 2023-01-07: 10 mL

## 2023-01-07 MED ORDER — SODIUM CHLORIDE 0.9 % IV SOLN: Freq: Once | INTRAVENOUS | Status: AC

## 2023-01-07 MED ORDER — SODIUM CHLORIDE 0.9% FLUSH
10.0000 mL | Freq: Once | INTRAVENOUS | Status: AC
  Administered 2023-01-07: 10 mL

## 2023-01-07 MED ORDER — SODIUM CHLORIDE 0.9 % IV SOLN
600.0000 mg/m2 | Freq: Once | INTRAVENOUS | Status: AC
  Administered 2023-01-07: 1217 mg via INTRAVENOUS
  Filled 2023-01-07: qty 32.01

## 2023-01-07 MED ORDER — PROCHLORPERAZINE MALEATE 10 MG PO TABS
10.0000 mg | ORAL_TABLET | Freq: Once | ORAL | Status: AC
  Administered 2023-01-07: 10 mg via ORAL
  Filled 2023-01-07: qty 1

## 2023-01-07 MED ORDER — SODIUM CHLORIDE 0.9 % IV SOLN: Freq: Once | INTRAVENOUS | Status: DC

## 2023-01-07 NOTE — Assessment & Plan Note (Signed)
Her imaging anemia has improved with recent changes to her treatment plan She does not need blood transfusion today

## 2023-01-07 NOTE — Progress Notes (Signed)
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[facility]) She tolerated last cycle of therapy well except for fatigue and fluid retention that has improved with furosemide We will continue treatment as scheduled  Anemia due to antineoplastic chemotherapy Her imaging anemia has improved with recent changes to her treatment plan She does not need blood transfusion today   Bilateral leg edema This has improved We discussed importance of dietary modification and third spacing as a cause of fluid retention She will use furosemide only if her weight is over 200 pounds  Orders Placed This Encounter  Procedures   CBC with Differential ([place] Only)    Standing Status:   Future    Standing Expiration Date:   02/05/2024   CMP ([place] only)    Standing Status:   Future    Standing Expiration Date:   02/05/2024   CBC with Differential ([place] Only)    Standing Status:   Future    Standing Expiration Date:   02/19/2024   CMP ([place] only)    Standing Status:   Future    Standing Expiration Date:   02/19/2024    All questions were answered. The patient knows to call the clinic with any problems, questions or concerns. The total time spent in the appointment was 20 minutes encounter with patients including review of chart and various tests results, discussions about plan of care and coordination of care plan   [name], MD 01/07/2023 10:27 AM  INTERVAL HISTORY: Please see below for problem oriented charting. she returns for treatment follow-up seen prior to chemotherapy She tolerated last cycle therapy well She has lost a lot of fluid weight with furosemide She denies worsening peripheral neuropathy  REVIEW OF SYSTEMS:   Constitutional:  Denies fevers, chills or abnormal weight loss Eyes: Denies blurriness of vision Ears, nose, mouth, throat, and face: Denies mucositis or sore throat Respiratory: Denies cough, dyspnea or wheezes Cardiovascular: Denies palpitation, chest discomfort or lower extremity swelling Gastrointestinal:  Denies nausea, heartburn or change in bowel habits Skin: Denies abnormal skin rashes Lymphatics: Denies new lymphadenopathy or easy bruising Neurological:Denies numbness, tingling or new weaknesses Behavioral/Psych: Mood is stable, no new changes  All other systems were reviewed with the patient and are negative.  I have reviewed the past medical history, past surgical history, social history and family history with the patient and they are unchanged from previous note.  ALLERGIES:  has No Known Allergies.  MEDICATIONS:  Current Outpatient Medications  Medication Sig Dispense Refill   acetaminophen (TYLENOL) 500 MG tablet Take 500 mg by mouth every 6 (six) hours as needed for mild pain, moderate pain or fever.     aspirin 81 MG EC tablet Take by mouth daily.     cetirizine (ZYRTEC) 10 MG tablet Take 10 mg by mouth daily.     Cholecalciferol (VITAMIN D3 PO) Take 1 tablet by mouth daily.     dexamethasone (DECADRON) 4 MG tablet Take 2 pills the day before chemo and daily for 2 days after chemo 30 tablet 0   furosemide (LASIX) 20 MG tablet Take 1 tablet (20 mg total) by mouth daily. 30 tablet 0   lidocaine-prilocaine (EMLA) cream Apply 1 Application topically as needed. 30 g 3   LORazepam (ATIVAN) 0.5 MG  tablet Take 1 tablet (0.5 mg total) by mouth 2 (two) times daily as needed for anxiety. 3 tablet 0   losartan-hydrochlorothiazide (HYZAAR) 100-25 MG tablet Take 0.5 tablets by mouth daily.     magic mouthwash (nystatin, lidocaine, diphenhydrAMINE, alum & mag hydroxide) suspension Swish and swallow 5 mLs by mouth 4 (four) times daily for 7 days 150 mL 0   omeprazole (PRILOSEC) 20 MG capsule Take 1  capsule (20 mg total) by mouth 2 (two) times daily. 28 capsule 0   ondansetron (ZOFRAN) 8 MG tablet Take 1 tablet (8 mg total) by mouth every 8 (eight) hours as needed for nausea. 30 tablet 3   No current facility-administered medications for this visit.   Facility-Administered Medications Ordered in Other Visits  Medication Dose Route Frequency Provider Last Rate Last Admin   0.9 %  sodium chloride infusion   Intravenous Once [name], Britley Gashi, MD       sodium chloride flush (NS) 0.9 % injection 10 mL  10 mL Intracatheter PRN [name], Rickey Sadowski, MD   10 mL at 01/07/23 1015    SUMMARY OF ONCOLOGIC HISTORY: Oncology History Overview Note  High grade and LMS, 50% ER/PR positive dMMR normal, PD-L1 CPS 3%   Uterine sarcoma ([place])  06/02/2021 Imaging   MRI pelvis Heterogeneous 9.0 cm intrauterine mass within the intramural/submucosal anterior fundus demonstrating interval growth, internal enhancement, and restricted diffusion 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 No extra uterine extension. No evidence of metastatic disease within the pelvis.   Mild thickening of the endometrial stripe, likely related to entrapment by the adjacent uterine mass   06/03/2021 Initial Diagnosis   Uterine sarcoma ([place])   06/03/2021 Cancer Staging   Staging form: Corpus Uteri - Sarcoma, AJCC 7th Edition - Clinical stage from 06/03/2021: FIGO Stage IVB (rT1b, N0, M1) - Signed by [name], MD on 03/31/2022 Diagnostic confirmation: Positive histology Stage prefix: Recurrence Biopsy of metastatic site performed: No Lymph-vascular invasion (LVI): LVI present/identified, NOS   06/03/2021 Pathology Results   FINAL MICROSCOPIC DIAGNOSIS:   A. UTERUS, CERVIX, BILATERAL FALLOPIAN TUBES AND OVARIES, HYSTERECTOMY:  - Uterus:       Mixed high grade uterine sarcoma, spanning 6 cm, see comment.       Extensive lymphovascular invasion.       See oncology table.  - Cervix: Benign squamous and  endocervical mucosa. No dysplasia or  malignancy.  - Bilateral ovaries: Unremarkable. No malignancy.  - Bilateral fallopian tubes: Unremarkable. No malignancy.   ONCOLOGY TABLE:   UTERUS, SARCOMA: Resection   Procedure: Total hysterectomy and bilateral salpingo-oophorectomy  Specimen Integrity: Focally disrupted on posterior surface  Tumor Site: Anterior wall  Tumor Size: 6 cm  Histologic Type: High grade sarcoma, mixed. See comment.  Other Tissue/ Organ Involvement: Not identified  Lymphovascular Invasion: Present, extensive.  Margins: All margins negative for tumor  Regional Lymph Nodes: Not applicable (no lymph nodes submitted or found)  Distant Metastasis:       Distant Site(s) Involved: Not applicable  Pathologic Stage Classification (pTNM, AJCC 8th Edition): pT1b, pN not  assigned  Ancillary Studies: Can be performed upon request  Representative Tumor Block: A6  Comment(s): The tumor has two morphologically different components. One component consists of malignant spindle cells which can be seen arising from the smooth muscle. These foci are positive for SMA, desmin, cyclinD1 (focal), and CD10 (patchy). The other component consists of round to slightly spindle cells with abundant admixed vessels and occasional large pleomorphic cells. These  areas are positive for SMA (patchy weak), desmin (focal), CD10 (variable with diffuse areas). Pancytokeratin is negative. 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   ADDENDUM:   PROGNOSTIC INDICATOR RESULTS:   Immunohistochemical and morphometric analysis performed manually    Estrogen Receptor:       POSITIVE, 50%, WEAK TO MODERATE STAINING  Progesterone Receptor:   POSITIVE, 50%, MODERATE TO STRONG STAINING   Reference Range Estrogen and Progesterone Receptor       Negative  0%       Positive  >1%    06/03/2021 Surgery   Surgeon: Donaciano Eva     Assistants: Dr Lahoma Crocker (an MD assistant was necessary for tissue manipulation, management of robotic instrumentation, retraction and positioning due to the complexity of the case and hospital policies).  Operation: Robotic-assisted laparoscopic total hysterectomy >250gm with bilateral salpingoophorectomy, minilaparotomy for specimen delivery   Surgeon: Donaciano Eva    Operative Findings:  : Bulky 16cm uterus with intra-uterine mass (consistent with a fibroid-like mass) , smooth normal appearing serosa, no suspicious bulky nodes. Normal ovaries bilaterally. Normal upper abdomen. Uterus too large to deliver vaginally in tact.    06/18/2021 Imaging   1. Multiple bilateral pulmonary nodules, measuring up to 9 mm. Most of these are perifissural and subpleural in location, likely lymph nodes. Nevertheless, close follow-up recommended to exclude metastatic disease. 2. 11 mm hypoattenuating lesion towards the dome of the liver, indeterminate. MRI abdomen with and without contrast could be used to further evaluate as clinically warranted. 3. Aortic Atherosclerosis (ICD10-I70.0).   10/13/2021 Imaging   1. No acute findings within the abdomen or pelvis. No specific findings identified to suggest residual or recurrence of tumor. 2. Stable small pulmonary nodules within the left lower lobe.   03/18/2022 Imaging   1. Interval development of 2 soft tissue nodules in the anterior pelvis measuring 10 mm and 7 mm respectively. Close follow-up recommended to exclude metastatic disease. PET-CT may be warranted to further evaluate. 2. Development of 2 mm left upper lobe parenchymal nodule, nonspecific. Close attention on follow-up imaging recommended  3. Stable appearance of index bilateral pulmonary nodules identified previously. 4. Aortic Atherosclerosis (ICD10-I70.0).   04/02/2022 PET scan   1. Signs of local tumor recurrence noted at the vaginal cuff. 2. 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 New since 10/13/2021 and progressive when compared with 03/17/2022. 3. New focal area of increased uptake within the right biceps femoris muscle which is suspicious for skeletal muscle involvement. 4. Stable appearance of small pulmonary nodules described on 03/17/2022. These are too small to characterize by PET-CT.   04/09/2022 Procedure   Successful placement of a right IJ approach Power Port with ultrasound and fluoroscopic guidance. The catheter is ready for use.   04/09/2022 Echocardiogram    1. Left ventricular ejection fraction, by estimation, is 65 to 70%. The left ventricle has normal function. The left ventricle has no regional wall motion abnormalities. There is mild concentric left ventricular hypertrophy. Left ventricular diastolic parameters are consistent with Grade I diastolic dysfunction (impaired relaxation).  2. Right ventricular systolic function is normal. The right ventricular size is normal.  3. The mitral valve is normal in structure. No evidence of mitral valve regurgitation.  4. The aortic valve is tricuspid. Aortic valve regurgitation is not visualized. No aortic stenosis is present.  5. The inferior vena cava is normal in size with greater than 50% respiratory  variability, suggesting right atrial pressure of 3 mmHg.   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 Status post hysterectomy and bilateral oophorectomy. Multiple newand enlarged peritoneal nodules throughout the low abdomen and pelvis, previously FDG avid and consistent with worsened locally recurrent and peritoneal metastatic disease. 2. Multiple small bilateral pulmonary nodules, several of which are slightly enlarged compared to prior examination, consistent with worsened pulmonary metastatic disease.   Aortic Atherosclerosis (ICD10-I70.0).     06/25/2022 -  07/06/2022 Chemotherapy   Patient is on Treatment Plan : UTERINE UNDIFFERENTIATED / LEIOMYOSARCOMA Gemcitabine D1,8 + Docetaxel D8 (900/100) q21d     06/25/2022 -  Chemotherapy   Patient is on Treatment Plan : UTERINE UNDIFFERENTIATED LEIOMYOSARCOMA Gemcitabine D1,8 + Docetaxel D8 (900/100) q21d     09/02/2022 Imaging   1. Moderate response to therapy of peritoneal metastasis within the anterior pelvis. 2. Minimal response to therapy of pulmonary metastasis. 3. No new or progressive disease. 4.  Aortic Atherosclerosis (ICD10-I70.0).     12/01/2022 Imaging   1. No new or progressive findings in the chest, abdomen, or pelvis. 2. Peritoneal metastases in the pelvis described previously measure smaller today compatible with interval response to therapy. 3. Stable tiny bilateral pulmonary nodules. Previously characterized as metastatic lesions, these are unchanged in the interval. No new suspicious pulmonary nodule or mass. 4.  Aortic Atherosclerois (ICD10-170.0)     PHYSICAL EXAMINATION: ECOG PERFORMANCE STATUS: 1 - Symptomatic but completely ambulatory  Vitals:   01/07/23 0814  BP: 115/70  Pulse: (!) 102  Resp: 18  Temp: (!) 97.4 F (36.3 C)  SpO2: 99%   Filed Weights   01/07/23 0809 01/07/23 0814  Weight: 194 lb (88 kg) 194 lb (88 kg)    GENERAL:alert, no distress and comfortable  NEURO: alert & oriented x 3 with fluent speech, no focal motor/sensory deficits  LABORATORY DATA:  I have reviewed the data as listed    Component Value Date/Time   NA 137 01/07/2023 0732   K 4.1 01/07/2023 0732   CL 101 01/07/2023 0732   CO2 29 01/07/2023 0732   GLUCOSE 109 (H) 01/07/2023 0732   BUN 12 01/07/2023 0732   CREATININE 0.57 01/07/2023 0732   CALCIUM 8.8 (L) 01/07/2023 0732   PROT 6.0 (L) 01/07/2023 0732   ALBUMIN 3.2 (L) 01/07/2023 0732   AST 17 01/07/2023 0732   ALT 9 01/07/2023 0732   ALKPHOS 94 01/07/2023 0732   BILITOT 0.3 01/07/2023 0732   GFRNONAA >60 01/07/2023 0732     No results found for: "SPEP", "UPEP"  Lab Results  Component Value Date   WBC 15.5 (H) 01/07/2023   NEUTROABS 10.9 (H) 01/07/2023   HGB 10.9 (L) 01/07/2023   HCT 32.8 (L) 01/07/2023   MCV 93.4 01/07/2023   PLT 230 01/07/2023      Chemistry      Component Value Date/Time   NA 137 01/07/2023 0732   K 4.1 01/07/2023 0732   CL 101 01/07/2023 0732   CO2 29 01/07/2023 0732   BUN 12 01/07/2023 0732   CREATININE 0.57 01/07/2023 0732      Component Value Date/Time   CALCIUM 8.8 (L) 01/07/2023 0732   ALKPHOS 94 01/07/2023 0732   AST 17 01/07/2023 0732   ALT 9 01/07/2023 0732   BILITOT 0.3 01/07/2023 0732

## 2023-01-07 NOTE — Assessment & Plan Note (Signed)
She tolerated last cycle of therapy well except for fatigue and fluid retention that has improved with furosemide We will continue treatment as scheduled

## 2023-01-07 NOTE — Assessment & Plan Note (Signed)
This has improved We discussed importance of dietary modification and third spacing as a cause of fluid retention She will use furosemide only if her weight is over 200 pounds

## 2023-01-07 NOTE — Patient Instructions (Signed)
[facility]  Discharge Instructions: Thank you for choosing [facility] to provide your oncology and hematology care.   If you have a lab appointment with the [facility], please go directly to the [facility] and check in at the registration area.   Wear comfortable clothing and clothing appropriate for easy access to any Portacath or PICC line.   We strive to give you quality time with your provider. You may need to reschedule your appointment if you arrive late (15 or more minutes).  Arriving late affects you and other patients whose appointments are after yours.  Also, if you miss three or more appointments without notifying the office, you may be dismissed from the clinic at the provider's discretion.      For prescription refill requests, have your pharmacy contact our office and allow 72 hours for refills to be completed.    Today you received the following chemotherapy and/or immunotherapy agents: Gemzar      To help prevent nausea and vomiting after your treatment, we encourage you to take your nausea medication as directed.  BELOW ARE SYMPTOMS THAT SHOULD BE REPORTED IMMEDIATELY: *FEVER GREATER THAN 100.4 F (38 C) OR HIGHER *CHILLS OR SWEATING *NAUSEA AND VOMITING THAT IS NOT CONTROLLED WITH YOUR NAUSEA MEDICATION *UNUSUAL SHORTNESS OF BREATH *UNUSUAL BRUISING OR BLEEDING *URINARY PROBLEMS (pain or burning when urinating, or frequent urination) *BOWEL PROBLEMS (unusual diarrhea, constipation, pain near the anus) TENDERNESS IN MOUTH AND THROAT WITH OR WITHOUT PRESENCE OF ULCERS (sore throat, sores in mouth, or a toothache) UNUSUAL RASH, SWELLING OR PAIN  UNUSUAL VAGINAL DISCHARGE OR ITCHING   Items with * indicate a potential emergency and should be followed up as soon as possible or go to the Emergency Department if any problems should occur.  Please show the CHEMOTHERAPY ALERT CARD or IMMUNOTHERAPY ALERT CARD at check-in  to the Emergency Department and triage nurse.  Should you have questions after your visit or need to cancel or reschedule your appointment, please contact [facility]  Dept: [phone]  and follow the prompts.  Office hours are 8:00 a.m. to 4:30 p.m. Monday - Friday. Please note that voicemails left after 4:00 p.m. may not be returned until the following business day.  We are closed weekends and major holidays. You have access to a nurse at all times for urgent questions. Please call the main number to the clinic Dept: [phone] and follow the prompts.   For any non-urgent questions, you may also contact your provider using MyChart. We now offer e-Visits for anyone 35 and older to request care online for non-urgent symptoms. For details visit [email or web address].   Also download the MyChart app! Go to the app store, search "MyChart", open the app, select [facility], and log in with your MyChart username and password.

## 2023-01-08 ENCOUNTER — Other Ambulatory Visit: Payer: Self-pay

## 2023-01-20 MED FILL — Dexamethasone Sodium Phosphate Inj 100 MG/10ML: INTRAMUSCULAR | Qty: 1 | Status: AC

## 2023-01-21 ENCOUNTER — Encounter: Payer: Self-pay | Admitting: Hematology and Oncology

## 2023-01-21 ENCOUNTER — Inpatient Hospital Stay: Payer: Managed Care, Other (non HMO) | Attending: Gynecologic Oncology | Admitting: Hematology and Oncology

## 2023-01-21 ENCOUNTER — Inpatient Hospital Stay: Payer: Managed Care, Other (non HMO)

## 2023-01-21 VITALS — BP 130/67 | HR 90 | Temp 98.1°F | Resp 16 | Wt 193.4 lb

## 2023-01-21 VITALS — BP 133/84 | HR 91 | Temp 98.2°F | Resp 16

## 2023-01-21 DIAGNOSIS — Z5111 Encounter for antineoplastic chemotherapy: Secondary | ICD-10-CM | POA: Insufficient documentation

## 2023-01-21 DIAGNOSIS — C549 Malignant neoplasm of corpus uteri, unspecified: Secondary | ICD-10-CM | POA: Diagnosis not present

## 2023-01-21 DIAGNOSIS — D6481 Anemia due to antineoplastic chemotherapy: Secondary | ICD-10-CM

## 2023-01-21 DIAGNOSIS — C55 Malignant neoplasm of uterus, part unspecified: Secondary | ICD-10-CM | POA: Diagnosis not present

## 2023-01-21 DIAGNOSIS — Z5189 Encounter for other specified aftercare: Secondary | ICD-10-CM | POA: Insufficient documentation

## 2023-01-21 DIAGNOSIS — T451X5A Adverse effect of antineoplastic and immunosuppressive drugs, initial encounter: Secondary | ICD-10-CM | POA: Diagnosis not present

## 2023-01-21 LAB — CBC WITH DIFFERENTIAL (CANCER CENTER ONLY)
Abs Immature Granulocytes: 0.02 10*3/uL (ref 0.00–0.07)
Basophils Absolute: 0.1 10*3/uL (ref 0.0–0.1)
Basophils Relative: 1 %
Eosinophils Absolute: 0.1 10*3/uL (ref 0.0–0.5)
Eosinophils Relative: 2 %
HCT: 30.3 % — ABNORMAL LOW (ref 36.0–46.0)
Hemoglobin: 10 g/dL — ABNORMAL LOW (ref 12.0–15.0)
Immature Granulocytes: 0 %
Lymphocytes Relative: 30 %
Lymphs Abs: 1.6 10*3/uL (ref 0.7–4.0)
MCH: 30.7 pg (ref 26.0–34.0)
MCHC: 33 g/dL (ref 30.0–36.0)
MCV: 92.9 fL (ref 80.0–100.0)
Monocytes Absolute: 1.3 10*3/uL — ABNORMAL HIGH (ref 0.1–1.0)
Monocytes Relative: 23 %
Neutro Abs: 2.4 10*3/uL (ref 1.7–7.7)
Neutrophils Relative %: 44 %
Platelet Count: 360 10*3/uL (ref 150–400)
RBC: 3.26 MIL/uL — ABNORMAL LOW (ref 3.87–5.11)
RDW: 21.3 % — ABNORMAL HIGH (ref 11.5–15.5)
WBC Count: 5.5 10*3/uL (ref 4.0–10.5)
nRBC: 0 % (ref 0.0–0.2)

## 2023-01-21 LAB — CMP (CANCER CENTER ONLY)
ALT: 11 U/L (ref 0–44)
AST: 18 U/L (ref 15–41)
Albumin: 3.3 g/dL — ABNORMAL LOW (ref 3.5–5.0)
Alkaline Phosphatase: 70 U/L (ref 38–126)
Anion gap: 8 (ref 5–15)
BUN: 12 mg/dL (ref 8–23)
CO2: 26 mmol/L (ref 22–32)
Calcium: 8.8 mg/dL — ABNORMAL LOW (ref 8.9–10.3)
Chloride: 105 mmol/L (ref 98–111)
Creatinine: 0.57 mg/dL (ref 0.44–1.00)
GFR, Estimated: >60 mL/min (ref >60)
Glucose, Bld: 103 mg/dL — ABNORMAL HIGH (ref 70–99)
Potassium: 3.7 mmol/L (ref 3.5–5.1)
Sodium: 139 mmol/L (ref 135–145)
Total Bilirubin: 0.3 mg/dL (ref 0.3–1.2)
Total Protein: 6.1 g/dL — ABNORMAL LOW (ref 6.5–8.1)

## 2023-01-21 MED ORDER — SODIUM CHLORIDE 0.9 % IV SOLN
60.0000 mg/m2 | Freq: Once | INTRAVENOUS | Status: AC
  Administered 2023-01-21: 123 mg via INTRAVENOUS
  Filled 2023-01-21: qty 12.3

## 2023-01-21 MED ORDER — PROCHLORPERAZINE MALEATE 10 MG PO TABS
10.0000 mg | ORAL_TABLET | Freq: Once | ORAL | Status: AC
  Administered 2023-01-21: 10 mg via ORAL
  Filled 2023-01-21: qty 1

## 2023-01-21 MED ORDER — SODIUM CHLORIDE 0.9 % IV SOLN
600.0000 mg/m2 | Freq: Once | INTRAVENOUS | Status: AC
  Administered 2023-01-21: 1216 mg via INTRAVENOUS
  Filled 2023-01-21: qty 31.98

## 2023-01-21 MED ORDER — SODIUM CHLORIDE 0.9% FLUSH
10.0000 mL | INTRAVENOUS | Status: DC | PRN
  Administered 2023-01-21: 10 mL

## 2023-01-21 MED ORDER — SODIUM CHLORIDE 0.9% FLUSH
10.0000 mL | Freq: Once | INTRAVENOUS | Status: AC
  Administered 2023-01-21: 10 mL

## 2023-01-21 MED ORDER — SODIUM CHLORIDE 0.9 % IV SOLN: Freq: Once | INTRAVENOUS | Status: AC

## 2023-01-21 MED ORDER — HEPARIN SOD (PORK) LOCK FLUSH 100 UNIT/ML IV SOLN
500.0000 [IU] | Freq: Once | INTRAVENOUS | Status: AC | PRN
  Administered 2023-01-21: 500 [IU]

## 2023-01-21 MED ORDER — SODIUM CHLORIDE 0.9 % IV SOLN
10.0000 mg | Freq: Once | INTRAVENOUS | Status: AC
  Administered 2023-01-21: 10 mg via INTRAVENOUS
  Filled 2023-01-21: qty 10

## 2023-01-21 MED ORDER — DEXAMETHASONE 4 MG PO TABS: ORAL_TABLET | ORAL | 1 refills | Status: DC

## 2023-01-21 NOTE — Assessment & Plan Note (Signed)
Her imaging anemia has improved with recent changes to her treatment plan She does not need blood transfusion today

## 2023-01-21 NOTE — Assessment & Plan Note (Signed)
She tolerated last cycle of therapy well except for mild fatigue and fluid retention that has improved with furosemide We will continue treatment as scheduled She is due for CT imaging in mid April

## 2023-01-21 NOTE — Progress Notes (Signed)
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   All questions were answered. The patient knows to call the clinic with any problems, questions or concerns. The total time spent in the appointment was 20 minutes encounter with patients including review of chart and various tests results, discussions about plan of care and coordination of care plan   [name], MD 01/21/2023 9:00 AM  INTERVAL HISTORY: Please see below for problem oriented charting. she returns for treatment follow-up She tolerated last cycle of treatment better No recent new peripheral neuropathy Denies nausea She has less fluid retention  REVIEW OF SYSTEMS:   Constitutional: Denies fevers, chills or abnormal weight loss Eyes: Denies blurriness of vision Ears, nose, mouth, throat, and face: Denies mucositis or sore throat Respiratory: Denies cough, dyspnea or wheezes Cardiovascular: Denies palpitation, chest discomfort or lower extremity swelling Gastrointestinal:  Denies nausea, heartburn or change in bowel habits Skin: Denies abnormal skin rashes Lymphatics: Denies new lymphadenopathy or easy bruising Neurological:Denies numbness, tingling or new weaknesses Behavioral/Psych: Mood is stable, no new changes  All other systems were reviewed with the patient and are  negative.  I have reviewed the past medical history, past surgical history, social history and family history with the patient and they are unchanged from previous note.  ALLERGIES:  has No Known Allergies.  MEDICATIONS:  Current Outpatient Medications  Medication Sig Dispense Refill   acetaminophen (TYLENOL) 500 MG tablet Take 500 mg by mouth every 6 (six) hours as needed for mild pain, moderate pain or fever.     aspirin 81 MG EC tablet Take by mouth daily.     cetirizine (ZYRTEC) 10 MG tablet Take 10 mg by mouth daily.     Cholecalciferol (VITAMIN D3 PO) Take 1 tablet by mouth daily.     dexamethasone (DECADRON) 4 MG tablet Take 2 pills the day before chemo and daily for 2 days after chemo 30 tablet 1   furosemide (LASIX) 20 MG tablet Take 1 tablet (20 mg total) by mouth daily. 30 tablet 0   lidocaine-prilocaine (EMLA) cream Apply 1 Application topically as needed. 30 g 3   LORazepam (ATIVAN) 0.5 MG tablet Take 1 tablet (0.5 mg total) by mouth 2 (two) times daily as needed for anxiety. 3 tablet 0   losartan-hydrochlorothiazide (HYZAAR) 100-25 MG tablet Take 0.5 tablets by mouth daily.     magic mouthwash (nystatin, lidocaine, diphenhydrAMINE, alum & mag hydroxide) suspension Swish and swallow 5 mLs by mouth 4 (four) times daily for 7 days 150 mL 0   omeprazole (PRILOSEC) 20 MG capsule Take 1 capsule (20 mg total) by mouth 2 (two) times daily. 28 capsule 0   ondansetron (ZOFRAN) 8 MG tablet Take 1 tablet (8 mg total) by mouth every 8 (eight) hours as needed for nausea. 30 tablet 3   No current facility-administered medications for this visit.  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 No extra uterine extension. No evidence of metastatic disease within the pelvis.   Mild thickening of the endometrial stripe, likely related to entrapment by the adjacent uterine mass   06/03/2021 Initial Diagnosis   Uterine sarcoma ([place])   06/03/2021 Cancer Staging   Staging form: Corpus Uteri - Sarcoma, AJCC 7th Edition - Clinical stage from 06/03/2021: FIGO Stage IVB (rT1b, N0, M1) - Signed by [name], MD on 03/31/2022 Diagnostic confirmation: Positive histology Stage prefix: Recurrence Biopsy of metastatic site performed: No Lymph-vascular invasion (LVI): LVI present/identified, NOS   06/03/2021 Pathology Results   FINAL MICROSCOPIC DIAGNOSIS:   A. UTERUS, CERVIX, BILATERAL FALLOPIAN TUBES AND OVARIES, HYSTERECTOMY:  - Uterus:       Mixed high grade uterine sarcoma, spanning 6 cm, see comment.       Extensive lymphovascular invasion.       See oncology table.  - Cervix: Benign squamous and endocervical mucosa. No dysplasia or  malignancy.  - Bilateral ovaries: Unremarkable. No malignancy.  - Bilateral fallopian tubes: Unremarkable. No malignancy.   ONCOLOGY TABLE:   UTERUS, SARCOMA: Resection   Procedure: Total hysterectomy and bilateral salpingo-oophorectomy  Specimen Integrity: Focally disrupted on posterior surface  Tumor Site: Anterior wall  Tumor Size: 6 cm  Histologic Type: High grade sarcoma, mixed. See comment.  Other Tissue/ Organ Involvement: Not identified  Lymphovascular Invasion: Present, extensive.  Margins: All margins negative for tumor  Regional Lymph Nodes: Not applicable (no lymph nodes submitted or found)  Distant Metastasis:       Distant Site(s) Involved: Not applicable  Pathologic Stage Classification (pTNM, AJCC 8th Edition): pT1b, pN not  assigned  Ancillary Studies: Can be performed upon request  Representative Tumor Block: A6  Comment(s): The tumor has  two morphologically different components. One component consists of malignant spindle cells which can be seen arising from the smooth muscle. These foci are positive for SMA, desmin, cyclinD1 (focal), and CD10 (patchy). The other component consists of round to slightly spindle cells with abundant admixed vessels and occasional large pleomorphic cells. These areas are positive for SMA (patchy weak), desmin (focal), CD10 (variable with diffuse areas). Pancytokeratin is negative. 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   ADDENDUM:   PROGNOSTIC INDICATOR RESULTS:   Immunohistochemical and morphometric analysis performed manually    Estrogen Receptor:       POSITIVE, 50%, WEAK TO MODERATE STAINING  Progesterone Receptor:   POSITIVE, 50%, MODERATE TO STRONG STAINING   Reference Range Estrogen and Progesterone Receptor       Negative  0%       Positive  >1%    06/03/2021 Surgery   Surgeon: Donaciano Eva    Assistants: Dr Lahoma Crocker (an MD assistant was necessary for tissue manipulation, management of robotic instrumentation, retraction and positioning due to the complexity of the case and hospital policies).  Operation: Robotic-assisted laparoscopic total hysterectomy >250gm with bilateral salpingoophorectomy, minilaparotomy for specimen delivery   Surgeon: Donaciano Eva    Operative Findings:  : Bulky 16cm uterus with intra-uterine mass (consistent with a fibroid-like mass) , smooth normal  appearing serosa, no suspicious bulky nodes. Normal ovaries bilaterally. Normal upper abdomen. Uterus too large to deliver vaginally in tact.    06/18/2021 Imaging   1. Multiple bilateral pulmonary nodules, measuring up to 9 mm. Most of these are perifissural and subpleural in location, likely lymph nodes. Nevertheless, close follow-up recommended to exclude metastatic disease. 2. 11 mm hypoattenuating lesion towards  the dome of the liver, indeterminate. MRI abdomen with and without contrast could be used to further evaluate as clinically warranted. 3. Aortic Atherosclerosis (ICD10-I70.0).   10/13/2021 Imaging   1. No acute findings within the abdomen or pelvis. No specific findings identified to suggest residual or recurrence of tumor. 2. Stable small pulmonary nodules within the left lower lobe.   03/18/2022 Imaging   1. Interval development of 2 soft tissue nodules in the anterior pelvis measuring 10 mm and 7 mm respectively. Close follow-up recommended to exclude metastatic disease. PET-CT may be warranted to further evaluate. 2. Development of 2 mm left upper lobe parenchymal nodule, nonspecific. Close attention on follow-up imaging recommended  3. Stable appearance of index bilateral pulmonary nodules identified previously. 4. Aortic Atherosclerosis (ICD10-I70.0).   04/02/2022 PET scan   1. Signs of local tumor recurrence noted at the vaginal cuff. 2. 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 New since 10/13/2021 and progressive when compared with 03/17/2022. 3. New focal area of increased uptake within the right biceps femoris muscle which is suspicious for skeletal muscle involvement. 4. Stable appearance of small pulmonary nodules described on 03/17/2022. These are too small to characterize by PET-CT.   04/09/2022 Procedure   Successful placement of a right IJ approach Power Port with ultrasound and fluoroscopic guidance. The catheter is ready for use.   04/09/2022 Echocardiogram    1. Left ventricular ejection fraction, by estimation, is 65 to 70%. The left ventricle has normal function. The left ventricle has no regional wall motion abnormalities. There is mild concentric left ventricular hypertrophy. Left ventricular diastolic parameters are consistent with Grade I diastolic dysfunction (impaired relaxation).  2. Right ventricular systolic function is  normal. The right ventricular size is normal.  3. The mitral valve is normal in structure. No evidence of mitral valve regurgitation.  4. The aortic valve is tricuspid. Aortic valve regurgitation is not visualized. No aortic stenosis is present.  5. The inferior vena cava is normal in size with greater than 50% respiratory variability, suggesting right atrial pressure of 3 mmHg.   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 Status post hysterectomy and bilateral oophorectomy. Multiple newand enlarged peritoneal nodules throughout the low abdomen and pelvis, previously FDG avid and consistent with worsened locally recurrent and peritoneal metastatic disease. 2. Multiple small bilateral pulmonary nodules, several of which are slightly enlarged compared to prior examination, consistent with worsened pulmonary metastatic disease.   Aortic Atherosclerosis (ICD10-I70.0).     06/25/2022 - 07/06/2022 Chemotherapy   Patient is on Treatment Plan : UTERINE UNDIFFERENTIATED / LEIOMYOSARCOMA Gemcitabine D1,8 + Docetaxel D8 (900/100) q21d     06/25/2022 -  Chemotherapy   Patient is on Treatment Plan : UTERINE UNDIFFERENTIATED LEIOMYOSARCOMA Gemcitabine D1,8 + Docetaxel D8 (900/100) q21d     09/02/2022 Imaging   1. Moderate response to therapy of peritoneal metastasis within the anterior pelvis. 2. Minimal response to therapy of pulmonary metastasis. 3. No new or progressive disease. 4.  Aortic Atherosclerosis (ICD10-I70.0).  12/01/2022 Imaging   1. No new or progressive findings in the chest, abdomen, or pelvis. 2. Peritoneal metastases in the pelvis described previously measure smaller today compatible with interval response to therapy. 3. Stable tiny bilateral pulmonary nodules. Previously characterized as metastatic lesions, these are unchanged in the interval. No new suspicious pulmonary nodule or  mass. 4.  Aortic Atherosclerois (ICD10-170.0)     PHYSICAL EXAMINATION: ECOG PERFORMANCE STATUS: 1 - Symptomatic but completely ambulatory  Vitals:   01/21/23 0817  BP: 130/67  Pulse: 90  Resp: 16  Temp: 98.1 F (36.7 C)  SpO2: 98%   Filed Weights   01/21/23 0817  Weight: 193 lb 6.4 oz (87.7 kg)    GENERAL:alert, no distress and comfortable  NEURO: alert & oriented x 3 with fluent speech, no focal motor/sensory deficits  LABORATORY DATA:  I have reviewed the data as listed    Component Value Date/Time   NA 137 01/07/2023 0732   K 4.1 01/07/2023 0732   CL 101 01/07/2023 0732   CO2 29 01/07/2023 0732   GLUCOSE 109 (H) 01/07/2023 0732   BUN 12 01/07/2023 0732   CREATININE 0.57 01/07/2023 0732   CALCIUM 8.8 (L) 01/07/2023 0732   PROT 6.0 (L) 01/07/2023 0732   ALBUMIN 3.2 (L) 01/07/2023 0732   AST 17 01/07/2023 0732   ALT 9 01/07/2023 0732   ALKPHOS 94 01/07/2023 0732   BILITOT 0.3 01/07/2023 0732   GFRNONAA >60 01/07/2023 0732    No results found for: "SPEP", "UPEP"  Lab Results  Component Value Date   WBC 5.5 01/21/2023   NEUTROABS 2.4 01/21/2023   HGB 10.0 (L) 01/21/2023   HCT 30.3 (L) 01/21/2023   MCV 92.9 01/21/2023   PLT 360 01/21/2023      Chemistry      Component Value Date/Time   NA 137 01/07/2023 0732   K 4.1 01/07/2023 0732   CL 101 01/07/2023 0732   CO2 29 01/07/2023 0732   BUN 12 01/07/2023 0732   CREATININE 0.57 01/07/2023 0732      Component Value Date/Time   CALCIUM 8.8 (L) 01/07/2023 0732   ALKPHOS 94 01/07/2023 0732   AST 17 01/07/2023 0732   ALT 9 01/07/2023 0732   BILITOT 0.3 01/07/2023 0732

## 2023-01-21 NOTE — Patient Instructions (Addendum)
[facility]  Discharge Instructions: Thank you for choosing [facility] to provide your oncology and hematology care.   If you have a lab appointment with the [facility], please go directly to the [facility] and check in at the registration area.   Wear comfortable clothing and clothing appropriate for easy access to any Portacath or PICC line.   We strive to give you quality time with your provider. You may need to reschedule your appointment if you arrive late (15 or more minutes).  Arriving late affects you and other patients whose appointments are after yours.  Also, if you miss three or more appointments without notifying the office, you may be dismissed from the clinic at the provider's discretion.      For prescription refill requests, have your pharmacy contact our office and allow 72 hours for refills to be completed.    Today you received the following chemotherapy and/or immunotherapy agents: Gemcitabine and Taxotere.   To help prevent nausea and vomiting after your treatment, we encourage you to take your nausea medication as directed.  BELOW ARE SYMPTOMS THAT SHOULD BE REPORTED IMMEDIATELY: *FEVER GREATER THAN 100.4 F (38 C) OR HIGHER *CHILLS OR SWEATING *NAUSEA AND VOMITING THAT IS NOT CONTROLLED WITH YOUR NAUSEA MEDICATION *UNUSUAL SHORTNESS OF BREATH *UNUSUAL BRUISING OR BLEEDING *URINARY PROBLEMS (pain or burning when urinating, or frequent urination) *BOWEL PROBLEMS (unusual diarrhea, constipation, pain near the anus) TENDERNESS IN MOUTH AND THROAT WITH OR WITHOUT PRESENCE OF ULCERS (sore throat, sores in mouth, or a toothache) UNUSUAL RASH, SWELLING OR PAIN  UNUSUAL VAGINAL DISCHARGE OR ITCHING   Items with * indicate a potential emergency and should be followed up as soon as possible or go to the Emergency Department if any problems should occur.  Please show the CHEMOTHERAPY ALERT CARD or IMMUNOTHERAPY ALERT  CARD at check-in to the Emergency Department and triage nurse.  Should you have questions after your visit or need to cancel or reschedule your appointment, please contact [facility]  Dept: [phone]  and follow the prompts.  Office hours are 8:00 a.m. to 4:30 p.m. Monday - Friday. Please note that voicemails left after 4:00 p.m. may not be returned until the following business day.  We are closed weekends and major holidays. You have access to a nurse at all times for urgent questions. Please call the main number to the clinic Dept: [phone] and follow the prompts.   For any non-urgent questions, you may also contact your provider using MyChart. We now offer e-Visits for anyone 60 and older to request care online for non-urgent symptoms. For details visit [email or web address].   Also download the MyChart app! Go to the app store, search "MyChart", open the app, select [facility], and log in with your MyChart username and password.  Gemcitabine Injection What is this medication? GEMCITABINE (jem SYE ta been) treats some types of cancer. It works by slowing down the growth of cancer cells. This medicine may be used for other purposes; ask your health care provider or pharmacist if you have questions. COMMON BRAND NAME(S): Gemzar, Infugem What should I tell my care team before I take this medication? They need to know if you have any of these conditions: Blood disorders Infection Kidney disease Liver disease Lung or breathing disease, such as asthma or COPD Recent or ongoing radiation therapy An unusual or allergic reaction to gemcitabine, other medications, foods, dyes, or preservatives  If you or your partner are pregnant or trying to get pregnant Breast-feeding How should I use this medication? This medication is injected into a vein. It is given by your care team in a hospital or clinic setting. Talk to your care team about the use of this  medication in children. Special care may be needed. Overdosage: If you think you have taken too much of this medicine contact a poison control center or emergency room at once. NOTE: This medicine is only for you. Do not share this medicine with others. What if I miss a dose? Keep appointments for follow-up doses. It is important not to miss your dose. Call your care team if you are unable to keep an appointment. What may interact with this medication? Interactions have not been studied. This list may not describe all possible interactions. Give your health care provider a list of all the medicines, herbs, non-prescription drugs, or dietary supplements you use. Also tell them if you smoke, drink alcohol, or use illegal drugs. Some items may interact with your medicine. What should I watch for while using this medication? Your condition will be monitored carefully while you are receiving this medication. This medication may make you feel generally unwell. This is not uncommon, as chemotherapy can affect healthy cells as well as cancer cells. Report any side effects. Continue your course of treatment even though you feel ill unless your care team tells you to stop. In some cases, you may be given additional medications to help with side effects. Follow all directions for their use. This medication may increase your risk of getting an infection. Call your care team for advice if you get a fever, chills, sore throat, or other symptoms of a cold or flu. Do not treat yourself. Try to avoid being around people who are sick. This medication may increase your risk to bruise or bleed. Call your care team if you notice any unusual bleeding. Be careful brushing or flossing your teeth or using a toothpick because you may get an infection or bleed more easily. If you have any dental work done, tell your dentist you are receiving this medication. Avoid taking medications that contain aspirin, acetaminophen,  ibuprofen, naproxen, or ketoprofen unless instructed by your care team. These medications may hide a fever. Talk to your care team if you or your partner wish to become pregnant or think you might be pregnant. This medication can cause serious birth defects if taken during pregnancy and for 6 months after the last dose. A negative pregnancy test is required before starting this medication. A reliable form of contraception is recommended while taking this medication and for 6 months after the last dose. Talk to your care team about effective forms of contraception. Do not father a child while taking this medication and for 3 months after the last dose. Use a condom while having sex during this time period. Do not breastfeed while taking this medication and for at least 1 week after the last dose. This medication may cause infertility. Talk to your care team if you are concerned about your fertility. What side effects may I notice from receiving this medication? Side effects that you should report to your care team as soon as possible: Allergic reactions--skin rash, itching, hives, swelling of the face, lips, tongue, or throat Capillary leak syndrome--stomach or muscle pain, unusual weakness or fatigue, feeling faint or lightheaded, decrease in the amount of urine, swelling of the ankles, hands, or feet, trouble breathing Infection--fever, chills,  cough, sore throat, wounds that don't heal, pain or trouble when passing urine, general feeling of discomfort or being unwell Liver injury--right upper belly pain, loss of appetite, nausea, light-colored stool, dark yellow or brown urine, yellowing skin or eyes, unusual weakness or fatigue Low red blood cell level--unusual weakness or fatigue, dizziness, headache, trouble breathing Lung injury--shortness of breath or trouble breathing, cough, spitting up blood, chest pain, fever Stomach pain, bloody diarrhea, pale skin, unusual weakness or fatigue, decrease in  the amount of urine, which may be signs of hemolytic uremic syndrome Sudden and severe headache, confusion, change in vision, seizures, which may be signs of posterior reversible encephalopathy syndrome (PRES) Unusual bruising or bleeding Side effects that usually do not require medical attention (report to your care team if they continue or are bothersome): Diarrhea Drowsiness Hair loss Nausea Pain, redness, or swelling with sores inside the mouth or throat Vomiting This list may not describe all possible side effects. Call your doctor for medical advice about side effects. You may report side effects to FDA at 1-800-FDA-1088. Where should I keep my medication? This medication is given in a hospital or clinic. It will not be stored at home. NOTE: This sheet is a summary. It may not cover all possible information. If you have questions about this medicine, talk to your doctor, pharmacist, or health care provider.  2023 Elsevier/Gold Standard (2022-03-10 00:00:00) Docetaxel Injection What is this medication? DOCETAXEL (doe se TAX el) treats some types of cancer. It works by slowing down the growth of cancer cells. This medicine may be used for other purposes; ask your health care provider or pharmacist if you have questions. COMMON BRAND NAME(S): Docefrez, Taxotere What should I tell my care team before I take this medication? They need to know if you have any of these conditions: Kidney disease Liver disease Low white blood cell levels Tingling of the fingers or toes or other nerve disorder An unusual or allergic reaction to docetaxel, polysorbate 80, other medications, foods, dyes, or preservatives Pregnant or trying to get pregnant Breast-feeding How should I use this medication? This medication is injected into a vein. It is given by your care team in a hospital or clinic setting. Talk to your care team about the use of this medication in children. Special care may be  needed. Overdosage: If you think you have taken too much of this medicine contact a poison control center or emergency room at once. NOTE: This medicine is only for you. Do not share this medicine with others. What if I miss a dose? Keep appointments for follow-up doses. It is important not to miss your dose. Call your care team if you are unable to keep an appointment. What may interact with this medication? Do not take this medication with any of the following: Live virus vaccines This medication may also interact with the following: Certain antibiotics, such as clarithromycin, telithromycin Certain antivirals for HIV or hepatitis Certain medications for fungal infections, such as itraconazole, ketoconazole, voriconazole Grapefruit juice Nefazodone Supplements, such as St. John's wort This list may not describe all possible interactions. Give your health care provider a list of all the medicines, herbs, non-prescription drugs, or dietary supplements you use. Also tell them if you smoke, drink alcohol, or use illegal drugs. Some items may interact with your medicine. What should I watch for while using this medication? This medication may make you feel generally unwell. This is not uncommon as chemotherapy can affect healthy cells as well as cancer  cells. Report any side effects. Continue your course of treatment even though you feel ill unless your care team tells you to stop. You may need blood work done while you are taking this medication. This medication can cause serious side effects and infusion reactions. To reduce the risk, your care team may give you other medications to take before receiving this one. Be sure to follow the directions from your care team. This medication may increase your risk of getting an infection. Call your care team for advice if you get a fever, chills, sore throat, or other symptoms of a cold or flu. Do not treat yourself. Try to avoid being around people who  are sick. Avoid taking medications that contain aspirin, acetaminophen, ibuprofen, naproxen, or ketoprofen unless instructed by your care team. These medications may hide a fever. Be careful brushing or flossing your teeth or using a toothpick because you may get an infection or bleed more easily. If you have any dental work done, tell your dentist you are receiving this medication. Some products may contain alcohol. Ask your care team if this medication contains alcohol. Be sure to tell all care teams you are taking this medicine. Certain medications, like metronidazole and disulfiram, can cause an unpleasant reaction when taken with alcohol. The reaction includes flushing, headache, nausea, vomiting, sweating, and increased thirst. The reaction can last from 30 minutes to several hours. This medication may affect your coordination, reaction time, or judgement. Do not drive or operate machinery until you know how this medication affects you. Sit up or stand slowly to reduce the risk of dizzy or fainting spells. Drinking alcohol with this medication can increase the risk of these side effects. Talk to your care team about your risk of cancer. You may be more at risk for certain types of cancer if you take this medication. Talk to your care team if you wish to become pregnant or think you might be pregnant. This medication can cause serious birth defects if taken during pregnancy or if you get pregnant within 2 months after stopping therapy. A negative pregnancy test is required before starting this medication. A reliable form of contraception is recommended while taking this medication and for 2 months after stopping it. Talk to your care team about reliable forms of contraception. Do not breast-feed while taking this medication and for 1 week after stopping therapy. Use a condom during sex and for 4 months after stopping therapy. Tell your care team right away if you think your partner might be pregnant.  This medication can cause serious birth defects. This medication may cause infertility. Talk to your care team if you are concerned about your fertility. What side effects may I notice from receiving this medication? Side effects that you should report to your care team as soon as possible: Allergic reactions--skin rash, itching, hives, swelling of the face, lips, tongue, or throat Change in vision such as blurry vision, seeing halos around lights, vision loss Infection--fever, chills, cough, or sore throat Infusion reactions--chest pain, shortness of breath or trouble breathing, feeling faint or lightheaded Low red blood cell level--unusual weakness or fatigue, dizziness, headache, trouble breathing Pain, tingling, or numbness in the hands or feet Painful swelling, warmth, or redness of the skin, blisters or sores at the infusion site Redness, blistering, peeling, or loosening of the skin, including inside the mouth Sudden or severe stomach pain, bloody diarrhea, fever, nausea, vomiting Swelling of the ankles, hands, or feet Tumor lysis syndrome (TLS)--nausea, vomiting, diarrhea, decrease  in the amount of urine, dark urine, unusual weakness or fatigue, confusion, muscle pain or cramps, fast or irregular heartbeat, joint pain Unusual bruising or bleeding Side effects that usually do not require medical attention (report to your care team if they continue or are bothersome): Change in nail shape, thickness, or color Change in taste Hair loss Increased tears This list may not describe all possible side effects. Call your doctor for medical advice about side effects. You may report side effects to FDA at 1-800-FDA-1088. Where should I keep my medication? This medication is given in a hospital or clinic. It will not be stored at home. NOTE: This sheet is a summary. It may not cover all possible information. If you have questions about this medicine, talk to your doctor, pharmacist, or health care  provider.  2023 Elsevier/Gold Standard (2007-12-24 00:00:00)

## 2023-01-22 ENCOUNTER — Telehealth: Payer: Self-pay | Admitting: Family Medicine

## 2023-01-22 NOTE — Telephone Encounter (Signed)
Per 3/8 IB reached out to patient to answer questions regarding appointment.

## 2023-01-23 ENCOUNTER — Inpatient Hospital Stay: Payer: Managed Care, Other (non HMO)

## 2023-01-23 VITALS — BP 139/76 | HR 101 | Temp 97.7°F | Resp 16

## 2023-01-23 DIAGNOSIS — C549 Malignant neoplasm of corpus uteri, unspecified: Secondary | ICD-10-CM

## 2023-01-23 DIAGNOSIS — Z5111 Encounter for antineoplastic chemotherapy: Secondary | ICD-10-CM | POA: Diagnosis not present

## 2023-01-23 MED ORDER — PEGFILGRASTIM INJECTION 6 MG/0.6ML ~~LOC~~
6.0000 mg | PREFILLED_SYRINGE | Freq: Once | SUBCUTANEOUS | Status: AC
  Administered 2023-01-23: 6 mg via SUBCUTANEOUS
  Filled 2023-01-23: qty 0.6

## 2023-02-04 ENCOUNTER — Inpatient Hospital Stay: Payer: Managed Care, Other (non HMO) | Admitting: Hematology and Oncology

## 2023-02-04 ENCOUNTER — Inpatient Hospital Stay: Payer: Managed Care, Other (non HMO)

## 2023-02-04 ENCOUNTER — Encounter: Payer: Self-pay | Admitting: Hematology and Oncology

## 2023-02-04 VITALS — BP 132/71 | HR 101 | Resp 16

## 2023-02-04 VITALS — BP 135/77 | HR 114 | Temp 98.1°F | Resp 18 | Ht 67.5 in | Wt 189.6 lb

## 2023-02-04 DIAGNOSIS — D6481 Anemia due to antineoplastic chemotherapy: Secondary | ICD-10-CM | POA: Diagnosis not present

## 2023-02-04 DIAGNOSIS — C549 Malignant neoplasm of corpus uteri, unspecified: Secondary | ICD-10-CM

## 2023-02-04 DIAGNOSIS — T451X5A Adverse effect of antineoplastic and immunosuppressive drugs, initial encounter: Secondary | ICD-10-CM

## 2023-02-04 DIAGNOSIS — Z5111 Encounter for antineoplastic chemotherapy: Secondary | ICD-10-CM | POA: Diagnosis not present

## 2023-02-04 LAB — URINALYSIS, COMPLETE (UACMP) WITH MICROSCOPIC
Bilirubin Urine: NEGATIVE
Glucose, UA: NEGATIVE mg/dL
Ketones, ur: NEGATIVE mg/dL
Nitrite: NEGATIVE
Protein, ur: 30 mg/dL — AB
Specific Gravity, Urine: 1.01 (ref 1.005–1.030)
WBC, UA: >50 WBC/hpf (ref 0–5)
pH: 7 (ref 5.0–8.0)

## 2023-02-04 LAB — CMP (CANCER CENTER ONLY)
ALT: 9 U/L (ref 0–44)
AST: 16 U/L (ref 15–41)
Albumin: 3.2 g/dL — ABNORMAL LOW (ref 3.5–5.0)
Alkaline Phosphatase: 119 U/L (ref 38–126)
Anion gap: 6 (ref 5–15)
BUN: 10 mg/dL (ref 8–23)
CO2: 27 mmol/L (ref 22–32)
Calcium: 8.7 mg/dL — ABNORMAL LOW (ref 8.9–10.3)
Chloride: 104 mmol/L (ref 98–111)
Creatinine: 0.7 mg/dL (ref 0.44–1.00)
GFR, Estimated: >60 mL/min (ref >60)
Glucose, Bld: 107 mg/dL — ABNORMAL HIGH (ref 70–99)
Potassium: 3.8 mmol/L (ref 3.5–5.1)
Sodium: 137 mmol/L (ref 135–145)
Total Bilirubin: 0.3 mg/dL (ref 0.3–1.2)
Total Protein: 5.8 g/dL — ABNORMAL LOW (ref 6.5–8.1)

## 2023-02-04 LAB — CBC WITH DIFFERENTIAL (CANCER CENTER ONLY)
Abs Immature Granulocytes: 1.7 10*3/uL — ABNORMAL HIGH (ref 0.00–0.07)
Basophils Absolute: 0.2 10*3/uL — ABNORMAL HIGH (ref 0.0–0.1)
Basophils Relative: 1 %
Eosinophils Absolute: 0.1 10*3/uL (ref 0.0–0.5)
Eosinophils Relative: 1 %
HCT: 33 % — ABNORMAL LOW (ref 36.0–46.0)
Hemoglobin: 10.8 g/dL — ABNORMAL LOW (ref 12.0–15.0)
Immature Granulocytes: 8 %
Lymphocytes Relative: 9 %
Lymphs Abs: 1.9 10*3/uL (ref 0.7–4.0)
MCH: 30.3 pg (ref 26.0–34.0)
MCHC: 32.7 g/dL (ref 30.0–36.0)
MCV: 92.7 fL (ref 80.0–100.0)
Monocytes Absolute: 1.7 10*3/uL — ABNORMAL HIGH (ref 0.1–1.0)
Monocytes Relative: 8 %
Neutro Abs: 15.5 10*3/uL — ABNORMAL HIGH (ref 1.7–7.7)
Neutrophils Relative %: 73 %
Platelet Count: 177 10*3/uL (ref 150–400)
RBC: 3.56 MIL/uL — ABNORMAL LOW (ref 3.87–5.11)
RDW: 22.1 % — ABNORMAL HIGH (ref 11.5–15.5)
WBC Count: 21.2 10*3/uL — ABNORMAL HIGH (ref 4.0–10.5)
nRBC: 0.1 % (ref 0.0–0.2)

## 2023-02-04 MED ORDER — LORAZEPAM 0.5 MG PO TABS: 0.5000 mg | ORAL_TABLET | Freq: Two times a day (BID) | ORAL | 0 refills | Status: DC | PRN

## 2023-02-04 MED ORDER — SODIUM CHLORIDE 0.9% FLUSH
10.0000 mL | Freq: Once | INTRAVENOUS | Status: AC
  Administered 2023-02-04: 10 mL

## 2023-02-04 MED ORDER — SODIUM CHLORIDE 0.9 % IV SOLN
600.0000 mg/m2 | Freq: Once | INTRAVENOUS | Status: AC
  Administered 2023-02-04: 1216 mg via INTRAVENOUS
  Filled 2023-02-04: qty 31.98

## 2023-02-04 MED ORDER — HEPARIN SOD (PORK) LOCK FLUSH 100 UNIT/ML IV SOLN
500.0000 [IU] | Freq: Once | INTRAVENOUS | Status: AC | PRN
  Administered 2023-02-04: 500 [IU]

## 2023-02-04 MED ORDER — SODIUM CHLORIDE 0.9 % IV SOLN: Freq: Once | INTRAVENOUS | Status: AC

## 2023-02-04 MED ORDER — SODIUM CHLORIDE 0.9% FLUSH
10.0000 mL | INTRAVENOUS | Status: DC | PRN
  Administered 2023-02-04: 10 mL

## 2023-02-04 MED ORDER — PROCHLORPERAZINE MALEATE 10 MG PO TABS
10.0000 mg | ORAL_TABLET | Freq: Once | ORAL | Status: AC
  Administered 2023-02-04: 10 mg via ORAL
  Filled 2023-02-04: qty 1

## 2023-02-04 NOTE — Patient Instructions (Signed)
[facility]  Discharge Instructions: Thank you for choosing [facility] to provide your oncology and hematology care.   If you have a lab appointment with the Cancer Center, please go directly to the Cancer Center and check in at the registration area.   Wear comfortable clothing and clothing appropriate for easy access to any Portacath or PICC line.   We strive to give you quality time with your provider. You may need to reschedule your appointment if you arrive late (15 or more minutes).  Arriving late affects you and other patients whose appointments are after yours.  Also, if you miss three or more appointments without notifying the office, you may be dismissed from the clinic at the provider's discretion.      For prescription refill requests, have your pharmacy contact our office and allow 72 hours for refills to be completed.    Today you received the following chemotherapy and/or immunotherapy agents: Gemcitabine.       To help prevent nausea and vomiting after your treatment, we encourage you to take your nausea medication as directed.  BELOW ARE SYMPTOMS THAT SHOULD BE REPORTED IMMEDIATELY: *FEVER GREATER THAN 100.4 F (38 C) OR HIGHER *CHILLS OR SWEATING *NAUSEA AND VOMITING THAT IS NOT CONTROLLED WITH YOUR NAUSEA MEDICATION *UNUSUAL SHORTNESS OF BREATH *UNUSUAL BRUISING OR BLEEDING *URINARY PROBLEMS (pain or burning when urinating, or frequent urination) *BOWEL PROBLEMS (unusual diarrhea, constipation, pain near the anus) TENDERNESS IN MOUTH AND THROAT WITH OR WITHOUT PRESENCE OF ULCERS (sore throat, sores in mouth, or a toothache) UNUSUAL RASH, SWELLING OR PAIN  UNUSUAL VAGINAL DISCHARGE OR ITCHING   Items with * indicate a potential emergency and should be followed up as soon as possible or go to the Emergency Department if any problems should occur.  Please show the CHEMOTHERAPY ALERT CARD or IMMUNOTHERAPY ALERT CARD at  check-in to the Emergency Department and triage nurse.  Should you have questions after your visit or need to cancel or reschedule your appointment, please contact [facility]  Dept: [phone]  and follow the prompts.  Office hours are 8:00 a.m. to 4:30 p.m. Monday - Friday. Please note that voicemails left after 4:00 p.m. may not be returned until the following business day.  We are closed weekends and major holidays. You have access to a nurse at all times for urgent questions. Please call the main number to the clinic Dept: [phone] and follow the prompts.   For any non-urgent questions, you may also contact your provider using MyChart. We now offer e-Visits for anyone 18 and older to request care online for non-urgent symptoms. For details visit [email or web address].   Also download the MyChart app! Go to the app store, search "MyChart", open the app, select , and log in with your MyChart username and password.  Masks are optional in the cancer centers. If you would like for your care team to wear a mask while they are taking care of you, please let them know. You may have one support person who is at least 64 years old accompany you for your appointments. 

## 2023-02-04 NOTE — Assessment & Plan Note (Signed)
Her imaging anemia has improved with recent changes to her treatment plan We will monitor closely

## 2023-02-04 NOTE — Assessment & Plan Note (Signed)
She tolerated last cycle of therapy well except for mild fatigue  We will continue treatment as scheduled She is due for CT imaging in mid April

## 2023-02-04 NOTE — Progress Notes (Signed)
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    Answer:   [facility]    Order Specific Question:   Radiology Contrast Protocol - do NOT remove file path    Answer:   [email or web address]   CBC with Differential (Cancer Center Only)    Standing Status:   Future    Standing Expiration Date:   03/03/2024   CMP ([place] only)    Standing Status:   Future    Standing Expiration Date:   03/03/2024   CBC with Differential ([place] Only)    Standing Status:   Future    Standing Expiration Date:   03/18/2024   CMP ([place] only)    Standing Status:   Future    Standing Expiration Date:   03/18/2024   Urinalysis, Complete w Microscopic    Standing Status:   Future    Number of Occurrences:   1    Standing Expiration Date:   02/04/2024    All questions were answered. The patient knows to call the clinic with any problems, questions or concerns. The total time spent in the appointment was 25 minutes encounter with patients including review of chart and various tests results, discussions about plan  of care and coordination of care plan   [name], MD 02/04/2023 10:55 AM  INTERVAL HISTORY: Please see below for problem oriented charting. she returns for treatment follow-up seen prior to chemotherapy She tolerated recent treatment well She denies peripheral neuropathy She has some mild tingling affecting her fingers No recent fluid retention  REVIEW OF SYSTEMS:   Constitutional: Denies fevers, chills or abnormal weight loss Eyes: Denies blurriness of vision Ears, nose, mouth, throat, and face: Denies mucositis or sore throat Respiratory: Denies cough, dyspnea or wheezes Cardiovascular: Denies palpitation, chest discomfort or lower extremity swelling Gastrointestinal:  Denies nausea, heartburn or change in bowel habits Skin: Denies abnormal skin rashes Behavioral/Psych: Mood is stable, no new changes  All other systems were reviewed with the patient and are negative.  I have reviewed the past medical history, past surgical history, social history and family history with the patient and they are unchanged from previous note.  ALLERGIES:  has No Known Allergies.  MEDICATIONS:  Current Outpatient Medications  Medication Sig Dispense Refill   acetaminophen (TYLENOL) 500 MG tablet Take 500 mg by mouth every 6 (six) hours as needed for mild pain, moderate pain or fever.     aspirin 81 MG EC tablet Take by mouth daily.     cetirizine (ZYRTEC) 10 MG tablet Take 10  mg by mouth daily.     Cholecalciferol (VITAMIN D3 PO) Take 1 tablet by mouth daily.     dexamethasone (DECADRON) 4 MG tablet Take 2 pills the day before chemo and daily for 2 days after chemo 30 tablet 1   furosemide (LASIX) 20 MG tablet Take 1 tablet (20 mg total) by mouth daily. 30 tablet 0   lidocaine-prilocaine (EMLA) cream Apply 1 Application topically as needed. 30 g 3   LORazepam (ATIVAN) 0.5 MG tablet Take 1 tablet (0.5 mg total) by mouth 2 (two) times daily as needed for anxiety. 10 tablet 0    losartan-hydrochlorothiazide (HYZAAR) 100-25 MG tablet Take 0.5 tablets by mouth daily.     magic mouthwash (nystatin, lidocaine, diphenhydrAMINE, alum & mag hydroxide) suspension Swish and swallow 5 mLs by mouth 4 (four) times daily for 7 days 150 mL 0   omeprazole (PRILOSEC) 20 MG capsule Take 1 capsule (20 mg total) by mouth 2 (two) times daily. 28 capsule 0   ondansetron (ZOFRAN) 8 MG tablet Take 1 tablet (8 mg total) by mouth every 8 (eight) hours as needed for nausea. 30 tablet 3   No current facility-administered medications for this visit.   Facility-Administered Medications Ordered in Other Visits  Medication Dose Route Frequency Provider Last Rate Last Admin   gemcitabine (GEMZAR) 1,216 mg in sodium chloride 0.9 % 250 mL chemo infusion  600 mg/m2 (Treatment Plan Recorded) Intravenous Once [name], MD 188 mL/hr at 02/04/23 1001 1,216 mg at 02/04/23 1001    SUMMARY OF ONCOLOGIC HISTORY: Oncology History Overview Note  High grade and LMS, 50% ER/PR positive dMMR normal, PD-L1 CPS 3%   Uterine sarcoma ([place])  06/02/2021 Imaging   MRI pelvis Heterogeneous 9.0 cm intrauterine mass within the intramural/submucosal anterior fundus demonstrating interval growth, internal enhancement, and restricted diffusion 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 No extra uterine extension. No evidence of metastatic disease within the pelvis.   Mild thickening of the endometrial stripe, likely related to entrapment by the adjacent uterine mass   06/03/2021 Initial Diagnosis   Uterine sarcoma ([place])   06/03/2021 Cancer Staging   Staging form: Corpus Uteri - Sarcoma, AJCC 7th Edition - Clinical stage from 06/03/2021: FIGO Stage IVB (rT1b, N0, M1) - Signed by [name], MD on 03/31/2022 Diagnostic confirmation: Positive histology Stage prefix: Recurrence Biopsy of metastatic site performed: No Lymph-vascular invasion (LVI): LVI present/identified, NOS   06/03/2021  Pathology Results   FINAL MICROSCOPIC DIAGNOSIS:   A. UTERUS, CERVIX, BILATERAL FALLOPIAN TUBES AND OVARIES, HYSTERECTOMY:  - Uterus:       Mixed high grade uterine sarcoma, spanning 6 cm, see comment.       Extensive lymphovascular invasion.       See oncology table.  - Cervix: Benign squamous and endocervical mucosa. No dysplasia or  malignancy.  - Bilateral ovaries: Unremarkable. No malignancy.  - Bilateral fallopian tubes: Unremarkable. No malignancy.   ONCOLOGY TABLE:   UTERUS, SARCOMA: Resection   Procedure: Total hysterectomy and bilateral salpingo-oophorectomy  Specimen Integrity: Focally disrupted on posterior surface  Tumor Site: Anterior wall  Tumor Size: 6 cm  Histologic Type: High grade sarcoma, mixed. See comment.  Other Tissue/ Organ Involvement: Not identified  Lymphovascular Invasion: Present, extensive.  Margins: All margins negative for tumor  Regional Lymph Nodes: Not applicable (no lymph nodes submitted or found)  Distant Metastasis:       Distant Site(s) Involved: Not applicable  Pathologic Stage Classification (pTNM, AJCC 8th Edition): pT1b,  pN not  assigned  Ancillary Studies: Can be performed upon request  Representative Tumor Block: A6  Comment(s): The tumor has two morphologically different components. One component consists of malignant spindle cells which can be seen arising from the smooth muscle. These foci are positive for SMA, desmin, cyclinD1 (focal), and CD10 (patchy). The other component consists of round to slightly spindle cells with abundant admixed vessels and occasional large pleomorphic cells. These areas are positive for SMA (patchy weak), desmin (focal), CD10 (variable with diffuse areas). Pancytokeratin is negative. 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   ADDENDUM:   PROGNOSTIC INDICATOR RESULTS:   Immunohistochemical and morphometric analysis performed  manually    Estrogen Receptor:       POSITIVE, 50%, WEAK TO MODERATE STAINING  Progesterone Receptor:   POSITIVE, 50%, MODERATE TO STRONG STAINING   Reference Range Estrogen and Progesterone Receptor       Negative  0%       Positive  >1%    06/03/2021 Surgery   Surgeon: Donaciano Eva    Assistants: Dr Lahoma Crocker (an MD assistant was necessary for tissue manipulation, management of robotic instrumentation, retraction and positioning due to the complexity of the case and hospital policies).  Operation: Robotic-assisted laparoscopic total hysterectomy >250gm with bilateral salpingoophorectomy, minilaparotomy for specimen delivery   Surgeon: Donaciano Eva    Operative Findings:  : Bulky 16cm uterus with intra-uterine mass (consistent with a fibroid-like mass) , smooth normal appearing serosa, no suspicious bulky nodes. Normal ovaries bilaterally. Normal upper abdomen. Uterus too large to deliver vaginally in tact.    06/18/2021 Imaging   1. Multiple bilateral pulmonary nodules, measuring up to 9 mm. Most of these are perifissural and subpleural in location, likely lymph nodes. Nevertheless, close follow-up recommended to exclude metastatic disease. 2. 11 mm hypoattenuating lesion towards the dome of the liver, indeterminate. MRI abdomen with and without contrast could be used to further evaluate as clinically warranted. 3. Aortic Atherosclerosis (ICD10-I70.0).   10/13/2021 Imaging   1. No acute findings within the abdomen or pelvis. No specific findings identified to suggest residual or recurrence of tumor. 2. Stable small pulmonary nodules within the left lower lobe.   03/18/2022 Imaging   1. Interval development of 2 soft tissue nodules in the anterior pelvis measuring 10 mm and 7 mm respectively. Close follow-up recommended to exclude metastatic disease. PET-CT may be warranted to further evaluate. 2. Development of 2 mm left upper lobe parenchymal nodule, nonspecific.  Close attention on follow-up imaging recommended  3. Stable appearance of index bilateral pulmonary nodules identified previously. 4. Aortic Atherosclerosis (ICD10-I70.0).   04/02/2022 PET scan   1. Signs of local tumor recurrence noted at the vaginal cuff. 2. 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 New since 10/13/2021 and progressive when compared with 03/17/2022. 3. New focal area of increased uptake within the right biceps femoris muscle which is suspicious for skeletal muscle involvement. 4. Stable appearance of small pulmonary nodules described on 03/17/2022. These are too small to characterize by PET-CT.   04/09/2022 Procedure   Successful placement of a right IJ approach Power Port with ultrasound and fluoroscopic guidance. The catheter is ready for use.   04/09/2022 Echocardiogram    1. Left ventricular ejection fraction, by estimation, is 65 to 70%. The left ventricle has normal function. The left ventricle has no regional wall motion abnormalities. There is mild concentric left ventricular hypertrophy. Left  ventricular diastolic parameters are consistent with Grade I diastolic dysfunction (impaired relaxation).  2. Right ventricular systolic function is normal. The right ventricular size is normal.  3. The mitral valve is normal in structure. No evidence of mitral valve regurgitation.  4. The aortic valve is tricuspid. Aortic valve regurgitation is not visualized. No aortic stenosis is present.  5. The inferior vena cava is normal in size with greater than 50% respiratory variability, suggesting right atrial pressure of 3 mmHg.   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 Status post hysterectomy and bilateral oophorectomy. Multiple newand enlarged peritoneal nodules throughout the low abdomen and pelvis, previously FDG avid and consistent  with worsened locally recurrent and peritoneal metastatic disease. 2. Multiple small bilateral pulmonary nodules, several of which are slightly enlarged compared to prior examination, consistent with worsened pulmonary metastatic disease.   Aortic Atherosclerosis (ICD10-I70.0).     06/25/2022 - 07/06/2022 Chemotherapy   Patient is on Treatment Plan : UTERINE UNDIFFERENTIATED / LEIOMYOSARCOMA Gemcitabine D1,8 + Docetaxel D8 (900/100) q21d     06/25/2022 -  Chemotherapy   Patient is on Treatment Plan : UTERINE UNDIFFERENTIATED LEIOMYOSARCOMA Gemcitabine D1,8 + Docetaxel D8 (900/100) q21d     09/02/2022 Imaging   1. Moderate response to therapy of peritoneal metastasis within the anterior pelvis. 2. Minimal response to therapy of pulmonary metastasis. 3. No new or progressive disease. 4.  Aortic Atherosclerosis (ICD10-I70.0).     12/01/2022 Imaging   1. No new or progressive findings in the chest, abdomen, or pelvis. 2. Peritoneal metastases in the pelvis described previously measure smaller today compatible with interval response to therapy. 3. Stable tiny bilateral pulmonary nodules. Previously characterized as metastatic lesions, these are unchanged in the interval. No new suspicious pulmonary nodule or mass. 4.  Aortic Atherosclerois (ICD10-170.0)     PHYSICAL EXAMINATION: ECOG PERFORMANCE STATUS: 1 - Symptomatic but completely ambulatory  Vitals:   02/04/23 0816  BP: 135/77  Pulse: (!) 114  Resp: 18  Temp: 98.1 F (36.7 C)  SpO2: 99%   Filed Weights   02/04/23 0816  Weight: 189 lb 9.6 oz (86 kg)    GENERAL:alert, no distress and comfortable  NEURO: alert & oriented x 3 with fluent speech, no focal motor/sensory deficits  LABORATORY DATA:  I have reviewed the data as listed    Component Value Date/Time   NA 137 02/04/2023 0759   K 3.8 02/04/2023 0759   CL 104 02/04/2023 0759   CO2 27 02/04/2023 0759   GLUCOSE 107 (H) 02/04/2023 0759   BUN 10 02/04/2023 0759    CREATININE 0.70 02/04/2023 0759   CALCIUM 8.7 (L) 02/04/2023 0759   PROT 5.8 (L) 02/04/2023 0759   ALBUMIN 3.2 (L) 02/04/2023 0759   AST 16 02/04/2023 0759   ALT 9 02/04/2023 0759   ALKPHOS 119 02/04/2023 0759   BILITOT 0.3 02/04/2023 0759   GFRNONAA >60 02/04/2023 0759    No results found for: "SPEP", "UPEP"  Lab Results  Component Value Date   WBC 21.2 (H) 02/04/2023   NEUTROABS 15.5 (H) 02/04/2023   HGB 10.8 (L) 02/04/2023   HCT 33.0 (L) 02/04/2023   MCV 92.7 02/04/2023   PLT 177 02/04/2023      Chemistry      Component Value Date/Time   NA 137 02/04/2023 0759   K 3.8 02/04/2023 0759   CL 104 02/04/2023 0759  CO2 27 02/04/2023 0759   BUN 10 02/04/2023 0759   CREATININE 0.70 02/04/2023 0759      Component Value Date/Time   CALCIUM 8.7 (L) 02/04/2023 0759   ALKPHOS 119 02/04/2023 0759   AST 16 02/04/2023 0759   ALT 9 02/04/2023 0759   BILITOT 0.3 02/04/2023 0759

## 2023-02-05 ENCOUNTER — Telehealth: Payer: Self-pay | Admitting: Oncology

## 2023-02-05 LAB — URINE CULTURE: Culture: <10000 — AB

## 2023-02-05 NOTE — Telephone Encounter (Signed)
Called Leah Diaz and let her know that the urine culture was negative.  She verbalized understanding and agreement.

## 2023-02-08 ENCOUNTER — Encounter: Payer: Self-pay | Admitting: Hematology and Oncology

## 2023-02-16 ENCOUNTER — Other Ambulatory Visit: Payer: Self-pay

## 2023-02-17 ENCOUNTER — Telehealth: Payer: Self-pay

## 2023-02-17 MED FILL — Dexamethasone Sodium Phosphate Inj 100 MG/10ML: INTRAMUSCULAR | Qty: 1 | Status: AC

## 2023-02-17 NOTE — Telephone Encounter (Signed)
Pt called and states she saw PCP and had UA/UC performed and it showed hematuria. Pt reports intermittent 4/10 lower back pain, but otherwise no urine discomfort. She states she was prescribed Amoxicillin and is awaiting culture results. Pt is asking if it is OK to proceed with treatment as scheduled 4/4. Advised pt I would consult with MD and would contact her once I receive response.

## 2023-02-17 NOTE — Telephone Encounter (Signed)
If she feels better on amoxicillin I am ok for her to come in for treatment

## 2023-02-17 NOTE — Telephone Encounter (Signed)
Pt states she is feeling fine, just a little tired. She will come in for tx as scheduled.

## 2023-02-18 ENCOUNTER — Inpatient Hospital Stay: Payer: Managed Care, Other (non HMO)

## 2023-02-18 ENCOUNTER — Inpatient Hospital Stay: Payer: Managed Care, Other (non HMO) | Attending: Gynecologic Oncology

## 2023-02-18 ENCOUNTER — Ambulatory Visit: Payer: Managed Care, Other (non HMO) | Admitting: Hematology and Oncology

## 2023-02-18 VITALS — BP 133/81 | HR 90 | Temp 97.9°F | Resp 17 | Wt 190.9 lb

## 2023-02-18 DIAGNOSIS — C549 Malignant neoplasm of corpus uteri, unspecified: Secondary | ICD-10-CM

## 2023-02-18 DIAGNOSIS — Z5189 Encounter for other specified aftercare: Secondary | ICD-10-CM | POA: Insufficient documentation

## 2023-02-18 DIAGNOSIS — D6481 Anemia due to antineoplastic chemotherapy: Secondary | ICD-10-CM | POA: Diagnosis not present

## 2023-02-18 DIAGNOSIS — Z5111 Encounter for antineoplastic chemotherapy: Secondary | ICD-10-CM | POA: Insufficient documentation

## 2023-02-18 LAB — CMP (CANCER CENTER ONLY)
ALT: 11 U/L (ref 0–44)
AST: 17 U/L (ref 15–41)
Albumin: 3.2 g/dL — ABNORMAL LOW (ref 3.5–5.0)
Alkaline Phosphatase: 74 U/L (ref 38–126)
Anion gap: 8 (ref 5–15)
BUN: 14 mg/dL (ref 8–23)
CO2: 26 mmol/L (ref 22–32)
Calcium: 9.4 mg/dL (ref 8.9–10.3)
Chloride: 103 mmol/L (ref 98–111)
Creatinine: 0.76 mg/dL (ref 0.44–1.00)
GFR, Estimated: >60 mL/min (ref >60)
Glucose, Bld: 98 mg/dL (ref 70–99)
Potassium: 3.8 mmol/L (ref 3.5–5.1)
Sodium: 137 mmol/L (ref 135–145)
Total Bilirubin: 0.3 mg/dL (ref 0.3–1.2)
Total Protein: 5.9 g/dL — ABNORMAL LOW (ref 6.5–8.1)

## 2023-02-18 LAB — CBC WITH DIFFERENTIAL (CANCER CENTER ONLY)
Abs Immature Granulocytes: 0.03 10*3/uL (ref 0.00–0.07)
Basophils Absolute: 0.1 10*3/uL (ref 0.0–0.1)
Basophils Relative: 1 %
Eosinophils Absolute: 0.2 10*3/uL (ref 0.0–0.5)
Eosinophils Relative: 4 %
HCT: 28.1 % — ABNORMAL LOW (ref 36.0–46.0)
Hemoglobin: 9.6 g/dL — ABNORMAL LOW (ref 12.0–15.0)
Immature Granulocytes: 1 %
Lymphocytes Relative: 26 %
Lymphs Abs: 1.5 10*3/uL (ref 0.7–4.0)
MCH: 31.1 pg (ref 26.0–34.0)
MCHC: 34.2 g/dL (ref 30.0–36.0)
MCV: 90.9 fL (ref 80.0–100.0)
Monocytes Absolute: 1.1 10*3/uL — ABNORMAL HIGH (ref 0.1–1.0)
Monocytes Relative: 20 %
Neutro Abs: 2.7 10*3/uL (ref 1.7–7.7)
Neutrophils Relative %: 48 %
Platelet Count: 465 10*3/uL — ABNORMAL HIGH (ref 150–400)
RBC: 3.09 MIL/uL — ABNORMAL LOW (ref 3.87–5.11)
RDW: 22.6 % — ABNORMAL HIGH (ref 11.5–15.5)
WBC Count: 5.6 10*3/uL (ref 4.0–10.5)
nRBC: 0 % (ref 0.0–0.2)

## 2023-02-18 MED ORDER — SODIUM CHLORIDE 0.9 % IV SOLN
10.0000 mg | Freq: Once | INTRAVENOUS | Status: AC
  Administered 2023-02-18: 10 mg via INTRAVENOUS
  Filled 2023-02-18: qty 10

## 2023-02-18 MED ORDER — SODIUM CHLORIDE 0.9 % IV SOLN: Freq: Once | INTRAVENOUS | Status: AC

## 2023-02-18 MED ORDER — HEPARIN SOD (PORK) LOCK FLUSH 100 UNIT/ML IV SOLN
500.0000 [IU] | Freq: Once | INTRAVENOUS | Status: AC | PRN
  Administered 2023-02-18: 500 [IU]

## 2023-02-18 MED ORDER — SODIUM CHLORIDE 0.9 % IV SOLN
600.0000 mg/m2 | Freq: Once | INTRAVENOUS | Status: AC
  Administered 2023-02-18: 1216 mg via INTRAVENOUS
  Filled 2023-02-18: qty 31.98

## 2023-02-18 MED ORDER — SODIUM CHLORIDE 0.9 % IV SOLN
60.0000 mg/m2 | Freq: Once | INTRAVENOUS | Status: AC
  Administered 2023-02-18: 121 mg via INTRAVENOUS
  Filled 2023-02-18: qty 12.1

## 2023-02-18 MED ORDER — SODIUM CHLORIDE 0.9% FLUSH
10.0000 mL | Freq: Once | INTRAVENOUS | Status: AC
  Administered 2023-02-18: 10 mL

## 2023-02-18 MED ORDER — SODIUM CHLORIDE 0.9% FLUSH
10.0000 mL | INTRAVENOUS | Status: DC | PRN
  Administered 2023-02-18: 10 mL

## 2023-02-18 MED ORDER — SODIUM CHLORIDE 0.9 % IV SOLN: Freq: Once | INTRAVENOUS | Status: DC

## 2023-02-18 MED ORDER — PROCHLORPERAZINE MALEATE 10 MG PO TABS
10.0000 mg | ORAL_TABLET | Freq: Once | ORAL | Status: AC
  Administered 2023-02-18: 10 mg via ORAL
  Filled 2023-02-18: qty 1

## 2023-02-18 NOTE — Patient Instructions (Signed)
[facility]  Discharge Instructions: Thank you for choosing [facility] to provide your oncology and hematology care.   If you have a lab appointment with the Cancer Center, please go directly to the Cancer Center and check in at the registration area.   Wear comfortable clothing and clothing appropriate for easy access to any Portacath or PICC line.   We strive to give you quality time with your provider. You may need to reschedule your appointment if you arrive late (15 or more minutes).  Arriving late affects you and other patients whose appointments are after yours.  Also, if you miss three or more appointments without notifying the office, you may be dismissed from the clinic at the provider's discretion.      For prescription refill requests, have your pharmacy contact our office and allow 72 hours for refills to be completed.    Today you received the following chemotherapy and/or immunotherapy agents Gemzar/Taxotere.      To help prevent nausea and vomiting after your treatment, we encourage you to take your nausea medication as directed.  BELOW ARE SYMPTOMS THAT SHOULD BE REPORTED IMMEDIATELY: *FEVER GREATER THAN 100.4 F (38 C) OR HIGHER *CHILLS OR SWEATING *NAUSEA AND VOMITING THAT IS NOT CONTROLLED WITH YOUR NAUSEA MEDICATION *UNUSUAL SHORTNESS OF BREATH *UNUSUAL BRUISING OR BLEEDING *URINARY PROBLEMS (pain or burning when urinating, or frequent urination) *BOWEL PROBLEMS (unusual diarrhea, constipation, pain near the anus) TENDERNESS IN MOUTH AND THROAT WITH OR WITHOUT PRESENCE OF ULCERS (sore throat, sores in mouth, or a toothache) UNUSUAL RASH, SWELLING OR PAIN  UNUSUAL VAGINAL DISCHARGE OR ITCHING   Items with * indicate a potential emergency and should be followed up as soon as possible or go to the Emergency Department if any problems should occur.  Please show the CHEMOTHERAPY ALERT CARD or IMMUNOTHERAPY ALERT CARD at  check-in to the Emergency Department and triage nurse.  Should you have questions after your visit or need to cancel or reschedule your appointment, please contact [facility]  Dept: [phone]  and follow the prompts.  Office hours are 8:00 a.m. to 4:30 p.m. Monday - Friday. Please note that voicemails left after 4:00 p.m. may not be returned until the following business day.  We are closed weekends and major holidays. You have access to a nurse at all times for urgent questions. Please call the main number to the clinic Dept: [phone] and follow the prompts.   For any non-urgent questions, you may also contact your provider using MyChart. We now offer e-Visits for anyone 18 and older to request care online for non-urgent symptoms. For details visit [email or web address].   Also download the MyChart app! Go to the app store, search "MyChart", open the app, select [place], and log in with your MyChart username and password.   

## 2023-02-20 ENCOUNTER — Inpatient Hospital Stay: Payer: Managed Care, Other (non HMO)

## 2023-02-20 ENCOUNTER — Other Ambulatory Visit: Payer: Self-pay

## 2023-02-20 VITALS — BP 117/60 | HR 102 | Temp 97.8°F | Resp 17 | Ht 67.5 in

## 2023-02-20 DIAGNOSIS — Z5111 Encounter for antineoplastic chemotherapy: Secondary | ICD-10-CM | POA: Diagnosis not present

## 2023-02-20 DIAGNOSIS — C549 Malignant neoplasm of corpus uteri, unspecified: Secondary | ICD-10-CM

## 2023-02-20 MED ORDER — PEGFILGRASTIM INJECTION 6 MG/0.6ML ~~LOC~~
6.0000 mg | PREFILLED_SYRINGE | Freq: Once | SUBCUTANEOUS | Status: AC
  Administered 2023-02-20: 6 mg via SUBCUTANEOUS

## 2023-02-20 NOTE — Patient Instructions (Signed)

## 2023-02-22 ENCOUNTER — Telehealth: Payer: Self-pay

## 2023-02-22 NOTE — Telephone Encounter (Signed)
Called and given below message that it is ok to take Keflex. She verbalized understanding.

## 2023-02-22 NOTE — Telephone Encounter (Signed)
She called and left a message she started on Amoxicillin last week from PCP for UTI.  PCP wants to switch to Keflex after getting lab results.  She is asking if ok to take Keflex?

## 2023-02-22 NOTE — Telephone Encounter (Signed)
yes

## 2023-02-26 ENCOUNTER — Telehealth: Payer: Self-pay

## 2023-02-26 NOTE — Telephone Encounter (Signed)
Called and told her yes she can take Diflucan. She verbalized understanding.

## 2023-02-26 NOTE — Telephone Encounter (Signed)
She called and left a message. She is taking Keflex for UTI from PCP. She has yeast infection now and was given Diflucan 150 mg tablet.  She is asking if it is okay to take Diflucan?

## 2023-02-26 NOTE — Telephone Encounter (Signed)
yes

## 2023-03-02 ENCOUNTER — Inpatient Hospital Stay: Payer: Managed Care, Other (non HMO)

## 2023-03-02 ENCOUNTER — Ambulatory Visit (HOSPITAL_COMMUNITY)
Admission: RE | Admit: 2023-03-02 | Discharge: 2023-03-02 | Disposition: A | Discharge status: Home / Self Care | Payer: Managed Care, Other (non HMO) | Source: Ambulatory Visit | Attending: Hematology and Oncology | Admitting: Hematology and Oncology

## 2023-03-02 ENCOUNTER — Telehealth: Payer: Self-pay

## 2023-03-02 DIAGNOSIS — C549 Malignant neoplasm of corpus uteri, unspecified: Secondary | ICD-10-CM

## 2023-03-02 DIAGNOSIS — Z5111 Encounter for antineoplastic chemotherapy: Secondary | ICD-10-CM | POA: Diagnosis not present

## 2023-03-02 LAB — CBC WITH DIFFERENTIAL (CANCER CENTER ONLY)
Abs Immature Granulocytes: 7.98 10*3/uL — ABNORMAL HIGH (ref 0.00–0.07)
Basophils Absolute: 0.1 10*3/uL (ref 0.0–0.1)
Basophils Relative: 0 %
Eosinophils Absolute: 0.1 10*3/uL (ref 0.0–0.5)
Eosinophils Relative: 0 %
HCT: 33.5 % — ABNORMAL LOW (ref 36.0–46.0)
Hemoglobin: 11.2 g/dL — ABNORMAL LOW (ref 12.0–15.0)
Immature Granulocytes: 15 %
Lymphocytes Relative: 5 %
Lymphs Abs: 2.5 10*3/uL (ref 0.7–4.0)
MCH: 30.9 pg (ref 26.0–34.0)
MCHC: 33.4 g/dL (ref 30.0–36.0)
MCV: 92.3 fL (ref 80.0–100.0)
Monocytes Absolute: 3.4 10*3/uL — ABNORMAL HIGH (ref 0.1–1.0)
Monocytes Relative: 7 %
Neutro Abs: 38.2 10*3/uL — ABNORMAL HIGH (ref 1.7–7.7)
Neutrophils Relative %: 73 %
Platelet Count: 177 10*3/uL (ref 150–400)
RBC: 3.63 MIL/uL — ABNORMAL LOW (ref 3.87–5.11)
RDW: 23.8 % — ABNORMAL HIGH (ref 11.5–15.5)
Smear Review: NORMAL
WBC Count: 52.3 10*3/uL (ref 4.0–10.5)
nRBC: 0.2 % (ref 0.0–0.2)

## 2023-03-02 LAB — CMP (CANCER CENTER ONLY)
ALT: 9 U/L (ref 0–44)
AST: 21 U/L (ref 15–41)
Albumin: 3.3 g/dL — ABNORMAL LOW (ref 3.5–5.0)
Alkaline Phosphatase: 156 U/L — ABNORMAL HIGH (ref 38–126)
Anion gap: 6 (ref 5–15)
BUN: 11 mg/dL (ref 8–23)
CO2: 28 mmol/L (ref 22–32)
Calcium: 9 mg/dL (ref 8.9–10.3)
Chloride: 102 mmol/L (ref 98–111)
Creatinine: 0.84 mg/dL (ref 0.44–1.00)
GFR, Estimated: >60 mL/min (ref >60)
Glucose, Bld: 98 mg/dL (ref 70–99)
Potassium: 4.2 mmol/L (ref 3.5–5.1)
Sodium: 136 mmol/L (ref 135–145)
Total Bilirubin: 0.4 mg/dL (ref 0.3–1.2)
Total Protein: 6.1 g/dL — ABNORMAL LOW (ref 6.5–8.1)

## 2023-03-02 LAB — SAMPLE TO BLOOD BANK

## 2023-03-02 MED ORDER — IOHEXOL 300 MG/ML  SOLN
100.0000 mL | Freq: Once | INTRAMUSCULAR | Status: AC | PRN
  Administered 2023-03-02: 100 mL via INTRAVENOUS

## 2023-03-02 MED ORDER — SODIUM CHLORIDE 0.9% FLUSH
10.0000 mL | Freq: Once | INTRAVENOUS | Status: AC
  Administered 2023-03-02: 10 mL

## 2023-03-02 MED ORDER — HEPARIN SOD (PORK) LOCK FLUSH 100 UNIT/ML IV SOLN
INTRAVENOUS | Status: AC
  Filled 2023-03-02: qty 5

## 2023-03-02 MED ORDER — SODIUM CHLORIDE (PF) 0.9 % IJ SOLN
INTRAMUSCULAR | Status: AC
  Filled 2023-03-02: qty 50

## 2023-03-02 NOTE — Telephone Encounter (Signed)
CRITICAL VALUE STICKER  CRITICAL VALUE: WBC: 52.3  RECEIVER (on-site recipient of call): Jarome Lamas, RN   DATE & TIME NOTIFIED: 03/02/23 @ 8:42  MESSENGER (representative from lab): Selena Batten  MD NOTIFIED: Dr. Bertis Ruddy, MD  TIME OF NOTIFICATION: 03/02/23 @ 8:46  RESPONSE:  MD aware. Per MD, result most likely d/t recent GSF injection.

## 2023-03-04 ENCOUNTER — Inpatient Hospital Stay: Payer: Managed Care, Other (non HMO)

## 2023-03-04 ENCOUNTER — Inpatient Hospital Stay: Payer: Managed Care, Other (non HMO) | Admitting: Hematology and Oncology

## 2023-03-04 ENCOUNTER — Encounter: Payer: Self-pay | Admitting: Hematology and Oncology

## 2023-03-04 ENCOUNTER — Other Ambulatory Visit: Payer: Self-pay | Admitting: Hematology and Oncology

## 2023-03-04 VITALS — BP 143/68 | HR 124 | Temp 97.6°F | Resp 17 | Wt 184.4 lb

## 2023-03-04 DIAGNOSIS — C549 Malignant neoplasm of corpus uteri, unspecified: Secondary | ICD-10-CM | POA: Diagnosis not present

## 2023-03-04 DIAGNOSIS — E6609 Other obesity due to excess calories: Secondary | ICD-10-CM | POA: Diagnosis not present

## 2023-03-04 DIAGNOSIS — Z6834 Body mass index (BMI) 34.0-34.9, adult: Secondary | ICD-10-CM

## 2023-03-04 DIAGNOSIS — T451X5A Adverse effect of antineoplastic and immunosuppressive drugs, initial encounter: Secondary | ICD-10-CM

## 2023-03-04 DIAGNOSIS — I10 Essential (primary) hypertension: Secondary | ICD-10-CM | POA: Diagnosis not present

## 2023-03-04 DIAGNOSIS — D6481 Anemia due to antineoplastic chemotherapy: Secondary | ICD-10-CM | POA: Diagnosis not present

## 2023-03-04 DIAGNOSIS — Z5111 Encounter for antineoplastic chemotherapy: Secondary | ICD-10-CM | POA: Diagnosis not present

## 2023-03-04 MED ORDER — SODIUM CHLORIDE 0.9% FLUSH
10.0000 mL | INTRAVENOUS | Status: DC | PRN
  Administered 2023-03-04: 10 mL

## 2023-03-04 MED ORDER — LIDOCAINE-PRILOCAINE 2.5-2.5 % EX CREA: 1.0000 | TOPICAL_CREAM | CUTANEOUS | 3 refills | Status: DC | PRN

## 2023-03-04 MED ORDER — HEPARIN SOD (PORK) LOCK FLUSH 100 UNIT/ML IV SOLN
500.0000 [IU] | Freq: Once | INTRAVENOUS | Status: AC | PRN
  Administered 2023-03-04: 500 [IU]

## 2023-03-04 MED ORDER — LOSARTAN POTASSIUM-HCTZ 100-25 MG PO TABS: 1.0000 | ORAL_TABLET | Freq: Every day | ORAL | Status: DC

## 2023-03-04 MED ORDER — SODIUM CHLORIDE 0.9 % IV SOLN
600.0000 mg/m2 | Freq: Once | INTRAVENOUS | Status: AC
  Administered 2023-03-04: 1216 mg via INTRAVENOUS
  Filled 2023-03-04: qty 31.98

## 2023-03-04 MED ORDER — SODIUM CHLORIDE 0.9 % IV SOLN: Freq: Once | INTRAVENOUS | Status: DC

## 2023-03-04 MED ORDER — PROCHLORPERAZINE MALEATE 10 MG PO TABS
10.0000 mg | ORAL_TABLET | Freq: Once | ORAL | Status: AC
  Administered 2023-03-04: 10 mg via ORAL
  Filled 2023-03-04: qty 1

## 2023-03-04 MED ORDER — SODIUM CHLORIDE 0.9 % IV SOLN: Freq: Once | INTRAVENOUS | Status: AC

## 2023-03-04 NOTE — Assessment & Plan Note (Signed)
Her imaging anemia has improved with recent changes to her treatment plan We will monitor closely 

## 2023-03-04 NOTE — Assessment & Plan Note (Signed)
Blood pressure is borderline normal The patient tends to retain a lot of fluids I recommend she resume full dose of Hyzaar and to take furosemide as needed I suspect a good baseline weight would be around 180 pounds for her

## 2023-03-04 NOTE — Assessment & Plan Note (Signed)
Suspect this is the cause of her splenomegaly We discussed importance of weight loss through dietary changes and lifestyle changes

## 2023-03-04 NOTE — Patient Instructions (Signed)
[facility]  Discharge Instructions: Thank you for choosing [facility] to provide your oncology and hematology care.   If you have a lab appointment with the Cancer Center, please go directly to the Cancer Center and check in at the registration area.   Wear comfortable clothing and clothing appropriate for easy access to any Portacath or PICC line.   We strive to give you quality time with your provider. You may need to reschedule your appointment if you arrive late (15 or more minutes).  Arriving late affects you and other patients whose appointments are after yours.  Also, if you miss three or more appointments without notifying the office, you may be dismissed from the clinic at the provider's discretion.      For prescription refill requests, have your pharmacy contact our office and allow 72 hours for refills to be completed.    Today you received the following chemotherapy and/or immunotherapy agents: Gemcitabine      To help prevent nausea and vomiting after your treatment, we encourage you to take your nausea medication as directed.  BELOW ARE SYMPTOMS THAT SHOULD BE REPORTED IMMEDIATELY: *FEVER GREATER THAN 100.4 F (38 C) OR HIGHER *CHILLS OR SWEATING *NAUSEA AND VOMITING THAT IS NOT CONTROLLED WITH YOUR NAUSEA MEDICATION *UNUSUAL SHORTNESS OF BREATH *UNUSUAL BRUISING OR BLEEDING *URINARY PROBLEMS (pain or burning when urinating, or frequent urination) *BOWEL PROBLEMS (unusual diarrhea, constipation, pain near the anus) TENDERNESS IN MOUTH AND THROAT WITH OR WITHOUT PRESENCE OF ULCERS (sore throat, sores in mouth, or a toothache) UNUSUAL RASH, SWELLING OR PAIN  UNUSUAL VAGINAL DISCHARGE OR ITCHING   Items with * indicate a potential emergency and should be followed up as soon as possible or go to the Emergency Department if any problems should occur.  Please show the CHEMOTHERAPY ALERT CARD or IMMUNOTHERAPY ALERT CARD at  check-in to the Emergency Department and triage nurse.  Should you have questions after your visit or need to cancel or reschedule your appointment, please contact [facility]  Dept: [phone]  and follow the prompts.  Office hours are 8:00 a.m. to 4:30 p.m. Monday - Friday. Please note that voicemails left after 4:00 p.m. may not be returned until the following business day.  We are closed weekends and major holidays. You have access to a nurse at all times for urgent questions. Please call the main number to the clinic Dept: [phone] and follow the prompts.   For any non-urgent questions, you may also contact your provider using MyChart. We now offer e-Visits for anyone 18 and older to request care online for non-urgent symptoms. For details visit [email or web address].   Also download the MyChart app! Go to the app store, search "MyChart", open the app, select [place], and log in with your MyChart username and password. 

## 2023-03-04 NOTE — Progress Notes (Signed)
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[facility]) I have reviewed multiple imaging studies with the patient and her husband Overall, all measurable disease are about the same I believe her response has nadired We discussed the risk, benefits, side effects of continuing treatment with chemotherapy with reduced dose of omission of Taxotere versus trial of immunotherapy versus antiestrogen therapy At the end of the day, she is undecided We will proceed with gemcitabine as scheduled today and she will call me next week with her final decision I plan to repeat imaging study in 3 months, again in July of this year  Essential hypertension Blood pressure is borderline normal The patient tends to retain a lot of fluids I recommend she resume full dose of Hyzaar and to take furosemide as needed I suspect a good baseline weight would be around 180 pounds for her  Anemia due to antineoplastic chemotherapy Her imaging anemia has improved with recent changes to her treatment plan We will monitor closely  Class 1 obesity due to excess calories with body mass index (BMI) of 34.0 to 34.9 in adult Suspect this is the cause of her splenomegaly We discussed importance of weight loss through dietary changes and lifestyle changes  No orders of the defined types were placed in this encounter.   All questions were answered. The patient knows to call the clinic with any problems, questions or concerns. The total time spent in the appointment was 40 minutes encounter with patients including review of chart and various tests results, discussions about plan of care and coordination of care plan   Artis Delay, MD 03/04/2023 9:44 AM  INTERVAL HISTORY: Please see below for problem  oriented charting. she returns for treatment follow-up and review test results We spent majority of our time reviewing CT imaging and discussed different treatment options She continues to have good diuresis with reduced leg swelling She had mild intermittent constipation  REVIEW OF SYSTEMS:   Constitutional: Denies fevers, chills or abnormal weight loss Eyes: Denies blurriness of vision Ears, nose, mouth, throat, and face: Denies mucositis or sore throat Respiratory: Denies cough, dyspnea or wheezes Cardiovascular: Denies palpitation, chest discomfort  Skin: Denies abnormal skin rashes Lymphatics: Denies new lymphadenopathy or easy bruising Neurological:Denies numbness, tingling or new weaknesses Behavioral/Psych: Mood is stable, no new changes  All other systems were reviewed with the patient and are negative.  I have reviewed the past medical history, past surgical history, social history and family history with the patient and they are unchanged from previous note.  ALLERGIES:  has No Known Allergies.  MEDICATIONS:  Current Outpatient Medications  Medication Sig Dispense Refill   acetaminophen (TYLENOL) 500 MG tablet Take 500 mg by mouth every 6 (six) hours as needed for mild pain, moderate pain or fever.     aspirin 81 MG EC tablet Take by mouth daily.     cetirizine (ZYRTEC) 10 MG tablet Take 10 mg by mouth daily.     Cholecalciferol (VITAMIN D3 PO) Take 1 tablet by mouth daily.     dexamethasone (DECADRON) 4 MG tablet Take 2 pills the day before chemo and daily for 2 days after chemo 30 tablet 1   furosemide (LASIX) 20 MG tablet Take 1 tablet (20 mg total) by mouth daily. 30 tablet 0  lidocaine-prilocaine (EMLA) cream Apply 1 Application topically as needed. 30 g 3   LORazepam (ATIVAN) 0.5 MG tablet Take 1 tablet (0.5 mg total) by mouth 2 (two) times daily as needed for anxiety. 10 tablet 0   losartan-hydrochlorothiazide (HYZAAR) 100-25 MG tablet Take 1 tablet by mouth daily.      magic mouthwash (nystatin, lidocaine, diphenhydrAMINE, alum & mag hydroxide) suspension Swish and swallow 5 mLs by mouth 4 (four) times daily for 7 days 150 mL 0   omeprazole (PRILOSEC) 20 MG capsule Take 1 capsule (20 mg total) by mouth 2 (two) times daily. 28 capsule 0   ondansetron (ZOFRAN) 8 MG tablet Take 1 tablet (8 mg total) by mouth every 8 (eight) hours as needed for nausea. 30 tablet 3   No current facility-administered medications for this visit.   Facility-Administered Medications Ordered in Other Visits  Medication Dose Route Frequency Provider Last Rate Last Admin   0.9 %  sodium chloride infusion   Intravenous Once Bertis Ruddy, Samhitha Rosen, MD       gemcitabine (GEMZAR) 1,216 mg in sodium chloride 0.9 % 250 mL chemo infusion  600 mg/m2 (Treatment Plan Recorded) Intravenous Once Bertis Ruddy, Remigio Mcmillon, MD       heparin lock flush 100 unit/mL  500 Units Intracatheter Once PRN Bertis Ruddy, Nole Robey, MD       sodium chloride flush (NS) 0.9 % injection 10 mL  10 mL Intracatheter PRN Artis Delay, MD        SUMMARY OF ONCOLOGIC HISTORY: Oncology History Overview Note  High grade and LMS, 50% ER/PR positive dMMR normal, PD-L1 CPS 3%   Uterine sarcoma  06/02/2021 Imaging   MRI pelvis Heterogeneous 9.0 cm intrauterine mass within the intramural/submucosal anterior fundus demonstrating interval growth, internal enhancement, and restricted diffusion 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 No extra uterine extension. No evidence of metastatic disease within the pelvis.   Mild thickening of the endometrial stripe, likely related to entrapment by the adjacent uterine mass   06/03/2021 Initial Diagnosis   Uterine sarcoma (HCC)   06/03/2021 Cancer Staging   Staging form: Corpus Uteri - Sarcoma, AJCC 7th Edition - Clinical stage from 06/03/2021: FIGO Stage IVB (rT1b, N0, M1) - Signed by Artis Delay, MD on 03/31/2022 Diagnostic confirmation: Positive histology Stage prefix:  Recurrence Biopsy of metastatic site performed: No Lymph-vascular invasion (LVI): LVI present/identified, NOS   06/03/2021 Pathology Results   FINAL MICROSCOPIC DIAGNOSIS:   A. UTERUS, CERVIX, BILATERAL FALLOPIAN TUBES AND OVARIES, HYSTERECTOMY:  - Uterus:       Mixed high grade uterine sarcoma, spanning 6 cm, see comment.       Extensive lymphovascular invasion.       See oncology table.  - Cervix: Benign squamous and endocervical mucosa. No dysplasia or  malignancy.  - Bilateral ovaries: Unremarkable. No malignancy.  - Bilateral fallopian tubes: Unremarkable. No malignancy.   ONCOLOGY TABLE:   UTERUS, SARCOMA: Resection   Procedure: Total hysterectomy and bilateral salpingo-oophorectomy  Specimen Integrity: Focally disrupted on posterior surface  Tumor Site: Anterior wall  Tumor Size: 6 cm  Histologic Type: High grade sarcoma, mixed. See comment.  Other Tissue/ Organ Involvement: Not identified  Lymphovascular Invasion: Present, extensive.  Margins: All margins negative for tumor  Regional Lymph Nodes: Not applicable (no lymph nodes submitted or found)  Distant Metastasis:       Distant Site(s) Involved: Not applicable  Pathologic Stage Classification (pTNM, AJCC 8th Edition): pT1b, pN not  assigned  Ancillary Studies: Can be performed  upon request  Representative Tumor Block: A6  Comment(s): The tumor has two morphologically different components. One component consists of malignant spindle cells which can be seen arising from the smooth muscle. These foci are positive for SMA, desmin, cyclinD1 (focal), and CD10 (patchy). The other component consists of round to slightly spindle cells with abundant admixed vessels and occasional large pleomorphic cells. These areas are positive for SMA (patchy weak), desmin (focal), CD10 (variable with diffuse areas). Pancytokeratin is negative. 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   ADDENDUM:   PROGNOSTIC INDICATOR RESULTS:   Immunohistochemical and morphometric analysis performed manually    Estrogen Receptor:       POSITIVE, 50%, WEAK TO MODERATE STAINING  Progesterone Receptor:   POSITIVE, 50%, MODERATE TO STRONG STAINING   Reference Range Estrogen and Progesterone Receptor       Negative  0%       Positive  >1%    06/03/2021 Surgery   Surgeon: Quinn Axe    Assistants: Dr Antionette Char (an MD assistant was necessary for tissue manipulation, management of robotic instrumentation, retraction and positioning due to the complexity of the case and hospital policies).  Operation: Robotic-assisted laparoscopic total hysterectomy >250gm with bilateral salpingoophorectomy, minilaparotomy for specimen delivery   Surgeon: Quinn Axe    Operative Findings:  : Bulky 16cm uterus with intra-uterine mass (consistent with a fibroid-like mass) , smooth normal appearing serosa, no suspicious bulky nodes. Normal ovaries bilaterally. Normal upper abdomen. Uterus too large to deliver vaginally in tact.    06/18/2021 Imaging   1. Multiple bilateral pulmonary nodules, measuring up to 9 mm. Most of these are perifissural and subpleural in location, likely lymph nodes. Nevertheless, close follow-up recommended to exclude metastatic disease. 2. 11 mm hypoattenuating lesion towards the dome of the liver, indeterminate. MRI abdomen with and without contrast could be used to further evaluate as clinically warranted. 3. Aortic Atherosclerosis (ICD10-I70.0).   10/13/2021 Imaging   1. No acute findings within the abdomen or pelvis. No specific findings identified to suggest residual or recurrence of tumor. 2. Stable small pulmonary nodules within the left lower lobe.   03/18/2022 Imaging   1. Interval development of 2 soft tissue nodules in the anterior pelvis measuring 10 mm and 7 mm respectively. Close follow-up recommended to exclude metastatic  disease. PET-CT may be warranted to further evaluate. 2. Development of 2 mm left upper lobe parenchymal nodule, nonspecific. Close attention on follow-up imaging recommended  3. Stable appearance of index bilateral pulmonary nodules identified previously. 4. Aortic Atherosclerosis (ICD10-I70.0).   04/02/2022 PET scan   1. Signs of local tumor recurrence noted at the vaginal cuff. 2. 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 New since 10/13/2021 and progressive when compared with 03/17/2022. 3. New focal area of increased uptake within the right biceps femoris muscle which is suspicious for skeletal muscle involvement. 4. Stable appearance of small pulmonary nodules described on 03/17/2022. These are too small to characterize by PET-CT.   04/09/2022 Procedure   Successful placement of a right IJ approach Power Port with ultrasound and fluoroscopic guidance. The catheter is ready for use.   04/09/2022 Echocardiogram    1. Left ventricular ejection fraction, by estimation, is 65 to 70%. The left ventricle has normal function. The left ventricle has no regional wall motion abnormalities. There is mild concentric left ventricular hypertrophy. Left ventricular diastolic parameters are consistent with Grade I diastolic dysfunction (  impaired relaxation).  2. Right ventricular systolic function is normal. The right ventricular size is normal.  3. The mitral valve is normal in structure. No evidence of mitral valve regurgitation.  4. The aortic valve is tricuspid. Aortic valve regurgitation is not visualized. No aortic stenosis is present.  5. The inferior vena cava is normal in size with greater than 50% respiratory variability, suggesting right atrial pressure of 3 mmHg.   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 Status post hysterectomy and bilateral  oophorectomy. Multiple newand enlarged peritoneal nodules throughout the low abdomen and pelvis, previously FDG avid and consistent with worsened locally recurrent and peritoneal metastatic disease. 2. Multiple small bilateral pulmonary nodules, several of which are slightly enlarged compared to prior examination, consistent with worsened pulmonary metastatic disease.   Aortic Atherosclerosis (ICD10-I70.0).     06/25/2022 - 07/06/2022 Chemotherapy   Patient is on Treatment Plan : UTERINE UNDIFFERENTIATED / LEIOMYOSARCOMA Gemcitabine D1,8 + Docetaxel D8 (900/100) q21d     06/25/2022 -  Chemotherapy   Patient is on Treatment Plan : UTERINE UNDIFFERENTIATED LEIOMYOSARCOMA Gemcitabine D1,8 + Docetaxel D8 (900/100) q21d     09/02/2022 Imaging   1. Moderate response to therapy of peritoneal metastasis within the anterior pelvis. 2. Minimal response to therapy of pulmonary metastasis. 3. No new or progressive disease. 4.  Aortic Atherosclerosis (ICD10-I70.0).     12/01/2022 Imaging   1. No new or progressive findings in the chest, abdomen, or pelvis. 2. Peritoneal metastases in the pelvis described previously measure smaller today compatible with interval response to therapy. 3. Stable tiny bilateral pulmonary nodules. Previously characterized as metastatic lesions, these are unchanged in the interval. No new suspicious pulmonary nodule or mass. 4.  Aortic Atherosclerois (ICD10-170.0)   03/04/2023 Imaging   CT CHEST ABDOMEN PELVIS W CONTRAST  Result Date: 03/03/2023 CLINICAL DATA:  Cervical cancer * Tracking Code: BO *. Assess treatment response. Current chemotherapy. Prior total hysterectomy EXAM: CT CHEST, ABDOMEN, AND PELVIS WITH CONTRAST TECHNIQUE: Multidetector CT imaging of the chest, abdomen and pelvis was performed following the standard protocol during bolus administration of intravenous contrast. RADIATION DOSE REDUCTION: This exam was performed according to the departmental  dose-optimization program which includes automated exposure control, adjustment of the mA and/or kV according to patient size and/or use of iterative reconstruction technique. CONTRAST:  OMNIPAQUE IOHEXOL 300 MG/ML  SOLN COMPARISON:  CT 12/01/2022 and older FINDINGS: CT CHEST FINDINGS Cardiovascular: Right upper chest port. Heart is nonenlarged. Trace pericardial fluid. Normal caliber thoracic aorta. Mediastinum/Nodes: No specific abnormal lymph node enlargement seen in the axillary region, hilum or mediastinum. Mildly patulous thoracic esophagus. Thyroid gland is unremarkable. Lungs/Pleura: Tiny pleural effusions are seen, left-greater-than-right. These new from previous. There is some patchy parenchymal opacity identified in the superior segment of the lower lobes, left-greater-than-right. This could be infiltrative rather than neoplastic. Recommend short follow-up. There were some small nodules identified on the prior. For example 3 mm nodule lateral left lower lobe subpleural on series 7, image 91 is stable. Right lower lobe subpleural nodule on the prior which measured 9 x 6 mm, today is stable on image 73 of series 7. Stable 2-3 mm nodule right lower lobe on series 7, image 97. Musculoskeletal: Curvature of the spine with some degenerative changes. CT ABDOMEN PELVIS FINDINGS Hepatobiliary: Stable dome hepatic cyst. No new space-occupying lesion in the liver. Patent portal vein.  Gallbladder is distended. Pancreas: Unremarkable. No pancreatic ductal dilatation or surrounding inflammatory changes. Spleen: Spleen is slightly enlarged at 13.1 cm in AP length. Preserved enhancement. Small splenule inferiorly. Adrenals/Urinary Tract: The adrenal glands are preserved. Tiny low-attenuation cystic lesions are seen which are too small to completely characterize along the kidneys. Bosniak 2 lesions. No specific imaging follow-up. The ureters have normal course and caliber down to the bladder. Bladder wall is  diffusely thickened. The bladder is contracted. There is significant stranding. This is progressive from the previous examination. Stomach/Bowel: Large bowel has a normal course and caliber with scattered stool. Overall moderate stool burden. Normal appendix extends inferior to the cecum in the right lower quadrant. The stomach and small bowel are nondilated. Vascular/Lymphatic: Normal caliber aorta and IVC with some vascular calcifications. No specific abnormal lymph node enlargement identified in the abdomen and pelvis. Reproductive: Absence of the uterus and ovaries. Other: Once again there is a pelvic soft tissue nodule anteriorly on series 2, image 104 which on the prior measured 2.1 x 1.6 cm and today 1.9 x 1.3 cm. Smaller foci more caudal on axial image 111 and 112 are stable. Musculoskeletal: Curvature and degenerative changes are seen along the spine. Degenerative changes along the pelvis. IMPRESSION: Stable pelvic peritoneal nodules. No new lymph node enlargement seen in the chest, abdomen or pelvis. New tiny pleural effusions, left-greater-than-right with some ill-defined parenchymal opacities along the lower lobes. Although there is a differential this could be infectious or inflammatory. Recommend short follow-up and correlation to specific symptoms. Previous tiny lung nodules are stable. Continued follow-up surveillance as per the patient's neoplasm. Mild splenomegaly. Worsening bladder wall thickening and stranding. Please correlate with symptoms and treatment Electronically Signed   By: Karen Kays M.D.   On: 03/03/2023 18:23        PHYSICAL EXAMINATION: ECOG PERFORMANCE STATUS: 1 - Symptomatic but completely ambulatory  Vitals:   03/04/23 0800  BP: (!) 143/68  Pulse: (!) 124  Resp: 17  Temp: 97.6 F (36.4 C)  SpO2: 97%   Filed Weights   03/04/23 0800  Weight: 184 lb 6.4 oz (83.6 kg)    GENERAL:alert, no distress and comfortable SKIN: skin color, texture, turgor are normal, no  rashes or significant lesions EYES: normal, Conjunctiva are pink and non-injected, sclera clear OROPHARYNX:no exudate, no erythema and lips, buccal mucosa, and tongue normal  NECK: supple, thyroid normal size, non-tender, without nodularity LYMPH:  no palpable lymphadenopathy in the cervical, axillary or inguinal LUNGS: clear to auscultation and percussion with normal breathing effort HEART: regular rate & rhythm and no murmurs and no lower extremity edema ABDOMEN:abdomen soft, non-tender and normal bowel sounds Musculoskeletal:no cyanosis of digits and no clubbing  NEURO: alert & oriented x 3 with fluent speech, no focal motor/sensory deficits  LABORATORY DATA:  I have reviewed the data as listed    Component Value Date/Time   NA 136 03/02/2023 0751   K 4.2 03/02/2023 0751   CL 102 03/02/2023 0751   CO2 28 03/02/2023 0751   GLUCOSE 98 03/02/2023 0751   BUN 11 03/02/2023 0751   CREATININE 0.84 03/02/2023 0751   CALCIUM 9.0 03/02/2023 0751   PROT 6.1 (L) 03/02/2023 0751   ALBUMIN 3.3 (L) 03/02/2023 0751   AST 21 03/02/2023 0751   ALT 9 03/02/2023 0751   ALKPHOS 156 (H) 03/02/2023 0751   BILITOT 0.4 03/02/2023 0751   GFRNONAA >60 03/02/2023 0751    No results found for: "SPEP", "UPEP"  Lab Results  Component Value Date   WBC 52.3 (HH) 03/02/2023   NEUTROABS 38.2 (H) 03/02/2023   HGB 11.2 (L) 03/02/2023   HCT 33.5 (L) 03/02/2023   MCV 92.3 03/02/2023   PLT 177 03/02/2023      Chemistry      Component Value Date/Time   NA 136 03/02/2023 0751   K 4.2 03/02/2023 0751   CL 102 03/02/2023 0751   CO2 28 03/02/2023 0751   BUN 11 03/02/2023 0751   CREATININE 0.84 03/02/2023 0751      Component Value Date/Time   CALCIUM 9.0 03/02/2023 0751   ALKPHOS 156 (H) 03/02/2023 0751   AST 21 03/02/2023 0751   ALT 9 03/02/2023 0751   BILITOT 0.4 03/02/2023 0751       RADIOGRAPHIC STUDIES: I have reviewed multiple imaging studies with the patient and her husband I have  personally reviewed the radiological images as listed and agreed with the findings in the report. CT CHEST ABDOMEN PELVIS W CONTRAST  Result Date: 03/03/2023 CLINICAL DATA:  Cervical cancer * Tracking Code: BO *. Assess treatment response. Current chemotherapy. Prior total hysterectomy EXAM: CT CHEST, ABDOMEN, AND PELVIS WITH CONTRAST TECHNIQUE: Multidetector CT imaging of the chest, abdomen and pelvis was performed following the standard protocol during bolus administration of intravenous contrast. RADIATION DOSE REDUCTION: This exam was performed according to the departmental dose-optimization program which includes automated exposure control, adjustment of the mA and/or kV according to patient size and/or use of iterative reconstruction technique. CONTRAST:  OMNIPAQUE IOHEXOL 300 MG/ML  SOLN COMPARISON:  CT 12/01/2022 and older FINDINGS: CT CHEST FINDINGS Cardiovascular: Right upper chest port. Heart is nonenlarged. Trace pericardial fluid. Normal caliber thoracic aorta. Mediastinum/Nodes: No specific abnormal lymph node enlargement seen in the axillary region, hilum or mediastinum. Mildly patulous thoracic esophagus. Thyroid gland is unremarkable. Lungs/Pleura: Tiny pleural effusions are seen, left-greater-than-right. These new from previous. There is some patchy parenchymal opacity identified in the superior segment of the lower lobes, left-greater-than-right. This could be infiltrative rather than neoplastic. Recommend short follow-up. There were some small nodules identified on the prior. For example 3 mm nodule lateral left lower lobe subpleural on series 7, image 91 is stable. Right lower lobe subpleural nodule on the prior which measured 9 x 6 mm, today is stable on image 73 of series 7. Stable 2-3 mm nodule right lower lobe on series 7, image 97. Musculoskeletal: Curvature of the spine with some degenerative changes. CT ABDOMEN PELVIS FINDINGS Hepatobiliary: Stable dome hepatic cyst. No new  space-occupying lesion in the liver. Patent portal vein. Gallbladder is distended. Pancreas: Unremarkable. No pancreatic ductal dilatation or surrounding inflammatory changes. Spleen: Spleen is slightly enlarged at 13.1 cm in AP length. Preserved enhancement. Small splenule inferiorly. Adrenals/Urinary Tract: The adrenal glands are preserved. Tiny low-attenuation cystic lesions are seen which are too small to completely characterize along the kidneys. Bosniak 2 lesions. No specific imaging follow-up. The ureters have normal course and caliber down to the bladder. Bladder wall is diffusely thickened. The bladder is contracted. There is significant stranding. This is progressive from the previous examination. Stomach/Bowel: Large bowel has a normal course and caliber with scattered stool. Overall moderate stool burden. Normal appendix extends inferior to the cecum in the right lower quadrant. The stomach and small bowel are nondilated. Vascular/Lymphatic: Normal caliber aorta and IVC with some vascular calcifications. No specific abnormal lymph node enlargement identified in the abdomen and pelvis. Reproductive: Absence of the uterus and ovaries. Other: Once again there is a  pelvic soft tissue nodule anteriorly on series 2, image 104 which on the prior measured 2.1 x 1.6 cm and today 1.9 x 1.3 cm. Smaller foci more caudal on axial image 111 and 112 are stable. Musculoskeletal: Curvature and degenerative changes are seen along the spine. Degenerative changes along the pelvis. IMPRESSION: Stable pelvic peritoneal nodules. No new lymph node enlargement seen in the chest, abdomen or pelvis. New tiny pleural effusions, left-greater-than-right with some ill-defined parenchymal opacities along the lower lobes. Although there is a differential this could be infectious or inflammatory. Recommend short follow-up and correlation to specific symptoms. Previous tiny lung nodules are stable. Continued follow-up surveillance as per  the patient's neoplasm. Mild splenomegaly. Worsening bladder wall thickening and stranding. Please correlate with symptoms and treatment Electronically Signed   By: Karen Kays M.D.   On: 03/03/2023 18:23

## 2023-03-04 NOTE — Assessment & Plan Note (Signed)
I have reviewed multiple imaging studies with the patient and her husband Overall, all measurable disease are about the same I believe her response has nadired We discussed the risk, benefits, side effects of continuing treatment with chemotherapy with reduced dose of omission of Taxotere versus trial of immunotherapy versus antiestrogen therapy At the end of the day, she is undecided We will proceed with gemcitabine as scheduled today and she will call me next week with her final decision I plan to repeat imaging study in 3 months, again in July of this year

## 2023-03-09 ENCOUNTER — Telehealth: Payer: Self-pay | Admitting: Oncology

## 2023-03-09 NOTE — Telephone Encounter (Signed)
Called Leah Diaz regarding her treatment decision. She said she wants to do treatment and is trying to decide between the three choices. She is going to call back tomorrow with her decision.

## 2023-03-10 ENCOUNTER — Encounter: Payer: Self-pay | Admitting: Hematology and Oncology

## 2023-03-10 NOTE — Telephone Encounter (Signed)
Called [name] back and advised her of message from Dr. Bertis Ruddy.  She would be interested in setting up a virtual visit.

## 2023-03-10 NOTE — Telephone Encounter (Signed)
Leah Diaz is the common immunotherapy Letrozole is the common anti-estrogen For low BP, I suggest she cut back to 1/2 dose If she needs another meeting to discuss, we can set up virtual visit

## 2023-03-10 NOTE — Telephone Encounter (Signed)
I do not have anything this week Since she is back on 5/2, I can see her at 8 am

## 2023-03-10 NOTE — Telephone Encounter (Signed)
[name] called back and said she is still trying to decide on treatment. She asked which immunotherapy would be recommended for her and which antiestrogen pill it would be.   She also is taking the full dose of Hyzaar and is wondering if she needs to continue it.  Her BP yesterday was 101/59, this morning it was 92/60 when she first got up and 113/62 when she rechecked it.

## 2023-03-10 NOTE — Telephone Encounter (Signed)
Left a message with appointment for 03/18/23 at 8:00 with Dr. Bertis Ruddy.

## 2023-03-16 ENCOUNTER — Telehealth: Payer: Self-pay | Admitting: Oncology

## 2023-03-16 ENCOUNTER — Other Ambulatory Visit: Payer: Self-pay | Admitting: Hematology and Oncology

## 2023-03-16 NOTE — Telephone Encounter (Signed)
Leah Diaz called and asked if she would need to take dexamethasone the day before her infusion appointment on 03/18/23.  She said she doesn't think she would need to take it if she decides on the single treatment but wants to make sure.

## 2023-03-16 NOTE — Telephone Encounter (Signed)
She is scheduled for gem and taxotere I did not change anything because she did not make any decision to change since I saw her If she wants both, then she needs to take dex If she decides she only want gem, then no need to take dex

## 2023-03-16 NOTE — Telephone Encounter (Signed)
Called [name] back and advised her of message below from Dr. Bertis Ruddy.  She verbalized understanding and agreement.

## 2023-03-17 MED FILL — Dexamethasone Sodium Phosphate Inj 100 MG/10ML: INTRAMUSCULAR | Qty: 1 | Status: AC

## 2023-03-18 ENCOUNTER — Inpatient Hospital Stay: Payer: Managed Care, Other (non HMO) | Admitting: Hematology and Oncology

## 2023-03-18 ENCOUNTER — Inpatient Hospital Stay: Payer: Managed Care, Other (non HMO)

## 2023-03-18 ENCOUNTER — Inpatient Hospital Stay: Payer: Managed Care, Other (non HMO) | Attending: Gynecologic Oncology

## 2023-03-18 ENCOUNTER — Encounter: Payer: Self-pay | Admitting: Hematology and Oncology

## 2023-03-18 ENCOUNTER — Telehealth: Payer: Self-pay

## 2023-03-18 VITALS — BP 107/69 | HR 126 | Temp 97.6°F | Resp 18 | Ht 67.5 in | Wt 189.0 lb

## 2023-03-18 DIAGNOSIS — R000 Tachycardia, unspecified: Secondary | ICD-10-CM | POA: Insufficient documentation

## 2023-03-18 DIAGNOSIS — I10 Essential (primary) hypertension: Secondary | ICD-10-CM | POA: Insufficient documentation

## 2023-03-18 DIAGNOSIS — Z79899 Other long term (current) drug therapy: Secondary | ICD-10-CM | POA: Insufficient documentation

## 2023-03-18 DIAGNOSIS — C55 Malignant neoplasm of uterus, part unspecified: Secondary | ICD-10-CM | POA: Diagnosis present

## 2023-03-18 DIAGNOSIS — Z780 Asymptomatic menopausal state: Secondary | ICD-10-CM

## 2023-03-18 DIAGNOSIS — C786 Secondary malignant neoplasm of retroperitoneum and peritoneum: Secondary | ICD-10-CM | POA: Diagnosis not present

## 2023-03-18 DIAGNOSIS — T451X5A Adverse effect of antineoplastic and immunosuppressive drugs, initial encounter: Secondary | ICD-10-CM

## 2023-03-18 DIAGNOSIS — C549 Malignant neoplasm of corpus uteri, unspecified: Secondary | ICD-10-CM | POA: Diagnosis not present

## 2023-03-18 DIAGNOSIS — Z90710 Acquired absence of both cervix and uterus: Secondary | ICD-10-CM | POA: Diagnosis not present

## 2023-03-18 DIAGNOSIS — D6481 Anemia due to antineoplastic chemotherapy: Secondary | ICD-10-CM

## 2023-03-18 DIAGNOSIS — M8580 Other specified disorders of bone density and structure, unspecified site: Secondary | ICD-10-CM

## 2023-03-18 LAB — CBC WITH DIFFERENTIAL (CANCER CENTER ONLY)
Abs Immature Granulocytes: 0.02 10*3/uL (ref 0.00–0.07)
Basophils Absolute: 0 10*3/uL (ref 0.0–0.1)
Basophils Relative: 1 %
Eosinophils Absolute: 0.9 10*3/uL — ABNORMAL HIGH (ref 0.0–0.5)
Eosinophils Relative: 17 %
HCT: 30.8 % — ABNORMAL LOW (ref 36.0–46.0)
Hemoglobin: 10.2 g/dL — ABNORMAL LOW (ref 12.0–15.0)
Immature Granulocytes: 0 %
Lymphocytes Relative: 19 %
Lymphs Abs: 0.9 10*3/uL (ref 0.7–4.0)
MCH: 31.2 pg (ref 26.0–34.0)
MCHC: 33.1 g/dL (ref 30.0–36.0)
MCV: 94.2 fL (ref 80.0–100.0)
Monocytes Absolute: 0.9 10*3/uL (ref 0.1–1.0)
Monocytes Relative: 17 %
Neutro Abs: 2.2 10*3/uL (ref 1.7–7.7)
Neutrophils Relative %: 46 %
Platelet Count: 527 10*3/uL — ABNORMAL HIGH (ref 150–400)
RBC: 3.27 MIL/uL — ABNORMAL LOW (ref 3.87–5.11)
RDW: 24 % — ABNORMAL HIGH (ref 11.5–15.5)
WBC Count: 4.9 10*3/uL (ref 4.0–10.5)
nRBC: 0 % (ref 0.0–0.2)

## 2023-03-18 LAB — CMP (CANCER CENTER ONLY)
ALT: 9 U/L (ref 0–44)
AST: 17 U/L (ref 15–41)
Albumin: 3.1 g/dL — ABNORMAL LOW (ref 3.5–5.0)
Alkaline Phosphatase: 69 U/L (ref 38–126)
Anion gap: 7 (ref 5–15)
BUN: 16 mg/dL (ref 8–23)
CO2: 26 mmol/L (ref 22–32)
Calcium: 8.8 mg/dL — ABNORMAL LOW (ref 8.9–10.3)
Chloride: 101 mmol/L (ref 98–111)
Creatinine: 0.85 mg/dL (ref 0.44–1.00)
GFR, Estimated: >60 mL/min (ref >60)
Glucose, Bld: 108 mg/dL — ABNORMAL HIGH (ref 70–99)
Potassium: 3.9 mmol/L (ref 3.5–5.1)
Sodium: 134 mmol/L — ABNORMAL LOW (ref 135–145)
Total Bilirubin: 0.4 mg/dL (ref 0.3–1.2)
Total Protein: 5.7 g/dL — ABNORMAL LOW (ref 6.5–8.1)

## 2023-03-18 MED ORDER — SODIUM CHLORIDE 0.9% FLUSH
10.0000 mL | Freq: Once | INTRAVENOUS | Status: AC
  Administered 2023-03-18: 10 mL

## 2023-03-18 MED ORDER — EXEMESTANE 25 MG PO TABS: 25.0000 mg | ORAL_TABLET | Freq: Every day | ORAL | 1 refills | Status: DC

## 2023-03-18 NOTE — Telephone Encounter (Signed)
-----   Message from Artis Delay, MD sent at 03/18/2023  8:23 AM EDT ----- Pls schedule bone density some time before next month

## 2023-03-18 NOTE — Assessment & Plan Note (Signed)
Her blood pressure is now low and she is tachycardic and retaining fluids Recommend discontinuation of hydrochlorothiazide/losartan She will continue to take Lasix as needed

## 2023-03-18 NOTE — Assessment & Plan Note (Signed)
I anticipate recovery of her blood count with discontinuation of chemotherapy

## 2023-03-18 NOTE — Progress Notes (Signed)
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[facility]) We discussed the risk and benefits of taking a chemotherapy holiday and switching over to antiestrogen therapy as maintenance The patient has rule out the possibility of being on immunotherapy After a lot of discussion, she is in agreement to try antiestrogen therapy I will get baseline bone density scan in the next few weeks She is advised to take calcium and vitamin D supplement She is aware about risk of mood swings as well as intermittent hot flashes with antiestrogen therapy I will see her back in a month for further follow-up  Anemia due to antineoplastic chemotherapy I anticipate recovery of her blood count with discontinuation of chemotherapy  Essential hypertension Her blood pressure is now low and she is tachycardic and retaining fluids Recommend discontinuation of hydrochlorothiazide/losartan She will continue to take Lasix as needed  Orders Placed This Encounter  Procedures   DG BONE DENSITY (DXA)    Standing Status:   Future    Standing Expiration Date:   03/17/2024    Order Specific Question:   Reason for Exam (SYMPTOM  OR DIAGNOSIS REQUIRED)    Answer:   osteopenia    Order Specific Question:   Preferred imaging location?    Answer:   [facility]   Comprehensive metabolic panel    Standing Status:   Standing    Number of Occurrences:   33    Standing Expiration Date:   03/17/2024   CBC with Differential/Platelet    Standing Status:   Standing    Number of Occurrences:   22    Standing Expiration Date:   03/17/2024    All questions were answered. The patient knows to call the clinic with any problems, questions or concerns. The total time spent in the appointment was 30 minutes  encounter with patients including review of chart and various tests results, discussions about plan of care and coordination of care plan   Artis Delay, MD 03/18/2023 9:11 AM  INTERVAL HISTORY: Please see below for problem oriented charting. she returns for treatment follow-up We reviewed plan of care and the rationale behind taking chemotherapy break She has gained a lot of fluid and had intermittent congestion at night Her blood pressure is low We went back and forth about different treatment options and ultimately, she decides to switch over to antiestrogen therapy  REVIEW OF SYSTEMS:   Constitutional: Denies fevers, chills or abnormal weight loss Eyes: Denies blurriness of vision Ears, nose, mouth, throat, and face: Denies mucositis or sore throat Respiratory: Denies cough, dyspnea or wheezes Cardiovascular: Denies palpitation, chest discomfort or lower extremity swelling Gastrointestinal:  Denies nausea, heartburn or change in bowel habits Skin: Denies abnormal skin rashes Lymphatics: Denies new lymphadenopathy or easy bruising Neurological:Denies numbness, tingling or new weaknesses Behavioral/Psych: Mood is stable, no new changes  All other systems were reviewed with the patient and are negative.  I have reviewed the past medical history, past surgical history, social history and family history with the patient and they are unchanged from previous note.  ALLERGIES:  has No Known Allergies.  MEDICATIONS:  Current Outpatient Medications  Medication Sig Dispense Refill   exemestane (AROMASIN) 25 MG tablet Take 1 tablet (25 mg total) by mouth daily after breakfast. 30 tablet 1  acetaminophen (TYLENOL) 500 MG tablet Take 500 mg by mouth every 6 (six) hours as needed for mild pain, moderate pain or fever.     aspirin 81 MG EC tablet Take by mouth daily.     cetirizine (ZYRTEC) 10 MG tablet Take 10 mg by mouth daily.     Cholecalciferol (VITAMIN D3 PO) Take 1 tablet by mouth daily.      furosemide (LASIX) 20 MG tablet Take 1 tablet (20 mg total) by mouth daily. 30 tablet 0   lidocaine-prilocaine (EMLA) cream Apply 1 Application topically as needed. 30 g 3   LORazepam (ATIVAN) 0.5 MG tablet Take 1 tablet (0.5 mg total) by mouth 2 (two) times daily as needed for anxiety. 10 tablet 0   omeprazole (PRILOSEC) 20 MG capsule Take 1 capsule (20 mg total) by mouth 2 (two) times daily. 28 capsule 0   ondansetron (ZOFRAN) 8 MG tablet Take 1 tablet (8 mg total) by mouth every 8 (eight) hours as needed for nausea. 30 tablet 3   No current facility-administered medications for this visit.    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 No extra uterine extension. No evidence of metastatic disease within the pelvis.   Mild thickening of the endometrial stripe, likely related to entrapment by the adjacent uterine mass   06/03/2021 Initial Diagnosis   Uterine sarcoma (HCC)   06/03/2021 Cancer Staging   Staging form: Corpus Uteri - Sarcoma, AJCC 7th Edition - Clinical stage from 06/03/2021: FIGO Stage IVB (rT1b, N0, M1) - Signed by Artis Delay, MD on 03/31/2022 Diagnostic confirmation: Positive histology Stage prefix: Recurrence Biopsy of metastatic site performed: No Lymph-vascular invasion (LVI): LVI present/identified, NOS   06/03/2021 Pathology Results   FINAL MICROSCOPIC DIAGNOSIS:   A. UTERUS, CERVIX, BILATERAL FALLOPIAN TUBES AND OVARIES, HYSTERECTOMY:  - Uterus:       Mixed high grade uterine sarcoma, spanning 6 cm, see comment.       Extensive lymphovascular invasion.       See oncology table.   - Cervix: Benign squamous and endocervical mucosa. No dysplasia or  malignancy.  - Bilateral ovaries: Unremarkable. No malignancy.  - Bilateral fallopian tubes: Unremarkable. No malignancy.   ONCOLOGY TABLE:   UTERUS, SARCOMA: Resection   Procedure: Total hysterectomy and bilateral salpingo-oophorectomy  Specimen Integrity: Focally disrupted on posterior surface  Tumor Site: Anterior wall  Tumor Size: 6 cm  Histologic Type: High grade sarcoma, mixed. See comment.  Other Tissue/ Organ Involvement: Not identified  Lymphovascular Invasion: Present, extensive.  Margins: All margins negative for tumor  Regional Lymph Nodes: Not applicable (no lymph nodes submitted or found)  Distant Metastasis:       Distant Site(s) Involved: Not applicable  Pathologic Stage Classification (pTNM, AJCC 8th Edition): pT1b, pN not  assigned  Ancillary Studies: Can be performed upon request  Representative Tumor Block: A6  Comment(s): The tumor has two morphologically different components. One component consists of malignant spindle cells which can be seen arising from the smooth muscle. These foci are positive for SMA, desmin, cyclinD1 (focal), and CD10 (patchy). The other component consists of round to slightly spindle cells with abundant admixed vessels and occasional large pleomorphic cells. These areas  are positive for SMA (patchy weak), desmin (focal), CD10 (variable with diffuse areas). Pancytokeratin is negative. 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   ADDENDUM:   PROGNOSTIC INDICATOR RESULTS:   Immunohistochemical and morphometric analysis performed manually    Estrogen Receptor:       POSITIVE, 50%, WEAK TO MODERATE STAINING  Progesterone Receptor:   POSITIVE, 50%, MODERATE TO STRONG STAINING   Reference Range Estrogen and Progesterone Receptor       Negative  0%       Positive  >1%    06/03/2021 Surgery   Surgeon:  Quinn Axe    Assistants: Dr Antionette Char (an MD assistant was necessary for tissue manipulation, management of robotic instrumentation, retraction and positioning due to the complexity of the case and hospital policies).  Operation: Robotic-assisted laparoscopic total hysterectomy >250gm with bilateral salpingoophorectomy, minilaparotomy for specimen delivery   Surgeon: Quinn Axe    Operative Findings:  : Bulky 16cm uterus with intra-uterine mass (consistent with a fibroid-like mass) , smooth normal appearing serosa, no suspicious bulky nodes. Normal ovaries bilaterally. Normal upper abdomen. Uterus too large to deliver vaginally in tact.    06/18/2021 Imaging   1. Multiple bilateral pulmonary nodules, measuring up to 9 mm. Most of these are perifissural and subpleural in location, likely lymph nodes. Nevertheless, close follow-up recommended to exclude metastatic disease. 2. 11 mm hypoattenuating lesion towards the dome of the liver, indeterminate. MRI abdomen with and without contrast could be used to further evaluate as clinically warranted. 3. Aortic Atherosclerosis (ICD10-I70.0).   10/13/2021 Imaging   1. No acute findings within the abdomen or pelvis. No specific findings identified to suggest residual or recurrence of tumor. 2. Stable small pulmonary nodules within the left lower lobe.   03/18/2022 Imaging   1. Interval development of 2 soft tissue nodules in the anterior pelvis measuring 10 mm and 7 mm respectively. Close follow-up recommended to exclude metastatic disease. PET-CT may be warranted to further evaluate. 2. Development of 2 mm left upper lobe parenchymal nodule, nonspecific. Close attention on follow-up imaging recommended  3. Stable appearance of index bilateral pulmonary nodules identified previously. 4. Aortic Atherosclerosis (ICD10-I70.0).   04/02/2022 PET scan   1. Signs of local tumor recurrence noted at the vaginal cuff. 2. Multifocal  tracer avid peritoneal nodules concerning for peritoneal carcinomatosis. New since 10/13/2021 and progressive when compared with 03/17/2022. 3. New focal area of increased uptake within the right biceps femoris muscle which is suspicious for skeletal muscle involvement. 4. Stable appearance of small pulmonary nodules described on 03/17/2022. These are too small to characterize by PET-CT.   04/09/2022 Procedure   Successful placement of a right IJ approach Power Port with ultrasound and fluoroscopic guidance. The catheter is ready for use.   04/09/2022 Echocardiogram    1. Left ventricular ejection fraction, by estimation, is 65 to 70%. The left ventricle has normal function. The left ventricle has no regional wall motion abnormalities. There is mild concentric left ventricular hypertrophy. Left ventricular diastolic parameters are consistent with Grade I diastolic dysfunction (impaired relaxation).  2. Right ventricular systolic function is normal. The right ventricular size is normal.  3. The mitral valve is normal in structure. No evidence of mitral valve regurgitation.  4. The aortic valve is tricuspid. Aortic valve regurgitation is not visualized. No aortic stenosis is present.  5. The inferior vena cava is normal in size with greater than 50% respiratory variability,  suggesting right atrial pressure of 3 mmHg.   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 Status post hysterectomy and bilateral oophorectomy. Multiple newand enlarged peritoneal nodules throughout the low abdomen and pelvis, previously FDG avid and consistent with worsened locally recurrent and peritoneal metastatic disease. 2. Multiple small bilateral pulmonary nodules, several of which are slightly enlarged compared to prior examination, consistent with worsened pulmonary metastatic disease.   Aortic Atherosclerosis  (ICD10-I70.0).     06/25/2022 - 07/06/2022 Chemotherapy   Patient is on Treatment Plan : UTERINE UNDIFFERENTIATED / LEIOMYOSARCOMA Gemcitabine D1,8 + Docetaxel D8 (900/100) q21d     06/25/2022 - 03/04/2023 Chemotherapy   Patient is on Treatment Plan : UTERINE UNDIFFERENTIATED LEIOMYOSARCOMA Gemcitabine D1,8 + Docetaxel D8 (900/100) q21d     09/02/2022 Imaging   1. Moderate response to therapy of peritoneal metastasis within the anterior pelvis. 2. Minimal response to therapy of pulmonary metastasis. 3. No new or progressive disease. 4.  Aortic Atherosclerosis (ICD10-I70.0).     12/01/2022 Imaging   1. No new or progressive findings in the chest, abdomen, or pelvis. 2. Peritoneal metastases in the pelvis described previously measure smaller today compatible with interval response to therapy. 3. Stable tiny bilateral pulmonary nodules. Previously characterized as metastatic lesions, these are unchanged in the interval. No new suspicious pulmonary nodule or mass. 4.  Aortic Atherosclerois (ICD10-170.0)   03/04/2023 Imaging   CT CHEST ABDOMEN PELVIS W CONTRAST  Result Date: 03/03/2023 CLINICAL DATA:  Cervical cancer * Tracking Code: BO *. Assess treatment response. Current chemotherapy. Prior total hysterectomy EXAM: CT CHEST, ABDOMEN, AND PELVIS WITH CONTRAST TECHNIQUE: Multidetector CT imaging of the chest, abdomen and pelvis was performed following the standard protocol during bolus administration of intravenous contrast. RADIATION DOSE REDUCTION: This exam was performed according to the departmental dose-optimization program which includes automated exposure control, adjustment of the mA and/or kV according to patient size and/or use of iterative reconstruction technique. CONTRAST:  OMNIPAQUE IOHEXOL 300 MG/ML  SOLN COMPARISON:  CT 12/01/2022 and older FINDINGS: CT CHEST FINDINGS Cardiovascular: Right upper chest port. Heart is nonenlarged. Trace pericardial fluid. Normal caliber thoracic  aorta. Mediastinum/Nodes: No specific abnormal lymph node enlargement seen in the axillary region, hilum or mediastinum. Mildly patulous thoracic esophagus. Thyroid gland is unremarkable. Lungs/Pleura: Tiny pleural effusions are seen, left-greater-than-right. These new from previous. There is some patchy parenchymal opacity identified in the superior segment of the lower lobes, left-greater-than-right. This could be infiltrative rather than neoplastic. Recommend short follow-up. There were some small nodules identified on the prior. For example 3 mm nodule lateral left lower lobe subpleural on series 7, image 91 is stable. Right lower lobe subpleural nodule on the prior which measured 9 x 6 mm, today is stable on image 73 of series 7. Stable 2-3 mm nodule right lower lobe on series 7, image 97. Musculoskeletal: Curvature of the spine with some degenerative changes. CT ABDOMEN PELVIS FINDINGS Hepatobiliary: Stable dome hepatic cyst. No new space-occupying lesion in the liver. Patent portal vein. Gallbladder is distended. Pancreas: Unremarkable. No pancreatic ductal dilatation or surrounding inflammatory changes. Spleen: Spleen is slightly enlarged at 13.1 cm in AP length. Preserved enhancement. Small splenule inferiorly. Adrenals/Urinary Tract: The adrenal glands are preserved. Tiny low-attenuation cystic lesions are seen which are too small to completely characterize along the kidneys. Bosniak 2 lesions. No specific imaging follow-up. The ureters have normal course and  caliber down to the bladder. Bladder wall is diffusely thickened. The bladder is contracted. There is significant stranding. This is progressive from the previous examination. Stomach/Bowel: Large bowel has a normal course and caliber with scattered stool. Overall moderate stool burden. Normal appendix extends inferior to the cecum in the right lower quadrant. The stomach and small bowel are nondilated. Vascular/Lymphatic: Normal caliber aorta and IVC  with some vascular calcifications. No specific abnormal lymph node enlargement identified in the abdomen and pelvis. Reproductive: Absence of the uterus and ovaries. Other: Once again there is a pelvic soft tissue nodule anteriorly on series 2, image 104 which on the prior measured 2.1 x 1.6 cm and today 1.9 x 1.3 cm. Smaller foci more caudal on axial image 111 and 112 are stable. Musculoskeletal: Curvature and degenerative changes are seen along the spine. Degenerative changes along the pelvis. IMPRESSION: Stable pelvic peritoneal nodules. No new lymph node enlargement seen in the chest, abdomen or pelvis. New tiny pleural effusions, left-greater-than-right with some ill-defined parenchymal opacities along the lower lobes. Although there is a differential this could be infectious or inflammatory. Recommend short follow-up and correlation to specific symptoms. Previous tiny lung nodules are stable. Continued follow-up surveillance as per the patient's neoplasm. Mild splenomegaly. Worsening bladder wall thickening and stranding. Please correlate with symptoms and treatment Electronically Signed   By: Karen Kays M.D.   On: 03/03/2023 18:23        PHYSICAL EXAMINATION: ECOG PERFORMANCE STATUS: 1 - Symptomatic but completely ambulatory  Vitals:   03/18/23 0810  BP: 107/69  Pulse: (!) 126  Resp: 18  Temp: 97.6 F (36.4 C)  SpO2: 91%   Filed Weights   03/18/23 0810  Weight: 189 lb (85.7 kg)    GENERAL:alert, no distress and comfortable  NEURO: alert & oriented x 3 with fluent speech, no focal motor/sensory deficits  LABORATORY DATA:  I have reviewed the data as listed    Component Value Date/Time   NA 134 (L) 03/18/2023 0738   K 3.9 03/18/2023 0738   CL 101 03/18/2023 0738   CO2 26 03/18/2023 0738   GLUCOSE 108 (H) 03/18/2023 0738   BUN 16 03/18/2023 0738   CREATININE 0.85 03/18/2023 0738   CALCIUM 8.8 (L) 03/18/2023 0738   PROT 5.7 (L) 03/18/2023 0738   ALBUMIN 3.1 (L) 03/18/2023  0738   AST 17 03/18/2023 0738   ALT 9 03/18/2023 0738   ALKPHOS 69 03/18/2023 0738   BILITOT 0.4 03/18/2023 0738   GFRNONAA >60 03/18/2023 0738    No results found for: "SPEP", "UPEP"  Lab Results  Component Value Date   WBC 4.9 03/18/2023   NEUTROABS 2.2 03/18/2023   HGB 10.2 (L) 03/18/2023   HCT 30.8 (L) 03/18/2023   MCV 94.2 03/18/2023   PLT 527 (H) 03/18/2023      Chemistry      Component Value Date/Time   NA 134 (L) 03/18/2023 0738   K 3.9 03/18/2023 0738   CL 101 03/18/2023 0738   CO2 26 03/18/2023 0738   BUN 16 03/18/2023 0738   CREATININE 0.85 03/18/2023 0738      Component Value Date/Time   CALCIUM 8.8 (L) 03/18/2023 0738   ALKPHOS 69 03/18/2023 0738   AST 17 03/18/2023 0738   ALT 9 03/18/2023 0738   BILITOT 0.4 03/18/2023 4782

## 2023-03-18 NOTE — Assessment & Plan Note (Signed)
We discussed the risk and benefits of taking a chemotherapy holiday and switching over to antiestrogen therapy as maintenance The patient has rule out the possibility of being on immunotherapy After a lot of discussion, she is in agreement to try antiestrogen therapy I will get baseline bone density scan in the next few weeks She is advised to take calcium and vitamin D supplement She is aware about risk of mood swings as well as intermittent hot flashes with antiestrogen therapy I will see her back in a month for further follow-up

## 2023-03-18 NOTE — Telephone Encounter (Signed)
Called and given appt at [facility] on 5/13, arrive at 1145 for 1200 appt. No calcium for 48 hours prior to appt and wear 2 piece clothing. She is aware of appt.

## 2023-03-19 ENCOUNTER — Telehealth: Payer: Self-pay | Admitting: Hematology and Oncology

## 2023-03-19 NOTE — Telephone Encounter (Signed)
Spoke with patient confirming upcoming appointment  

## 2023-03-20 ENCOUNTER — Inpatient Hospital Stay: Payer: Managed Care, Other (non HMO)

## 2023-03-23 ENCOUNTER — Other Ambulatory Visit: Payer: Self-pay

## 2023-03-23 ENCOUNTER — Encounter (HOSPITAL_COMMUNITY): Payer: Self-pay

## 2023-03-23 ENCOUNTER — Telehealth: Payer: Self-pay | Admitting: Oncology

## 2023-03-23 ENCOUNTER — Emergency Department (HOSPITAL_COMMUNITY): Payer: Managed Care, Other (non HMO)

## 2023-03-23 ENCOUNTER — Inpatient Hospital Stay (HOSPITAL_COMMUNITY)
Admission: EM | Admit: 2023-03-23 | Discharge: 2023-03-26 | DRG: 291 | Disposition: A | Discharge status: Home / Self Care | Payer: Managed Care, Other (non HMO) | Attending: Internal Medicine | Admitting: Internal Medicine

## 2023-03-23 DIAGNOSIS — D638 Anemia in other chronic diseases classified elsewhere: Secondary | ICD-10-CM | POA: Diagnosis not present

## 2023-03-23 DIAGNOSIS — J810 Acute pulmonary edema: Secondary | ICD-10-CM

## 2023-03-23 DIAGNOSIS — I5033 Acute on chronic diastolic (congestive) heart failure: Secondary | ICD-10-CM | POA: Diagnosis not present

## 2023-03-23 DIAGNOSIS — E222 Syndrome of inappropriate secretion of antidiuretic hormone: Secondary | ICD-10-CM | POA: Diagnosis present

## 2023-03-23 DIAGNOSIS — C7801 Secondary malignant neoplasm of right lung: Secondary | ICD-10-CM | POA: Diagnosis present

## 2023-03-23 DIAGNOSIS — Z6827 Body mass index (BMI) 27.0-27.9, adult: Secondary | ICD-10-CM

## 2023-03-23 DIAGNOSIS — E876 Hypokalemia: Secondary | ICD-10-CM | POA: Diagnosis present

## 2023-03-23 DIAGNOSIS — Z79899 Other long term (current) drug therapy: Secondary | ICD-10-CM

## 2023-03-23 DIAGNOSIS — Z79811 Long term (current) use of aromatase inhibitors: Secondary | ICD-10-CM | POA: Diagnosis not present

## 2023-03-23 DIAGNOSIS — C549 Malignant neoplasm of corpus uteri, unspecified: Secondary | ICD-10-CM | POA: Diagnosis not present

## 2023-03-23 DIAGNOSIS — I110 Hypertensive heart disease with heart failure: Principal | ICD-10-CM | POA: Diagnosis present

## 2023-03-23 DIAGNOSIS — Z9221 Personal history of antineoplastic chemotherapy: Secondary | ICD-10-CM | POA: Diagnosis not present

## 2023-03-23 DIAGNOSIS — J90 Pleural effusion, not elsewhere classified: Secondary | ICD-10-CM | POA: Diagnosis not present

## 2023-03-23 DIAGNOSIS — E871 Hypo-osmolality and hyponatremia: Secondary | ICD-10-CM | POA: Diagnosis not present

## 2023-03-23 DIAGNOSIS — J309 Allergic rhinitis, unspecified: Secondary | ICD-10-CM | POA: Diagnosis present

## 2023-03-23 DIAGNOSIS — J9601 Acute respiratory failure with hypoxia: Secondary | ICD-10-CM | POA: Diagnosis not present

## 2023-03-23 DIAGNOSIS — E872 Acidosis, unspecified: Secondary | ICD-10-CM | POA: Diagnosis not present

## 2023-03-23 DIAGNOSIS — Z800 Family history of malignant neoplasm of digestive organs: Secondary | ICD-10-CM | POA: Diagnosis not present

## 2023-03-23 DIAGNOSIS — J189 Pneumonia, unspecified organism: Secondary | ICD-10-CM | POA: Diagnosis present

## 2023-03-23 DIAGNOSIS — E43 Unspecified severe protein-calorie malnutrition: Secondary | ICD-10-CM | POA: Diagnosis present

## 2023-03-23 DIAGNOSIS — C7802 Secondary malignant neoplasm of left lung: Secondary | ICD-10-CM | POA: Diagnosis present

## 2023-03-23 DIAGNOSIS — T501X5A Adverse effect of loop [high-ceiling] diuretics, initial encounter: Secondary | ICD-10-CM | POA: Diagnosis present

## 2023-03-23 DIAGNOSIS — Z7982 Long term (current) use of aspirin: Secondary | ICD-10-CM

## 2023-03-23 DIAGNOSIS — R000 Tachycardia, unspecified: Secondary | ICD-10-CM | POA: Diagnosis not present

## 2023-03-23 DIAGNOSIS — I10 Essential (primary) hypertension: Secondary | ICD-10-CM | POA: Diagnosis not present

## 2023-03-23 DIAGNOSIS — K219 Gastro-esophageal reflux disease without esophagitis: Secondary | ICD-10-CM | POA: Diagnosis present

## 2023-03-23 DIAGNOSIS — J301 Allergic rhinitis due to pollen: Secondary | ICD-10-CM

## 2023-03-23 DIAGNOSIS — Z90710 Acquired absence of both cervix and uterus: Secondary | ICD-10-CM | POA: Diagnosis not present

## 2023-03-23 DIAGNOSIS — C55 Malignant neoplasm of uterus, part unspecified: Secondary | ICD-10-CM | POA: Diagnosis present

## 2023-03-23 LAB — CBC WITH DIFFERENTIAL/PLATELET
Abs Immature Granulocytes: 0.02 10*3/uL (ref 0.00–0.07)
Basophils Absolute: 0.1 10*3/uL (ref 0.0–0.1)
Basophils Relative: 2 %
Eosinophils Absolute: 0.4 10*3/uL (ref 0.0–0.5)
Eosinophils Relative: 5 %
HCT: 32.8 % — ABNORMAL LOW (ref 36.0–46.0)
Hemoglobin: 10.6 g/dL — ABNORMAL LOW (ref 12.0–15.0)
Immature Granulocytes: 0 %
Lymphocytes Relative: 24 %
Lymphs Abs: 1.6 10*3/uL (ref 0.7–4.0)
MCH: 30.1 pg (ref 26.0–34.0)
MCHC: 32.3 g/dL (ref 30.0–36.0)
MCV: 93.2 fL (ref 80.0–100.0)
Monocytes Absolute: 1.4 10*3/uL — ABNORMAL HIGH (ref 0.1–1.0)
Monocytes Relative: 21 %
Neutro Abs: 3.1 10*3/uL (ref 1.7–7.7)
Neutrophils Relative %: 48 %
Platelets: 636 10*3/uL — ABNORMAL HIGH (ref 150–400)
RBC: 3.52 MIL/uL — ABNORMAL LOW (ref 3.87–5.11)
RDW: 22.8 % — ABNORMAL HIGH (ref 11.5–15.5)
WBC: 6.6 10*3/uL (ref 4.0–10.5)
nRBC: 0 % (ref 0.0–0.2)

## 2023-03-23 LAB — COMPREHENSIVE METABOLIC PANEL
ALT: 12 U/L (ref 0–44)
AST: 18 U/L (ref 15–41)
Albumin: 2.8 g/dL — ABNORMAL LOW (ref 3.5–5.0)
Alkaline Phosphatase: 61 U/L (ref 38–126)
Anion gap: 10 (ref 5–15)
BUN: 16 mg/dL (ref 8–23)
CO2: 24 mmol/L (ref 22–32)
Calcium: 8.4 mg/dL — ABNORMAL LOW (ref 8.9–10.3)
Chloride: 97 mmol/L — ABNORMAL LOW (ref 98–111)
Creatinine, Ser: 1.06 mg/dL — ABNORMAL HIGH (ref 0.44–1.00)
GFR, Estimated: 59 mL/min — ABNORMAL LOW (ref >60)
Glucose, Bld: 116 mg/dL — ABNORMAL HIGH (ref 70–99)
Potassium: 3.2 mmol/L — ABNORMAL LOW (ref 3.5–5.1)
Sodium: 131 mmol/L — ABNORMAL LOW (ref 135–145)
Total Bilirubin: 0.6 mg/dL (ref 0.3–1.2)
Total Protein: 5.8 g/dL — ABNORMAL LOW (ref 6.5–8.1)

## 2023-03-23 LAB — TROPONIN I (HIGH SENSITIVITY): Troponin I (High Sensitivity): 12 ng/L (ref <18)

## 2023-03-23 LAB — BRAIN NATRIURETIC PEPTIDE: B Natriuretic Peptide: 95 pg/mL (ref 0.0–100.0)

## 2023-03-23 LAB — MAGNESIUM: Magnesium: 1.4 mg/dL — ABNORMAL LOW (ref 1.7–2.4)

## 2023-03-23 LAB — LACTIC ACID, PLASMA: Lactic Acid, Venous: 2 mmol/L (ref 0.5–1.9)

## 2023-03-23 MED ORDER — SODIUM CHLORIDE 0.9 % IV SOLN
1.0000 g | Freq: Once | INTRAVENOUS | Status: AC
  Administered 2023-03-23: 1 g via INTRAVENOUS
  Filled 2023-03-23: qty 10

## 2023-03-23 MED ORDER — SODIUM CHLORIDE 0.9 % IV SOLN
1.0000 g | INTRAVENOUS | Status: DC
  Administered 2023-03-24 – 2023-03-25 (×2): 1 g via INTRAVENOUS
  Filled 2023-03-23 (×2): qty 10

## 2023-03-23 MED ORDER — FUROSEMIDE 10 MG/ML IJ SOLN
40.0000 mg | Freq: Once | INTRAMUSCULAR | Status: AC
  Administered 2023-03-23: 40 mg via INTRAVENOUS
  Filled 2023-03-23: qty 4

## 2023-03-23 MED ORDER — SODIUM CHLORIDE 0.9 % IV SOLN
500.0000 mg | INTRAVENOUS | Status: DC
  Administered 2023-03-24 – 2023-03-25 (×2): 500 mg via INTRAVENOUS
  Filled 2023-03-23 (×3): qty 5

## 2023-03-23 MED ORDER — IOHEXOL 350 MG/ML SOLN
80.0000 mL | Freq: Once | INTRAVENOUS | Status: AC | PRN
  Administered 2023-03-23: 80 mL via INTRAVENOUS

## 2023-03-23 MED ORDER — ACETAMINOPHEN 325 MG PO TABS
650.0000 mg | ORAL_TABLET | Freq: Four times a day (QID) | ORAL | Status: DC | PRN
  Administered 2023-03-24: 650 mg via ORAL
  Filled 2023-03-23: qty 2

## 2023-03-23 MED ORDER — SODIUM CHLORIDE 0.9 % IV BOLUS: 1000.0000 mL | Freq: Once | INTRAVENOUS | Status: DC

## 2023-03-23 MED ORDER — MELATONIN 3 MG PO TABS: 3.0000 mg | ORAL_TABLET | Freq: Every evening | ORAL | Status: DC | PRN

## 2023-03-23 MED ORDER — LORAZEPAM 0.5 MG PO TABS
0.5000 mg | ORAL_TABLET | Freq: Once | ORAL | Status: AC
  Administered 2023-03-23: 0.5 mg via ORAL
  Filled 2023-03-23: qty 1

## 2023-03-23 MED ORDER — ACETAMINOPHEN 650 MG RE SUPP: 650.0000 mg | Freq: Four times a day (QID) | RECTAL | Status: DC | PRN

## 2023-03-23 MED ORDER — ONDANSETRON HCL 4 MG/2ML IJ SOLN: 4.0000 mg | Freq: Four times a day (QID) | INTRAMUSCULAR | Status: DC | PRN

## 2023-03-23 MED ORDER — SODIUM CHLORIDE 0.9 % IV SOLN
500.0000 mg | Freq: Once | INTRAVENOUS | Status: AC
  Administered 2023-03-24: 500 mg via INTRAVENOUS
  Filled 2023-03-23: qty 5

## 2023-03-23 MED ORDER — POTASSIUM CHLORIDE CRYS ER 20 MEQ PO TBCR
40.0000 meq | EXTENDED_RELEASE_TABLET | Freq: Once | ORAL | Status: AC
  Administered 2023-03-24: 40 meq via ORAL
  Filled 2023-03-23: qty 2

## 2023-03-23 NOTE — H&P (Signed)
History and Physical      Leah Diaz ZHY:[id number] DOB: 1960-11-29 DOA: 03/23/2023; DOS: 03/23/2023  PCP: Juliette Alcide, MD *** Patient coming from: home ***  I have personally briefly reviewed patient's old medical records in [facility]  Chief Complaint: ***  HPI: Leah Diaz is a 62 y.o. female with medical history significant for *** who is admitted to [facility] on 03/23/2023 with *** after presenting from home*** to [facility] ED complaining of ***.   ***        ***  ED Course:  Vital signs in the ED were notable for the following: ***  Labs were notable for the following: ***  Per my interpretation, EKG in ED demonstrated the following:  ***  Imaging and additional notable ED work-up: ***  While in the ED, the following were administered: ***  Subsequently, the patient was admitted  ***  ***red   Review of Systems: As per HPI otherwise 10 point review of systems negative.   Past Medical History:  Diagnosis Date   Anemia    Arthritis    Asthma    remote history as a teenager   GERD (gastroesophageal reflux disease)    Hypertension    Menopausal symptoms 05/05/2011   Migraines 05/05/2011   Seasonal allergies    Uterine sarcoma (HCC)     Past Surgical History:  Procedure Laterality Date   ABDOMINAL HYSTERECTOMY     BREAST BIOPSY     x 2 benign   COLONOSCOPY N/A 09/18/2021   Procedure: COLONOSCOPY;  Surgeon: Malissa Hippo, MD;  Location: [facility];  Service: Endoscopy;  Laterality: N/A;  8:05   HYSTEROSCOPY  11/06/2020   IR IMAGING GUIDED PORT INSERTION  04/08/2022   ROBOTIC ASSISTED TOTAL HYSTERECTOMY WITH BILATERAL SALPINGO OOPHERECTOMY N/A 06/03/2021   Procedure: XI ROBOTIC ASSISTED TOTAL HYSTERECTOMY FOR UTERUS GREATER THAN 250G WITH BILATERAL SALPINGO OOPHORECTOMY;  Surgeon: Adolphus Birchwood, MD;  Location: [facility];  Service: Gynecology;  Laterality: N/A;   TUBAL LIGATION  05/05/2011    Social History:  reports that she  has never smoked. She has never used smokeless tobacco. She reports that she does not drink alcohol and does not use drugs.   No Known Allergies  Family History  Problem Relation Age of Onset   Colon cancer Maternal Uncle    Pancreatic cancer Maternal Grandmother     Family history reviewed and not pertinent ***   Prior to Admission medications   Medication Sig Start Date End Date Taking? Authorizing Provider  acetaminophen (TYLENOL) 500 MG tablet Take 500 mg by mouth every 6 (six) hours as needed for mild pain, moderate pain or fever.    [provider]  aspirin 81 MG EC tablet Take by mouth daily.    [provider]  cetirizine (ZYRTEC) 10 MG tablet Take 10 mg by mouth daily.    [provider]  Cholecalciferol (VITAMIN D3 PO) Take 1 tablet by mouth daily.    [provider]  exemestane (AROMASIN) 25 MG tablet Take 1 tablet (25 mg total) by mouth daily after breakfast. 03/18/23   Artis Delay, MD  furosemide (LASIX) 20 MG tablet Take 1 tablet (20 mg total) by mouth daily. 11/20/22   Artis Delay, MD  lidocaine-prilocaine (EMLA) cream Apply 1 Application topically as needed. 03/04/23   Artis Delay, MD  LORazepam (ATIVAN) 0.5 MG tablet Take 1 tablet (0.5 mg total) by mouth 2 (two) times daily as needed for anxiety. 02/04/23  Artis Delay, MD  omeprazole (PRILOSEC) 20 MG capsule Take 1 capsule (20 mg total) by mouth 2 (two) times daily. 10/07/11 08/22/22  Setzer, Brand Males, NP  ondansetron (ZOFRAN) 8 MG tablet Take 1 tablet (8 mg total) by mouth every 8 (eight) hours as needed for nausea. 09/03/22   Artis Delay, MD  prochlorperazine (COMPAZINE) 10 MG tablet Take 1 tablet (10 mg total) by mouth every 6 (six) hours as needed (Nausea or vomiting). 05/28/22 06/15/22  Artis Delay, MD     Objective    Physical Exam: Vitals:   03/23/23 1942 03/23/23 1945 03/23/23 1946 03/23/23 2221  BP:  120/82 120/82 122/80  Pulse: (!) 137 (!) 137  (!) 124  Resp: 19 19  18    Temp:  98.1 F (36.7 C)  98.3 F (36.8 C)  TempSrc:  Oral  Oral  SpO2: 99% 100%  100%    General: appears to be stated age; alert, oriented Skin: warm, dry, no rash Head:  AT/[place] Mouth:  Oral mucosa membranes appear moist, normal dentition Neck: supple; trachea midline Heart:  RRR; did not appreciate any M/R/G Lungs: CTAB, did not appreciate any wheezes, rales, or rhonchi Abdomen: + BS; soft, ND, NT Vascular: 2+ pedal pulses b/l; 2+ radial pulses b/l Extremities: no peripheral edema, no muscle wasting Neuro: strength and sensation intact in upper and lower extremities b/l    *** Neuro: 5/5 strength of the proximal and distal flexors and extensors of the upper and lower extremities bilaterally; sensation intact in upper and lower extremities b/l; cranial nerves II through XII grossly intact; no pronator drift; no evidence suggestive of slurred speech, dysarthria, or facial droop; Normal muscle tone. No tremors. *** Neuro: In the setting of the patient's current mental status and associated inability to follow instructions, unable to perform full neurologic exam at this time.  As such, assessment of strength, sensation, and cranial nerves is limited at this time. Patient noted to spontaneously move all 4 extremities. No tremors.  ***    Labs on Admission: I have personally reviewed following labs and imaging studies  CBC: Recent Labs  Lab 03/18/23 0738 03/23/23 1944  WBC 4.9 6.6  NEUTROABS 2.2 3.1  HGB 10.2* 10.6*  HCT 30.8* 32.8*  MCV 94.2 93.2  PLT 527* 636*   Basic Metabolic Panel: Recent Labs  Lab 03/18/23 0738 03/23/23 1944  NA 134* 131*  K 3.9 3.2*  CL 101 97*  CO2 26 24  GLUCOSE 108* 116*  BUN 16 16  CREATININE 0.85 1.06*  CALCIUM 8.8* 8.4*   GFR: Estimated Creatinine Clearance: 60.9 mL/min (A) (by C-G formula based on SCr of 1.06 mg/dL (H)). Liver Function Tests: Recent Labs  Lab 03/18/23 0738 03/23/23 1944  AST 17 18  ALT 9 12  ALKPHOS 69 61   BILITOT 0.4 0.6  PROT 5.7* 5.8*  ALBUMIN 3.1* 2.8*   No results for input(s): "LIPASE", "AMYLASE" in the last 168 hours. No results for input(s): "AMMONIA" in the last 168 hours. Coagulation Profile: No results for input(s): "INR", "PROTIME" in the last 168 hours. Cardiac Enzymes: No results for input(s): "CKTOTAL", "CKMB", "CKMBINDEX", "TROPONINI" in the last 168 hours. BNP (last 3 results) No results for input(s): "PROBNP" in the last 8760 hours. HbA1C: No results for input(s): "HGBA1C" in the last 72 hours. CBG: No results for input(s): "GLUCAP" in the last 168 hours. Lipid Profile: No results for input(s): "CHOL", "HDL", "LDLCALC", "TRIG", "CHOLHDL", "LDLDIRECT" in the last 72 hours. Thyroid Function Tests: No  results for input(s): "TSH", "T4TOTAL", "FREET4", "T3FREE", "THYROIDAB" in the last 72 hours. Anemia Panel: No results for input(s): "VITAMINB12", "FOLATE", "FERRITIN", "TIBC", "IRON", "RETICCTPCT" in the last 72 hours. Urine analysis:    Component Value Date/Time   COLORURINE STRAW (A) 02/04/2023 0911   APPEARANCEUR CLEAR 02/04/2023 0911   LABSPEC 1.010 02/04/2023 0911   PHURINE 7.0 02/04/2023 0911   GLUCOSEU NEGATIVE 02/04/2023 0911   HGBUR MODERATE (A) 02/04/2023 0911   BILIRUBINUR NEGATIVE 02/04/2023 0911   KETONESUR NEGATIVE 02/04/2023 0911   PROTEINUR 30 (A) 02/04/2023 0911   NITRITE NEGATIVE 02/04/2023 0911   LEUKOCYTESUR SMALL (A) 02/04/2023 0911    Radiological Exams on Admission: CT Angio Chest PE W and/or Wo Contrast  Result Date: 03/23/2023 CLINICAL DATA:  Severe shortness of breath. EXAM: CT ANGIOGRAPHY CHEST WITH CONTRAST TECHNIQUE: Multidetector CT imaging of the chest was performed using the standard protocol during bolus administration of intravenous contrast. Multiplanar CT image reconstructions and MIPs were obtained to evaluate the vascular anatomy. RADIATION DOSE REDUCTION: This exam was performed according to the departmental dose-optimization  program which includes automated exposure control, adjustment of the mA and/or kV according to patient size and/or use of iterative reconstruction technique. CONTRAST:  80mL OMNIPAQUE IOHEXOL 350 MG/ML SOLN COMPARISON:  CT chest abdomen and pelvis 03/02/2023 FINDINGS: Cardiovascular: Satisfactory opacification of the pulmonary arteries to the segmental level. No evidence of pulmonary embolism. Normal heart size. No pericardial effusion. Right chest port catheter tip ends in the SVC. Mediastinum/Nodes: No enlarged mediastinal, hilar, or axillary lymph nodes. Thyroid gland, trachea, and esophagus demonstrate no significant findings. Lungs/Pleura: There are moderate bilateral pleural effusions. Multifocal ground-glass opacities are seen throughout both lungs. There is some peripheral smooth interlobular septal thickening. Minimal patchy slightly nodular airspace opacities are seen in the central lower lobes bilaterally. There is no pneumothorax. Upper Abdomen: No acute abnormality. Musculoskeletal: No chest wall abnormality. No acute or significant osseous findings. Review of the MIP images confirms the above findings. IMPRESSION: 1. No evidence for pulmonary embolism. 2. Moderate bilateral pleural effusions. 3. Multifocal ground-glass opacities throughout both lungs with peripheral smooth interlobular septal thickening. Findings are favored as pulmonary edema. Atypical infection can not be excluded. 4. Minimal patchy slightly nodular airspace opacities in the central lower lobes may be infectious/inflammatory. Electronically Signed   By: Darliss Cheney M.D.   On: 03/23/2023 23:02   DG Chest Portable 1 View  Result Date: 03/23/2023 CLINICAL DATA:  Shortness of breath. History of cervical cancer. EXAM: PORTABLE CHEST 1 VIEW COMPARISON:  Chest radiograph earlier today. Chest CT 03/02/2023 FINDINGS: Right chest port remains in place. The heart is normal in size. Stable mediastinal contours. There are patchy bilateral  pulmonary opacities. Question of small pleural effusions. Normal pulmonary vasculature. No pneumothorax. IMPRESSION: Patchy bilateral pulmonary opacities, suspicious for pneumonia. Electronically Signed   By: Narda Rutherford M.D.   On: 03/23/2023 20:59      Assessment/Plan    Principal Problem:   Acute hypoxic respiratory failure (HCC)  ***      ***          ***           ***          ***          ***          ***          ***          ***          ***          ***          ***          ***          ***  DVT prophylaxis: SCD's ***  Code Status: Full code*** Family Communication: none*** Disposition Plan: Per Rounding Team Consults called: none***;  Admission status: ***    I SPENT GREATER THAN 75 *** MINUTES IN CLINICAL CARE TIME/MEDICAL DECISION-MAKING IN COMPLETING THIS ADMISSION.     Chaney Born Andrell Bergeson DO [facility] From 7PM - 7AM   03/23/2023, 11:27 PM   ***

## 2023-03-23 NOTE — Telephone Encounter (Signed)
Called [name] back and advised her of message from Dr. Bertis Ruddy.  She is going to call her PCP and try to get an appointment for this afternoon.

## 2023-03-23 NOTE — ED Provider Notes (Signed)
[place] EMERGENCY DEPARTMENT AT [facility] Provider Note   CSN: [id number] Arrival date & time: 03/23/23  1931    History  Chief Complaint  Patient presents with   Shortness of Breath    Leah Diaz is a 62 y.o. female history of uterine sarcoma, recently taken off of chemotherapy due to lower extremity swelling here for evaluation of cough, shortness of breath, palpitations and weakness.  Cough worse at night.  She gets out of breath ambulating from room to room and has to sit down and rest.  She has been taking Lasix 20 mg daily for lower extremity swelling.  She denies any prior history of heart failure.  No history of PE or DVT.  Cough productive of green sputum.  No sick contacts.  No fever, nausea, vomiting, chest pain, abdominal pain.  No numbness or weakness.  HPI     Home Medications Prior to Admission medications   Medication Sig Start Date End Date Taking? Authorizing Provider  acetaminophen (TYLENOL) 500 MG tablet Take 500 mg by mouth every 6 (six) hours as needed for mild pain, moderate pain or fever.    [provider]  aspirin 81 MG EC tablet Take by mouth daily.    [provider]  cetirizine (ZYRTEC) 10 MG tablet Take 10 mg by mouth daily.    [provider]  Cholecalciferol (VITAMIN D3 PO) Take 1 tablet by mouth daily.    [provider]  exemestane (AROMASIN) 25 MG tablet Take 1 tablet (25 mg total) by mouth daily after breakfast. 03/18/23   Artis Delay, MD  furosemide (LASIX) 20 MG tablet Take 1 tablet (20 mg total) by mouth daily. 11/20/22   Artis Delay, MD  lidocaine-prilocaine (EMLA) cream Apply 1 Application topically as needed. 03/04/23   Artis Delay, MD  LORazepam (ATIVAN) 0.5 MG tablet Take 1 tablet (0.5 mg total) by mouth 2 (two) times daily as needed for anxiety. 02/04/23   Artis Delay, MD  omeprazole (PRILOSEC) 20 MG capsule Take 1 capsule (20 mg total) by mouth 2 (two) times daily. 10/07/11 08/22/22  Setzer,  Brand Males, NP  ondansetron (ZOFRAN) 8 MG tablet Take 1 tablet (8 mg total) by mouth every 8 (eight) hours as needed for nausea. 09/03/22   Artis Delay, MD  prochlorperazine (COMPAZINE) 10 MG tablet Take 1 tablet (10 mg total) by mouth every 6 (six) hours as needed (Nausea or vomiting). 05/28/22 06/15/22  Artis Delay, MD      Allergies    Patient has no known allergies.    Review of Systems   Review of Systems  Constitutional: Negative.   HENT: Negative.    Respiratory:  Positive for cough and shortness of breath.   Cardiovascular:  Positive for palpitations. Negative for chest pain and leg swelling.  Gastrointestinal: Negative.   Genitourinary: Negative.   Musculoskeletal: Negative.   Skin: Negative.   Neurological: Negative.   All other systems reviewed and are negative.   Physical Exam Updated Vital Signs BP 122/80 (BP Location: Left Arm)   Pulse (!) 124   Temp 98.3 F (36.8 C) (Oral)   Resp 18   SpO2 100%  Physical Exam Vitals and nursing note reviewed.  Constitutional:      General: She is not in acute distress.    Appearance: She is well-developed. She is ill-appearing. She is not toxic-appearing or diaphoretic.  HENT:     Head: Atraumatic.  Eyes:     Pupils: Pupils are equal, round,  and reactive to light.  Cardiovascular:     Rate and Rhythm: Tachycardia present.     Pulses: Normal pulses.          Radial pulses are 2+ on the right side and 2+ on the left side.       Dorsalis pedis pulses are 2+ on the right side and 2+ on the left side.     Heart sounds: Normal heart sounds.  Pulmonary:     Effort: Pulmonary effort is normal. No respiratory distress.     Breath sounds: Decreased air movement present. Decreased breath sounds and rhonchi present. No wheezing or rales.     Comments: Decreased movement at the bases bilaterally Abdominal:     General: Bowel sounds are normal. There is no distension.     Palpations: Abdomen is soft.     Tenderness: There is no  abdominal tenderness. There is no guarding or rebound.  Musculoskeletal:        General: Normal range of motion.     Cervical back: Normal range of motion.     Right lower leg: No tenderness. Edema present.     Left lower leg: No tenderness. Edema present.     Comments: 2 Plus pitting edema bilateral lower extremities, no erythema or warmth.  No bony tenderness, compartments soft  Skin:    General: Skin is warm and dry.     Capillary Refill: Capillary refill takes less than 2 seconds.  Neurological:     General: No focal deficit present.     Mental Status: She is alert.     Cranial Nerves: Cranial nerves 2-12 are intact.     Sensory: Sensation is intact.     Motor: Motor function is intact.     Gait: Gait is intact.  Psychiatric:        Mood and Affect: Mood normal.     ED Results / Procedures / Treatments   Labs (all labs ordered are listed, but only abnormal results are displayed) Labs Reviewed  COMPREHENSIVE METABOLIC PANEL - Abnormal; Notable for the following components:      Result Value   Sodium 131 (*)    Potassium 3.2 (*)    Chloride 97 (*)    Glucose, Bld 116 (*)    Creatinine, Ser 1.06 (*)    Calcium 8.4 (*)    Total Protein 5.8 (*)    Albumin 2.8 (*)    GFR, Estimated 59 (*)    All other components within normal limits  CBC WITH DIFFERENTIAL/PLATELET - Abnormal; Notable for the following components:   RBC 3.52 (*)    Hemoglobin 10.6 (*)    HCT 32.8 (*)    RDW 22.8 (*)    Platelets 636 (*)    Monocytes Absolute 1.4 (*)    All other components within normal limits  CULTURE, BLOOD (ROUTINE X 2)  CULTURE, BLOOD (ROUTINE X 2)  BRAIN NATRIURETIC PEPTIDE  LACTIC ACID, PLASMA  LACTIC ACID, PLASMA  TROPONIN I (HIGH SENSITIVITY)  TROPONIN I (HIGH SENSITIVITY)    EKG EKG Interpretation  Date/Time:  Tuesday Mar 23 2023 19:43:05 EDT Ventricular Rate:  129 PR Interval:  132 QRS Duration: 79 QT Interval:  295 QTC Calculation: 433 R Axis:   66 Text  Interpretation: Sinus tachycardia Low voltage, precordial leads Borderline T abnormalities, anterior leads Confirmed by Alona Bene [phone]) on 03/23/2023 7:46:24 PM  Radiology DG Chest Portable 1 View  Result Date: 03/23/2023 CLINICAL DATA:  Shortness of breath. History  of cervical cancer. EXAM: PORTABLE CHEST 1 VIEW COMPARISON:  Chest radiograph earlier today. Chest CT 03/02/2023 FINDINGS: Right chest port remains in place. The heart is normal in size. Stable mediastinal contours. There are patchy bilateral pulmonary opacities. Question of small pleural effusions. Normal pulmonary vasculature. No pneumothorax. IMPRESSION: Patchy bilateral pulmonary opacities, suspicious for pneumonia. Electronically Signed   By: Narda Rutherford M.D.   On: 03/23/2023 20:59    Procedures .Critical Care  Performed by: Linwood Dibbles, PA-C Authorized by: Linwood Dibbles, PA-C   Critical care provider statement:    Critical care time (minutes):  35   Critical care was necessary to treat or prevent imminent or life-threatening deterioration of the following conditions:  Respiratory failure   Critical care was time spent personally by me on the following activities:  Development of treatment plan with patient or surrogate, discussions with consultants, evaluation of patient's response to treatment, examination of patient, ordering and review of laboratory studies, ordering and review of radiographic studies, ordering and performing treatments and interventions, pulse oximetry, re-evaluation of patient's condition and review of old charts     Medications Ordered in ED Medications  LORazepam (ATIVAN) tablet 0.5 mg (0.5 mg Oral Given 03/23/23 2150)  iohexol (OMNIPAQUE) 350 MG/ML injection 80 mL (80 mLs Intravenous Contrast Given 03/23/23 2241)    ED Course/ Medical Decision Making/ A&P    Patient with known history of uterine sarcoma, recently taken off of chemotherapy due to side effects here for evaluation of  hypoxia.  Has had cough, shortness of breath and weakness over the last few days.  She is compliant with her home diuretic.  Was seen by her PCP office who had chest x-ray done who was concerned about blood clots.  She was hypoxic with PCP.  On arrival patient was hypoxic to 84%, tachycardic and tachypneic.  She denies any pain.  She does have some lower extremity swelling her compartments soft, neurovascularly intact.  She does have some mild adventitious lung sounds.  Will plan on labs, imaging, reassess.  Patient placed on 2 L via nasal cannula with improvement in respiratory status  Labs and imaging personally viewed and interpreted:  CBC without leukocytosis, hemoglobin 10.6 Metabolic panel sodium 131, potassium 3.2, creatinine 1.06 Troponin 12 BNP 95 X-ray with possible bilateral pneumonia  Patient got up to use the restroom and became extremely dyspneic.  She is awaiting CT angio to rule out PE given her history of cancer, new hypoxia.  Will need admission.  CTA with possible developing pneumonia, pulmonary edema as well as bilateral moderate effusions  I discussed results with patient in room.  Still requiring 3 L via nasal cannula.  She is tachycardic however will hold on additional fluids to be discussed with the hospitalist given her edema.  CONSULT with Dr. Arlean Hopping with [facility] who is agreeable to evaluate patient for admission  The patient appears reasonably stabilized for admission considering the current resources, flow, and capabilities available in the ED at this time, and I doubt any other [facility] requiring further screening and/or treatment in the ED prior to admission.                             Medical Decision Making Amount and/or Complexity of Data Reviewed Independent Historian: spouse External Data Reviewed: labs, radiology, ECG and notes. Labs: ordered. Decision-making details documented in ED Course. Radiology: ordered and independent interpretation performed.  Decision-making details documented in ED  Course. ECG/medicine tests: ordered and independent interpretation performed. Decision-making details documented in ED Course.  Risk OTC drugs. Prescription drug management. Parenteral controlled substances. Decision regarding hospitalization. Diagnosis or treatment significantly limited by social determinants of health.          Final Clinical Impression(s) / ED Diagnoses Final diagnoses:  Acute respiratory failure with hypoxia (HCC)  Malignant neoplasm of uterus, unspecified site (HCC)  Tachycardia  Acute pulmonary edema (HCC)  Pleural effusion    Rx / DC Orders ED Discharge Orders     None         Zaina Jenkin A, PA-C 03/23/23 2335    Derwood Kaplan, MD 03/26/23 1405

## 2023-03-23 NOTE — Telephone Encounter (Signed)
[name] called and reported that she continues to cough at night and also feels worn out - she gets out of breath walking from room to room and has to sit down and rest.  She is taking lasix 20 mg daily and a 1/2 tablet of her blood pressure medication (losartan-hctz).  She is wondering if she needs to increase her fluid pill or if prednisone would help.  She is also wondering if she needs to see her family doctor for this.

## 2023-03-23 NOTE — ED Triage Notes (Addendum)
Patient arrives POV with shortness of breath and elevated HR. Patient was sent from her PCP office. Patient had a chest xray done today and her PCP was concerned about blood clots. Patient is currently being treated for cancer. Patient states the shortness of breath began a few days ago and has gotten worse. Patient was 84% of room air while sitting with a heart rate of 137. No complaints of chest pain. Patient does not wear oxygen at home but was placed on oxygen via nasal cannula while in triage. Patient has a port.

## 2023-03-23 NOTE — Telephone Encounter (Signed)
I cannot explain her symptoms; she should see her PCP She should get better after stopping chemo, not worse

## 2023-03-24 ENCOUNTER — Inpatient Hospital Stay (HOSPITAL_COMMUNITY): Payer: Managed Care, Other (non HMO)

## 2023-03-24 ENCOUNTER — Encounter (HOSPITAL_COMMUNITY): Payer: Self-pay | Admitting: Internal Medicine

## 2023-03-24 DIAGNOSIS — J189 Pneumonia, unspecified organism: Secondary | ICD-10-CM | POA: Diagnosis present

## 2023-03-24 DIAGNOSIS — I5033 Acute on chronic diastolic (congestive) heart failure: Secondary | ICD-10-CM | POA: Diagnosis not present

## 2023-03-24 DIAGNOSIS — E876 Hypokalemia: Secondary | ICD-10-CM | POA: Insufficient documentation

## 2023-03-24 DIAGNOSIS — E871 Hypo-osmolality and hyponatremia: Secondary | ICD-10-CM | POA: Insufficient documentation

## 2023-03-24 DIAGNOSIS — E872 Acidosis, unspecified: Secondary | ICD-10-CM | POA: Insufficient documentation

## 2023-03-24 DIAGNOSIS — J309 Allergic rhinitis, unspecified: Secondary | ICD-10-CM | POA: Insufficient documentation

## 2023-03-24 DIAGNOSIS — R000 Tachycardia, unspecified: Secondary | ICD-10-CM | POA: Diagnosis not present

## 2023-03-24 DIAGNOSIS — J90 Pleural effusion, not elsewhere classified: Secondary | ICD-10-CM | POA: Insufficient documentation

## 2023-03-24 DIAGNOSIS — D638 Anemia in other chronic diseases classified elsewhere: Secondary | ICD-10-CM | POA: Insufficient documentation

## 2023-03-24 DIAGNOSIS — J9601 Acute respiratory failure with hypoxia: Secondary | ICD-10-CM | POA: Diagnosis not present

## 2023-03-24 LAB — COMPREHENSIVE METABOLIC PANEL
ALT: 11 U/L (ref 0–44)
AST: 18 U/L (ref 15–41)
Albumin: 2.8 g/dL — ABNORMAL LOW (ref 3.5–5.0)
Alkaline Phosphatase: 66 U/L (ref 38–126)
Anion gap: 7 (ref 5–15)
BUN: 15 mg/dL (ref 8–23)
CO2: 25 mmol/L (ref 22–32)
Calcium: 8.2 mg/dL — ABNORMAL LOW (ref 8.9–10.3)
Chloride: 100 mmol/L (ref 98–111)
Creatinine, Ser: 0.82 mg/dL (ref 0.44–1.00)
GFR, Estimated: >60 mL/min (ref >60)
Glucose, Bld: 100 mg/dL — ABNORMAL HIGH (ref 70–99)
Potassium: 3.3 mmol/L — ABNORMAL LOW (ref 3.5–5.1)
Sodium: 132 mmol/L — ABNORMAL LOW (ref 135–145)
Total Bilirubin: 0.5 mg/dL (ref 0.3–1.2)
Total Protein: 5.8 g/dL — ABNORMAL LOW (ref 6.5–8.1)

## 2023-03-24 LAB — STREP PNEUMONIAE URINARY ANTIGEN: Strep Pneumo Urinary Antigen: NEGATIVE

## 2023-03-24 LAB — CBC WITH DIFFERENTIAL/PLATELET
Abs Immature Granulocytes: 0.02 10*3/uL (ref 0.00–0.07)
Basophils Absolute: 0.1 10*3/uL (ref 0.0–0.1)
Basophils Relative: 2 %
Eosinophils Absolute: 0.6 10*3/uL — ABNORMAL HIGH (ref 0.0–0.5)
Eosinophils Relative: 10 %
HCT: 31.4 % — ABNORMAL LOW (ref 36.0–46.0)
Hemoglobin: 10.4 g/dL — ABNORMAL LOW (ref 12.0–15.0)
Immature Granulocytes: 0 %
Lymphocytes Relative: 29 %
Lymphs Abs: 1.6 10*3/uL (ref 0.7–4.0)
MCH: 31.3 pg (ref 26.0–34.0)
MCHC: 33.1 g/dL (ref 30.0–36.0)
MCV: 94.6 fL (ref 80.0–100.0)
Monocytes Absolute: 1.2 10*3/uL — ABNORMAL HIGH (ref 0.1–1.0)
Monocytes Relative: 22 %
Neutro Abs: 2 10*3/uL (ref 1.7–7.7)
Neutrophils Relative %: 37 %
Platelets: 601 10*3/uL — ABNORMAL HIGH (ref 150–400)
RBC: 3.32 MIL/uL — ABNORMAL LOW (ref 3.87–5.11)
RDW: 22.5 % — ABNORMAL HIGH (ref 11.5–15.5)
WBC: 5.4 10*3/uL (ref 4.0–10.5)
nRBC: 0 % (ref 0.0–0.2)

## 2023-03-24 LAB — URINALYSIS, COMPLETE (UACMP) WITH MICROSCOPIC
Bacteria, UA: NONE SEEN
Bilirubin Urine: NEGATIVE
Glucose, UA: NEGATIVE mg/dL
Ketones, ur: NEGATIVE mg/dL
Leukocytes,Ua: NEGATIVE
Nitrite: NEGATIVE
Protein, ur: NEGATIVE mg/dL
Specific Gravity, Urine: 1.033 — ABNORMAL HIGH (ref 1.005–1.030)
pH: 6 (ref 5.0–8.0)

## 2023-03-24 LAB — OSMOLALITY: Osmolality: 280 mOsm/kg (ref 275–295)

## 2023-03-24 LAB — PROTIME-INR
INR: 1.1 (ref 0.8–1.2)
Prothrombin Time: 14.5 seconds (ref 11.4–15.2)

## 2023-03-24 LAB — ECHOCARDIOGRAM COMPLETE
Height: 67.5 in
S' Lateral: 3 cm
Weight: 2912 oz

## 2023-03-24 LAB — PROCALCITONIN: Procalcitonin: <0.1 ng/mL

## 2023-03-24 LAB — CULTURE, BLOOD (ROUTINE X 2)

## 2023-03-24 LAB — BLOOD GAS, VENOUS
Acid-Base Excess: 1.7 mmol/L (ref 0.0–2.0)
Bicarbonate: 27.7 mmol/L (ref 20.0–28.0)
O2 Saturation: 75.9 %
Patient temperature: 37
pCO2, Ven: 48 mmHg (ref 44–60)
pH, Ven: 7.37 (ref 7.25–7.43)
pO2, Ven: 49 mmHg — ABNORMAL HIGH (ref 32–45)

## 2023-03-24 LAB — CREATININE, URINE, RANDOM: Creatinine, Urine: 83 mg/dL

## 2023-03-24 LAB — TSH: TSH: 4.757 u[IU]/mL — ABNORMAL HIGH (ref 0.350–4.500)

## 2023-03-24 LAB — OSMOLALITY, URINE: Osmolality, Ur: 377 mOsm/kg (ref 300–900)

## 2023-03-24 LAB — SODIUM, URINE, RANDOM: Sodium, Ur: 27 mmol/L

## 2023-03-24 LAB — LACTIC ACID, PLASMA: Lactic Acid, Venous: 1.8 mmol/L (ref 0.5–1.9)

## 2023-03-24 LAB — MAGNESIUM: Magnesium: 2.2 mg/dL (ref 1.7–2.4)

## 2023-03-24 LAB — TROPONIN I (HIGH SENSITIVITY): Troponin I (High Sensitivity): 14 ng/L (ref <18)

## 2023-03-24 LAB — PHOSPHORUS: Phosphorus: 4.9 mg/dL — ABNORMAL HIGH (ref 2.5–4.6)

## 2023-03-24 MED ORDER — BENZONATATE 100 MG PO CAPS: 200.0000 mg | ORAL_CAPSULE | Freq: Three times a day (TID) | ORAL | Status: DC | PRN

## 2023-03-24 MED ORDER — LORATADINE 10 MG PO TABS
10.0000 mg | ORAL_TABLET | Freq: Every day | ORAL | Status: DC
  Administered 2023-03-24 – 2023-03-26 (×3): 10 mg via ORAL
  Filled 2023-03-24 (×3): qty 1

## 2023-03-24 MED ORDER — METOPROLOL TARTRATE 25 MG PO TABS
12.5000 mg | ORAL_TABLET | Freq: Two times a day (BID) | ORAL | Status: DC
  Administered 2023-03-24 – 2023-03-26 (×5): 12.5 mg via ORAL
  Filled 2023-03-24 (×5): qty 1

## 2023-03-24 MED ORDER — LORAZEPAM 0.5 MG PO TABS: 0.5000 mg | ORAL_TABLET | Freq: Two times a day (BID) | ORAL | Status: DC | PRN

## 2023-03-24 MED ORDER — POTASSIUM CHLORIDE CRYS ER 20 MEQ PO TBCR: 40.0000 meq | EXTENDED_RELEASE_TABLET | Freq: Once | ORAL | Status: DC

## 2023-03-24 MED ORDER — FUROSEMIDE 10 MG/ML IJ SOLN
40.0000 mg | Freq: Every day | INTRAMUSCULAR | Status: DC
  Administered 2023-03-25 – 2023-03-26 (×2): 40 mg via INTRAVENOUS
  Filled 2023-03-24 (×2): qty 4

## 2023-03-24 MED ORDER — FUROSEMIDE 10 MG/ML IJ SOLN
40.0000 mg | Freq: Two times a day (BID) | INTRAMUSCULAR | Status: DC
  Administered 2023-03-24: 40 mg via INTRAVENOUS
  Filled 2023-03-24: qty 4

## 2023-03-24 MED ORDER — POTASSIUM CHLORIDE CRYS ER 20 MEQ PO TBCR
40.0000 meq | EXTENDED_RELEASE_TABLET | Freq: Once | ORAL | Status: AC
  Administered 2023-03-24: 40 meq via ORAL
  Filled 2023-03-24: qty 2

## 2023-03-24 MED ORDER — PANTOPRAZOLE SODIUM 40 MG PO TBEC
40.0000 mg | DELAYED_RELEASE_TABLET | Freq: Every day | ORAL | Status: DC
  Administered 2023-03-24 – 2023-03-26 (×3): 40 mg via ORAL
  Filled 2023-03-24 (×3): qty 1

## 2023-03-24 MED ORDER — MAGNESIUM SULFATE 2 GM/50ML IV SOLN
2.0000 g | Freq: Once | INTRAVENOUS | Status: AC
  Administered 2023-03-24: 2 g via INTRAVENOUS
  Filled 2023-03-24: qty 50

## 2023-03-24 MED ORDER — EXEMESTANE 25 MG PO TABS
25.0000 mg | ORAL_TABLET | Freq: Every day | ORAL | Status: DC
  Administered 2023-03-24 – 2023-03-26 (×3): 25 mg via ORAL
  Filled 2023-03-24 (×3): qty 1

## 2023-03-24 MED ORDER — CHLORHEXIDINE GLUCONATE CLOTH 2 % EX PADS
6.0000 | MEDICATED_PAD | Freq: Every day | CUTANEOUS | Status: DC
  Administered 2023-03-24 – 2023-03-26 (×3): 6 via TOPICAL

## 2023-03-24 MED ORDER — SENNOSIDES-DOCUSATE SODIUM 8.6-50 MG PO TABS
1.0000 | ORAL_TABLET | Freq: Every evening | ORAL | Status: DC | PRN
  Administered 2023-03-24: 1 via ORAL
  Filled 2023-03-24: qty 1

## 2023-03-24 NOTE — Hospital Course (Addendum)
Leah Diaz is a 62 y.o. female with medical history significant for uterine sarcoma, anemia of chronic disease associated baseline hemoglobin 9-11, essential hypertension, chronic diastolic heart failure, who was admitted to [facility] on 03/23/2023 with acute on chronic diastolic heart failure after presenting from home to [facility] ED complaining of shortness of breath, worsening lower extremity edema after having been recently started on Lasix 20 mg daily by her oncologist.  On presentation, 88% on room air and  placed on 2 L of oxygen. Chest x-ray with bilateral airspace opacities concerning for pneumonia.  CTA with no evidence of PE, demonstrating moderate bilateral pleural effusion, multifocal groundglass opacities bilaterally, which appears to favor pulmonary edema, also showing patchy airspace opacities in the central lower lobe concerning for infection.  She received IV Lasix and started on Rocephin and Zithromax.  TSH mildly elevated which will need repeat check in couple of weeks by PCP.  Blood cultures negative to date.  Procalcitonin negative.  Clinically appears euvolemic with normal BNP and echocardiogram with grade 1 diastolic dysfunction only.  IVC was normal.  Decreased the dose of Lasix to daily and also added low-dose metoprolol to help with heart rate.  Also concern of disease progression has she already had history of lung mets. Persistent desaturation to 79% with ambulation requiring 2 L of oxygen.  Home oxygen was ordered.

## 2023-03-24 NOTE — Assessment & Plan Note (Signed)
Per patient her heart rate is up since she was started on chemo. TSH mildly elevated. -Ordered free T4 -Add low-dose metoprolol

## 2023-03-24 NOTE — Assessment & Plan Note (Signed)
CT chest with concern of bilateral pleural effusion.  Might be due to acute on chronic heart failure as there was concern of pulmonary edema. -Continue to monitor while she is being diuresed

## 2023-03-24 NOTE — Progress Notes (Signed)
Progress Note   Patient: Leah Diaz ZOX:[id number] DOB: 01-24-1961 DOA: 03/23/2023     1 DOS: the patient was seen and examined on 03/24/2023   Brief hospital course: Taken from H&P.  Leah Diaz is a 62 y.o. female with medical history significant for uterine sarcoma, anemia of chronic disease associated baseline hemoglobin 9-11, essential hypertension, chronic diastolic heart failure, who is admitted to [facility] on 03/23/2023 with acute on chronic diastolic heart failure after presenting from home to [facility] ED complaining of shortness of breath.  She was also experiencing worsening lower extremity edema and was recently started on Lasix 20 mg daily by her oncologist.  ED course.  She was tachycardic and saturating around 88% on room air and initially placed on 2 L of oxygen.  Labs pertinent for sodium of 131, potassium 3.2, creatinine 1.06, magnesium 1.5, BNP 95.  Troponin negative.  Initial lactate at 2>>1.8, CBC stable at baseline. EKG with sinus tachycardia and no significant ST changes. Chest x-ray with bilateral airspace opacities concerning for pneumonia. CTA with no evidence of PE, demonstrating moderate bilateral pleural effusion, multifocal groundglass opacities bilaterally, which appears to favor pulmonary edema, also showing patchy airspace opacities in the central lower lobe concerning for infection.  She received IV Lasix and started on Rocephin and Zithromax.  5/8: Continue to have mild tachycardia, hypomagnesemia resolved.  Potassium still at 3.3 which are being repleted.  TS H mildly elevated which will need repeat check in couple of weeks by PCP.  Also adding free T4.  Preliminary blood cultures negative in 12 hours.  Procalcitonin negative.  Clinically appears euvolemic with normal BNP and echocardiogram with grade 1 diastolic dysfunction only.  IVC was normal.  Decrease the dose of Lasix to daily and also added low-dose metoprolol to help with heart rate. Also  concern of disease progress has she already had an history of lung mets   Assessment and Plan: * Acute respiratory failure with hypoxia (HCC) With no baseline oxygen use, initially requiring up to 4 L of oxygen.  Multifactorial with concern of pulmonary edema/also has bilateral pleural effusions/history of lung mets. Concern of pneumonia but procalcitonin was negative. -Continue with supplemental oxygen -Wean as tolerated  Acute on chronic diastolic CHF (congestive heart failure) (HCC) Repeat echocardiogram today with grade 1 diastolic dysfunction only.  Normal IVC and pulmonary arterial pressure.  BNP was normal at 95 and she clinically appears euvolemic. CT chest with concern of bilateral pleural effusion. -Continue with IV Lasix for another day -Strict intake and output -Daily weight and BMP  Sinus tachycardia Per patient her heart rate is up since she was started on chemo. TSH mildly elevated. -Ordered free T4 -Add low-dose metoprolol  Pleural effusion CT chest with concern of bilateral pleural effusion.  Might be due to acute on chronic heart failure as there was concern of pulmonary edema. -Continue to monitor while she is being diuresed  CAP (community acquired pneumonia) CT chest concerning for possible pneumonia but procalcitonin was negative.  Also has an history of lung mets. -Continue antibiotics for now based on her risk factors. -Continue with supportive care  Essential hypertension BP on softer side.  Patient was on low-dose losartan at home. -Holding home losartan while she is being diuresed -Continue with Lasix-dose decreased to daily -Adding low-dose metoprolol  Acute hyponatremia Sodium at 132 today.  Patient has hypoosmolar hyponatremia.  Can be due to volume up with acute on chronic diastolic heart failure.  SIADH was another possibility.  Patient was also on HCTZ which need to be discontinued. -Monitor sodium  Lactic  acidosis Resolved.  Hypokalemia Most likely secondary to Lasix use. -Replace potassium and monitor  Hypomagnesemia Resolved with repletion  Anemia of chronic disease Appears to be at baseline. -Continue to monitor  Uterine sarcoma [facility]) Patient is undergoing chemotherapy as outpatient. -Continue with outpatient management   Subjective: Patient was feeling little improved when seen today.  She has not tried ambulation.  Per patient she does has some mets in her lungs and is undergoing active treatment with oncology.  Physical Exam: Vitals:   03/24/23 1045 03/24/23 1310 03/24/23 1345 03/24/23 1430  BP: 102/64  106/62 101/63  Pulse: (!) 110  (!) 110 (!) 110  Resp: 17  (!) 22 (!) 22  Temp:  97.8 F (36.6 C)    TempSrc:      SpO2: 98%  98% 97%  Weight:      Height:       General.  Well-developed lady, in no acute distress. Pulmonary.  Lungs clear bilaterally, normal respiratory effort. CV.  Regular rate and rhythm, no JVD, rub or murmur. Abdomen.  Soft, nontender, nondistended, BS positive. CNS.  Alert and oriented .  No focal neurologic deficit. Extremities.  No edema, no cyanosis, pulses intact and symmetrical. Psychiatry.  Judgment and insight appears normal.   Data Reviewed: Prior data reviewed.  Family Communication: Discussed with patient  Disposition: Status is: Inpatient Remains inpatient appropriate because: Severity of illness  Planned Discharge Destination: Home  Time spent: 50 minutes  This record has been created using Conservation officer, historic buildings. Errors have been sought and corrected,but may not always be located. Such creation errors do not reflect on the standard of care.   Author: Arnetha Courser, MD 03/24/2023 3:50 PM  For on call review [email or web address].

## 2023-03-24 NOTE — Assessment & Plan Note (Signed)
Patient is undergoing chemotherapy as outpatient. -Continue with outpatient management

## 2023-03-24 NOTE — Progress Notes (Signed)
  Echocardiogram 2D Echocardiogram has been performed.  Leah Diaz 03/24/2023, 9:56 AM

## 2023-03-24 NOTE — Assessment & Plan Note (Signed)
Appears to be at baseline. -Continue to monitor

## 2023-03-24 NOTE — Assessment & Plan Note (Signed)
With no baseline oxygen use, initially requiring up to 4 L of oxygen.  Multifactorial with concern of pulmonary edema/also has bilateral pleural effusions/history of lung mets. Concern of pneumonia but procalcitonin was negative. -Continue with supplemental oxygen -Wean as tolerated

## 2023-03-24 NOTE — Assessment & Plan Note (Signed)
CT chest concerning for possible pneumonia but procalcitonin was negative.  Also has an history of lung mets. -Continue antibiotics for now based on her risk factors. -Continue with supportive care

## 2023-03-24 NOTE — Assessment & Plan Note (Signed)
Resolved with repletion. ?

## 2023-03-24 NOTE — Assessment & Plan Note (Signed)
BP on softer side.  Patient was on low-dose losartan at home. -Holding home losartan while she is being diuresed -Continue with Lasix-dose decreased to daily -Adding low-dose metoprolol

## 2023-03-24 NOTE — Assessment & Plan Note (Signed)
Sodium at 132 today.  Patient has hypoosmolar hyponatremia.  Can be due to volume up with acute on chronic diastolic heart failure.  SIADH was another possibility.  Patient was also on HCTZ which need to be discontinued. -Monitor sodium

## 2023-03-24 NOTE — Assessment & Plan Note (Signed)
Resolved

## 2023-03-24 NOTE — ED Notes (Signed)
Durward Parcel, MD regarding duplicate order for potassium. MD stated to hold duplicate order

## 2023-03-24 NOTE — Assessment & Plan Note (Signed)
Repeat echocardiogram today with grade 1 diastolic dysfunction only.  Normal IVC and pulmonary arterial pressure.  BNP was normal at 95 and she clinically appears euvolemic. CT chest with concern of bilateral pleural effusion. -Continue with IV Lasix for another day -Strict intake and output -Daily weight and BMP

## 2023-03-24 NOTE — Assessment & Plan Note (Signed)
Most likely secondary to Lasix use. -Replace potassium and monitor 

## 2023-03-25 DIAGNOSIS — J90 Pleural effusion, not elsewhere classified: Secondary | ICD-10-CM | POA: Diagnosis not present

## 2023-03-25 DIAGNOSIS — R000 Tachycardia, unspecified: Secondary | ICD-10-CM | POA: Diagnosis not present

## 2023-03-25 DIAGNOSIS — I5033 Acute on chronic diastolic (congestive) heart failure: Secondary | ICD-10-CM | POA: Diagnosis not present

## 2023-03-25 DIAGNOSIS — J9601 Acute respiratory failure with hypoxia: Secondary | ICD-10-CM | POA: Diagnosis not present

## 2023-03-25 LAB — CBC
HCT: 30.7 % — ABNORMAL LOW (ref 36.0–46.0)
Hemoglobin: 9.8 g/dL — ABNORMAL LOW (ref 12.0–15.0)
MCH: 30.5 pg (ref 26.0–34.0)
MCHC: 31.9 g/dL (ref 30.0–36.0)
MCV: 95.6 fL (ref 80.0–100.0)
Platelets: 525 10*3/uL — ABNORMAL HIGH (ref 150–400)
RBC: 3.21 MIL/uL — ABNORMAL LOW (ref 3.87–5.11)
RDW: 22.4 % — ABNORMAL HIGH (ref 11.5–15.5)
WBC: 6.5 10*3/uL (ref 4.0–10.5)
nRBC: 0 % (ref 0.0–0.2)

## 2023-03-25 LAB — BASIC METABOLIC PANEL
Anion gap: 8 (ref 5–15)
BUN: 18 mg/dL (ref 8–23)
CO2: 26 mmol/L (ref 22–32)
Calcium: 8.4 mg/dL — ABNORMAL LOW (ref 8.9–10.3)
Chloride: 100 mmol/L (ref 98–111)
Creatinine, Ser: 0.81 mg/dL (ref 0.44–1.00)
GFR, Estimated: >60 mL/min (ref >60)
Glucose, Bld: 100 mg/dL — ABNORMAL HIGH (ref 70–99)
Potassium: 3.4 mmol/L — ABNORMAL LOW (ref 3.5–5.1)
Sodium: 134 mmol/L — ABNORMAL LOW (ref 135–145)

## 2023-03-25 LAB — CULTURE, BLOOD (ROUTINE X 2): Culture: NO GROWTH

## 2023-03-25 LAB — T4, FREE: Free T4: 1.24 ng/dL — ABNORMAL HIGH (ref 0.61–1.12)

## 2023-03-25 MED ORDER — ADULT MULTIVITAMIN W/MINERALS CH
1.0000 | ORAL_TABLET | Freq: Every day | ORAL | Status: DC
  Administered 2023-03-25 – 2023-03-26 (×2): 1 via ORAL
  Filled 2023-03-25 (×2): qty 1

## 2023-03-25 MED ORDER — ORAL CARE MOUTH RINSE: 15.0000 mL | OROMUCOSAL | Status: DC | PRN

## 2023-03-25 MED ORDER — ENSURE MAX PROTEIN PO LIQD
11.0000 [oz_av] | Freq: Two times a day (BID) | ORAL | Status: DC
  Administered 2023-03-25 – 2023-03-26 (×2): 11 [oz_av] via ORAL
  Filled 2023-03-25 (×3): qty 330

## 2023-03-25 NOTE — Assessment & Plan Note (Addendum)
-  Resume Losartan without HCTZ component -Continue with Lasix -Adding low-dose metoprolol

## 2023-03-25 NOTE — Assessment & Plan Note (Addendum)
-  Echo with grade 1 diastolic dysfunction only. -CT chest with concern of bilateral pleural effusion on presentation -Transition back to PO Lasix -Daily weights as an outpatient -Appears to be euvolemic at this time -Continue ASA

## 2023-03-25 NOTE — Progress Notes (Signed)
Progress Note   Patient: Leah Diaz NFA:[id number] DOB: 10/23/1961 DOA: 03/23/2023     2 DOS: the patient was seen and examined on 03/25/2023   Brief hospital course: Taken from H&P.  Leah Diaz is a 62 y.o. female with medical history significant for uterine sarcoma, anemia of chronic disease associated baseline hemoglobin 9-11, essential hypertension, chronic diastolic heart failure, who is admitted to [facility] on 03/23/2023 with acute on chronic diastolic heart failure after presenting from home to [facility] ED complaining of shortness of breath.  She was also experiencing worsening lower extremity edema and was recently started on Lasix 20 mg daily by her oncologist.  ED course.  She was tachycardic and saturating around 88% on room air and initially placed on 2 L of oxygen.  Labs pertinent for sodium of 131, potassium 3.2, creatinine 1.06, magnesium 1.5, BNP 95.  Troponin negative.  Initial lactate at 2>>1.8, CBC stable at baseline. EKG with sinus tachycardia and no significant ST changes. Chest x-ray with bilateral airspace opacities concerning for pneumonia. CTA with no evidence of PE, demonstrating moderate bilateral pleural effusion, multifocal groundglass opacities bilaterally, which appears to favor pulmonary edema, also showing patchy airspace opacities in the central lower lobe concerning for infection.  She received IV Lasix and started on Rocephin and Zithromax.  5/8: Continue to have mild tachycardia, hypomagnesemia resolved.  Potassium still at 3.3 which are being repleted.  TS H mildly elevated which will need repeat check in couple of weeks by PCP.  Also adding free T4.  Preliminary blood cultures negative in 12 hours.  Procalcitonin negative.  Clinically appears euvolemic with normal BNP and echocardiogram with grade 1 diastolic dysfunction only.  IVC was normal.  Decrease the dose of Lasix to daily and also added low-dose metoprolol to help with heart rate. Also  concern of disease progress has she already had an history of lung mets.  5/9: Vitals with improved sinus tachycardia.  Mild hypokalemia with potassium at 3.4.  Free T4 was also elevated at 1.24, both TSH and free T4 are elevated-patient will need outpatient endocrinology evaluation. Remained little short of breath.  Do desaturate up to 79% with ambulation requiring 2 L of oxygen.  Home oxygen was ordered.  We will continue with IV diuresis for another day and hoping to go home tomorrow.   Assessment and Plan: * Acute respiratory failure with hypoxia (HCC) With no baseline oxygen use, initially requiring up to 4 L of oxygen.  Multifactorial with concern of pulmonary edema/also has bilateral pleural effusions/history of lung mets. Concern of pneumonia but procalcitonin was negative. -Continue with supplemental oxygen-patient needs at least 2 L of oxygen to go home -Wean as tolerated  Acute on chronic diastolic CHF (congestive heart failure) (HCC) Repeat echocardiogram today with grade 1 diastolic dysfunction only.  Normal IVC and pulmonary arterial pressure.  BNP was normal at 95 and she clinically appears euvolemic. CT chest with concern of bilateral pleural effusion. -Continue with IV Lasix for another day -Strict intake and output -Daily weight and BMP  Sinus tachycardia Per patient her heart rate is up since she was started on chemo. TSH and free T4 both were mildly elevated-patient will need outpatient endocrinology evaluation -Continue with metoprolol-added on 5/8  Pleural effusion CT chest with concern of bilateral pleural effusion.  Might be due to acute on chronic heart failure as there was concern of pulmonary edema. -Continue to monitor while she is being diuresed  CAP (community acquired pneumonia) CT chest concerning  for possible pneumonia but procalcitonin was negative.  Also has an history of lung mets. -Continue antibiotics for now based on her risk factors. -Continue  with supportive care  Essential hypertension BP on softer side.  Patient was on low-dose losartan at home. -Holding home losartan while she is being diuresed -Continue with Lasix -Adding low-dose metoprolol  Acute hyponatremia Sodium at 134 today, improving.  Patient has hypoosmolar hyponatremia.  Can be due to volume up with acute on chronic diastolic heart failure.  SIADH was another possibility.  Patient was also on HCTZ which need to be discontinued. -Monitor sodium  Lactic acidosis Resolved.  Hypokalemia Most likely secondary to Lasix use. -Replace potassium and monitor  Hypomagnesemia Resolved with repletion  Anemia of chronic disease Appears to be at baseline. -Continue to monitor  Uterine sarcoma [facility]) Patient is undergoing chemotherapy as outpatient. -Continue with outpatient management   Subjective: Patient continued to have some shortness of breath.  Remained mildly tachycardic but improved than before.  No new concern.  Physical Exam: Vitals:   03/25/23 0500 03/25/23 0821 03/25/23 0839 03/25/23 1206  BP:  115/73 118/74 101/65  Pulse:  (!) 107 (!) 101 (!) 105  Resp:  20  20  Temp:  97.7 F (36.5 C)  97.8 F (36.6 C)  TempSrc:  Oral  Oral  SpO2:  94%  97%  Weight: 80.4 kg     Height:       General.  Well-developed lady, in no acute distress. Pulmonary.  Lungs clear bilaterally, normal respiratory effort. CV.  Regular rate and rhythm, no JVD, rub or murmur. Abdomen.  Soft, nontender, nondistended, BS positive. CNS.  Alert and oriented .  No focal neurologic deficit. Extremities.  Trace LE edema, no cyanosis, pulses intact and symmetrical. Psychiatry.  Judgment and insight appears normal.   Data Reviewed: Prior data reviewed.  Family Communication: Discussed with patient  Disposition: Status is: Inpatient Remains inpatient appropriate because: Severity of illness  Planned Discharge Destination: Home  Time spent: 45 minutes  This record has  been created using Conservation officer, historic buildings. Errors have been sought and corrected,but may not always be located. Such creation errors do not reflect on the standard of care.   Author: Arnetha Courser, MD 03/25/2023 1:02 PM  For on call review [email or web address].

## 2023-03-25 NOTE — Assessment & Plan Note (Deleted)
Most likely secondary to Lasix use. -Replace potassium and monitor

## 2023-03-25 NOTE — Assessment & Plan Note (Deleted)
Resolved

## 2023-03-25 NOTE — Assessment & Plan Note (Deleted)
Sodium at 134 today, improving.  Patient has hypoosmolar hyponatremia.  Can be due to volume up with acute on chronic diastolic heart failure.  SIADH was another possibility.  Patient was also on HCTZ which need to be discontinued. -Monitor sodium

## 2023-03-25 NOTE — Evaluation (Signed)
Physical Therapy Evaluation Patient Details Name: Leah Diaz MRN: [id number] DOB: [birth year] Today's Date: 03/25/2023  History of Present Illness  Leah Diaz is a 62 y.o. female admitted with acute respiratory failure with hypoxia. PMH: uterine sarcoma, anemia of chronic disease associated baseline hemoglobin 9-11, essential hypertension, chronic diastolic heart failure  Clinical Impression  Pt admitted with above diagnosis. Pt from home, ind at baseline without DME or O2, works from home, denies falls. Pt comes to EOB modified ind, powers to stand with supv, cues for O2 tubing management. Pt reports recently return to room from amb with nursing, but demo good steadiness to amb around bed to recliner. Recommend rollator for community distances as pt fatigues easily. Pt may progress to not needing HHPT at d/c, will progress as able. Pt currently with functional limitations due to the deficits listed below (see PT Problem List). Pt will benefit from acute skilled PT to increase their independence and safety with mobility to allow discharge.          Recommendations for follow up therapy are one component of a multi-disciplinary discharge planning process, led by the attending physician.  Recommendations may be updated based on patient status, additional functional criteria and insurance authorization.  Follow Up Recommendations       Assistance Recommended at Discharge PRN  Patient can return home with the following  Help with stairs or ramp for entrance;Assist for transportation;Assistance with cooking/housework    Equipment Recommendations Rollator (4 wheels)  Recommendations for Other Services       Functional Status Assessment Patient has had a recent decline in their functional status and demonstrates the ability to make significant improvements in function in a reasonable and predictable amount of time.     Precautions / Restrictions Precautions Precautions:  Fall Restrictions Weight Bearing Restrictions: No      Mobility  Bed Mobility Overal bed mobility: Modified Independent   Transfers Overall transfer level: Needs assistance Equipment used:  (IV pole) Transfers: Sit to/from Stand Sit to Stand: Supervision  General transfer comment: cues for O2 tubing, supv    Ambulation/Gait Ambulation/Gait assistance: Supervision Gait Distance (Feet): 15 Feet Assistive device: IV Pole Gait Pattern/deviations: Step-through pattern, Decreased stride length Gait velocity: decreased  General Gait Details: pt reports just amb in hallway with nursing, able to amb from EOB around to recliner, good steadiness, therapist managing O2 tubing and cuing pt for managing O2 tubing  Stairs            Wheelchair Mobility    Modified Rankin (Stroke Patients Only)       Balance Overall balance assessment: No apparent balance deficits (not formally assessed)       Pertinent Vitals/Pain Pain Assessment Pain Assessment: No/denies pain    Home Living Family/patient expects to be discharged to:: Private residence Living Arrangements: Spouse/significant other Available Help at Discharge: Family;Available 24 hours/day Type of Home: House Home Access: Stairs to enter   [employer] of Steps: 2   Home Layout: Two level;Able to live on main level with bedroom/bathroom Home Equipment: Rolling Walker (2 wheels);Shower seat - built in      Prior Function Prior Level of Function : Independent/Modified Independent;Working/employed  Mobility Comments: pt reports ind without AD ADLs Comments: pt reports ind     Hand Dominance        Extremity/Trunk Assessment   Upper Extremity Assessment Upper Extremity Assessment: Overall WFL for tasks assessed    Lower Extremity Assessment Lower Extremity Assessment: Overall WFL for tasks assessed  Cervical / Trunk Assessment Cervical / Trunk Assessment: Normal  Communication    Communication: No difficulties  Cognition Arousal/Alertness: Awake/alert Behavior During Therapy: WFL for tasks assessed/performed Overall Cognitive Status: Within Functional Limits for tasks assessed     General Comments      Exercises     Assessment/Plan    PT Assessment Patient needs continued PT services  PT Problem List Decreased activity tolerance;Cardiopulmonary status limiting activity       PT Treatment Interventions DME instruction;Gait training;Stair training;Functional mobility training;Therapeutic activities;Therapeutic exercise;Balance training;Patient/family education    PT Goals (Current goals can be found in the Care Plan section)  Acute Rehab PT Goals Patient Stated Goal: improving endurance PT Goal Formulation: With patient Time For Goal Achievement: 04/08/23 Potential to Achieve Goals: Good    Frequency Min 1X/week     Co-evaluation               AM-PAC PT "6 Clicks" Mobility  Outcome Measure Help needed turning from your back to your side while in a flat bed without using bedrails?: None Help needed moving from lying on your back to sitting on the side of a flat bed without using bedrails?: None Help needed moving to and from a bed to a chair (including a wheelchair)?: None Help needed standing up from a chair using your arms (e.g., wheelchair or bedside chair)?: None Help needed to walk in hospital room?: A Little Help needed climbing 3-5 steps with a railing? : A Little 6 Click Score: 22    End of Session Equipment Utilized During Treatment: Oxygen Activity Tolerance: Patient tolerated treatment well Patient left: in chair;with call bell/phone within reach Nurse Communication: Mobility status PT Visit Diagnosis: Other abnormalities of gait and mobility (R26.89)    Time: 4098-1191 PT Time Calculation (min) (ACUTE ONLY): 12 min   Charges:   PT Evaluation $PT Eval Low Complexity: 1 Low           Tori Maureena Dabbs PT, DPT 03/25/23,  12:52 PM

## 2023-03-25 NOTE — Assessment & Plan Note (Addendum)
-  With no baseline oxygen use, initially requiring up to 4 L of oxygen.  Multifactorial with concern of pulmonary edema/also has bilateral pleural effusions/history of lung mets. -Possible pneumonia but procalcitonin was negative and O2 requirement has not improved with treatment -Continue with supplemental oxygen-patient needs at least 2 L of oxygen to go home -Wean as tolerated

## 2023-03-25 NOTE — Assessment & Plan Note (Addendum)
-  Per patient her heart rate is up since she was started on chemo. -TSH and free T4 both were mildly elevated-patient will need outpatient endocrinology evaluation -Continue with metoprolol (started during hospitalization)

## 2023-03-25 NOTE — Progress Notes (Signed)
Initial Nutrition Assessment  DOCUMENTATION CODES:   Severe malnutrition in context of chronic illness  INTERVENTION:  Vanilla Ensure Max po BID, each supplement provides 150 kcal and 30 grams of protein.   MVI   Education on adequate nutrition   Encourage po intake   NUTRITION DIAGNOSIS:   Severe Malnutrition related to chronic illness as evidenced by percent weight loss, energy intake < 75% for > or equal to 1 month.   GOAL:   Patient will meet greater than or equal to 90% of their needs  MONITOR:   PO intake, Skin, Supplement acceptance, I & O's, Labs, Weight trends  REASON FOR ASSESSMENT:   Malnutrition Screening Tool     ASSESSMENT:   62 y.o. female with PMHx including uterine sarcoma, anemia of chronic disease associated with hgb 9-11, essential HTN, diastolic CHF who presents with SOB.  Labs: Na 134, K+ 3.4, Glu 100, phos 4.9  Meds: zithromax, rocephin, lasix, MVI, protonix, klor-con Wt: 35# (16%) wt loss x 4 months  -admit wt 80.4 kg  I/O's: -180 ml   Visited patient at bedside who reports improved appetite since admission. She states she was not eating much and did not have a taste for anything. She reports a 35-40# total weight loss since cancer diagnosis. Patient reports she has LE swelling 2/2 chemo which could be further masking weight loss. As RD was conducting NFPE, she reports her face being much fuller in the past.   Patient reports SOB whenever she is not getting O2 via nasal cannula.    Lunch was observed at bedside which was a soup and salad. She reports she ordered a chef salad for dinner tonight. Patient was getting important phone call at this point, so RD concluded interview early.   She requested premier protein, but is accepting to Ensure max.   NUTRITION - FOCUSED PHYSICAL EXAM:  Flowsheet Row Most Recent Value  Orbital Region Moderate depletion  Upper Arm Region Mild depletion  Thoracic and Lumbar Region No depletion  Buccal Region  Mild depletion  Temple Region Mild depletion  Clavicle Bone Region No depletion  Clavicle and Acromion Bone Region No depletion  Scapular Bone Region Unable to assess  Dorsal Hand Mild depletion  Patellar Region Mild depletion  Anterior Thigh Region Unable to assess  [PJ pants in the way]  Posterior Calf Region Unable to assess  [edema]  Edema (RD Assessment) Unable to assess  Hair Unable to assess  [hair cap on]  Eyes Reviewed  Mouth Reviewed  Skin Reviewed  Nails Reviewed       Diet Order:   Diet Order             Diet regular Room service appropriate? Yes; Fluid consistency: Thin  Diet effective now                   EDUCATION NEEDS:   Education needs have been addressed  Skin:  Skin Assessment: Reviewed RN Assessment  Last BM:  5/8  Height:   Ht Readings from Last 1 Encounters:  03/24/23 5' 7.5" (1.715 m)    Weight:   Wt Readings from Last 1 Encounters:  03/25/23 80.4 kg    BMI:  Body mass index is 27.35 kg/m.  Estimated Nutritional Needs:   Kcal:  1700-2000 kcal  Protein:  90-110 g  Fluid:  > 1.7 L, or per physician discretion    Leodis Rains, RDN, LDN  Clinical Nutrition

## 2023-03-26 ENCOUNTER — Other Ambulatory Visit (HOSPITAL_COMMUNITY): Payer: Self-pay

## 2023-03-26 ENCOUNTER — Encounter: Payer: Self-pay | Admitting: Hematology and Oncology

## 2023-03-26 ENCOUNTER — Telehealth: Payer: Self-pay | Admitting: Oncology

## 2023-03-26 DIAGNOSIS — E43 Unspecified severe protein-calorie malnutrition: Secondary | ICD-10-CM | POA: Diagnosis present

## 2023-03-26 LAB — CULTURE, BLOOD (ROUTINE X 2): Culture: NO GROWTH

## 2023-03-26 LAB — BASIC METABOLIC PANEL
Anion gap: 10 (ref 5–15)
BUN: 15 mg/dL (ref 8–23)
CO2: 26 mmol/L (ref 22–32)
Calcium: 8.5 mg/dL — ABNORMAL LOW (ref 8.9–10.3)
Chloride: 100 mmol/L (ref 98–111)
Creatinine, Ser: 0.79 mg/dL (ref 0.44–1.00)
GFR, Estimated: >60 mL/min (ref >60)
Glucose, Bld: 118 mg/dL — ABNORMAL HIGH (ref 70–99)
Potassium: 3.1 mmol/L — ABNORMAL LOW (ref 3.5–5.1)
Sodium: 136 mmol/L (ref 135–145)

## 2023-03-26 MED ORDER — LOSARTAN POTASSIUM 50 MG PO TABS
50.0000 mg | ORAL_TABLET | Freq: Every day | ORAL | 1 refills | Status: DC
  Filled 2023-03-26: qty 30, 30d supply, fill #0

## 2023-03-26 MED ORDER — HEPARIN SOD (PORK) LOCK FLUSH 100 UNIT/ML IV SOLN
500.0000 [IU] | INTRAVENOUS | Status: AC | PRN
  Administered 2023-03-26: 500 [IU]
  Filled 2023-03-26: qty 5

## 2023-03-26 MED ORDER — METOPROLOL TARTRATE 25 MG PO TABS
12.5000 mg | ORAL_TABLET | Freq: Two times a day (BID) | ORAL | 1 refills | Status: DC
  Filled 2023-03-26: qty 34, 34d supply, fill #0

## 2023-03-26 MED ORDER — ADULT MULTIVITAMIN W/MINERALS CH
1.0000 | ORAL_TABLET | Freq: Every day | ORAL | 1 refills | Status: AC
  Filled 2023-03-26: qty 30, 30d supply, fill #0

## 2023-03-26 MED ORDER — AMOXICILLIN-POT CLAVULANATE 875-125 MG PO TABS
1.0000 | ORAL_TABLET | Freq: Two times a day (BID) | ORAL | 0 refills | Status: AC
  Filled 2023-03-26: qty 6, 3d supply, fill #0

## 2023-03-26 MED ORDER — ENSURE MAX PROTEIN PO LIQD
11.0000 [oz_av] | Freq: Two times a day (BID) | ORAL | 0 refills | Status: AC
  Filled 2023-03-26: qty 19800, 30d supply, fill #0

## 2023-03-26 NOTE — Assessment & Plan Note (Signed)
-  CT chest concerning for possible pneumonia but procalcitonin was negative.   -Also has an history of lung mets. -Continue antibiotics, complete Augmentin for total of 5 days (3 additional days)

## 2023-03-26 NOTE — Progress Notes (Signed)
Physical Therapy Treatment Patient Details Name: Leah Diaz MRN: [id number] DOB: Apr 16, 1961 Today's Date: 03/26/2023   History of Present Illness Leah Diaz is a 62 y.o. female admitted with acute respiratory failure with hypoxia. PMH: uterine sarcoma, anemia of chronic disease associated baseline hemoglobin 9-11, essential hypertension, chronic diastolic heart failure    PT Comments    Pt up in recliner, reports going home today, wanting to ambulate with rollator. Pt educated on rollator brake management, demo'd brake management and body positioning. Pt amb 120 ft with rollator, able to position O2 tubing appropriately, therapist managing tank. Pt on 2L with SpO2 91-95% with ambulation, desats to 86% post ambulation once seated, able to recover to 92% within ~1 minute of pursed lip breathing.     Recommendations for follow up therapy are one component of a multi-disciplinary discharge planning process, led by the attending physician.  Recommendations may be updated based on patient status, additional functional criteria and insurance authorization.  Follow Up Recommendations       Assistance Recommended at Discharge PRN  Patient can return home with the following Help with stairs or ramp for entrance;Assist for transportation;Assistance with cooking/housework   Equipment Recommendations  Rollator (4 wheels)    Recommendations for Other Services       Precautions / Restrictions Precautions Precautions: Fall Restrictions Weight Bearing Restrictions: No     Mobility  Bed Mobility  General bed mobility comments: in recliner    Transfers Overall transfer level: Modified independent Equipment used: None  General transfer comment: modified ind with STS transfers from recliner and BSC, able to manage O2 tubing appropriately    Ambulation/Gait Ambulation/Gait assistance: Supervision Gait Distance (Feet): 120 Feet Assistive device: Rollator (4 wheels) Gait  Pattern/deviations: Decreased stride length, Step-through pattern Gait velocity: decreased  General Gait Details: step through gait pattern with decreased cadence, good steadiness, able to don/doff rollator brakes appropriately for seated rest break   Stairs             Wheelchair Mobility    Modified Rankin (Stroke Patients Only)       Balance Overall balance assessment: No apparent balance deficits (not formally assessed)     Cognition Arousal/Alertness: Awake/alert Behavior During Therapy: WFL for tasks assessed/performed Overall Cognitive Status: Within Functional Limits for tasks assessed     Exercises      General Comments General comments (skin integrity, edema, etc.): Pt on 2L O2 with SpO2 95-91% with ambulation, desats to 86% after ambulation during seated rest, recovers to 92% within ~1 min of pursed lip breathing on 2L; HR max 131 with ambulation noted, recovers to 110 with seated rest      Pertinent Vitals/Pain Pain Assessment Pain Assessment: No/denies pain    Home Living                          Prior Function            PT Goals (current goals can now be found in the care plan section) Acute Rehab PT Goals Patient Stated Goal: improving endurance PT Goal Formulation: With patient Time For Goal Achievement: 04/08/23 Potential to Achieve Goals: Good Progress towards PT goals: Progressing toward goals    Frequency    Min 1X/week      PT Plan Current plan remains appropriate    Co-evaluation              AM-PAC PT "6 Clicks" Mobility   Outcome Measure  Help  needed turning from your back to your side while in a flat bed without using bedrails?: None Help needed moving from lying on your back to sitting on the side of a flat bed without using bedrails?: None Help needed moving to and from a bed to a chair (including a wheelchair)?: None Help needed standing up from a chair using your arms (e.g., wheelchair or bedside  chair)?: None Help needed to walk in hospital room?: A Little Help needed climbing 3-5 steps with a railing? : A Little 6 Click Score: 22    End of Session Equipment Utilized During Treatment: Oxygen Activity Tolerance: Patient tolerated treatment well Patient left: in chair;with call bell/phone within reach;with nursing/sitter in room Nurse Communication: Mobility status PT Visit Diagnosis: Other abnormalities of gait and mobility (R26.89)     Time: 1610-9604 PT Time Calculation (min) (ACUTE ONLY): 32 min  Charges:  $Gait Training: 8-22 mins $Therapeutic Activity: 8-22 mins                      Tori Asim Gersten PT, DPT 03/26/23, 3:07 PM

## 2023-03-26 NOTE — TOC Initial Note (Signed)
Transition of Care [facility]) - Initial/Assessment Note    Patient Details  Name: Leah Diaz MRN: [id number] Date of Birth: Aug 06, 1961  [facility] CM/SW Contact:    Adrian Prows, RN Phone Number: 03/26/2023, 11:26 AM  Clinical Narrative:                 [facility] consult for d/c planning; recc for HHPT, home oxygen, and Rolator; spoke w/ pt in room; pt says she plans to return home at d/c; she identified her POC Will Tregre (spouse) [phone]; pt says she has transportation; pt says she has reading glasses; she also has a built in shower seat and safety bars in her shower; pt says she does not have HA, and dentures; pt says she does not have dme, HH services, and home oxygen; pt declines HHPT; she accepts recc for Rolator and home oxygen; she does not have an agency preference; contacted Jermaine at [employer] and he says agency can provide services; DME and travel oxygen tank will be delivered to pt's room; pt notified; contact info for agency placed in follow-up provider section of d/c instructions; no TOC needs. Expected Discharge Plan: Home/Self Care Barriers to Discharge: Continued Medical Work up   Patient Goals and CMS Choice Patient states their goals for this hospitalization and ongoing recovery are:: home          Expected Discharge Plan and Services   Discharge Planning Services: CM Consult Post Acute Care Choice: Durable Medical Equipment (Rolator and home oxygen) Living arrangements for the past 2 months: Single Family Home                 DME Arranged: Oxygen, Walker rolling with seat DME Agency: [employer] Date DME Agency Contacted: 03/26/23 Time DME Agency Contacted: [id number] Representative spoke with at DME Agency: Vaughan Basta HH Arranged: Refused HH          Prior Living Arrangements/Services Living arrangements for the past 2 months: Single Family Home Lives with:: Spouse Patient language and need for interpreter reviewed:: Yes Do  you feel safe going back to the place where you live?: Yes      Need for Family Participation in Patient Care: Yes (Comment) Care giver support system in place?: Yes (comment) Current home services:  (n/a) Criminal Activity/Legal Involvement Pertinent to Current Situation/Hospitalization: No - Comment as needed  Activities of Daily Living Home Assistive Devices/Equipment: None ADL Screening (condition at time of admission) Patient's cognitive ability adequate to safely complete daily activities?: Yes Is the patient deaf or have difficulty hearing?: No Does the patient have difficulty seeing, even when wearing glasses/contacts?: No Does the patient have difficulty concentrating, remembering, or making decisions?: No Patient able to express need for assistance with ADLs?: Yes Does the patient have difficulty dressing or bathing?: No Independently performs ADLs?: Yes (appropriate for developmental age) Does the patient have difficulty walking or climbing stairs?: Yes Weakness of Legs: None Weakness of Arms/Hands: None  Permission Sought/Granted Permission sought to share information with : Case Manager, Guardian Permission granted to share information with : Yes, Verbal Permission Granted  Share Information with NAME: Burnard Bunting, RN, CM     Permission granted to share info w Relationship: Will Weeks (spouse) [phone]     Emotional Assessment Appearance:: Appears stated age Attitude/Demeanor/Rapport: Gracious Affect (typically observed): Accepting Orientation: : Oriented to Self, Oriented to Place, Oriented to  Time, Oriented to Situation Alcohol / Substance Use: Not Applicable Psych Involvement: No (comment)  Admission diagnosis:  Acute pulmonary edema (HCC) [J81.0] Pleural effusion [J90] Tachycardia [R00.0] Acute respiratory failure with hypoxia (HCC) [J96.01] Malignant neoplasm of uterus, unspecified site [facility]) [C55] Acute hypoxic respiratory failure (HCC)  [J96.01] Patient Active Problem List   Diagnosis Date Noted   Protein-calorie malnutrition, severe 03/26/2023   Acute on chronic diastolic CHF (congestive heart failure) (HCC) 03/24/2023   CAP (community acquired pneumonia) 03/24/2023   Pleural effusion 03/24/2023   Lactic acidosis 03/24/2023   Acute hyponatremia 03/24/2023   Hypokalemia 03/24/2023   Hypomagnesemia 03/24/2023   Anemia of chronic disease 03/24/2023   Allergic rhinitis 03/24/2023   Sinus tachycardia 03/24/2023   Acute respiratory failure with hypoxia (HCC) 03/23/2023   Essential hypertension 10/29/2022   Protein-calorie malnutrition, moderate (HCC) 10/29/2022   Pancytopenia, acquired (HCC) 10/01/2022   Nasal congestion 10/01/2022   Bilateral leg edema 10/01/2022   Anemia due to antineoplastic chemotherapy 07/16/2022   Acute renal failure (ARF) (HCC) 07/16/2022   Fever 07/02/2022   Elevated LFTs 07/02/2022   Goals of care, counseling/discussion 06/15/2022   Mucositis due to antineoplastic therapy 05/07/2022   Nausea without vomiting 05/07/2022   Class 1 obesity due to excess calories with body mass index (BMI) of 34.0 to 34.9 in adult 03/31/2022   Uterine sarcoma (HCC)    PCP:  Juliette Alcide, MD Pharmacy:   [facility] [phone] - [place], [place] - [address] [facility] [address] [place] [address] Phone: [phone] Fax: [phone]  [id number] [place], [place] - [address] [place] [address] Phone: [phone] Fax: [phone]  [place] [facility] [address] [place] [address] Phone: [phone] Fax: [phone]     Social Determinants of Health (SDOH) Social History: SDOH Screenings   Food Insecurity: No Food Insecurity (03/26/2023)  Housing: Low Risk  (03/26/2023)  Transportation Needs: No Transportation Needs (03/26/2023)  Utilities: Not At Risk (03/26/2023)  Tobacco Use:  Low Risk  (03/24/2023)   SDOH Interventions: Food Insecurity Interventions: Inpatient TOC Housing Interventions: Inpatient TOC Transportation Interventions: Inpatient TOC Utilities Interventions: Inpatient TOC   Readmission Risk Interventions     No data to display

## 2023-03-26 NOTE — Discharge Summary (Signed)
Physician Discharge Summary   Patient: Leah Diaz MRN: [id number] DOB: Jun 29, 1961  Admit date:     03/23/2023  Discharge date: 03/26/23  Discharge Physician: Jonah Blue   PCP: Juliette Alcide, MD   Recommendations at discharge:   Follow up with oncology and PCP in 1-2 weeks Continue Lasix 20 mg orally daily Take multivitamin daily and drink Ensure Max to improve nutritional status Continue metoprolol Change Hyzaar to Cozaar (losartan without HCTZ) Home oxygen has been ordered/arranged  Discharge Diagnoses: Principal Problem:   Acute respiratory failure with hypoxia (HCC) Active Problems:   Acute on chronic diastolic CHF (congestive heart failure) (HCC)   CAP (community acquired pneumonia)   Uterine sarcoma (HCC)   Sinus tachycardia   Essential hypertension   Protein-calorie malnutrition, severe   Hospital Course: Leah Diaz is a 61 y.o. female with medical history significant for uterine sarcoma, anemia of chronic disease associated baseline hemoglobin 9-11, essential hypertension, chronic diastolic heart failure, who was admitted to [facility] on 03/23/2023 with acute on chronic diastolic heart failure after presenting from home to [facility] ED complaining of shortness of breath, worsening lower extremity edema after having been recently started on Lasix 20 mg daily by her oncologist.  On presentation, 88% on room air and  placed on 2 L of oxygen. Chest x-ray with bilateral airspace opacities concerning for pneumonia.  CTA with no evidence of PE, demonstrating moderate bilateral pleural effusion, multifocal groundglass opacities bilaterally, which appears to favor pulmonary edema, also showing patchy airspace opacities in the central lower lobe concerning for infection.  She received IV Lasix and started on Rocephin and Zithromax.  TSH mildly elevated which will need repeat check in couple of weeks by PCP.  Blood cultures negative to date.  Procalcitonin negative.   Clinically appears euvolemic with normal BNP and echocardiogram with grade 1 diastolic dysfunction only.  IVC was normal.  Decreased the dose of Lasix to daily and also added low-dose metoprolol to help with heart rate.  Also concern of disease progression has she already had history of lung mets. Persistent desaturation to 79% with ambulation requiring 2 L of oxygen.  Home oxygen was ordered.       Assessment and Plan: * Acute respiratory failure with hypoxia (HCC) -With no baseline oxygen use, initially requiring up to 4 L of oxygen.  Multifactorial with concern of pulmonary edema/also has bilateral pleural effusions/history of lung mets. -Possible pneumonia but procalcitonin was negative and O2 requirement has not improved with treatment -Continue with supplemental oxygen-patient needs at least 2 L of oxygen to go home -Wean as tolerated  CAP (community acquired pneumonia) -CT chest concerning for possible pneumonia but procalcitonin was negative.   -Also has an history of lung mets. -Continue antibiotics, complete Augmentin for total of 5 days (3 additional days)  Acute on chronic diastolic CHF (congestive heart failure) (HCC) -Echo with grade 1 diastolic dysfunction only. -CT chest with concern of bilateral pleural effusion on presentation -Transition back to PO Lasix -Daily weights as an outpatient -Appears to be euvolemic at this time -Continue ASA  Sinus tachycardia -Per patient her heart rate is up since she was started on chemo. -TSH and free T4 both were mildly elevated-patient will need outpatient endocrinology evaluation -Continue with metoprolol (started during hospitalization)  Pleural effusion CT chest with concern of bilateral pleural effusion.  Might be due to acute on chronic heart failure as there was concern of pulmonary edema. -Continue to monitor while she is being diuresed  Uterine sarcoma (HCC) -Patient previously underwent treatment with chemotherapy,  currently on chemo holiday -Recently started on antiestrogen therapy as maintenance   Essential hypertension -Resume Losartan without HCTZ component -Continue with Lasix -Adding low-dose metoprolol  Acute hyponatremia Sodium at 132 today.  Patient has hypoosmolar hyponatremia.  Can be due to volume up with acute on chronic diastolic heart failure.  SIADH was another possibility.  Patient was also on HCTZ which need to be discontinued. -Monitor sodium  Lactic acidosis Resolved.  Hypokalemia Most likely secondary to Lasix use. -Replace potassium and monitor  Hypomagnesemia Resolved with repletion  Anemia of chronic disease Appears to be at baseline. -Continue to monitor  Protein-calorie malnutrition, severe Nutrition Problem: Severe Malnutrition Etiology: chronic illness Signs/Symptoms: percent weight loss, energy intake < 75% for > or equal to 1 month Percent weight loss: 16 % (in 4 months) Interventions: Education, Refer to RD note for recommendations, MVI, Other (Comment) (ensure max)     Pain control - [employer] Controlled Substance Reporting System database was reviewed. and patient was instructed, not to drive, operate heavy machinery, perform activities at heights, swimming or participation in water activities or provide baby-sitting services while on Pain, Sleep and Anxiety Medications; until their outpatient Physician has advised to do so again. Also recommended to not to take more than prescribed Pain, Sleep and Anxiety Medications.   Consultants: Nutrition, PT, TOC team Procedures performed: Echo  Disposition: Home Diet recommendation:  Regular diet DISCHARGE MEDICATION: Allergies as of 03/26/2023   No Known Allergies      Medication List     STOP taking these medications    losartan-hydrochlorothiazide 100-25 MG tablet Commonly known as: HYZAAR       TAKE these medications    acetaminophen 500 MG tablet Commonly known as: TYLENOL Take 500  mg by mouth every 6 (six) hours as needed for mild pain, moderate pain or fever.   amoxicillin-clavulanate 875-125 MG tablet Commonly known as: AUGMENTIN Take 1 tablet by mouth 2 (two) times daily for 3 days.   aspirin EC 81 MG tablet Take by mouth daily.   Ensure Max Protein Liqd Take 330 mLs (11 oz total) by mouth 2 (two) times daily.   exemestane 25 MG tablet Commonly known as: AROMASIN Take 1 tablet (25 mg total) by mouth daily after breakfast.   furosemide 20 MG tablet Commonly known as: LASIX Take 1 tablet (20 mg total) by mouth daily.   lidocaine-prilocaine cream Commonly known as: EMLA Apply 1 Application topically as needed.   loratadine 10 MG tablet Commonly known as: CLARITIN Take 10 mg by mouth daily.   LORazepam 0.5 MG tablet Commonly known as: ATIVAN Take 1 tablet (0.5 mg total) by mouth 2 (two) times daily as needed for anxiety.   losartan 50 MG tablet Commonly known as: Cozaar Take 1 tablet (50 mg total) by mouth daily.   metoprolol tartrate 25 MG tablet Commonly known as: LOPRESSOR Take 0.5 tablets (12.5 mg total) by mouth 2 (two) times daily.   multivitamin with minerals Tabs tablet Take 1 tablet by mouth daily. Start taking on: Mar 27, 2023   omeprazole 20 MG capsule Commonly known as: PriLOSEC Take 1 capsule (20 mg total) by mouth 2 (two) times daily.   ondansetron 8 MG tablet Commonly known as: ZOFRAN Take 1 tablet (8 mg total) by mouth every 8 (eight) hours as needed for nausea.   VITAMIN D3 PO Take 1 tablet by mouth daily.  Durable Medical Equipment  (From admission, onward)           Start     Ordered   03/26/23 1326  DME Oxygen  Once       Question Answer Comment  Length of Need 6 Months   Mode or (Route) Nasal cannula   Liters per Minute 2   Frequency Continuous (stationary and portable oxygen unit needed)   Oxygen conserving device Yes   Oxygen delivery system Gas      03/26/23 1326   03/25/23  1258  For home use only DME 4 wheeled rolling walker with seat  Once       Question:  Patient needs a walker to treat with the following condition  Answer:  Decreased mobility   03/25/23 1258   03/25/23 1217  For home use only DME oxygen  Once       Question Answer Comment  Length of Need Lifetime   Mode or (Route) Nasal cannula   Liters per Minute 2   Frequency Continuous (stationary and portable oxygen unit needed)   Oxygen conserving device Yes   Oxygen delivery system Gas      03/25/23 1216            Follow-up Information     [facility] Follow up.   Contact information: [address] [place] [address] [phone]                Discharge Exam: Filed Weights   03/24/23 0858 03/25/23 0500 03/26/23 0500  Weight: 82.6 kg 80.4 kg 78.3 kg   Subjective: She continues to require O2 but otherwise feels about the same or better.  She feels ready to go home and has the resources at home needed to be successful (once she gets home O2).   Physical Exam:       Vitals:    03/25/23 2125 03/25/23 2354 03/26/23 0456 03/26/23 0500  BP: 135/74 113/67 114/71    Pulse: (!) 118 (!) 108 (!) 109    Resp:   18 18    Temp:   98.8 F (37.1 C) 97.9 F (36.6 C)    TempSrc:   Oral Oral    SpO2: 92% 93% 91%    Weight:       78.3 kg  Height:            General:  Appears calm and comfortable and is in NAD, on 2L [place] O2 Eyes:   EOMI, normal lids, iris ENT:  grossly normal hearing, lips & tongue, mmm Neck:  no LAD, masses or thyromegaly Cardiovascular:  RRR, no m/r/g. No LE edema.  Respiratory:   CTA bilaterally with no wheezes/rales/rhonchi.  Normal respiratory effort. Abdomen:  soft, NT, ND Skin:  no rash or induration seen on limited exam Musculoskeletal:  grossly normal tone BUE/BLE, good ROM, no bony abnormality Psychiatric:  grossly normal mood and affect, speech fluent and appropriate, AOx3 Neurologic:  CN 2-12 grossly intact, moves all extremities in  coordinated fashion    Condition at discharge: improving  The results of significant diagnostics from this hospitalization (including imaging, microbiology, ancillary and laboratory) are listed below for reference.   Imaging Studies: ECHOCARDIOGRAM COMPLETE  Result Date: 03/24/2023    ECHOCARDIOGRAM REPORT   Patient Name:   JAMIEMARIE CHOWDHURY Date of Exam: 03/24/2023 Medical Rec #:  [id number]       Height:       67.5 in Accession #:    [id number]  Weight:       189.0 lb Date of Birth:  01-Mar-1961       BSA:          1.985 m Patient Age:    62 years        BP:           119/72 mmHg Patient Gender: F               HR:           110 bpm. Exam Location:  Inpatient Procedure: 2D Echo, Cardiac Doppler and Color Doppler Indications:    CHF  History:        Patient has prior history of Echocardiogram examinations, most                 recent 04/09/2022. CHF, Signs/Symptoms:Shortness of Breath and                 Edema; Risk Factors:Hypertension. Uterine sarcoma.  Sonographer:    Milda Smart Referring Phys: [id number] [name] Fava  Sonographer Comments: Suboptimal parasternal window. Image acquisition challenging due to patient body habitus and Image acquisition challenging due to respiratory motion. IMPRESSIONS  1. Left ventricular ejection fraction, by estimation, is 60 to 65%. The left ventricle has normal function. The left ventricle has no regional wall motion abnormalities. Left ventricular diastolic parameters are consistent with Grade I diastolic dysfunction (impaired relaxation).  2. Right ventricular systolic function is normal. The right ventricular size is normal. There is normal pulmonary artery systolic pressure. The estimated right ventricular systolic pressure is 22.2 mmHg.  3. The mitral valve is normal in structure. No evidence of mitral valve regurgitation. No evidence of mitral stenosis.  4. The aortic valve was not well visualized. Aortic valve regurgitation is not visualized. No aortic  stenosis is present.  5. The inferior vena cava is normal in size with greater than 50% respiratory variability, suggesting right atrial pressure of 3 mmHg. FINDINGS  Left Ventricle: Left ventricular ejection fraction, by estimation, is 60 to 65%. The left ventricle has normal function. The left ventricle has no regional wall motion abnormalities. The left ventricular internal cavity size was normal in size. There is  no left ventricular hypertrophy. Left ventricular diastolic parameters are consistent with Grade I diastolic dysfunction (impaired relaxation). Right Ventricle: The right ventricular size is normal. No increase in right ventricular wall thickness. Right ventricular systolic function is normal. There is normal pulmonary artery systolic pressure. The tricuspid regurgitant velocity is 2.19 m/s, and  with an assumed right atrial pressure of 3 mmHg, the estimated right ventricular systolic pressure is 22.2 mmHg. Left Atrium: Left atrial size was normal in size. Right Atrium: Right atrial size was normal in size. Pericardium: There is no evidence of pericardial effusion. Mitral Valve: The mitral valve is normal in structure. No evidence of mitral valve regurgitation. No evidence of mitral valve stenosis. Tricuspid Valve: The tricuspid valve is normal in structure. Tricuspid valve regurgitation is trivial. Aortic Valve: The aortic valve was not well visualized. Aortic valve regurgitation is not visualized. No aortic stenosis is present. Pulmonic Valve: The pulmonic valve was normal in structure. Pulmonic valve regurgitation is not visualized. Aorta: The aortic root is normal in size and structure. Venous: The inferior vena cava is normal in size with greater than 50% respiratory variability, suggesting right atrial pressure of 3 mmHg. IAS/Shunts: No atrial level shunt detected by color flow Doppler.  LEFT VENTRICLE PLAX 2D LVIDd:  3.90 cm   Diastology LVIDs:         3.00 cm   LV e' medial:  5.55 cm/s  LV PW:         0.80 cm   LV e' lateral: 9.03 cm/s LV IVS:        0.90 cm LVOT diam:     2.00 cm LV SV:         63 LV SV Index:   32 LVOT Area:     3.14 cm  RIGHT VENTRICLE             IVC RV S prime:     11.40 cm/s  IVC diam: 1.80 cm LEFT ATRIUM             Index        RIGHT ATRIUM           Index LA diam:        2.90 cm 1.46 cm/m   RA Area:     11.50 cm LA Vol (A2C):   40.8 ml 20.58 ml/m  RA Volume:   25.00 ml  12.59 ml/m LA Vol (A4C):   27.3 ml 13.75 ml/m LA Biplane Vol: 33.7 ml 16.98 ml/m  AORTIC VALVE LVOT Vmax:   106.00 cm/s LVOT Vmean:  79.500 cm/s LVOT VTI:    0.200 m  AORTA Ao Root diam: 3.00 cm Ao Asc diam:  3.10 cm TRICUSPID VALVE TR Peak grad:   19.2 mmHg TR Vmax:        219.00 cm/s  SHUNTS Systemic VTI:  0.20 m Systemic Diam: 2.00 cm Dalton McleanMD Electronically signed by Wilfred Lacy Signature Date/Time: 03/24/2023/12:13:30 PM    Final    CT Angio Chest PE W and/or Wo Contrast  Result Date: 03/23/2023 CLINICAL DATA:  Severe shortness of breath. EXAM: CT ANGIOGRAPHY CHEST WITH CONTRAST TECHNIQUE: Multidetector CT imaging of the chest was performed using the standard protocol during bolus administration of intravenous contrast. Multiplanar CT image reconstructions and MIPs were obtained to evaluate the vascular anatomy. RADIATION DOSE REDUCTION: This exam was performed according to the departmental dose-optimization program which includes automated exposure control, adjustment of the mA and/or kV according to patient size and/or use of iterative reconstruction technique. CONTRAST:  80mL OMNIPAQUE IOHEXOL 350 MG/ML SOLN COMPARISON:  CT chest abdomen and pelvis 03/02/2023 FINDINGS: Cardiovascular: Satisfactory opacification of the pulmonary arteries to the segmental level. No evidence of pulmonary embolism. Normal heart size. No pericardial effusion. Right chest port catheter tip ends in the SVC. Mediastinum/Nodes: No enlarged mediastinal, hilar, or axillary lymph nodes. Thyroid gland,  trachea, and esophagus demonstrate no significant findings. Lungs/Pleura: There are moderate bilateral pleural effusions. Multifocal ground-glass opacities are seen throughout both lungs. There is some peripheral smooth interlobular septal thickening. Minimal patchy slightly nodular airspace opacities are seen in the central lower lobes bilaterally. There is no pneumothorax. Upper Abdomen: No acute abnormality. Musculoskeletal: No chest wall abnormality. No acute or significant osseous findings. Review of the MIP images confirms the above findings. IMPRESSION: 1. No evidence for pulmonary embolism. 2. Moderate bilateral pleural effusions. 3. Multifocal ground-glass opacities throughout both lungs with peripheral smooth interlobular septal thickening. Findings are favored as pulmonary edema. Atypical infection can not be excluded. 4. Minimal patchy slightly nodular airspace opacities in the central lower lobes may be infectious/inflammatory. Electronically Signed   By: Darliss Cheney M.D.   On: 03/23/2023 23:02   DG Chest Portable 1 View  Result Date: 03/23/2023 CLINICAL DATA:  Shortness of  breath. History of cervical cancer. EXAM: PORTABLE CHEST 1 VIEW COMPARISON:  Chest radiograph earlier today. Chest CT 03/02/2023 FINDINGS: Right chest port remains in place. The heart is normal in size. Stable mediastinal contours. There are patchy bilateral pulmonary opacities. Question of small pleural effusions. Normal pulmonary vasculature. No pneumothorax. IMPRESSION: Patchy bilateral pulmonary opacities, suspicious for pneumonia. Electronically Signed   By: Narda Rutherford M.D.   On: 03/23/2023 20:59   CT CHEST ABDOMEN PELVIS W CONTRAST  Result Date: 03/03/2023 CLINICAL DATA:  Cervical cancer * Tracking Code: BO *. Assess treatment response. Current chemotherapy. Prior total hysterectomy EXAM: CT CHEST, ABDOMEN, AND PELVIS WITH CONTRAST TECHNIQUE: Multidetector CT imaging of the chest, abdomen and pelvis was performed  following the standard protocol during bolus administration of intravenous contrast. RADIATION DOSE REDUCTION: This exam was performed according to the departmental dose-optimization program which includes automated exposure control, adjustment of the mA and/or kV according to patient size and/or use of iterative reconstruction technique. CONTRAST:  OMNIPAQUE IOHEXOL 300 MG/ML  SOLN COMPARISON:  CT 12/01/2022 and older FINDINGS: CT CHEST FINDINGS Cardiovascular: Right upper chest port. Heart is nonenlarged. Trace pericardial fluid. Normal caliber thoracic aorta. Mediastinum/Nodes: No specific abnormal lymph node enlargement seen in the axillary region, hilum or mediastinum. Mildly patulous thoracic esophagus. Thyroid gland is unremarkable. Lungs/Pleura: Tiny pleural effusions are seen, left-greater-than-right. These new from previous. There is some patchy parenchymal opacity identified in the superior segment of the lower lobes, left-greater-than-right. This could be infiltrative rather than neoplastic. Recommend short follow-up. There were some small nodules identified on the prior. For example 3 mm nodule lateral left lower lobe subpleural on series 7, image 91 is stable. Right lower lobe subpleural nodule on the prior which measured 9 x 6 mm, today is stable on image 73 of series 7. Stable 2-3 mm nodule right lower lobe on series 7, image 97. Musculoskeletal: Curvature of the spine with some degenerative changes. CT ABDOMEN PELVIS FINDINGS Hepatobiliary: Stable dome hepatic cyst. No new space-occupying lesion in the liver. Patent portal vein. Gallbladder is distended. Pancreas: Unremarkable. No pancreatic ductal dilatation or surrounding inflammatory changes. Spleen: Spleen is slightly enlarged at 13.1 cm in AP length. Preserved enhancement. Small splenule inferiorly. Adrenals/Urinary Tract: The adrenal glands are preserved. Tiny low-attenuation cystic lesions are seen which are too small to completely  characterize along the kidneys. Bosniak 2 lesions. No specific imaging follow-up. The ureters have normal course and caliber down to the bladder. Bladder wall is diffusely thickened. The bladder is contracted. There is significant stranding. This is progressive from the previous examination. Stomach/Bowel: Large bowel has a normal course and caliber with scattered stool. Overall moderate stool burden. Normal appendix extends inferior to the cecum in the right lower quadrant. The stomach and small bowel are nondilated. Vascular/Lymphatic: Normal caliber aorta and IVC with some vascular calcifications. No specific abnormal lymph node enlargement identified in the abdomen and pelvis. Reproductive: Absence of the uterus and ovaries. Other: Once again there is a pelvic soft tissue nodule anteriorly on series 2, image 104 which on the prior measured 2.1 x 1.6 cm and today 1.9 x 1.3 cm. Smaller foci more caudal on axial image 111 and 112 are stable. Musculoskeletal: Curvature and degenerative changes are seen along the spine. Degenerative changes along the pelvis. IMPRESSION: Stable pelvic peritoneal nodules. No new lymph node enlargement seen in the chest, abdomen or pelvis. New tiny pleural effusions, left-greater-than-right with some ill-defined parenchymal opacities along the lower lobes. Although there is a differential  this could be infectious or inflammatory. Recommend short follow-up and correlation to specific symptoms. Previous tiny lung nodules are stable. Continued follow-up surveillance as per the patient's neoplasm. Mild splenomegaly. Worsening bladder wall thickening and stranding. Please correlate with symptoms and treatment Electronically Signed   By: Karen Kays M.D.   On: 03/03/2023 18:23    Microbiology: Results for orders placed or performed during the hospital encounter of 03/23/23  Blood culture (routine x 2)     Status: None (Preliminary result)   Collection Time: 03/23/23 10:03 PM    Specimen: BLOOD  Result Value Ref Range Status   Specimen Description   Final    BLOOD LEFT ANTECUBITAL Performed at [facility], [address], [place], [place] [address]    Special Requests   Final    BOTTLES DRAWN AEROBIC AND ANAEROBIC Blood Culture adequate volume Performed at [facility], [address], [place], [place] [address]    Culture   Final    NO GROWTH 3 DAYS Performed at [facility], [address], [place], [place] [address]    Report Status PENDING  Incomplete  Blood culture (routine x 2)     Status: None (Preliminary result)   Collection Time: 03/23/23 10:12 PM   Specimen: BLOOD  Result Value Ref Range Status   Specimen Description   Final    BLOOD RIGHT ANTECUBITAL Performed at [facility], [address], [place], [place] [address]    Special Requests   Final    BOTTLES DRAWN AEROBIC AND ANAEROBIC Blood Culture results may not be optimal due to an excessive volume of blood received in culture bottles Performed at [facility], [address], [place], [place] [address]    Culture   Final    NO GROWTH 3 DAYS Performed at [facility], [address] [place], [place] [address]    Report Status PENDING  Incomplete    Discharge time spent: greater than 30 minutes.  Signed: Jonah Blue, MD [facility] 03/26/2023

## 2023-03-26 NOTE — Assessment & Plan Note (Addendum)
Nutrition Problem: Severe Malnutrition Etiology: chronic illness Signs/Symptoms: percent weight loss, energy intake < 75% for > or equal to 1 month Percent weight loss: 16 % (in 4 months) Interventions: Education, Refer to RD note for recommendations, MVI, Other (Comment) (ensure max)

## 2023-03-26 NOTE — Progress Notes (Addendum)
SATURATION QUALIFICATIONS: (This note is used to comply with regulatory documentation for home oxygen)  Patient Saturations on Room Air at Rest = 85%  Patient Saturations on Room Air while Ambulating = 73%  Patient Saturations on 2 Liters of oxygen while Ambulating = 92%  Please briefly explain why patient needs home oxygen: patient oxygen level drops below 92% on room air while resting and ambulating, which prevents patient from doing activities of daily living.

## 2023-03-26 NOTE — Assessment & Plan Note (Addendum)
-  Patient previously underwent treatment with chemotherapy, currently on chemo holiday -Recently started on antiestrogen therapy as maintenance

## 2023-03-28 LAB — CULTURE, BLOOD (ROUTINE X 2): Special Requests: ADEQUATE

## 2023-03-29 ENCOUNTER — Other Ambulatory Visit (HOSPITAL_COMMUNITY): Payer: Managed Care, Other (non HMO)

## 2023-03-29 ENCOUNTER — Other Ambulatory Visit: Payer: Self-pay | Admitting: Hematology and Oncology

## 2023-03-29 ENCOUNTER — Other Ambulatory Visit: Payer: Self-pay

## 2023-03-29 ENCOUNTER — Telehealth: Payer: Self-pay

## 2023-03-29 DIAGNOSIS — I5032 Chronic diastolic (congestive) heart failure: Secondary | ICD-10-CM

## 2023-03-29 DIAGNOSIS — C549 Malignant neoplasm of corpus uteri, unspecified: Secondary | ICD-10-CM

## 2023-03-29 DIAGNOSIS — R000 Tachycardia, unspecified: Secondary | ICD-10-CM

## 2023-03-29 MED ORDER — FUROSEMIDE 20 MG PO TABS: 20.0000 mg | ORAL_TABLET | Freq: Every day | ORAL | 3 refills | Status: DC

## 2023-03-29 NOTE — Telephone Encounter (Signed)
Left a message referral sent to cardiology at [facility] per her request to Dr. Cristal Deer.

## 2023-03-29 NOTE — Telephone Encounter (Signed)
Called and given below message Rx sent. She would like to follow up with cardiology. Whoever Dr. Bertis Ruddy recommends.

## 2023-03-29 NOTE — Telephone Encounter (Signed)
After speaking with her husband. She would like referral to cardiology, Dr. Jodelle Red at [facility]. This is her husbands cardiologist.

## 2023-03-29 NOTE — Telephone Encounter (Signed)
Called and given below message. She is still recovering from being in the hospital. She is using 2 liters n/c all of time. Sometimes when she ambulates her oxygen saturation down to 90%. She is still taking antibiotics and will complete tomorrow.   She is able to eat/ drink with no problems.  She will call PCP and get follow up appt for next week.  She denies edema. Voiding with no problems. She is weighing herself daily.  She is taking lasix 20 mg daily. She has 5 more days left of the Rx. She is asking for refill to [facility].

## 2023-03-29 NOTE — Telephone Encounter (Signed)
-----   Message from Artis Delay, MD sent at 03/29/2023  8:56 AM EDT ----- [facility] got DC last week How is she doing?

## 2023-03-29 NOTE — Telephone Encounter (Signed)
Looks like there;s cardiology in [place] so I created a referral

## 2023-03-29 NOTE — Telephone Encounter (Signed)
Please refill furosemide for 30 days, with 3 refills Let us know if she is interested to cardiology outpatient follow-up

## 2023-03-29 NOTE — Telephone Encounter (Signed)
Can you help me fix the referral then?

## 2023-04-05 ENCOUNTER — Encounter (HOSPITAL_COMMUNITY): Payer: Self-pay | Admitting: Emergency Medicine

## 2023-04-05 ENCOUNTER — Ambulatory Visit (HOSPITAL_COMMUNITY)
Admission: RE | Admit: 2023-04-05 | Discharge: 2023-04-05 | Disposition: A | Discharge status: Home / Self Care | Payer: Managed Care, Other (non HMO) | Source: Ambulatory Visit | Attending: Hematology and Oncology | Admitting: Hematology and Oncology

## 2023-04-05 DIAGNOSIS — Z780 Asymptomatic menopausal state: Secondary | ICD-10-CM | POA: Insufficient documentation

## 2023-04-05 DIAGNOSIS — M8580 Other specified disorders of bone density and structure, unspecified site: Secondary | ICD-10-CM | POA: Diagnosis present

## 2023-04-09 ENCOUNTER — Other Ambulatory Visit: Payer: Self-pay | Admitting: Hematology and Oncology

## 2023-04-19 ENCOUNTER — Other Ambulatory Visit (HOSPITAL_COMMUNITY): Payer: Self-pay

## 2023-04-20 ENCOUNTER — Other Ambulatory Visit (HOSPITAL_COMMUNITY): Payer: Self-pay

## 2023-04-23 ENCOUNTER — Inpatient Hospital Stay (HOSPITAL_BASED_OUTPATIENT_CLINIC_OR_DEPARTMENT_OTHER): Payer: Managed Care, Other (non HMO) | Attending: Gynecologic Oncology | Admitting: Hematology and Oncology

## 2023-04-23 ENCOUNTER — Other Ambulatory Visit: Payer: Self-pay

## 2023-04-23 ENCOUNTER — Ambulatory Visit: Payer: Managed Care, Other (non HMO) | Admitting: Hematology and Oncology

## 2023-04-23 ENCOUNTER — Encounter: Payer: Self-pay | Admitting: Hematology and Oncology

## 2023-04-23 ENCOUNTER — Other Ambulatory Visit: Payer: Managed Care, Other (non HMO)

## 2023-04-23 ENCOUNTER — Inpatient Hospital Stay: Payer: Managed Care, Other (non HMO) | Attending: Gynecologic Oncology

## 2023-04-23 VITALS — BP 106/68 | HR 85 | Temp 98.0°F | Resp 18 | Ht 67.5 in | Wt 173.8 lb

## 2023-04-23 DIAGNOSIS — D649 Anemia, unspecified: Secondary | ICD-10-CM | POA: Insufficient documentation

## 2023-04-23 DIAGNOSIS — C549 Malignant neoplasm of corpus uteri, unspecified: Secondary | ICD-10-CM | POA: Diagnosis not present

## 2023-04-23 DIAGNOSIS — C55 Malignant neoplasm of uterus, part unspecified: Secondary | ICD-10-CM | POA: Insufficient documentation

## 2023-04-23 DIAGNOSIS — I10 Essential (primary) hypertension: Secondary | ICD-10-CM | POA: Insufficient documentation

## 2023-04-23 DIAGNOSIS — Z79811 Long term (current) use of aromatase inhibitors: Secondary | ICD-10-CM | POA: Insufficient documentation

## 2023-04-23 DIAGNOSIS — M8580 Other specified disorders of bone density and structure, unspecified site: Secondary | ICD-10-CM

## 2023-04-23 LAB — CBC WITH DIFFERENTIAL/PLATELET
Abs Immature Granulocytes: 0.01 10*3/uL (ref 0.00–0.07)
Basophils Absolute: 0.1 10*3/uL (ref 0.0–0.1)
Basophils Relative: 1 %
Eosinophils Absolute: 1.3 10*3/uL — ABNORMAL HIGH (ref 0.0–0.5)
Eosinophils Relative: 24 %
HCT: 35.2 % — ABNORMAL LOW (ref 36.0–46.0)
Hemoglobin: 11.5 g/dL — ABNORMAL LOW (ref 12.0–15.0)
Immature Granulocytes: 0 %
Lymphocytes Relative: 27 %
Lymphs Abs: 1.5 10*3/uL (ref 0.7–4.0)
MCH: 28.7 pg (ref 26.0–34.0)
MCHC: 32.7 g/dL (ref 30.0–36.0)
MCV: 87.8 fL (ref 80.0–100.0)
Monocytes Absolute: 0.5 10*3/uL (ref 0.1–1.0)
Monocytes Relative: 10 %
Neutro Abs: 2.1 10*3/uL (ref 1.7–7.7)
Neutrophils Relative %: 38 %
Platelets: 191 10*3/uL (ref 150–400)
RBC: 4.01 MIL/uL (ref 3.87–5.11)
RDW: 17.7 % — ABNORMAL HIGH (ref 11.5–15.5)
WBC: 5.5 10*3/uL (ref 4.0–10.5)
nRBC: 0 % (ref 0.0–0.2)

## 2023-04-23 LAB — COMPREHENSIVE METABOLIC PANEL
ALT: 10 U/L (ref 0–44)
AST: 17 U/L (ref 15–41)
Albumin: 3.5 g/dL (ref 3.5–5.0)
Alkaline Phosphatase: 73 U/L (ref 38–126)
Anion gap: 5 (ref 5–15)
BUN: 21 mg/dL (ref 8–23)
CO2: 30 mmol/L (ref 22–32)
Calcium: 9.7 mg/dL (ref 8.9–10.3)
Chloride: 101 mmol/L (ref 98–111)
Creatinine, Ser: 0.63 mg/dL (ref 0.44–1.00)
GFR, Estimated: >60 mL/min (ref >60)
Glucose, Bld: 114 mg/dL — ABNORMAL HIGH (ref 70–99)
Potassium: 3.8 mmol/L (ref 3.5–5.1)
Sodium: 136 mmol/L (ref 135–145)
Total Bilirubin: 0.4 mg/dL (ref 0.3–1.2)
Total Protein: 7 g/dL (ref 6.5–8.1)

## 2023-04-23 MED ORDER — SODIUM CHLORIDE 0.9% FLUSH
10.0000 mL | Freq: Once | INTRAVENOUS | Status: AC
  Administered 2023-04-23: 10 mL

## 2023-04-23 MED ORDER — HEPARIN SOD (PORK) LOCK FLUSH 100 UNIT/ML IV SOLN
500.0000 [IU] | Freq: Once | INTRAVENOUS | Status: AC
  Administered 2023-04-23: 500 [IU]

## 2023-04-23 NOTE — Assessment & Plan Note (Signed)
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

## 2023-04-23 NOTE — Progress Notes (Signed)
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 Anemia has improved and clinically, she felt better I am hoping we can slowly taper her off some medications for hypertension as her blood pressure is now trending low I plan to repeat imaging study in August She will continue antiestrogen therapy  Essential hypertension Her blood pressure is low She is no longer symptomatic with shortness of breath except on exertion and not retaining much fluid We discussed potential medication taper in the future  Orders Placed This Encounter  Procedures   CT CHEST ABDOMEN PELVIS W CONTRAST    Standing Status:   Future    Standing Expiration Date:   04/22/2024    Order Specific Question:   If indicated for the ordered procedure, I authorize the administration of contrast media per Radiology protocol    Answer:   Yes    Order Specific Question:   Does the patient have a contrast media/X-ray dye allergy?    Answer:   No    Order Specific Question:   Preferred imaging location?    Answer:   [facility]    Order Specific Question:   If indicated for the ordered procedure, I authorize the administration of oral contrast media per Radiology protocol    Answer:   Yes    All questions were answered. The patient knows to call the clinic with any problems, questions or concerns. The total time spent in the appointment was 25 minutes encounter with patients including review of chart and various tests results, discussions about plan of care and coordination of care plan   Artis Delay, MD 04/23/2023 9:34 AM  INTERVAL HISTORY: Please see below for problem oriented charting. she returns for treatment follow-up She tolerated  Aromasin well She is feeling better and using less oxygen She has mild shortness of breath on exertion Leg swelling has improved  REVIEW OF SYSTEMS:   Constitutional: Denies fevers, chills or abnormal weight loss Eyes: Denies blurriness of vision Ears, nose, mouth, throat, and face: Denies mucositis or sore throat Cardiovascular: Denies palpitation, chest discomfort or lower extremity swelling Gastrointestinal:  Denies nausea, heartburn or change in bowel habits Skin: Denies abnormal skin rashes Lymphatics: Denies new lymphadenopathy or easy bruising Neurological:Denies numbness, tingling or new weaknesses Behavioral/Psych: Mood is stable, no new changes  All other systems were reviewed with the patient and are negative.  I have reviewed the past medical history, past surgical history, social history and family history with the patient and they are unchanged from previous note.  ALLERGIES:  has No Known Allergies.  MEDICATIONS:  Current Outpatient Medications  Medication Sig Dispense Refill   acetaminophen (TYLENOL) 500 MG tablet Take 500 mg by mouth every 6 (six) hours as needed for mild pain, moderate pain or fever.     aspirin 81 MG EC tablet Take by mouth daily.     Cholecalciferol (VITAMIN D3 PO) Take 1 tablet by mouth daily.     Ensure Max Protein (ENSURE MAX PROTEIN) LIQD Take 330 mLs (11 oz total) by mouth 2 (two) times daily. 19800 mL 0   exemestane (AROMASIN) 25 MG tablet TAKE 1 TABLET (25 MG TOTAL) BY MOUTH DAILY AFTER BREAKFAST. 90 tablet 1   furosemide (LASIX)  20 MG tablet Take 1 tablet (20 mg total) by mouth daily. 30 tablet 3   lidocaine-prilocaine (EMLA) cream Apply 1 Application topically as needed. 30 g 3   loratadine (CLARITIN) 10 MG tablet Take 10 mg by mouth daily.     LORazepam (ATIVAN) 0.5 MG tablet Take 1 tablet (0.5 mg total) by mouth 2 (two) times daily as needed for anxiety. 10 tablet 0   losartan (COZAAR) 50 MG tablet Take 1 tablet (50 mg total) by mouth  daily. 30 tablet 1   metoprolol tartrate (LOPRESSOR) 25 MG tablet Take 0.5 tablets (12.5 mg total) by mouth 2 (two) times daily. 60 tablet 1   Multiple Vitamin (MULTIVITAMIN WITH MINERALS) TABS tablet Take 1 tablet by mouth daily. 30 tablet 1   omeprazole (PRILOSEC) 20 MG capsule Take 1 capsule (20 mg total) by mouth 2 (two) times daily. 28 capsule 0   ondansetron (ZOFRAN) 8 MG tablet Take 1 tablet (8 mg total) by mouth every 8 (eight) hours as needed for nausea. 30 tablet 3   No current facility-administered medications for this visit.    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 No extra uterine extension. No evidence of metastatic disease within the pelvis.   Mild thickening of the endometrial stripe, likely related to entrapment by the adjacent uterine mass   06/03/2021 Initial Diagnosis   Uterine sarcoma (HCC)   06/03/2021 Cancer Staging   Staging form: Corpus Uteri - Sarcoma, AJCC 7th Edition - Clinical stage from 06/03/2021: FIGO Stage IVB (rT1b, N0, M1) - Signed by Artis Delay, MD on 03/31/2022 Diagnostic confirmation: Positive histology Stage prefix: Recurrence Biopsy of metastatic site performed: No Lymph-vascular invasion (LVI): LVI present/identified, NOS   06/03/2021 Pathology Results   FINAL MICROSCOPIC DIAGNOSIS:   A. UTERUS, CERVIX, BILATERAL FALLOPIAN TUBES AND OVARIES, HYSTERECTOMY:  - Uterus:       Mixed high grade uterine sarcoma, spanning 6 cm, see comment.       Extensive lymphovascular invasion.       See oncology table.  - Cervix: Benign squamous and endocervical mucosa. No  dysplasia or  malignancy.  - Bilateral ovaries: Unremarkable. No malignancy.  - Bilateral fallopian tubes: Unremarkable. No malignancy.   ONCOLOGY TABLE:   UTERUS, SARCOMA: Resection   Procedure: Total hysterectomy and bilateral salpingo-oophorectomy  Specimen Integrity: Focally disrupted on posterior surface  Tumor Site: Anterior wall  Tumor Size: 6 cm  Histologic Type: High grade sarcoma, mixed. See comment.  Other Tissue/ Organ Involvement: Not identified  Lymphovascular Invasion: Present, extensive.  Margins: All margins negative for tumor  Regional Lymph Nodes: Not applicable (no lymph nodes submitted or found)  Distant Metastasis:       Distant Site(s) Involved: Not applicable  Pathologic Stage Classification (pTNM, AJCC 8th Edition): pT1b, pN not  assigned  Ancillary Studies: Can be performed upon request  Representative Tumor Block: A6  Comment(s): The tumor has two morphologically different components. One component consists of malignant spindle cells which can be seen arising from the smooth muscle. These foci are positive for SMA, desmin, cyclinD1 (focal), and CD10 (patchy). The other component consists of round to slightly spindle cells with abundant admixed vessels and occasional large  pleomorphic cells. These areas are positive for SMA (patchy weak), desmin (focal), CD10 (variable with diffuse areas). Pancytokeratin is negative. 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   ADDENDUM:   PROGNOSTIC INDICATOR RESULTS:   Immunohistochemical and morphometric analysis performed manually    Estrogen Receptor:       POSITIVE, 50%, WEAK TO MODERATE STAINING  Progesterone Receptor:   POSITIVE, 50%, MODERATE TO STRONG STAINING   Reference Range Estrogen and Progesterone Receptor       Negative  0%       Positive  >1%    06/03/2021 Surgery   Surgeon: Quinn Axe    Assistants: Dr Antionette Char (an MD assistant was necessary for tissue manipulation, management of robotic instrumentation, retraction and positioning due to the complexity of the case and hospital policies).  Operation: Robotic-assisted laparoscopic total hysterectomy >250gm with bilateral salpingoophorectomy, minilaparotomy for specimen delivery   Surgeon: Quinn Axe    Operative Findings:  : Bulky 16cm uterus with intra-uterine mass (consistent with a fibroid-like mass) , smooth normal appearing serosa, no suspicious bulky nodes. Normal ovaries bilaterally. Normal upper abdomen. Uterus too large to deliver vaginally in tact.    06/18/2021 Imaging   1. Multiple bilateral pulmonary nodules, measuring up to 9 mm. Most of these are perifissural and subpleural in location, likely lymph nodes. Nevertheless, close follow-up recommended to exclude metastatic disease. 2. 11 mm hypoattenuating lesion towards the dome of the liver, indeterminate. MRI abdomen with and without contrast could be used to further evaluate as clinically warranted. 3. Aortic Atherosclerosis (ICD10-I70.0).   10/13/2021 Imaging   1. No acute findings within the abdomen or pelvis. No specific findings identified to suggest residual or recurrence of tumor. 2. Stable small pulmonary nodules within the left lower lobe.   03/18/2022 Imaging   1. Interval development of 2 soft tissue nodules in the anterior pelvis measuring 10 mm and 7 mm respectively. Close follow-up recommended to exclude metastatic disease. PET-CT may be warranted to further evaluate. 2. Development of 2 mm left upper lobe parenchymal nodule, nonspecific. Close attention on follow-up imaging recommended  3. Stable appearance of index bilateral pulmonary nodules identified previously. 4. Aortic Atherosclerosis (ICD10-I70.0).   04/02/2022 PET scan   1. Signs of local tumor recurrence noted at the vaginal cuff. 2. 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 New since 10/13/2021 and progressive when compared with 03/17/2022. 3. New focal area of increased uptake within the right biceps femoris muscle which is suspicious for skeletal muscle involvement. 4. Stable appearance of small pulmonary nodules described on 03/17/2022. These are too small to characterize by PET-CT.   04/09/2022 Procedure   Successful placement of a right IJ approach Power Port with ultrasound and fluoroscopic guidance. The catheter is ready for use.   04/09/2022 Echocardiogram    1. Left ventricular ejection fraction, by estimation, is 65 to 70%. The left ventricle has normal function. The left ventricle has no regional wall motion abnormalities. There is mild concentric left ventricular hypertrophy. Left ventricular diastolic parameters are consistent with Grade I diastolic dysfunction (impaired relaxation).  2. Right ventricular systolic function is normal. The right ventricular size is normal.  3. The mitral valve is normal in structure. No evidence of mitral valve regurgitation.  4. The aortic valve is tricuspid. Aortic valve regurgitation is not visualized. No aortic stenosis is present.  5. The inferior vena cava is normal in size with greater  than 50% respiratory variability, suggesting right atrial pressure of 3 mmHg.   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 Status post hysterectomy and bilateral oophorectomy. Multiple newand enlarged peritoneal nodules throughout the low abdomen and pelvis, previously FDG avid and consistent with worsened locally recurrent and peritoneal metastatic disease. 2. Multiple small bilateral pulmonary nodules, several of which are slightly enlarged compared to prior examination, consistent with worsened pulmonary metastatic disease.   Aortic Atherosclerosis (ICD10-I70.0).     06/25/2022 - 07/06/2022 Chemotherapy    Patient is on Treatment Plan : UTERINE UNDIFFERENTIATED / LEIOMYOSARCOMA Gemcitabine D1,8 + Docetaxel D8 (900/100) q21d     06/25/2022 - 03/04/2023 Chemotherapy   Patient is on Treatment Plan : UTERINE UNDIFFERENTIATED LEIOMYOSARCOMA Gemcitabine D1,8 + Docetaxel D8 (900/100) q21d     09/02/2022 Imaging   1. Moderate response to therapy of peritoneal metastasis within the anterior pelvis. 2. Minimal response to therapy of pulmonary metastasis. 3. No new or progressive disease. 4.  Aortic Atherosclerosis (ICD10-I70.0).     12/01/2022 Imaging   1. No new or progressive findings in the chest, abdomen, or pelvis. 2. Peritoneal metastases in the pelvis described previously measure smaller today compatible with interval response to therapy. 3. Stable tiny bilateral pulmonary nodules. Previously characterized as metastatic lesions, these are unchanged in the interval. No new suspicious pulmonary nodule or mass. 4.  Aortic Atherosclerois (ICD10-170.0)   03/04/2023 Imaging   CT CHEST ABDOMEN PELVIS W CONTRAST  Result Date: 03/03/2023 CLINICAL DATA:  Cervical cancer * Tracking Code: BO *. Assess treatment response. Current chemotherapy. Prior total hysterectomy EXAM: CT CHEST, ABDOMEN, AND PELVIS WITH CONTRAST TECHNIQUE: Multidetector CT imaging of the chest, abdomen and pelvis was performed following the standard protocol during bolus administration of intravenous contrast. RADIATION DOSE REDUCTION: This exam was performed according to the departmental dose-optimization program which includes automated exposure control, adjustment of the mA and/or kV according to patient size and/or use of iterative reconstruction technique. CONTRAST:  OMNIPAQUE IOHEXOL 300 MG/ML  SOLN COMPARISON:  CT 12/01/2022 and older FINDINGS: CT CHEST FINDINGS Cardiovascular: Right upper chest port. Heart is nonenlarged. Trace pericardial fluid. Normal caliber thoracic aorta. Mediastinum/Nodes: No specific abnormal lymph node  enlargement seen in the axillary region, hilum or mediastinum. Mildly patulous thoracic esophagus. Thyroid gland is unremarkable. Lungs/Pleura: Tiny pleural effusions are seen, left-greater-than-right. These new from previous. There is some patchy parenchymal opacity identified in the superior segment of the lower lobes, left-greater-than-right. This could be infiltrative rather than neoplastic. Recommend short follow-up. There were some small nodules identified on the prior. For example 3 mm nodule lateral left lower lobe subpleural on series 7, image 91 is stable. Right lower lobe subpleural nodule on the prior which measured 9 x 6 mm, today is stable on image 73 of series 7. Stable 2-3 mm nodule right lower lobe on series 7, image 97. Musculoskeletal: Curvature of the spine with some degenerative changes. CT ABDOMEN PELVIS FINDINGS Hepatobiliary: Stable dome hepatic cyst. No new space-occupying lesion in the liver. Patent portal vein. Gallbladder is distended. Pancreas: Unremarkable. No pancreatic ductal dilatation or surrounding inflammatory changes. Spleen: Spleen is slightly enlarged at 13.1 cm in AP length. Preserved enhancement. Small splenule inferiorly. Adrenals/Urinary Tract: The adrenal glands are preserved. Tiny low-attenuation cystic lesions are seen which are too small to completely characterize along the kidneys. Bosniak 2 lesions. No specific imaging follow-up. The ureters  have normal course and caliber down to the bladder. Bladder wall is diffusely thickened. The bladder is contracted. There is significant stranding. This is progressive from the previous examination. Stomach/Bowel: Large bowel has a normal course and caliber with scattered stool. Overall moderate stool burden. Normal appendix extends inferior to the cecum in the right lower quadrant. The stomach and small bowel are nondilated. Vascular/Lymphatic: Normal caliber aorta and IVC with some vascular calcifications. No specific abnormal  lymph node enlargement identified in the abdomen and pelvis. Reproductive: Absence of the uterus and ovaries. Other: Once again there is a pelvic soft tissue nodule anteriorly on series 2, image 104 which on the prior measured 2.1 x 1.6 cm and today 1.9 x 1.3 cm. Smaller foci more caudal on axial image 111 and 112 are stable. Musculoskeletal: Curvature and degenerative changes are seen along the spine. Degenerative changes along the pelvis. IMPRESSION: Stable pelvic peritoneal nodules. No new lymph node enlargement seen in the chest, abdomen or pelvis. New tiny pleural effusions, left-greater-than-right with some ill-defined parenchymal opacities along the lower lobes. Although there is a differential this could be infectious or inflammatory. Recommend short follow-up and correlation to specific symptoms. Previous tiny lung nodules are stable. Continued follow-up surveillance as per the patient's neoplasm. Mild splenomegaly. Worsening bladder wall thickening and stranding. Please correlate with symptoms and treatment Electronically Signed   By: Karen Kays M.D.   On: 03/03/2023 18:23      04/05/2023 Imaging   Normal bone density scan     PHYSICAL EXAMINATION: ECOG PERFORMANCE STATUS: 1 - Symptomatic but completely ambulatory  Vitals:   04/23/23 0813  BP: 106/68  Pulse: 85  Resp: 18  Temp: 98 F (36.7 C)  SpO2: 95%   Filed Weights   04/23/23 0813  Weight: 173 lb 12.8 oz (78.8 kg)    GENERAL:alert, no distress and comfortable NEURO: alert & oriented x 3 with fluent speech, no focal motor/sensory deficits  LABORATORY DATA:  I have reviewed the data as listed    Component Value Date/Time   NA 136 04/23/2023 0749   K 3.8 04/23/2023 0749   CL 101 04/23/2023 0749   CO2 30 04/23/2023 0749   GLUCOSE 114 (H) 04/23/2023 0749   BUN 21 04/23/2023 0749   CREATININE 0.63 04/23/2023 0749   CREATININE 0.85 03/18/2023 0738   CALCIUM 9.7 04/23/2023 0749   PROT 7.0 04/23/2023 0749   ALBUMIN 3.5  04/23/2023 0749   AST 17 04/23/2023 0749   AST 17 03/18/2023 0738   ALT 10 04/23/2023 0749   ALT 9 03/18/2023 0738   ALKPHOS 73 04/23/2023 0749   BILITOT 0.4 04/23/2023 0749   BILITOT 0.4 03/18/2023 0738   GFRNONAA >60 04/23/2023 0749   GFRNONAA >60 03/18/2023 0738    No results found for: "SPEP", "UPEP"  Lab Results  Component Value Date   WBC 5.5 04/23/2023   NEUTROABS 2.1 04/23/2023   HGB 11.5 (L) 04/23/2023   HCT 35.2 (L) 04/23/2023   MCV 87.8 04/23/2023   PLT 191 04/23/2023      Chemistry      Component Value Date/Time   NA 136 04/23/2023 0749   K 3.8 04/23/2023 0749   CL 101 04/23/2023 0749   CO2 30 04/23/2023 0749   BUN 21 04/23/2023 0749   CREATININE 0.63 04/23/2023 0749   CREATININE 0.85 03/18/2023 0738      Component Value Date/Time   CALCIUM 9.7 04/23/2023 0749   ALKPHOS 73 04/23/2023 0749   AST 17  04/23/2023 0749   AST 17 03/18/2023 0738   ALT 10 04/23/2023 0749   ALT 9 03/18/2023 0738   BILITOT 0.4 04/23/2023 0749   BILITOT 0.4 03/18/2023 0738       RADIOGRAPHIC STUDIES: I have personally reviewed the radiological images as listed and agreed with the findings in the report. DG BONE DENSITY (DXA)  Result Date: 04/05/2023 EXAM: DUAL X-RAY ABSORPTIOMETRY (DXA) FOR BONE MINERAL DENSITY IMPRESSION: Your patient Matrice Stansberry completed a BMD test on 04/05/2023 using the [employer] DXA System (software version: 14.10) manufactured by [employer]. The following summarizes the results of our evaluation. Technologist: BRG PATIENT BIOGRAPHICAL: Name: Lakelynn, Rasor Patient ID:  [id number] Birth Date: 1961/10/01 Height:     67.5 in. Gender:      Female    Exam Date:  04/05/2023 Weight:     172.6 lbs. Indications:           Fractures:             Treatments: Vitamin D DENSITOMETRY RESULTS: Site         Region     Measured Date Measured Age WHO Classification Young Adult T-score BMD         %Change vs. Previous Significant Change (*) DualFemur  Neck Left 2/05/2023 64.3 Normal 0.1 1.051 g/cm2 - - Left Forearm Radius 33% 2/05/2023 64.3 Normal 0.2 0.716 g/cm2 - - ASSESSMENT: The BMD measured at Femur Neck Left is 1.051 g/cm2 with a T-score of 0.1. This patient is considered normal according to World Health Organization [facility]) criteria. Patient is not a candidate for FRAX assessment due to normal classification. Lumbar spine was excluded due to advanced degenerative changes. The scan quality is good. World Science writer [facility]) criteria for post-menopausal, Caucasian Women: Normal:       T-score at or above -1 SD Osteopenia:   T-score between -1 and -2.5 SD Osteoporosis: T-score at or below -2.5 SD RECOMMENDATIONS: 1. All patients should optimize calcium and vitamin D intake. 2. Consider FDA-approved medical therapies in postmenopausal women and med aged 17 years and older, based on the following: a. A hip or vertebral (clinical or morphometric) fracture b. T-score< -2.5 at the femoral neck or spine after appropriate evaluation to exclude secondary causes c. Low bone mass (T-score between -1.0 and -2.5 at the femoral neck or spine) and a 10-year probability of a hip fracture > 3% or a 10-year probability of a major osteoporosis-related fracture > 20% based on the [place]-adapted WHO algorithm d. Clinician judgment and/or patient preferences may indicate treatment for people with 10-year fracture probabilities above or below these levels FOLLOW-UP: Patients with diagnosis of osteoporosis or at high risk for fracture should have regular bone mineral density tests. For patients eligible for Medicare, routine testing is allowed once every 2 years. The testing frequency can be increased to one year for patients who have rapidly progressing disease, those who are receiving or discontinuing medical therapy to restore bone mass, or have additional risk factors. I have reviewed this report, and agree with the above findings. [facility] Electronically  Signed   By: Frederico Hamman M.D.   On: 04/05/2023 12:54   ECHOCARDIOGRAM COMPLETE  Result Date: 03/24/2023    ECHOCARDIOGRAM REPORT   Patient Name:   ADDLYNN GAITAN Date of Exam: 03/24/2023 Medical Rec #:  [id number]       Height:       67.5 in Accession #:    [id number]  Weight:       189.0 lb Date of Birth:  Oct 13, 1961       BSA:          1.985 m Patient Age:    62 years        BP:           119/72 mmHg Patient Gender: F               HR:           110 bpm. Exam Location:  Inpatient Procedure: 2D Echo, Cardiac Doppler and Color Doppler Indications:    CHF  History:        Patient has prior history of Echocardiogram examinations, most                 recent 04/09/2022. CHF, Signs/Symptoms:Shortness of Breath and                 Edema; Risk Factors:Hypertension. Uterine sarcoma.  Sonographer:    Milda Smart Referring Phys: [id number] [name] Fava  Sonographer Comments: Suboptimal parasternal window. Image acquisition challenging due to patient body habitus and Image acquisition challenging due to respiratory motion. IMPRESSIONS  1. Left ventricular ejection fraction, by estimation, is 60 to 65%. The left ventricle has normal function. The left ventricle has no regional wall motion abnormalities. Left ventricular diastolic parameters are consistent with Grade I diastolic dysfunction (impaired relaxation).  2. Right ventricular systolic function is normal. The right ventricular size is normal. There is normal pulmonary artery systolic pressure. The estimated right ventricular systolic pressure is 22.2 mmHg.  3. The mitral valve is normal in structure. No evidence of mitral valve regurgitation. No evidence of mitral stenosis.  4. The aortic valve was not well visualized. Aortic valve regurgitation is not visualized. No aortic stenosis is present.  5. The inferior vena cava is normal in size with greater than 50% respiratory variability, suggesting right atrial pressure of 3 mmHg. FINDINGS  Left Ventricle:  Left ventricular ejection fraction, by estimation, is 60 to 65%. The left ventricle has normal function. The left ventricle has no regional wall motion abnormalities. The left ventricular internal cavity size was normal in size. There is  no left ventricular hypertrophy. Left ventricular diastolic parameters are consistent with Grade I diastolic dysfunction (impaired relaxation). Right Ventricle: The right ventricular size is normal. No increase in right ventricular wall thickness. Right ventricular systolic function is normal. There is normal pulmonary artery systolic pressure. The tricuspid regurgitant velocity is 2.19 m/s, and  with an assumed right atrial pressure of 3 mmHg, the estimated right ventricular systolic pressure is 22.2 mmHg. Left Atrium: Left atrial size was normal in size. Right Atrium: Right atrial size was normal in size. Pericardium: There is no evidence of pericardial effusion. Mitral Valve: The mitral valve is normal in structure. No evidence of mitral valve regurgitation. No evidence of mitral valve stenosis. Tricuspid Valve: The tricuspid valve is normal in structure. Tricuspid valve regurgitation is trivial. Aortic Valve: The aortic valve was not well visualized. Aortic valve regurgitation is not visualized. No aortic stenosis is present. Pulmonic Valve: The pulmonic valve was normal in structure. Pulmonic valve regurgitation is not visualized. Aorta: The aortic root is normal in size and structure. Venous: The inferior vena cava is normal in size with greater than 50% respiratory variability, suggesting right atrial pressure of 3 mmHg. IAS/Shunts: No atrial level shunt detected by color flow Doppler.  LEFT VENTRICLE PLAX 2D LVIDd:  3.90 cm   Diastology LVIDs:         3.00 cm   LV e' medial:  5.55 cm/s LV PW:         0.80 cm   LV e' lateral: 9.03 cm/s LV IVS:        0.90 cm LVOT diam:     2.00 cm LV SV:         63 LV SV Index:   32 LVOT Area:     3.14 cm  RIGHT VENTRICLE              IVC RV S prime:     11.40 cm/s  IVC diam: 1.80 cm LEFT ATRIUM             Index        RIGHT ATRIUM           Index LA diam:        2.90 cm 1.46 cm/m   RA Area:     11.50 cm LA Vol (A2C):   40.8 ml 20.58 ml/m  RA Volume:   25.00 ml  12.59 ml/m LA Vol (A4C):   27.3 ml 13.75 ml/m LA Biplane Vol: 33.7 ml 16.98 ml/m  AORTIC VALVE LVOT Vmax:   106.00 cm/s LVOT Vmean:  79.500 cm/s LVOT VTI:    0.200 m  AORTA Ao Root diam: 3.00 cm Ao Asc diam:  3.10 cm TRICUSPID VALVE TR Peak grad:   19.2 mmHg TR Vmax:        219.00 cm/s  SHUNTS Systemic VTI:  0.20 m Systemic Diam: 2.00 cm Dalton McleanMD Electronically signed by Wilfred Lacy Signature Date/Time: 03/24/2023/12:13:30 PM    Final

## 2023-04-23 NOTE — Assessment & Plan Note (Signed)
Her blood pressure is low She is no longer symptomatic with shortness of breath except on exertion and not retaining much fluid We discussed potential medication taper in the future

## 2023-04-24 ENCOUNTER — Telehealth: Payer: Self-pay | Admitting: Hematology and Oncology

## 2023-04-26 ENCOUNTER — Telehealth: Payer: Self-pay | Admitting: Hematology and Oncology

## 2023-04-26 NOTE — Telephone Encounter (Signed)
Spoke with patient confirming upcoming appointment  

## 2023-04-27 IMAGING — XA IR IMAGING GUIDED PORT INSERTION
2 series · 3 of 3 positions shown · non-contrast
Comparison: none

CLINICAL DATA: Patient with uterine carcinoma requires a
Port-A-Cath
TECHNIQUE: The right neck and chest was prepped with chlorhexidine, and draped
in the usual sterile fashion using maximum barrier technique (cap
and mask, sterile gown, sterile gloves, large sterile sheet, hand
hygiene and cutaneous antiseptic). Local anesthesia was attained by
infiltration with 1% lidocaine and with epinephrine.

[Series 1: ir fluoro/shunt/fist · 2 of 2 slices shown]
[im 1/2]
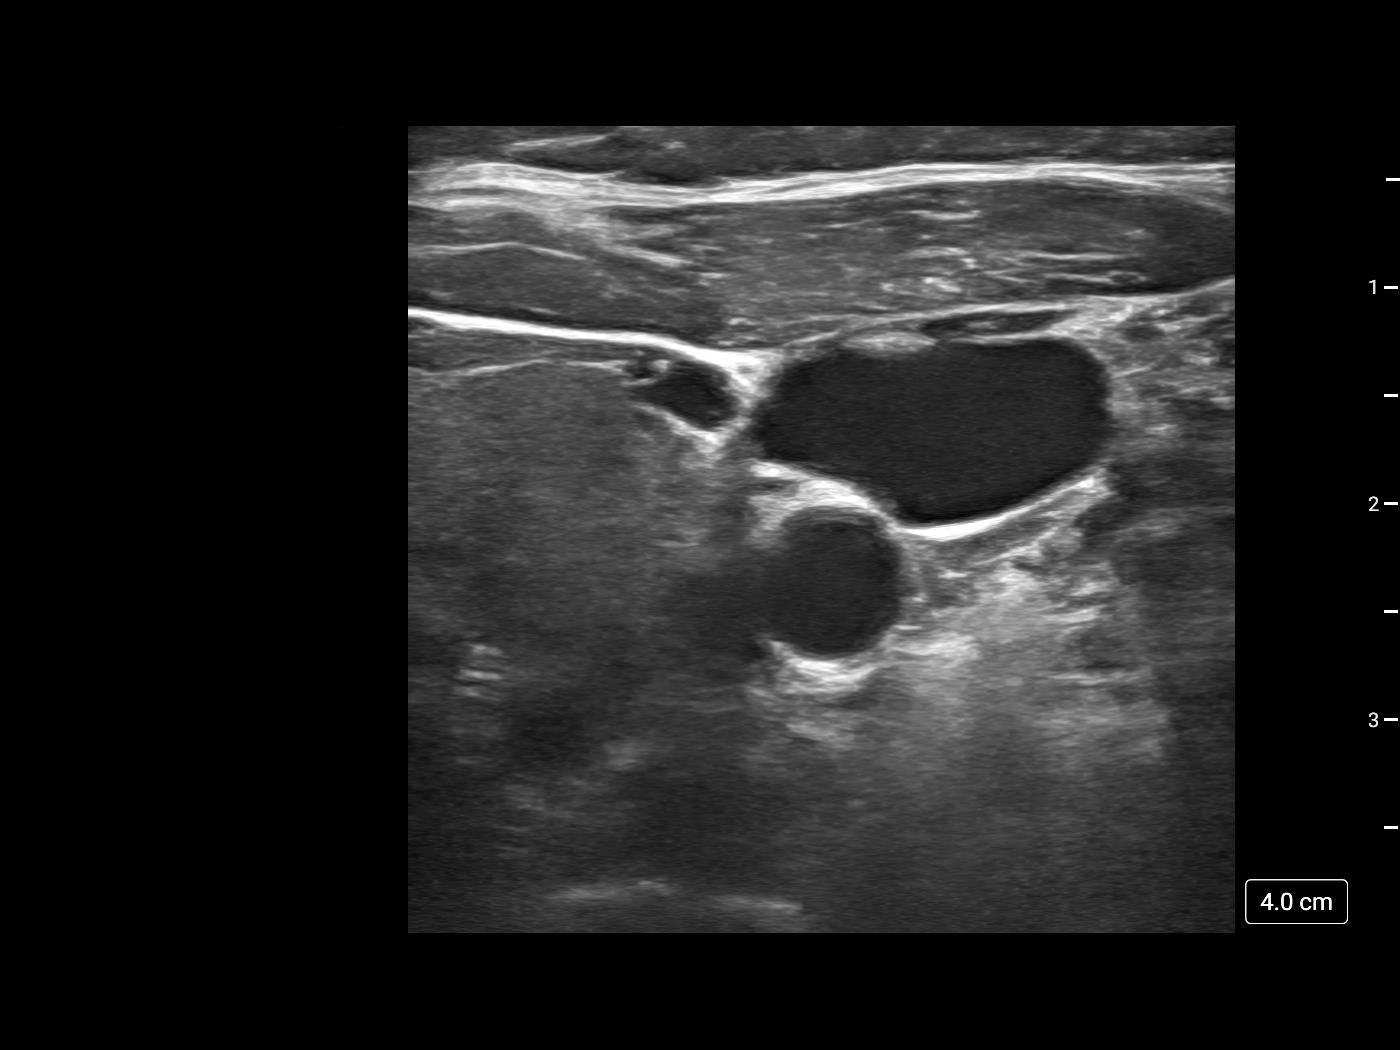
[im 2/2]
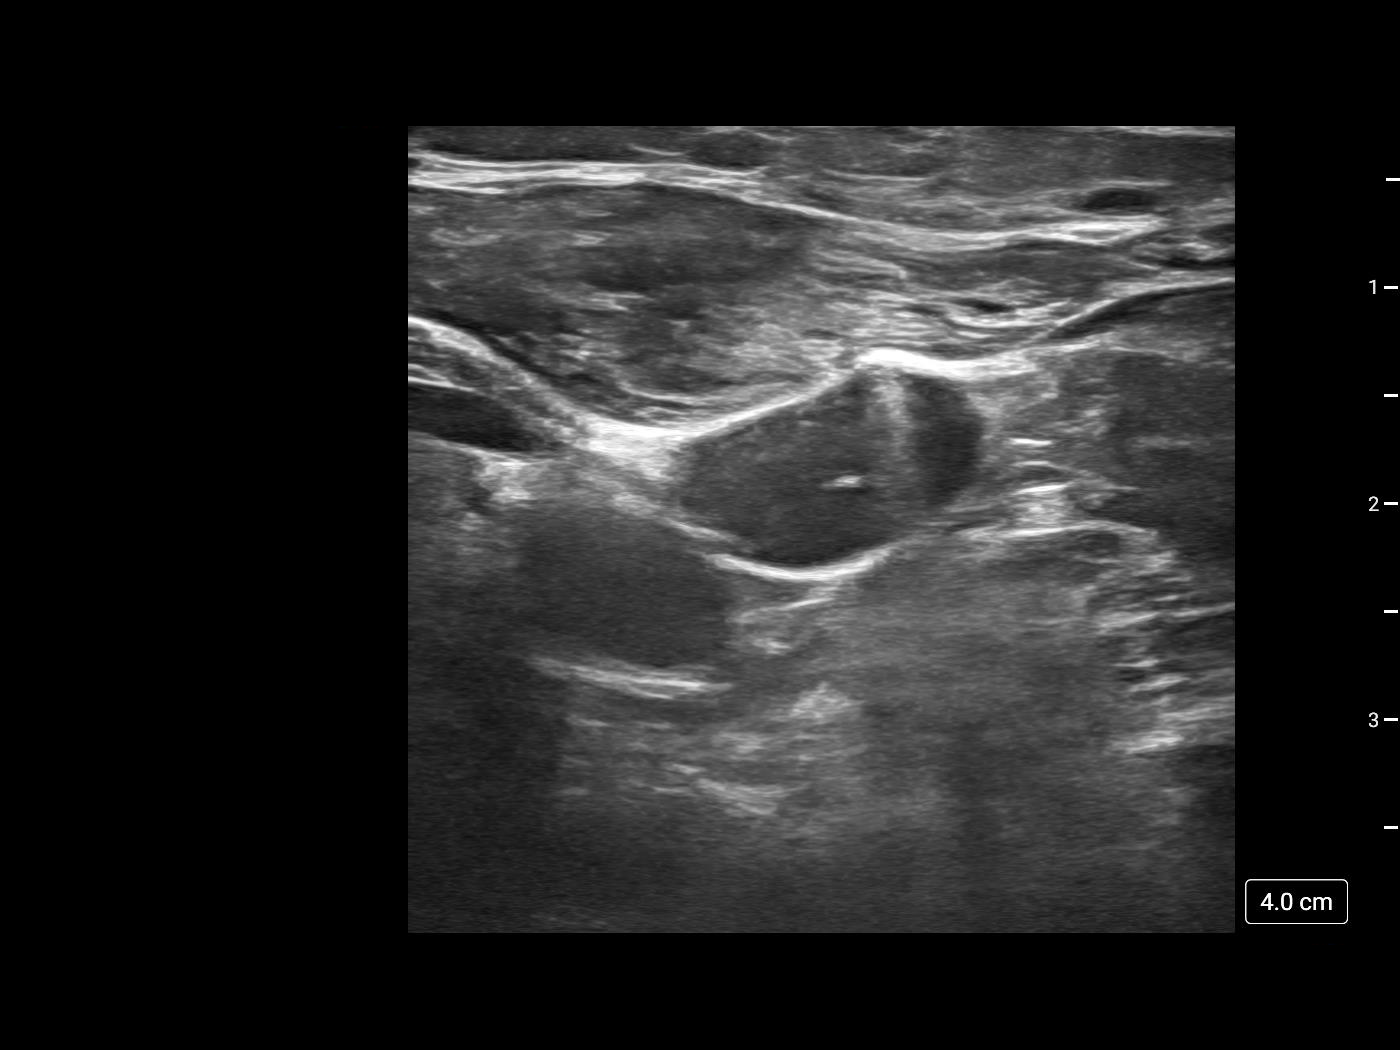

[aw electronic film · 1 of 1 slices shown]
[im 1/1]
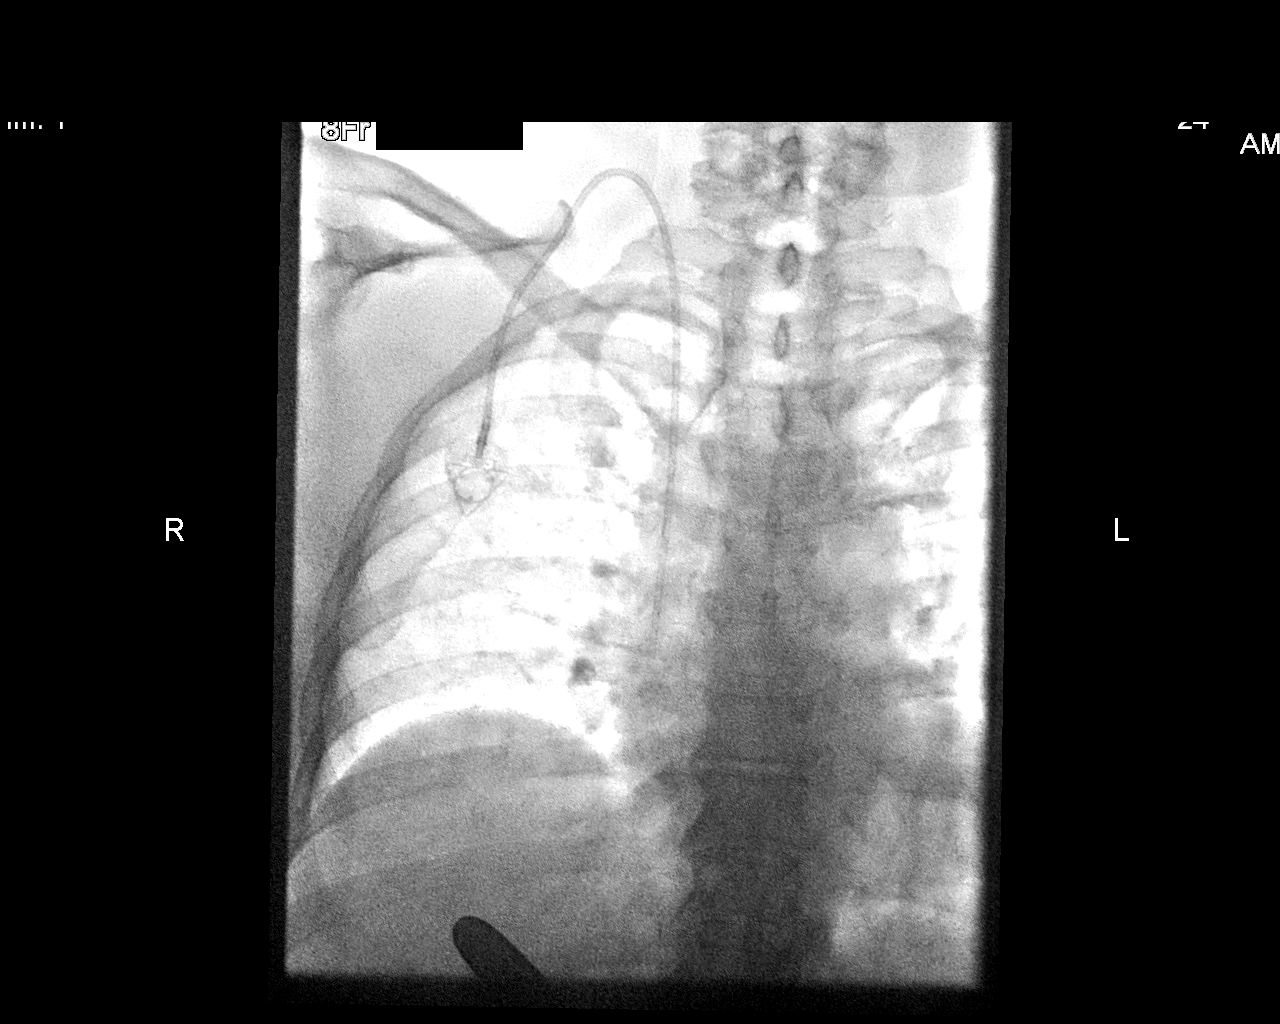

[3 of 3 positions shown; findings below may reference images not displayed]

EXAM:
IR IMAGING GUIDED PORT INSERTION

Date: 04/08/2022

ANESTHESIA/SEDATION:
Moderate (conscious) sedation was administered during this
procedure. A total of 3.5 mg Versed and 150 mg Fentanyl were
administered intravenously. The patient's vital signs were monitored
continuously by radiology nursing throughout the course of the
procedure.

Total sedation time: 28 minutes

FLUOROSCOPY:
12 seconds of fluoro time: 1 mGy
Ultrasound demonstrated patency of the right internal jugular vein,
and this was documented with an image. Under real-time ultrasound
guidance, this vein was accessed with a 21 gauge micropuncture
needle and image documentation was performed. A small dermatotomy
was made at the access site with an 11 scalpel. A 0.018" wire was
advanced into the SVC and the access needle exchanged for a 4F
micropuncture vascular sheath. The 0.018" wire was then removed and
a 0.035" wire advanced into the IVC.





The pocket was then closed using 4-0 absorbable sutures. The
epidermis was then sealed with Dermabond. The dermatotomy at the
venous access site was also closed with a single inverted subdermal
suture and the epidermis sealed with Dermabond.

COMPLICATIONS:
None.  The patient tolerated the procedure well.
IMPRESSION: Successful placement of a right IJ approach Power Port with
ultrasound and fluoroscopic guidance. The catheter is ready for use.

## 2023-05-04 ENCOUNTER — Ambulatory Visit: Payer: Managed Care, Other (non HMO) | Attending: Cardiology | Admitting: Internal Medicine

## 2023-05-04 ENCOUNTER — Encounter: Payer: Self-pay | Admitting: Internal Medicine

## 2023-05-04 VITALS — BP 120/68 | HR 96 | Ht 67.0 in | Wt 177.5 lb

## 2023-05-04 DIAGNOSIS — I5033 Acute on chronic diastolic (congestive) heart failure: Secondary | ICD-10-CM

## 2023-05-04 DIAGNOSIS — R0789 Other chest pain: Secondary | ICD-10-CM

## 2023-05-04 MED ORDER — METOPROLOL TARTRATE 25 MG PO TABS: 25.0000 mg | ORAL_TABLET | Freq: Two times a day (BID) | ORAL | 1 refills | Status: DC

## 2023-05-04 NOTE — Patient Instructions (Signed)
Medication Instructions:  Your physician has recommended you make the following change in your medication:  Increase Metoprolol tartrate 25 mg twice a day  Labwork: None  Testing/Procedures: None  Follow-Up: Your physician recommends that you schedule a follow-up appointment in: 6 months  Any Other Special Instructions Will Be Listed Below (If Applicable).  If you need a refill on your cardiac medications before your next appointment, please call your pharmacy.

## 2023-05-06 NOTE — Progress Notes (Signed)
Cardiology Office Note  Date: 05/06/2023   ID: Leah Diaz, DOB 1961-06-12, MRN [id number]  PCP:  Juliette Alcide, MD  Cardiologist:  Marjo Bicker, MD Electrophysiologist:  None   Reason for Office Visit: Sinus tachycardia evaluation the request of Dr Bertis Ruddy   History of Present Illness: Leah Diaz is a 62 y.o. female known to have HTN, asthma was referred to cardiology clinic for evaluation of sinus tachycardia.  Echo from 5/24 showed normal LVEF, G1 DD, normal RV function and no valvular heart disease. Patient was complaining of substernal chest tightness with exertion lasting for a few minutes and ongoing for the last few months. Has some SOB but no orthopnea/PND or leg swelling.  Has palpitation symptoms.  No syncope.  No leg swelling.  EKG from 6/24 showed NSR, HR 96 bpm.  Past Medical History:  Diagnosis Date   Anemia    Arthritis    Asthma    remote history as a teenager   GERD (gastroesophageal reflux disease)    Hypertension    Menopausal symptoms 05/05/2011   Migraines 05/05/2011   Seasonal allergies    Uterine sarcoma (HCC)     Past Surgical History:  Procedure Laterality Date   ABDOMINAL HYSTERECTOMY     BREAST BIOPSY     x 2 benign   COLONOSCOPY N/A 09/18/2021   Procedure: COLONOSCOPY;  Surgeon: Malissa Hippo, MD;  Location: [facility];  Service: Endoscopy;  Laterality: N/A;  8:05   HYSTEROSCOPY  11/06/2020   IR IMAGING GUIDED PORT INSERTION  04/08/2022   ROBOTIC ASSISTED TOTAL HYSTERECTOMY WITH BILATERAL SALPINGO OOPHERECTOMY N/A 06/03/2021   Procedure: XI ROBOTIC ASSISTED TOTAL HYSTERECTOMY FOR UTERUS GREATER THAN 250G WITH BILATERAL SALPINGO OOPHORECTOMY;  Surgeon: Adolphus Birchwood, MD;  Location: [facility];  Service: Gynecology;  Laterality: N/A;   TUBAL LIGATION  05/05/2011    Current Outpatient Medications  Medication Sig Dispense Refill   acetaminophen (TYLENOL) 500 MG tablet Take 500 mg by mouth every 6 (six) hours as needed for  mild pain, moderate pain or fever.     aspirin 81 MG EC tablet Take by mouth daily.     Cholecalciferol (VITAMIN D3 PO) Take 1 tablet by mouth daily.     exemestane (AROMASIN) 25 MG tablet TAKE 1 TABLET (25 MG TOTAL) BY MOUTH DAILY AFTER BREAKFAST. 90 tablet 1   furosemide (LASIX) 20 MG tablet Take 1 tablet (20 mg total) by mouth daily. 30 tablet 3   lidocaine-prilocaine (EMLA) cream Apply 1 Application topically as needed. 30 g 3   loratadine (CLARITIN) 10 MG tablet Take 10 mg by mouth daily.     LORazepam (ATIVAN) 0.5 MG tablet Take 1 tablet (0.5 mg total) by mouth 2 (two) times daily as needed for anxiety. 10 tablet 0   losartan (COZAAR) 50 MG tablet Take 1 tablet (50 mg total) by mouth daily. 30 tablet 1   metoprolol tartrate (LOPRESSOR) 25 MG tablet Take 1 tablet (25 mg total) by mouth 2 (two) times daily. 180 tablet 1   Multiple Vitamin (MULTIVITAMIN WITH MINERALS) TABS tablet Take 1 tablet by mouth daily. 30 tablet 1   ondansetron (ZOFRAN) 8 MG tablet Take 1 tablet (8 mg total) by mouth every 8 (eight) hours as needed for nausea. 30 tablet 3   omeprazole (PRILOSEC) 20 MG capsule Take 1 capsule (20 mg total) by mouth 2 (two) times daily. 28 capsule 0   No current facility-administered medications for this visit.   Allergies:  Patient has no known allergies.   Social History: The patient  reports that she has never smoked. She has never used smokeless tobacco. She reports that she does not drink alcohol and does not use drugs.   Family History: The patient's family history includes Colon cancer in her maternal uncle; Pancreatic cancer in her maternal grandmother.   ROS:  Please see the history of present illness. Otherwise, complete review of systems is positive for none  All other systems are reviewed and negative.   Physical Exam: VS:  BP 120/68   Pulse 96   Ht 5\' 7"  (1.702 m)   Wt 177 lb 8 oz (80.5 kg)   SpO2 94%   BMI 27.80 kg/m , BMI Body mass index is 27.8 kg/m.  Wt  Readings from Last 3 Encounters:  05/04/23 177 lb 8 oz (80.5 kg)  04/23/23 173 lb 12.8 oz (78.8 kg)  03/26/23 172 lb 9.9 oz (78.3 kg)    General: Patient appears comfortable at rest. HEENT: Conjunctiva and lids normal, oropharynx clear with moist mucosa. Neck: Supple, no elevated JVP or carotid bruits, no thyromegaly. Lungs: Clear to auscultation, nonlabored breathing at rest. Cardiac: Regular rate and rhythm, no S3 or significant systolic murmur, no pericardial rub. Abdomen: Soft, nontender, no hepatomegaly, bowel sounds present, no guarding or rebound. Extremities: No pitting edema, distal pulses 2+. Skin: Warm and dry. Musculoskeletal: No kyphosis. Neuropsychiatric: Alert and oriented x3, affect grossly appropriate.  Recent Labwork: 03/23/2023: B Natriuretic Peptide 95.0 03/24/2023: Magnesium 2.2; TSH 4.757 04/23/2023: ALT 10; AST 17; BUN 21; Creatinine, Ser 0.63; Hemoglobin 11.5; Platelets 191; Potassium 3.8; Sodium 136  No results found for: "CHOL", "TRIG", "HDL", "CHOLHDL", "VLDL", "LDLCALC", "LDLDIRECT"   Assessment and Plan:  Patient is a 62 year old F known to have HTN, asthma was referred to cardiology clinic for evaluation of sinus tachycardia.  # Chest tightness -EKG showed NSR, HR 96 bpm. Increase metoprolol tartrate from 12.5 mg to 25 mg twice daily.  Echocardiogram from 5/24 showed normal LVEF and no valvular heart disease.  Will reevaluate her symptoms in 3 months and perform stress testing if patient agreeable.  # Chronic diastolic heart failure -Patient has some SOB and symptoms might be improved with p.o. Lasix 20 mg once daily.  Will continue Lasix.  # HTN, controlled -Continue losartan 50 mg once daily, p.o. Lasix 20 mg once daily   I have spent a total of 45 minutes with patient reviewing chart, EKGs, labs and examining patient as well as establishing an assessment and plan that was discussed with the patient.  > 50% of time was spent in direct patient care.     Medication Adjustments/Labs and Tests Ordered: Current medicines are reviewed at length with the patient today.  Concerns regarding medicines are outlined above.   Tests Ordered: Orders Placed This Encounter  Procedures   EKG 12-Lead    Medication Changes: Meds ordered this encounter  Medications   metoprolol tartrate (LOPRESSOR) 25 MG tablet    Sig: Take 1 tablet (25 mg total) by mouth 2 (two) times daily.    Dispense:  180 tablet    Refill:  1    05/04/2023-Dose increase    Disposition:  Follow up  3 months  Signed Damonie Furney Verne Spurr, MD, 05/06/2023 12:48 PM    [facility] [address] [place], [place], [place] [address]

## 2023-05-11 DIAGNOSIS — R0789 Other chest pain: Secondary | ICD-10-CM | POA: Insufficient documentation

## 2023-06-03 ENCOUNTER — Encounter: Payer: Self-pay | Admitting: Hematology and Oncology

## 2023-06-16 ENCOUNTER — Ambulatory Visit (HOSPITAL_BASED_OUTPATIENT_CLINIC_OR_DEPARTMENT_OTHER): Payer: Managed Care, Other (non HMO) | Admitting: Cardiology

## 2023-06-18 ENCOUNTER — Inpatient Hospital Stay: Payer: Managed Care, Other (non HMO) | Attending: Gynecologic Oncology

## 2023-06-18 ENCOUNTER — Ambulatory Visit (HOSPITAL_COMMUNITY)
Admission: RE | Admit: 2023-06-18 | Discharge: 2023-06-18 | Disposition: A | Discharge status: Home / Self Care | Payer: Managed Care, Other (non HMO) | Source: Ambulatory Visit | Attending: Hematology and Oncology | Admitting: Hematology and Oncology

## 2023-06-18 ENCOUNTER — Other Ambulatory Visit: Payer: Self-pay

## 2023-06-18 DIAGNOSIS — C549 Malignant neoplasm of corpus uteri, unspecified: Secondary | ICD-10-CM | POA: Diagnosis present

## 2023-06-18 DIAGNOSIS — M8580 Other specified disorders of bone density and structure, unspecified site: Secondary | ICD-10-CM

## 2023-06-18 DIAGNOSIS — C55 Malignant neoplasm of uterus, part unspecified: Secondary | ICD-10-CM | POA: Insufficient documentation

## 2023-06-18 DIAGNOSIS — C7801 Secondary malignant neoplasm of right lung: Secondary | ICD-10-CM | POA: Insufficient documentation

## 2023-06-18 DIAGNOSIS — C786 Secondary malignant neoplasm of retroperitoneum and peritoneum: Secondary | ICD-10-CM | POA: Insufficient documentation

## 2023-06-18 DIAGNOSIS — R600 Localized edema: Secondary | ICD-10-CM | POA: Insufficient documentation

## 2023-06-18 DIAGNOSIS — C7802 Secondary malignant neoplasm of left lung: Secondary | ICD-10-CM | POA: Insufficient documentation

## 2023-06-18 DIAGNOSIS — Z9221 Personal history of antineoplastic chemotherapy: Secondary | ICD-10-CM | POA: Insufficient documentation

## 2023-06-18 LAB — CBC WITH DIFFERENTIAL/PLATELET
Abs Immature Granulocytes: 0.02 10*3/uL (ref 0.00–0.07)
Basophils Absolute: 0 10*3/uL (ref 0.0–0.1)
Basophils Relative: 1 %
Eosinophils Absolute: 0.7 10*3/uL — ABNORMAL HIGH (ref 0.0–0.5)
Eosinophils Relative: 13 %
HCT: 36.8 % (ref 36.0–46.0)
Hemoglobin: 12.4 g/dL (ref 12.0–15.0)
Immature Granulocytes: 0 %
Lymphocytes Relative: 30 %
Lymphs Abs: 1.7 10*3/uL (ref 0.7–4.0)
MCH: 28.2 pg (ref 26.0–34.0)
MCHC: 33.7 g/dL (ref 30.0–36.0)
MCV: 83.6 fL (ref 80.0–100.0)
Monocytes Absolute: 0.7 10*3/uL (ref 0.1–1.0)
Monocytes Relative: 12 %
Neutro Abs: 2.5 10*3/uL (ref 1.7–7.7)
Neutrophils Relative %: 44 %
Platelets: 232 10*3/uL (ref 150–400)
RBC: 4.4 MIL/uL (ref 3.87–5.11)
RDW: 17.1 % — ABNORMAL HIGH (ref 11.5–15.5)
WBC: 5.7 10*3/uL (ref 4.0–10.5)
nRBC: 0 % (ref 0.0–0.2)

## 2023-06-18 LAB — COMPREHENSIVE METABOLIC PANEL
ALT: 12 U/L (ref 0–44)
AST: 16 U/L (ref 15–41)
Albumin: 3.6 g/dL (ref 3.5–5.0)
Alkaline Phosphatase: 99 U/L (ref 38–126)
Anion gap: 5 (ref 5–15)
BUN: 21 mg/dL (ref 8–23)
CO2: 31 mmol/L (ref 22–32)
Calcium: 9.5 mg/dL (ref 8.9–10.3)
Chloride: 103 mmol/L (ref 98–111)
Creatinine, Ser: 0.59 mg/dL (ref 0.44–1.00)
GFR, Estimated: >60 mL/min (ref >60)
Glucose, Bld: 102 mg/dL — ABNORMAL HIGH (ref 70–99)
Potassium: 4.1 mmol/L (ref 3.5–5.1)
Sodium: 139 mmol/L (ref 135–145)
Total Bilirubin: 0.2 mg/dL — ABNORMAL LOW (ref 0.3–1.2)
Total Protein: 6.6 g/dL (ref 6.5–8.1)

## 2023-06-18 MED ORDER — HEPARIN SOD (PORK) LOCK FLUSH 100 UNIT/ML IV SOLN
INTRAVENOUS | Status: AC
  Filled 2023-06-18: qty 5

## 2023-06-18 MED ORDER — SODIUM CHLORIDE 0.9% FLUSH
10.0000 mL | Freq: Once | INTRAVENOUS | Status: AC
  Administered 2023-06-18: 10 mL

## 2023-06-18 MED ORDER — SODIUM CHLORIDE (PF) 0.9 % IJ SOLN
INTRAMUSCULAR | Status: AC
  Filled 2023-06-18: qty 50

## 2023-06-18 MED ORDER — HEPARIN SOD (PORK) LOCK FLUSH 100 UNIT/ML IV SOLN
500.0000 [IU] | Freq: Once | INTRAVENOUS | Status: AC
  Administered 2023-06-18: 500 [IU] via INTRAVENOUS

## 2023-06-18 MED ORDER — IOHEXOL 300 MG/ML  SOLN
100.0000 mL | Freq: Once | INTRAMUSCULAR | Status: AC | PRN
  Administered 2023-06-18: 100 mL via INTRAVENOUS

## 2023-06-24 ENCOUNTER — Encounter: Payer: Self-pay | Admitting: Oncology

## 2023-06-24 NOTE — Progress Notes (Signed)
Faxed order requisition to [facility].

## 2023-06-25 ENCOUNTER — Inpatient Hospital Stay (HOSPITAL_BASED_OUTPATIENT_CLINIC_OR_DEPARTMENT_OTHER): Payer: Managed Care, Other (non HMO) | Admitting: Hematology and Oncology

## 2023-06-25 ENCOUNTER — Encounter: Payer: Self-pay | Admitting: Hematology and Oncology

## 2023-06-25 ENCOUNTER — Other Ambulatory Visit: Payer: Self-pay

## 2023-06-25 VITALS — BP 120/69 | HR 98 | Temp 98.7°F | Resp 18 | Ht 67.0 in | Wt 178.8 lb

## 2023-06-25 DIAGNOSIS — C7802 Secondary malignant neoplasm of left lung: Secondary | ICD-10-CM | POA: Diagnosis not present

## 2023-06-25 DIAGNOSIS — C549 Malignant neoplasm of corpus uteri, unspecified: Secondary | ICD-10-CM

## 2023-06-25 DIAGNOSIS — C55 Malignant neoplasm of uterus, part unspecified: Secondary | ICD-10-CM | POA: Insufficient documentation

## 2023-06-25 DIAGNOSIS — R600 Localized edema: Secondary | ICD-10-CM | POA: Diagnosis not present

## 2023-06-25 DIAGNOSIS — C786 Secondary malignant neoplasm of retroperitoneum and peritoneum: Secondary | ICD-10-CM | POA: Diagnosis not present

## 2023-06-25 DIAGNOSIS — C7801 Secondary malignant neoplasm of right lung: Secondary | ICD-10-CM | POA: Diagnosis not present

## 2023-06-25 DIAGNOSIS — Z9221 Personal history of antineoplastic chemotherapy: Secondary | ICD-10-CM | POA: Diagnosis not present

## 2023-06-25 MED ORDER — LOSARTAN POTASSIUM 50 MG PO TABS: 50.0000 mg | ORAL_TABLET | Freq: Every day | ORAL | 1 refills | Status: DC

## 2023-06-25 MED ORDER — FUROSEMIDE 20 MG PO TABS: 20.0000 mg | ORAL_TABLET | Freq: Every day | ORAL | 3 refills | Status: DC

## 2023-06-25 MED ORDER — METOPROLOL TARTRATE 25 MG PO TABS: 25.0000 mg | ORAL_TABLET | Freq: Two times a day (BID) | ORAL | 1 refills | Status: DC

## 2023-06-25 NOTE — Assessment & Plan Note (Signed)
Bilateral lower extremity edema has resolved She is taking intermittent doses of Lasix I refilled her prescription today

## 2023-06-25 NOTE — Assessment & Plan Note (Signed)
I spent a lot of time reviewing imaging studies with the patient and her husband She has signs of disease progression but so far she is not symptomatic The size of peritoneal lesions are not doubling  We discussed the risk and benefits of switching out her current treatment Option 1 would include tamoxifen with Lupron.  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

## 2023-06-25 NOTE — Progress Notes (Signed)
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  We discussed risk and benefits of this option Option 2 would be resumption of previous chemotherapy Option 3 would be to consider immunotherapy, given her PD-L1 test is positive  I will also get her previous tissue sent for molecular testing to see if there is any other targets that we can use Ultimately, she is undecided She will discuss this with her family this weekend and call me back on Monday for final decision  Bilateral leg edema Bilateral lower extremity edema has resolved She is taking intermittent doses of Lasix I refilled her prescription today  No orders of the defined types were placed in this encounter.   All questions were answered. The patient knows to call the clinic with any problems, questions or concerns. The total time spent in the appointment was 40 minutes encounter with patients including review of chart and various tests results, discussions about plan of care and coordination of care plan   Artis Delay, MD 06/25/2023 11:08 AM  INTERVAL HISTORY: Please see below for problem oriented charting. 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 We spent a lot of time reviewing test results and discussed treatment options  REVIEW OF SYSTEMS:   Constitutional: Denies fevers, chills or abnormal weight loss Eyes: Denies blurriness of vision Ears, nose, mouth, throat, and face: Denies mucositis or sore throat Respiratory: Denies cough, dyspnea or wheezes Cardiovascular: Denies palpitation, chest discomfort or lower extremity swelling Gastrointestinal:  Denies nausea, heartburn or change in bowel habits Skin: Denies abnormal skin rashes Lymphatics: Denies new lymphadenopathy or easy bruising Neurological:Denies numbness, tingling or new weaknesses Behavioral/Psych: Mood is stable, no new changes  All other systems were reviewed with the patient and are negative.  I have reviewed the past medical history, past surgical history, social history and family history with the patient and they are unchanged from previous note.  ALLERGIES:  has No Known Allergies.  MEDICATIONS:  Current Outpatient Medications  Medication Sig Dispense Refill   acetaminophen (TYLENOL) 500 MG tablet Take 500 mg by mouth every 6 (six) hours as needed for mild pain, moderate pain or fever.     aspirin 81 MG EC tablet Take by mouth daily.     Cholecalciferol (VITAMIN D3 PO) Take 1 tablet by mouth daily.     exemestane (AROMASIN) 25 MG tablet TAKE 1 TABLET (25 MG TOTAL) BY MOUTH DAILY AFTER BREAKFAST. 90 tablet 1   furosemide (LASIX) 20 MG tablet Take 1 tablet (20 mg total) by mouth daily. 30 tablet 3   lidocaine-prilocaine (EMLA) cream Apply 1 Application topically as needed. 30 g 3  loratadine (CLARITIN) 10 MG tablet Take 10 mg by mouth daily.     LORazepam (ATIVAN) 0.5 MG tablet Take 1 tablet (0.5 mg total) by mouth 2 (two) times daily as needed for anxiety. 10 tablet 0   losartan (COZAAR) 50 MG tablet Take 1 tablet (50 mg total) by mouth daily. 30 tablet 1    metoprolol tartrate (LOPRESSOR) 25 MG tablet Take 1 tablet (25 mg total) by mouth 2 (two) times daily. 180 tablet 1   Multiple Vitamin (MULTIVITAMIN WITH MINERALS) TABS tablet Take 1 tablet by mouth daily. 30 tablet 1   omeprazole (PRILOSEC) 20 MG capsule Take 1 capsule (20 mg total) by mouth 2 (two) times daily. 28 capsule 0   ondansetron (ZOFRAN) 8 MG tablet Take 1 tablet (8 mg total) by mouth every 8 (eight) hours as needed for nausea. 30 tablet 3   No current facility-administered medications for this visit.    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 No extra uterine extension. No evidence of metastatic disease within the pelvis.   Mild thickening of the endometrial stripe, likely related to entrapment by the adjacent uterine mass   06/03/2021 Initial Diagnosis   Uterine sarcoma (HCC)   06/03/2021 Cancer Staging   Staging form: Corpus Uteri - Sarcoma, AJCC 7th Edition - Clinical stage from 06/03/2021: FIGO Stage IVB (rT1b, N0, M1) - Signed by Artis Delay, MD on 03/31/2022 Diagnostic confirmation: Positive histology Stage prefix: Recurrence Biopsy of metastatic site performed: No Lymph-vascular invasion (LVI): LVI present/identified, NOS   06/03/2021 Pathology Results   FINAL MICROSCOPIC DIAGNOSIS:   A. UTERUS, CERVIX, BILATERAL FALLOPIAN TUBES AND OVARIES, HYSTERECTOMY:  - Uterus:       Mixed high grade uterine sarcoma, spanning 6 cm, see comment.       Extensive lymphovascular invasion.       See oncology table.  - Cervix: Benign squamous and endocervical mucosa. No dysplasia or  malignancy.  -  Bilateral ovaries: Unremarkable. No malignancy.  - Bilateral fallopian tubes: Unremarkable. No malignancy.   ONCOLOGY TABLE:   UTERUS, SARCOMA: Resection   Procedure: Total hysterectomy and bilateral salpingo-oophorectomy  Specimen Integrity: Focally disrupted on posterior surface  Tumor Site: Anterior wall  Tumor Size: 6 cm  Histologic Type: High grade sarcoma, mixed. See comment.  Other Tissue/ Organ Involvement: Not identified  Lymphovascular Invasion: Present, extensive.  Margins: All margins negative for tumor  Regional Lymph Nodes: Not applicable (no lymph nodes submitted or found)  Distant Metastasis:       Distant Site(s) Involved: Not applicable  Pathologic Stage Classification (pTNM, AJCC 8th Edition): pT1b, pN not  assigned  Ancillary Studies: Can be performed upon request  Representative Tumor Block: A6  Comment(s): The tumor has two morphologically different components. One component consists of malignant spindle cells which can be seen arising from the smooth muscle. These foci are positive for SMA, desmin, cyclinD1 (focal), and CD10 (patchy). The other component consists of round to slightly spindle cells with abundant admixed vessels and occasional large pleomorphic cells. These areas are positive for SMA (patchy weak), desmin (focal), CD10 (variable with diffuse areas). Pancytokeratin is negative. 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   ADDENDUM:   PROGNOSTIC INDICATOR RESULTS:   Immunohistochemical and morphometric analysis performed manually    Estrogen Receptor:       POSITIVE, 50%, WEAK TO MODERATE STAINING  Progesterone Receptor:   POSITIVE, 50%, MODERATE TO STRONG STAINING   Reference Range Estrogen and Progesterone Receptor       Negative  0%       Positive  >1%    06/03/2021 Surgery   Surgeon: Quinn Axe    Assistants: Dr Antionette Char (an MD assistant was  necessary for tissue manipulation, management of robotic instrumentation, retraction and positioning due to the complexity of the case and hospital policies).  Operation: Robotic-assisted laparoscopic total hysterectomy >250gm with bilateral salpingoophorectomy, minilaparotomy for specimen delivery   Surgeon: Quinn Axe    Operative Findings:  : Bulky 16cm uterus with intra-uterine mass (consistent with a fibroid-like mass) , smooth normal appearing serosa, no suspicious bulky nodes. Normal ovaries bilaterally. Normal upper abdomen. Uterus too large to deliver vaginally in tact.    06/18/2021 Imaging   1. Multiple bilateral pulmonary nodules, measuring up to 9 mm. Most of these are perifissural and subpleural in location, likely lymph nodes. Nevertheless, close follow-up recommended to exclude metastatic disease. 2. 11 mm hypoattenuating lesion towards the dome of the liver, indeterminate. MRI abdomen with and without contrast could be used to further evaluate as clinically warranted. 3. Aortic Atherosclerosis (ICD10-I70.0).   10/13/2021 Imaging   1. No acute findings within the abdomen or pelvis. No specific findings identified to suggest residual or recurrence of tumor. 2. Stable small pulmonary nodules within the left lower lobe.   03/18/2022 Imaging   1. Interval development of 2 soft tissue nodules in the anterior pelvis measuring 10 mm and 7 mm respectively. Close follow-up recommended to exclude metastatic disease. PET-CT may be warranted to further evaluate. 2. Development of 2 mm left upper lobe parenchymal nodule, nonspecific. Close attention on follow-up imaging recommended  3. Stable appearance of index bilateral pulmonary nodules identified previously. 4. Aortic Atherosclerosis (ICD10-I70.0).   04/02/2022 PET scan   1. Signs of local tumor recurrence noted at the vaginal cuff. 2. 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 New since  10/13/2021 and progressive when compared with 03/17/2022. 3. New focal area of increased uptake within the right biceps femoris muscle which is suspicious for skeletal muscle involvement. 4. Stable appearance of small pulmonary nodules described on 03/17/2022. These are too small to characterize by PET-CT.   04/09/2022 Procedure   Successful placement of a right IJ approach Power Port with ultrasound and fluoroscopic guidance. The catheter is ready for use.   04/09/2022 Echocardiogram    1. Left ventricular ejection fraction, by estimation, is 65 to 70%. The left ventricle has normal function. The left ventricle has no regional wall motion abnormalities. There is mild concentric left ventricular hypertrophy. Left ventricular diastolic parameters are consistent with Grade I diastolic dysfunction (impaired relaxation).  2. Right ventricular systolic function is normal. The right ventricular size is normal.  3. The mitral valve is normal in structure. No evidence of mitral valve regurgitation.  4. The aortic valve is tricuspid. Aortic valve regurgitation is not visualized. No aortic stenosis is present.  5. The inferior vena cava is normal in size with greater than 50% respiratory variability, suggesting right atrial pressure of 3 mmHg.   04/16/2022 - 05/28/2022 Chemotherapy   Patient is on Treatment Plan : UTERINE LEIOMYOSARCOMA Doxorubicin q21d x 6  Cycles     06/10/2022 Imaging   1. Status post hysterectomy and bilateral oophorectomy. Multiple newand enlarged peritoneal nodules throughout the low abdomen and pelvis, previously FDG avid and consistent with worsened locally recurrent and peritoneal metastatic disease. 2. Multiple small bilateral pulmonary nodules, several of which are slightly enlarged compared to prior examination, consistent with worsened pulmonary metastatic disease.   Aortic Atherosclerosis (ICD10-I70.0).     06/25/2022 - 07/06/2022 Chemotherapy   Patient is on Treatment Plan :  UTERINE UNDIFFERENTIATED / LEIOMYOSARCOMA Gemcitabine D1,8 + Docetaxel D8 (900/100) q21d     06/25/2022 - 03/04/2023 Chemotherapy   Patient is on Treatment Plan : UTERINE UNDIFFERENTIATED LEIOMYOSARCOMA Gemcitabine D1,8 + Docetaxel D8 (900/100) q21d     09/02/2022 Imaging   1. Moderate response to therapy of peritoneal metastasis within the anterior pelvis. 2. Minimal response to therapy of pulmonary metastasis. 3. No new or progressive disease. 4.  Aortic Atherosclerosis (ICD10-I70.0).     12/01/2022 Imaging   1. No new or progressive findings in the chest, abdomen, or pelvis. 2. Peritoneal metastases in the pelvis described previously measure smaller today compatible with interval response to therapy. 3. Stable tiny bilateral pulmonary nodules. Previously characterized as metastatic lesions, these are unchanged in the interval. No new suspicious pulmonary nodule or mass. 4.  Aortic Atherosclerois (ICD10-170.0)   03/04/2023 Imaging   CT CHEST ABDOMEN PELVIS W CONTRAST  Result Date: 03/03/2023 CLINICAL DATA:  Cervical cancer * Tracking Code: BO *. Assess treatment response. Current chemotherapy. Prior total hysterectomy EXAM: CT CHEST, ABDOMEN, AND PELVIS WITH CONTRAST TECHNIQUE: Multidetector CT imaging of the chest, abdomen and pelvis was performed following the standard protocol during bolus administration of intravenous contrast. RADIATION DOSE REDUCTION: This exam was performed according to the departmental dose-optimization program which includes automated exposure control, adjustment of the mA and/or kV according to patient size and/or use of iterative reconstruction technique. CONTRAST:  OMNIPAQUE IOHEXOL 300 MG/ML  SOLN COMPARISON:  CT 12/01/2022 and older FINDINGS: CT CHEST FINDINGS Cardiovascular: Right upper chest port. Heart is nonenlarged. Trace pericardial fluid. Normal caliber thoracic aorta. Mediastinum/Nodes: No specific abnormal lymph node enlargement seen in the axillary  region, hilum or mediastinum. Mildly patulous thoracic esophagus. Thyroid gland is unremarkable. Lungs/Pleura: Tiny pleural effusions are seen, left-greater-than-right. These new from previous. There is some patchy parenchymal opacity identified in the superior segment of the lower lobes, left-greater-than-right. This could be infiltrative rather than neoplastic. Recommend short follow-up. There were some small nodules identified on the prior. For example 3 mm nodule lateral left lower lobe subpleural on series 7, image 91 is stable. Right lower lobe subpleural nodule on the prior which measured 9 x 6 mm, today is stable on image 73 of series 7. Stable 2-3 mm nodule right lower lobe on series 7, image 97. Musculoskeletal: Curvature of the spine with some degenerative changes. CT ABDOMEN PELVIS FINDINGS Hepatobiliary: Stable dome hepatic cyst. No new space-occupying lesion in the liver. Patent portal vein. Gallbladder is distended. Pancreas: Unremarkable. No pancreatic ductal dilatation or surrounding inflammatory changes. Spleen: Spleen is slightly enlarged at 13.1 cm in AP length. Preserved enhancement. Small splenule inferiorly. Adrenals/Urinary Tract: The adrenal glands are preserved. Tiny low-attenuation cystic lesions are seen which are too small to completely characterize along the kidneys. Bosniak 2 lesions. No specific imaging follow-up. The ureters have normal course and caliber down to the bladder. Bladder wall is diffusely thickened. The bladder is contracted. There is significant stranding. This is progressive from the previous examination. Stomach/Bowel: Large  bowel has a normal course and caliber with scattered stool. Overall moderate stool burden. Normal appendix extends inferior to the cecum in the right lower quadrant. The stomach and small bowel are nondilated. Vascular/Lymphatic: Normal caliber aorta and IVC with some vascular calcifications. No specific abnormal lymph node enlargement identified  in the abdomen and pelvis. Reproductive: Absence of the uterus and ovaries. Other: Once again there is a pelvic soft tissue nodule anteriorly on series 2, image 104 which on the prior measured 2.1 x 1.6 cm and today 1.9 x 1.3 cm. Smaller foci more caudal on axial image 111 and 112 are stable. Musculoskeletal: Curvature and degenerative changes are seen along the spine. Degenerative changes along the pelvis. IMPRESSION: Stable pelvic peritoneal nodules. No new lymph node enlargement seen in the chest, abdomen or pelvis. New tiny pleural effusions, left-greater-than-right with some ill-defined parenchymal opacities along the lower lobes. Although there is a differential this could be infectious or inflammatory. Recommend short follow-up and correlation to specific symptoms. Previous tiny lung nodules are stable. Continued follow-up surveillance as per the patient's neoplasm. Mild splenomegaly. Worsening bladder wall thickening and stranding. Please correlate with symptoms and treatment Electronically Signed   By: Karen Kays M.D.   On: 03/03/2023 18:23      04/05/2023 Imaging   Normal bone density scan   06/21/2023 Imaging   CT CHEST ABDOMEN PELVIS W CONTRAST  Result Date: 06/20/2023 CLINICAL DATA:  Endometrial cancer restaging, chemotherapy complete, ongoing oral chemotherapy * Tracking Code: BO * EXAM: CT CHEST, ABDOMEN, AND PELVIS WITH CONTRAST TECHNIQUE: Multidetector CT imaging of the chest, abdomen and pelvis was performed following the standard protocol during bolus administration of intravenous contrast. RADIATION DOSE REDUCTION: This exam was performed according to the departmental dose-optimization program which includes automated exposure control, adjustment of the mA and/or kV according to patient size and/or use of iterative reconstruction technique. CONTRAST:  OMNIPAQUE IOHEXOL 300 MG/ML  SOLN COMPARISON:  CT chest angiogram, 03/23/2023, CT chest abdomen pelvis, 03/02/2023 FINDINGS: CT CHEST  FINDINGS Cardiovascular: No significant vascular findings. Normal heart size. No pericardial effusion. Mediastinum/Nodes: No enlarged mediastinal, hilar, or axillary lymph nodes. Thyroid gland, trachea, and esophagus demonstrate no significant findings. Lungs/Pleura: Interval resolution of previously seen pleural effusions. Significant interval improvement of previously seen heterogeneous and ground-glass airspace opacity, with small residual opacities present in the lower lobes (series 4, image 68). Multiple small subpleural nodules are unchanged, largest in the dependent right lower lobe measuring 0.9 cm (series 4, image 75). Musculoskeletal: No chest wall abnormality. No acute osseous findings. CT ABDOMEN PELVIS FINDINGS Hepatobiliary: No solid liver abnormality is seen. Benign simple cyst of the liver dome, requiring no further follow-up or characterization (series 2, image 45). No gallstones, gallbladder wall thickening, or biliary dilatation. Pancreas: Unremarkable. No pancreatic ductal dilatation or surrounding inflammatory changes. Spleen: Normal in size without significant abnormality. Adrenals/Urinary Tract: Adrenal glands are unremarkable. Simple, benign right renal cortical cysts, for which no further follow-up or characterization is required. Kidneys are otherwise normal, without renal calculi, solid lesion, or hydronephrosis. Bladder is unremarkable. Stomach/Bowel: Stomach is within normal limits. Appendix appears normal. No evidence of bowel wall thickening, distention, or inflammatory changes. Moderate burden of stool throughout the colon and rectum. Vascular/Lymphatic: No significant vascular findings are present. No enlarged abdominal or pelvic lymph nodes. Reproductive: Status post hysterectomy. Other: No abdominal wall hernia or abnormality. No ascites. Largest soft tissue nodule in the low midline abdomen is increased in size, measuring 2.8 x 2.5 cm, previously 2.4  x 2.1 cm (series 2, image  105). Multiple additional soft tissue nodules throughout the pelvis are unchanged, largest in the anterior right pelvis measuring 1.6 x 1.2 cm (series 2, image 112). Additional nodule at the right aspect of the vaginal cuff measures 2.0 x 1.0 cm (series 2, image 112). Musculoskeletal: No acute osseous findings. IMPRESSION: 1. Largest peritoneal soft tissue nodule in the low midline abdomen is increased in size, consistent with worsened peritoneal metastatic disease. Multiple additional soft tissue nodules throughout the pelvis are unchanged. 2. 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 3. Interval resolution of previously seen pleural effusions. 4. Significant interval improvement of previously seen heterogeneous and ground-glass airspace opacity, with small residual opacities present in the lower lobes. Findings are consistent with improved nonspecific infection or inflammation. 5. Status post hysterectomy. Electronically Signed   By: Jearld Lesch M.D.   On: 06/20/2023 20:49        PHYSICAL EXAMINATION: ECOG PERFORMANCE STATUS: 0 - Asymptomatic  Vitals:   06/25/23 0800  BP: 120/69  Pulse: 98  Resp: 18  Temp: 98.7 F (37.1 C)  SpO2: 100%   Filed Weights   06/25/23 0800  Weight: 178 lb 12.8 oz (81.1 kg)    GENERAL:alert, no distress and comfortable NEURO: alert & oriented x 3 with fluent speech, no focal motor/sensory deficits  LABORATORY DATA:  I have reviewed the data as listed    Component Value Date/Time   NA 139 06/18/2023 0747   K 4.1 06/18/2023 0747   CL 103 06/18/2023 0747   CO2 31 06/18/2023 0747   GLUCOSE 102 (H) 06/18/2023 0747   BUN 21 06/18/2023 0747   CREATININE 0.59 06/18/2023 0747   CREATININE 0.85 03/18/2023 0738   CALCIUM 9.5 06/18/2023 0747   PROT 6.6 06/18/2023 0747   ALBUMIN 3.6 06/18/2023 0747   AST 16 06/18/2023 0747   AST 17 03/18/2023 0738   ALT 12 06/18/2023 0747   ALT 9 03/18/2023 0738   ALKPHOS 99 06/18/2023 0747    BILITOT 0.2 (L) 06/18/2023 0747   BILITOT 0.4 03/18/2023 0738   GFRNONAA >60 06/18/2023 0747   GFRNONAA >60 03/18/2023 0738    No results found for: "SPEP", "UPEP"  Lab Results  Component Value Date   WBC 5.7 06/18/2023   NEUTROABS 2.5 06/18/2023   HGB 12.4 06/18/2023   HCT 36.8 06/18/2023   MCV 83.6 06/18/2023   PLT 232 06/18/2023      Chemistry      Component Value Date/Time   NA 139 06/18/2023 0747   K 4.1 06/18/2023 0747   CL 103 06/18/2023 0747   CO2 31 06/18/2023 0747   BUN 21 06/18/2023 0747   CREATININE 0.59 06/18/2023 0747   CREATININE 0.85 03/18/2023 0738      Component Value Date/Time   CALCIUM 9.5 06/18/2023 0747   ALKPHOS 99 06/18/2023 0747   AST 16 06/18/2023 0747   AST 17 03/18/2023 0738   ALT 12 06/18/2023 0747   ALT 9 03/18/2023 0738   BILITOT 0.2 (L) 06/18/2023 0747   BILITOT 0.4 03/18/2023 0738       RADIOGRAPHIC STUDIES: I have reviewed imaging studies with the patient and her husband I have personally reviewed the radiological images as listed and agreed with the findings in the report. CT CHEST ABDOMEN PELVIS W CONTRAST  Result Date: 06/20/2023 CLINICAL DATA:  Endometrial cancer restaging, chemotherapy complete, ongoing oral chemotherapy * Tracking Code: BO * EXAM: CT CHEST, ABDOMEN, AND PELVIS WITH CONTRAST TECHNIQUE:  Multidetector CT imaging of the chest, abdomen and pelvis was performed following the standard protocol during bolus administration of intravenous contrast. RADIATION DOSE REDUCTION: This exam was performed according to the departmental dose-optimization program which includes automated exposure control, adjustment of the mA and/or kV according to patient size and/or use of iterative reconstruction technique. CONTRAST:  OMNIPAQUE IOHEXOL 300 MG/ML  SOLN COMPARISON:  CT chest angiogram, 03/23/2023, CT chest abdomen pelvis, 03/02/2023 FINDINGS: CT CHEST FINDINGS Cardiovascular: No significant vascular findings. Normal heart size.  No pericardial effusion. Mediastinum/Nodes: No enlarged mediastinal, hilar, or axillary lymph nodes. Thyroid gland, trachea, and esophagus demonstrate no significant findings. Lungs/Pleura: Interval resolution of previously seen pleural effusions. Significant interval improvement of previously seen heterogeneous and ground-glass airspace opacity, with small residual opacities present in the lower lobes (series 4, image 68). Multiple small subpleural nodules are unchanged, largest in the dependent right lower lobe measuring 0.9 cm (series 4, image 75). Musculoskeletal: No chest wall abnormality. No acute osseous findings. CT ABDOMEN PELVIS FINDINGS Hepatobiliary: No solid liver abnormality is seen. Benign simple cyst of the liver dome, requiring no further follow-up or characterization (series 2, image 45). No gallstones, gallbladder wall thickening, or biliary dilatation. Pancreas: Unremarkable. No pancreatic ductal dilatation or surrounding inflammatory changes. Spleen: Normal in size without significant abnormality. Adrenals/Urinary Tract: Adrenal glands are unremarkable. Simple, benign right renal cortical cysts, for which no further follow-up or characterization is required. Kidneys are otherwise normal, without renal calculi, solid lesion, or hydronephrosis. Bladder is unremarkable. Stomach/Bowel: Stomach is within normal limits. Appendix appears normal. No evidence of bowel wall thickening, distention, or inflammatory changes. Moderate burden of stool throughout the colon and rectum. Vascular/Lymphatic: No significant vascular findings are present. No enlarged abdominal or pelvic lymph nodes. Reproductive: Status post hysterectomy. Other: No abdominal wall hernia or abnormality. No ascites. Largest soft tissue nodule in the low midline abdomen is increased in size, measuring 2.8 x 2.5 cm, previously 2.4 x 2.1 cm (series 2, image 105). Multiple additional soft tissue nodules throughout the pelvis are unchanged,  largest in the anterior right pelvis measuring 1.6 x 1.2 cm (series 2, image 112). Additional nodule at the right aspect of the vaginal cuff measures 2.0 x 1.0 cm (series 2, image 112). Musculoskeletal: No acute osseous findings. IMPRESSION: 1. Largest peritoneal soft tissue nodule in the low midline abdomen is increased in size, consistent with worsened peritoneal metastatic disease. Multiple additional soft tissue nodules throughout the pelvis are unchanged. 2. 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 3. Interval resolution of previously seen pleural effusions. 4. Significant interval improvement of previously seen heterogeneous and ground-glass airspace opacity, with small residual opacities present in the lower lobes. Findings are consistent with improved nonspecific infection or inflammation. 5. Status post hysterectomy. Electronically Signed   By: Jearld Lesch M.D.   On: 06/20/2023 20:49

## 2023-06-28 ENCOUNTER — Telehealth: Payer: Self-pay | Admitting: Oncology

## 2023-06-28 NOTE — Telephone Encounter (Signed)
Leah Diaz called and said she has decided to continue the antiestrogen pill and the shot (Lupron). She also mentioned that Dr. Bertis Ruddy is going to send in a different kind of antiestrogen pill.

## 2023-06-29 ENCOUNTER — Telehealth: Payer: Self-pay | Admitting: Hematology and Oncology

## 2023-06-29 ENCOUNTER — Encounter: Payer: Self-pay | Admitting: Hematology and Oncology

## 2023-06-29 ENCOUNTER — Other Ambulatory Visit: Payer: Self-pay | Admitting: Hematology and Oncology

## 2023-06-29 MED ORDER — TAMOXIFEN CITRATE 20 MG PO TABS: 20.0000 mg | ORAL_TABLET | Freq: Every day | ORAL | 3 refills | Status: DC

## 2023-06-29 NOTE — Telephone Encounter (Signed)
I placed order and LOS to see her next Tuesday to start inj I canceled aromasin and sent new prescription of tamoxifen to her local pharmacy. She can start today

## 2023-06-29 NOTE — Telephone Encounter (Signed)
Called [name] and advised her of message below from Dr. Bertis Ruddy.  She verbalized understanding and agreement and will pick up the Tamoxifen today.

## 2023-06-29 NOTE — Telephone Encounter (Signed)
Left patient a message in regard to scheduled appointment times/dates

## 2023-07-06 ENCOUNTER — Encounter: Payer: Self-pay | Admitting: Hematology and Oncology

## 2023-07-06 ENCOUNTER — Other Ambulatory Visit: Payer: Managed Care, Other (non HMO)

## 2023-07-06 ENCOUNTER — Inpatient Hospital Stay (HOSPITAL_BASED_OUTPATIENT_CLINIC_OR_DEPARTMENT_OTHER): Payer: Managed Care, Other (non HMO) | Admitting: Hematology and Oncology

## 2023-07-06 ENCOUNTER — Inpatient Hospital Stay: Payer: Managed Care, Other (non HMO)

## 2023-07-06 ENCOUNTER — Other Ambulatory Visit: Payer: Self-pay

## 2023-07-06 VITALS — BP 136/72 | HR 91 | Temp 98.5°F | Resp 18 | Ht 67.0 in | Wt 182.4 lb

## 2023-07-06 VITALS — BP 146/77 | HR 96 | Temp 98.5°F | Resp 18

## 2023-07-06 DIAGNOSIS — C549 Malignant neoplasm of corpus uteri, unspecified: Secondary | ICD-10-CM

## 2023-07-06 DIAGNOSIS — I10 Essential (primary) hypertension: Secondary | ICD-10-CM

## 2023-07-06 DIAGNOSIS — C55 Malignant neoplasm of uterus, part unspecified: Secondary | ICD-10-CM | POA: Diagnosis not present

## 2023-07-06 DIAGNOSIS — Z780 Asymptomatic menopausal state: Secondary | ICD-10-CM

## 2023-07-06 LAB — CBC WITH DIFFERENTIAL/PLATELET
Abs Immature Granulocytes: 0.01 10*3/uL (ref 0.00–0.07)
Basophils Absolute: 0.1 10*3/uL (ref 0.0–0.1)
Basophils Relative: 1 %
Eosinophils Absolute: 0.8 10*3/uL — ABNORMAL HIGH (ref 0.0–0.5)
Eosinophils Relative: 13 %
HCT: 37.8 % (ref 36.0–46.0)
Hemoglobin: 12.8 g/dL (ref 12.0–15.0)
Immature Granulocytes: 0 %
Lymphocytes Relative: 38 %
Lymphs Abs: 2.4 10*3/uL (ref 0.7–4.0)
MCH: 28.4 pg (ref 26.0–34.0)
MCHC: 33.9 g/dL (ref 30.0–36.0)
MCV: 83.8 fL (ref 80.0–100.0)
Monocytes Absolute: 0.6 10*3/uL (ref 0.1–1.0)
Monocytes Relative: 9 %
Neutro Abs: 2.5 10*3/uL (ref 1.7–7.7)
Neutrophils Relative %: 39 %
Platelets: 229 10*3/uL (ref 150–400)
RBC: 4.51 MIL/uL (ref 3.87–5.11)
RDW: 17.8 % — ABNORMAL HIGH (ref 11.5–15.5)
WBC: 6.3 10*3/uL (ref 4.0–10.5)
nRBC: 0 % (ref 0.0–0.2)

## 2023-07-06 LAB — COMPREHENSIVE METABOLIC PANEL
ALT: 13 U/L (ref 0–44)
AST: 16 U/L (ref 15–41)
Albumin: 3.6 g/dL (ref 3.5–5.0)
Alkaline Phosphatase: 96 U/L (ref 38–126)
Anion gap: 6 (ref 5–15)
BUN: 12 mg/dL (ref 8–23)
CO2: 29 mmol/L (ref 22–32)
Calcium: 9.4 mg/dL (ref 8.9–10.3)
Chloride: 104 mmol/L (ref 98–111)
Creatinine, Ser: 0.56 mg/dL (ref 0.44–1.00)
GFR, Estimated: >60 mL/min (ref >60)
Glucose, Bld: 128 mg/dL — ABNORMAL HIGH (ref 70–99)
Potassium: 4 mmol/L (ref 3.5–5.1)
Sodium: 139 mmol/L (ref 135–145)
Total Bilirubin: 0.3 mg/dL (ref 0.3–1.2)
Total Protein: 6.7 g/dL (ref 6.5–8.1)

## 2023-07-06 MED ORDER — SODIUM CHLORIDE 0.9% FLUSH
10.0000 mL | Freq: Once | INTRAVENOUS | Status: AC
  Administered 2023-07-06: 10 mL

## 2023-07-06 MED ORDER — LEUPROLIDE ACETATE 3.75 MG IM KIT
3.7500 mg | PACK | INTRAMUSCULAR | Status: DC
  Administered 2023-07-06: 3.75 mg via INTRAMUSCULAR
  Filled 2023-07-06: qty 3.75

## 2023-07-06 MED ORDER — HEPARIN SOD (PORK) LOCK FLUSH 100 UNIT/ML IV SOLN
500.0000 [IU] | Freq: Once | INTRAVENOUS | Status: AC
  Administered 2023-07-06: 500 [IU]

## 2023-07-06 MED ORDER — LOSARTAN POTASSIUM 50 MG PO TABS: 50.0000 mg | ORAL_TABLET | Freq: Every day | ORAL | 1 refills | Status: DC

## 2023-07-06 NOTE — Patient Instructions (Signed)
Leuprolide Solution for Injection What is this medication? LEUPROLIDE (loo PROE lide) reduces the symptoms of prostate cancer. It works by decreasing levels of the hormone testosterone in the body. This prevents prostate cancer cells from spreading or growing. This medicine may be used for other purposes; ask your health care provider or pharmacist if you have questions. COMMON BRAND NAME(S): Lupron What should I tell my care team before I take this medication? They need to know if you have any of these conditions: Diabetes Heart attack Heart disease High blood pressure High cholesterol Pain or difficulty passing urine Spinal cord metastasis Stroke Tobacco use An unusual or allergic reaction to leuprolide, other medications, foods, dyes, or preservatives Pregnant or trying to get pregnant Breast-feeding How should I use this medication? This medication is for injection under the skin or into a muscle. You will be taught how to prepare and give this medication. Use exactly as directed. Take your medication at regular intervals. Do not take it more often than directed. It is important that you put your used needles and syringes in a special sharps container. Do not put them in a trash can. If you do not have a sharps container, call your care team to get one. A special MedGuide will be given to you by the pharmacist with each prescription and refill. Be sure to read this information carefully each time. Talk to your care team about the use of this medication in children. While this medication may be prescribed for children as young as 8 years for selected conditions, precautions do apply. Overdosage: If you think you have taken too much of this medicine contact a poison control center or emergency room at once. NOTE: This medicine is only for you. Do not share this medicine with others. What if I miss a dose? If you miss a dose, take it as soon as you can. If it is almost time for your next  dose, take only that dose. Do not take double or extra doses. What may interact with this medication? Do not take this medication with any of the following: Chasteberry Cisapride Dronedarone Pimozide Thioridazine This medication may also interact with the following: Estrogen or progestin hormones Herbal or dietary supplements, like black cohosh or DHEA Other medications that cause heart rhythm changes Testosterone This list may not describe all possible interactions. Give your health care provider a list of all the medicines, herbs, non-prescription drugs, or dietary supplements you use. Also tell them if you smoke, drink alcohol, or use illegal drugs. Some items may interact with your medicine. What should I watch for while using this medication? Visit your care team for regular checks on your progress. During the first week, your symptoms may get worse, but then will improve as you continue your treatment. You may get hot flashes, increased bone pain, increased difficulty passing urine, or an aggravation of nerve symptoms. Discuss these effects with your care team, some of them may improve with continued use of this medication. Patients may experience a menstrual cycle or spotting during the first 2 months of therapy with this medication. If this continues, contact your care team. This medication may increase blood sugar. The risk may be higher in patients who already have diabetes. Ask your care team what you can do to lower your risk of diabetes while taking this medication. What side effects may I notice from receiving this medication? Side effects that you should report to your care team as soon as possible: Allergic reactions--skin rash,  itching, hives, swelling of the face, lips, tongue, or throat Heart attack--pain or tightness in the chest, shoulders, arms, or jaw, nausea, shortness of breath, cold or clammy skin, feeling faint or lightheaded Heart rhythm changes--fast or irregular  heartbeat, dizziness, feeling faint or lightheaded, chest pain, trouble breathing High blood sugar (hyperglycemia)--increased thirst or amount of urine, unusual weakness or fatigue, blurry vision New or worsening seizures Redness, blistering, peeling, or loosening of the skin, including inside the mouth Stroke--sudden numbness or weakness of the face, arm, or leg, trouble speaking, confusion, trouble walking, loss of balance or coordination, dizziness, severe headache, change in vision Swelling and pain of the tumor site or lymph nodes Side effects that usually do not require medical attention (report these to your care team if they continue or are bothersome): Change in sex drive or performance Hot flashes Joint pain Pain, redness, or irritation at injection site Swelling of the ankles, hands, or feet Unusual weakness or fatigue This list may not describe all possible side effects. Call your doctor for medical advice about side effects. You may report side effects to FDA at 1-800-FDA-1088. Where should I keep my medication? Keep out of the reach of children and pets. Store below 25 degrees C (77 degrees F). Do not freeze. Protect from light. Get rid of any unused medication after the expiration date. To get rid of medications that are no longer needed or have expired: Take the medication to a medication take-back program. Check with your pharmacy or law enforcement to find a location. If you cannot return the medication, ask your pharmacist or care team how to get rid of this medication safely. NOTE: This sheet is a summary. It may not cover all possible information. If you have questions about this medicine, talk to your doctor, pharmacist, or health care provider.  2024 [employer] Standard (2023-03-23 00:00:00)

## 2023-07-06 NOTE — Assessment & Plan Note (Signed)
Blood pressure is well-controlled I refilled her prescription today

## 2023-07-06 NOTE — Progress Notes (Signed)
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[facility]) We have reviewed plan of care Recently, we reviewed imaging study results and discussed the risk, benefits, side effects of resumption of chemotherapy versus additional antiestrogen treatment Ultimately, the patient to use for additional antiestrogen therapy She is now switched to Lupron in combination with tamoxifen We discussed some of the side effects to be expected including bone aches, hot flashes, mood swing, depression and risk of DVT I recommend minimum 3 doses of Lupron injections before we repeat imaging study again end of October and she is in agreement  Essential hypertension Blood pressure is well-controlled I refilled her prescription today  No orders of the defined types were placed in this encounter.   All questions were answered. The patient knows to call the clinic with any problems, questions or concerns. The total time spent in the appointment was 30 minutes encounter with patients including review of chart and various tests results, discussions about plan of care and coordination of care plan   Artis Delay, MD 07/06/2023 2:15 PM  INTERVAL HISTORY: Please see below for problem oriented charting. she returns for treatment follow-up We discussed risk, benefits, side effects of treatment today She is asymptomatic from her disease We discussed the rationale of switching treatment and why surgery is not indicated  REVIEW OF SYSTEMS:   Constitutional: Denies fevers, chills or abnormal weight loss Eyes: Denies blurriness of vision Ears, nose, mouth, throat, and face: Denies mucositis or sore throat Respiratory: Denies cough, dyspnea or  wheezes Cardiovascular: Denies palpitation, chest discomfort or lower extremity swelling Gastrointestinal:  Denies nausea, heartburn or change in bowel habits Skin: Denies abnormal skin rashes Lymphatics: Denies new lymphadenopathy or easy bruising Neurological:Denies numbness, tingling or new weaknesses Behavioral/Psych: Mood is stable, no new changes  All other systems were reviewed with the patient and are negative.  I have reviewed the past medical history, past surgical history, social history and family history with the patient and they are unchanged from previous note.  ALLERGIES:  has No Known Allergies.  MEDICATIONS:  Current Outpatient Medications  Medication Sig Dispense Refill   acetaminophen (TYLENOL) 500 MG tablet Take 500 mg by mouth every 6 (six) hours as needed for mild pain, moderate pain or fever.     aspirin 81 MG EC tablet Take by mouth daily.     Cholecalciferol (VITAMIN D3 PO) Take 1 tablet by mouth daily.     furosemide (LASIX) 20 MG tablet Take 1 tablet (20 mg total) by mouth daily. 30 tablet 3   lidocaine-prilocaine (EMLA) cream Apply 1 Application topically as needed. 30 g 3   loratadine (CLARITIN) 10 MG tablet Take 10 mg by mouth daily.     LORazepam (ATIVAN) 0.5 MG tablet Take 1 tablet (0.5 mg total) by mouth 2 (two) times daily as needed for anxiety. 10 tablet 0   losartan (COZAAR) 50 MG tablet Take 1 tablet (50 mg total) by mouth daily. 90 tablet 1   metoprolol tartrate (LOPRESSOR) 25 MG tablet Take 1 tablet (25 mg total) by mouth 2 (two) times daily. 180 tablet 1   Multiple Vitamin (MULTIVITAMIN WITH MINERALS) TABS tablet Take 1 tablet by mouth daily. 30 tablet 1   omeprazole (PRILOSEC) 20 MG  capsule Take 1 capsule (20 mg total) by mouth 2 (two) times daily. 28 capsule 0   ondansetron (ZOFRAN) 8 MG tablet Take 1 tablet (8 mg total) by mouth every 8 (eight) hours as needed for nausea. 30 tablet 3   tamoxifen (NOLVADEX) 20 MG tablet Take 1 tablet (20 mg  total) by mouth daily. 30 tablet 3   No current facility-administered medications for this visit.   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 No extra uterine extension. No evidence of metastatic disease within the pelvis.   Mild thickening of the endometrial stripe, likely related to entrapment by the adjacent uterine mass   06/03/2021 Initial Diagnosis   Uterine sarcoma (HCC)   06/03/2021 Cancer Staging   Staging form: Corpus Uteri - Sarcoma, AJCC 7th Edition - Clinical stage from 06/03/2021: FIGO Stage IVB (rT1b, N0, M1) - Signed by Artis Delay, MD on 03/31/2022 Diagnostic confirmation: Positive histology Stage prefix: Recurrence Biopsy of metastatic site performed: No Lymph-vascular invasion (LVI): LVI present/identified, NOS   06/03/2021 Pathology Results   FINAL MICROSCOPIC DIAGNOSIS:   A. UTERUS, CERVIX, BILATERAL FALLOPIAN TUBES AND OVARIES, HYSTERECTOMY:  - Uterus:       Mixed high grade uterine sarcoma, spanning 6 cm, see comment.       Extensive lymphovascular invasion.       See oncology table.  - Cervix: Benign squamous and endocervical mucosa. No dysplasia or  malignancy.  - Bilateral ovaries: Unremarkable. No malignancy.  - Bilateral fallopian tubes: Unremarkable. No malignancy.   ONCOLOGY TABLE:   UTERUS, SARCOMA: Resection   Procedure: Total hysterectomy and  bilateral salpingo-oophorectomy  Specimen Integrity: Focally disrupted on posterior surface  Tumor Site: Anterior wall  Tumor Size: 6 cm  Histologic Type: High grade sarcoma, mixed. See comment.  Other Tissue/ Organ Involvement: Not identified  Lymphovascular Invasion: Present, extensive.  Margins: All margins negative for tumor  Regional Lymph Nodes: Not applicable (no lymph nodes submitted or found)  Distant Metastasis:       Distant Site(s) Involved: Not applicable  Pathologic Stage Classification (pTNM, AJCC 8th Edition): pT1b, pN not  assigned  Ancillary Studies: Can be performed upon request  Representative Tumor Block: A6  Comment(s): The tumor has two morphologically different components. One component consists of malignant spindle cells which can be seen arising from the smooth muscle. These foci are positive for SMA, desmin, cyclinD1 (focal), and CD10 (patchy). The other component consists of round to slightly spindle cells with abundant admixed vessels and occasional large pleomorphic cells. These areas are positive for SMA (patchy weak), desmin (focal), CD10 (variable with diffuse areas). Pancytokeratin is negative. 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   ADDENDUM:   PROGNOSTIC INDICATOR RESULTS:   Immunohistochemical and morphometric analysis performed manually    Estrogen Receptor:       POSITIVE, 50%, WEAK TO MODERATE STAINING  Progesterone Receptor:   POSITIVE, 50%, MODERATE TO STRONG  STAINING   Reference Range Estrogen and Progesterone Receptor       Negative  0%       Positive  >1%    06/03/2021 Surgery   Surgeon: Quinn Axe    Assistants: Dr Antionette Char (an MD assistant was necessary for tissue manipulation, management of robotic instrumentation, retraction and positioning due to the complexity of the case and [facility] policies).  Operation: Robotic-assisted laparoscopic  total hysterectomy >250gm with bilateral salpingoophorectomy, minilaparotomy for specimen delivery   Surgeon: Quinn Axe    Operative Findings:  : Bulky 16cm uterus with intra-uterine mass (consistent with a fibroid-like mass) , smooth normal appearing serosa, no suspicious bulky nodes. Normal ovaries bilaterally. Normal upper abdomen. Uterus too large to deliver vaginally in tact.    06/18/2021 Imaging   1. Multiple bilateral pulmonary nodules, measuring up to 9 mm. Most of these are perifissural and subpleural in location, likely lymph nodes. Nevertheless, close follow-up recommended to exclude metastatic disease. 2. 11 mm hypoattenuating lesion towards the dome of the liver, indeterminate. MRI abdomen with and without contrast could be used to further evaluate as clinically warranted. 3. Aortic Atherosclerosis (ICD10-I70.0).   10/13/2021 Imaging   1. No acute findings within the abdomen or pelvis. No specific findings identified to suggest residual or recurrence of tumor. 2. Stable small pulmonary nodules within the left lower lobe.   03/18/2022 Imaging   1. Interval development of 2 soft tissue nodules in the anterior pelvis measuring 10 mm and 7 mm respectively. Close follow-up recommended to exclude metastatic disease. PET-CT may be warranted to further evaluate. 2. Development of 2 mm left upper lobe parenchymal nodule, nonspecific. Close attention on follow-up imaging recommended  3. Stable appearance of index bilateral pulmonary nodules identified previously. 4. Aortic Atherosclerosis (ICD10-I70.0).   04/02/2022 PET scan   1. Signs of local tumor recurrence noted at the vaginal cuff. 2. 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 New since 10/13/2021 and progressive when compared with 03/17/2022. 3. New focal area of increased uptake within the right biceps femoris muscle which is suspicious for skeletal muscle involvement. 4. Stable  appearance of small pulmonary nodules described on 03/17/2022. These are too small to characterize by PET-CT.   04/09/2022 Procedure   Successful placement of a right IJ approach Power Port with ultrasound and fluoroscopic guidance. The catheter is ready for use.   04/09/2022 Echocardiogram    1. Left ventricular ejection fraction, by estimation, is 65 to 70%. The left ventricle has normal function. The left ventricle has no regional wall motion abnormalities. There is mild concentric left ventricular hypertrophy. Left ventricular diastolic parameters are consistent with Grade I diastolic dysfunction (impaired relaxation).  2. Right ventricular systolic function is normal. The right ventricular size is normal.  3. The mitral valve is normal in structure. No evidence of mitral valve regurgitation.  4. The aortic valve is tricuspid. Aortic valve regurgitation is not visualized. No aortic stenosis is present.  5. The inferior vena cava is normal in size with greater than 50% respiratory variability, suggesting right atrial pressure of 3 mmHg.   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 Status post hysterectomy and bilateral oophorectomy. Multiple newand enlarged peritoneal nodules throughout the low abdomen and pelvis, previously FDG avid and consistent with worsened locally recurrent and peritoneal metastatic disease. 2. Multiple small bilateral pulmonary nodules, several of  which are slightly enlarged compared to prior examination, consistent with worsened pulmonary metastatic disease.   Aortic Atherosclerosis (ICD10-I70.0).     06/25/2022 - 07/06/2022 Chemotherapy   Patient is on Treatment Plan : UTERINE UNDIFFERENTIATED / LEIOMYOSARCOMA Gemcitabine D1,8 + Docetaxel D8 (900/100) q21d     06/25/2022 - 03/04/2023 Chemotherapy   Patient is on Treatment Plan : UTERINE UNDIFFERENTIATED LEIOMYOSARCOMA  Gemcitabine D1,8 + Docetaxel D8 (900/100) q21d     09/02/2022 Imaging   1. Moderate response to therapy of peritoneal metastasis within the anterior pelvis. 2. Minimal response to therapy of pulmonary metastasis. 3. No new or progressive disease. 4.  Aortic Atherosclerosis (ICD10-I70.0).     12/01/2022 Imaging   1. No new or progressive findings in the chest, abdomen, or pelvis. 2. Peritoneal metastases in the pelvis described previously measure smaller today compatible with interval response to therapy. 3. Stable tiny bilateral pulmonary nodules. Previously characterized as metastatic lesions, these are unchanged in the interval. No new suspicious pulmonary nodule or mass. 4.  Aortic Atherosclerois (ICD10-170.0)   03/04/2023 Imaging   CT CHEST ABDOMEN PELVIS W CONTRAST  Result Date: 03/03/2023 CLINICAL DATA:  Cervical cancer * Tracking Code: BO *. Assess treatment response. Current chemotherapy. Prior total hysterectomy EXAM: CT CHEST, ABDOMEN, AND PELVIS WITH CONTRAST TECHNIQUE: Multidetector CT imaging of the chest, abdomen and pelvis was performed following the standard protocol during bolus administration of intravenous contrast. RADIATION DOSE REDUCTION: This exam was performed according to the departmental dose-optimization program which includes automated exposure control, adjustment of the mA and/or kV according to patient size and/or use of iterative reconstruction technique. CONTRAST:  OMNIPAQUE IOHEXOL 300 MG/ML  SOLN COMPARISON:  CT 12/01/2022 and older FINDINGS: CT CHEST FINDINGS Cardiovascular: Right upper chest port. Heart is nonenlarged. Trace pericardial fluid. Normal caliber thoracic aorta. Mediastinum/Nodes: No specific abnormal lymph node enlargement seen in the axillary region, hilum or mediastinum. Mildly patulous thoracic esophagus. Thyroid gland is unremarkable. Lungs/Pleura: Tiny pleural effusions are seen, left-greater-than-right. These new from previous. There is  some patchy parenchymal opacity identified in the superior segment of the lower lobes, left-greater-than-right. This could be infiltrative rather than neoplastic. Recommend short follow-up. There were some small nodules identified on the prior. For example 3 mm nodule lateral left lower lobe subpleural on series 7, image 91 is stable. Right lower lobe subpleural nodule on the prior which measured 9 x 6 mm, today is stable on image 73 of series 7. Stable 2-3 mm nodule right lower lobe on series 7, image 97. Musculoskeletal: Curvature of the spine with some degenerative changes. CT ABDOMEN PELVIS FINDINGS Hepatobiliary: Stable dome hepatic cyst. No new space-occupying lesion in the liver. Patent portal vein. Gallbladder is distended. Pancreas: Unremarkable. No pancreatic ductal dilatation or surrounding inflammatory changes. Spleen: Spleen is slightly enlarged at 13.1 cm in AP length. Preserved enhancement. Small splenule inferiorly. Adrenals/Urinary Tract: The adrenal glands are preserved. Tiny low-attenuation cystic lesions are seen which are too small to completely characterize along the kidneys. Bosniak 2 lesions. No specific imaging follow-up. The ureters have normal course and caliber down to the bladder. Bladder wall is diffusely thickened. The bladder is contracted. There is significant stranding. This is progressive from the previous examination. Stomach/Bowel: Large bowel has a normal course and caliber with scattered stool. Overall moderate stool burden. Normal appendix extends inferior to the cecum in the right lower quadrant. The stomach and small bowel are nondilated. Vascular/Lymphatic: Normal caliber aorta and IVC with some vascular calcifications. No specific abnormal lymph node  enlargement identified in the abdomen and pelvis. Reproductive: Absence of the uterus and ovaries. Other: Once again there is a pelvic soft tissue nodule anteriorly on series 2, image 104 which on the prior measured 2.1 x 1.6  cm and today 1.9 x 1.3 cm. Smaller foci more caudal on axial image 111 and 112 are stable. Musculoskeletal: Curvature and degenerative changes are seen along the spine. Degenerative changes along the pelvis. IMPRESSION: Stable pelvic peritoneal nodules. No new lymph node enlargement seen in the chest, abdomen or pelvis. New tiny pleural effusions, left-greater-than-right with some ill-defined parenchymal opacities along the lower lobes. Although there is a differential this could be infectious or inflammatory. Recommend short follow-up and correlation to specific symptoms. Previous tiny lung nodules are stable. Continued follow-up surveillance as per the patient's neoplasm. Mild splenomegaly. Worsening bladder wall thickening and stranding. Please correlate with symptoms and treatment Electronically Signed   By: Karen Kays M.D.   On: 03/03/2023 18:23      04/05/2023 Imaging   Normal bone density scan   06/21/2023 Imaging   CT CHEST ABDOMEN PELVIS W CONTRAST  Result Date: 06/20/2023 CLINICAL DATA:  Endometrial cancer restaging, chemotherapy complete, ongoing oral chemotherapy * Tracking Code: BO * EXAM: CT CHEST, ABDOMEN, AND PELVIS WITH CONTRAST TECHNIQUE: Multidetector CT imaging of the chest, abdomen and pelvis was performed following the standard protocol during bolus administration of intravenous contrast. RADIATION DOSE REDUCTION: This exam was performed according to the departmental dose-optimization program which includes automated exposure control, adjustment of the mA and/or kV according to patient size and/or use of iterative reconstruction technique. CONTRAST:  OMNIPAQUE IOHEXOL 300 MG/ML  SOLN COMPARISON:  CT chest angiogram, 03/23/2023, CT chest abdomen pelvis, 03/02/2023 FINDINGS: CT CHEST FINDINGS Cardiovascular: No significant vascular findings. Normal heart size. No pericardial effusion. Mediastinum/Nodes: No enlarged mediastinal, hilar, or axillary lymph nodes. Thyroid gland, trachea,  and esophagus demonstrate no significant findings. Lungs/Pleura: Interval resolution of previously seen pleural effusions. Significant interval improvement of previously seen heterogeneous and ground-glass airspace opacity, with small residual opacities present in the lower lobes (series 4, image 68). Multiple small subpleural nodules are unchanged, largest in the dependent right lower lobe measuring 0.9 cm (series 4, image 75). Musculoskeletal: No chest wall abnormality. No acute osseous findings. CT ABDOMEN PELVIS FINDINGS Hepatobiliary: No solid liver abnormality is seen. Benign simple cyst of the liver dome, requiring no further follow-up or characterization (series 2, image 45). No gallstones, gallbladder wall thickening, or biliary dilatation. Pancreas: Unremarkable. No pancreatic ductal dilatation or surrounding inflammatory changes. Spleen: Normal in size without significant abnormality. Adrenals/Urinary Tract: Adrenal glands are unremarkable. Simple, benign right renal cortical cysts, for which no further follow-up or characterization is required. Kidneys are otherwise normal, without renal calculi, solid lesion, or hydronephrosis. Bladder is unremarkable. Stomach/Bowel: Stomach is within normal limits. Appendix appears normal. No evidence of bowel wall thickening, distention, or inflammatory changes. Moderate burden of stool throughout the colon and rectum. Vascular/Lymphatic: No significant vascular findings are present. No enlarged abdominal or pelvic lymph nodes. Reproductive: Status post hysterectomy. Other: No abdominal wall hernia or abnormality. No ascites. Largest soft tissue nodule in the low midline abdomen is increased in size, measuring 2.8 x 2.5 cm, previously 2.4 x 2.1 cm (series 2, image 105). Multiple additional soft tissue nodules throughout the pelvis are unchanged, largest in the anterior right pelvis measuring 1.6 x 1.2 cm (series 2, image 112). Additional nodule at the right aspect of  the vaginal cuff measures 2.0 x 1.0 cm (  series 2, image 112). Musculoskeletal: No acute osseous findings. IMPRESSION: 1. Largest peritoneal soft tissue nodule in the low midline abdomen is increased in size, consistent with worsened peritoneal metastatic disease. Multiple additional soft tissue nodules throughout the pelvis are unchanged. 2. 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 3. Interval resolution of previously seen pleural effusions. 4. Significant interval improvement of previously seen heterogeneous and ground-glass airspace opacity, with small residual opacities present in the lower lobes. Findings are consistent with improved nonspecific infection or inflammation. 5. Status post hysterectomy. Electronically Signed   By: Jearld Lesch M.D.   On: 06/20/2023 20:49        PHYSICAL EXAMINATION: ECOG PERFORMANCE STATUS: 1 - Symptomatic but completely ambulatory  There were no vitals filed for this visit. There were no vitals filed for this visit.  GENERAL:alert, no distress and comfortable NEURO: alert & oriented x 3 with fluent speech, no focal motor/sensory deficits  LABORATORY DATA:  I have reviewed the data as listed    Component Value Date/Time   NA 139 06/18/2023 0747   K 4.1 06/18/2023 0747   CL 103 06/18/2023 0747   CO2 31 06/18/2023 0747   GLUCOSE 102 (H) 06/18/2023 0747   BUN 21 06/18/2023 0747   CREATININE 0.59 06/18/2023 0747   CREATININE 0.85 03/18/2023 0738   CALCIUM 9.5 06/18/2023 0747   PROT 6.6 06/18/2023 0747   ALBUMIN 3.6 06/18/2023 0747   AST 16 06/18/2023 0747   AST 17 03/18/2023 0738   ALT 12 06/18/2023 0747   ALT 9 03/18/2023 0738   ALKPHOS 99 06/18/2023 0747   BILITOT 0.2 (L) 06/18/2023 0747   BILITOT 0.4 03/18/2023 0738   GFRNONAA >60 06/18/2023 0747   GFRNONAA >60 03/18/2023 0738    No results found for: "SPEP", "UPEP"  Lab Results  Component Value Date   WBC 6.3 07/06/2023   NEUTROABS 2.5 07/06/2023   HGB  12.8 07/06/2023   HCT 37.8 07/06/2023   MCV 83.8 07/06/2023   PLT 229 07/06/2023      Chemistry      Component Value Date/Time   NA 139 06/18/2023 0747   K 4.1 06/18/2023 0747   CL 103 06/18/2023 0747   CO2 31 06/18/2023 0747   BUN 21 06/18/2023 0747   CREATININE 0.59 06/18/2023 0747   CREATININE 0.85 03/18/2023 0738      Component Value Date/Time   CALCIUM 9.5 06/18/2023 0747   ALKPHOS 99 06/18/2023 0747   AST 16 06/18/2023 0747   AST 17 03/18/2023 0738   ALT 12 06/18/2023 0747   ALT 9 03/18/2023 0738   BILITOT 0.2 (L) 06/18/2023 0747   BILITOT 0.4 03/18/2023 0738       RADIOGRAPHIC STUDIES: I have personally reviewed the radiological images as listed and agreed with the findings in the report. CT CHEST ABDOMEN PELVIS W CONTRAST  Result Date: 06/20/2023 CLINICAL DATA:  Endometrial cancer restaging, chemotherapy complete, ongoing oral chemotherapy * Tracking Code: BO * EXAM: CT CHEST, ABDOMEN, AND PELVIS WITH CONTRAST TECHNIQUE: Multidetector CT imaging of the chest, abdomen and pelvis was performed following the standard protocol during bolus administration of intravenous contrast. RADIATION DOSE REDUCTION: This exam was performed according to the departmental dose-optimization program which includes automated exposure control, adjustment of the mA and/or kV according to patient size and/or use of iterative reconstruction technique. CONTRAST:  OMNIPAQUE IOHEXOL 300 MG/ML  SOLN COMPARISON:  CT chest angiogram, 03/23/2023, CT chest abdomen pelvis, 03/02/2023 FINDINGS: CT CHEST FINDINGS Cardiovascular: No  significant vascular findings. Normal heart size. No pericardial effusion. Mediastinum/Nodes: No enlarged mediastinal, hilar, or axillary lymph nodes. Thyroid gland, trachea, and esophagus demonstrate no significant findings. Lungs/Pleura: Interval resolution of previously seen pleural effusions. Significant interval improvement of previously seen heterogeneous and ground-glass  airspace opacity, with small residual opacities present in the lower lobes (series 4, image 68). Multiple small subpleural nodules are unchanged, largest in the dependent right lower lobe measuring 0.9 cm (series 4, image 75). Musculoskeletal: No chest wall abnormality. No acute osseous findings. CT ABDOMEN PELVIS FINDINGS Hepatobiliary: No solid liver abnormality is seen. Benign simple cyst of the liver dome, requiring no further follow-up or characterization (series 2, image 45). No gallstones, gallbladder wall thickening, or biliary dilatation. Pancreas: Unremarkable. No pancreatic ductal dilatation or surrounding inflammatory changes. Spleen: Normal in size without significant abnormality. Adrenals/Urinary Tract: Adrenal glands are unremarkable. Simple, benign right renal cortical cysts, for which no further follow-up or characterization is required. Kidneys are otherwise normal, without renal calculi, solid lesion, or hydronephrosis. Bladder is unremarkable. Stomach/Bowel: Stomach is within normal limits. Appendix appears normal. No evidence of bowel wall thickening, distention, or inflammatory changes. Moderate burden of stool throughout the colon and rectum. Vascular/Lymphatic: No significant vascular findings are present. No enlarged abdominal or pelvic lymph nodes. Reproductive: Status post hysterectomy. Other: No abdominal wall hernia or abnormality. No ascites. Largest soft tissue nodule in the low midline abdomen is increased in size, measuring 2.8 x 2.5 cm, previously 2.4 x 2.1 cm (series 2, image 105). Multiple additional soft tissue nodules throughout the pelvis are unchanged, largest in the anterior right pelvis measuring 1.6 x 1.2 cm (series 2, image 112). Additional nodule at the right aspect of the vaginal cuff measures 2.0 x 1.0 cm (series 2, image 112). Musculoskeletal: No acute osseous findings. IMPRESSION: 1. Largest peritoneal soft tissue nodule in the low midline abdomen is increased in  size, consistent with worsened peritoneal metastatic disease. Multiple additional soft tissue nodules throughout the pelvis are unchanged. 2. 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 3. Interval resolution of previously seen pleural effusions. 4. Significant interval improvement of previously seen heterogeneous and ground-glass airspace opacity, with small residual opacities present in the lower lobes. Findings are consistent with improved nonspecific infection or inflammation. 5. Status post hysterectomy. Electronically Signed   By: Jearld Lesch M.D.   On: 06/20/2023 20:49

## 2023-07-06 NOTE — Assessment & Plan Note (Signed)
We have reviewed plan of care Recently, we reviewed imaging study results and discussed the risk, benefits, side effects of resumption of chemotherapy versus additional antiestrogen treatment Ultimately, the patient to use for additional antiestrogen therapy She is now switched to Lupron in combination with tamoxifen We discussed some of the side effects to be expected including bone aches, hot flashes, mood swing, depression and risk of DVT I recommend minimum 3 doses of Lupron injections before we repeat imaging study again end of October and she is in agreement

## 2023-07-07 ENCOUNTER — Telehealth: Payer: Self-pay | Admitting: Hematology and Oncology

## 2023-07-07 NOTE — Telephone Encounter (Signed)
Left patient a message regarding rescheduling current appointment

## 2023-07-12 ENCOUNTER — Encounter: Payer: Self-pay | Admitting: Hematology and Oncology

## 2023-07-22 ENCOUNTER — Ambulatory Visit: Payer: Managed Care, Other (non HMO) | Admitting: Internal Medicine

## 2023-07-27 ENCOUNTER — Ambulatory Visit: Payer: Managed Care, Other (non HMO) | Attending: Internal Medicine | Admitting: Internal Medicine

## 2023-07-27 ENCOUNTER — Encounter: Payer: Self-pay | Admitting: Internal Medicine

## 2023-07-27 VITALS — BP 122/80 | HR 75 | Ht 67.0 in | Wt 185.0 lb

## 2023-07-27 DIAGNOSIS — R0789 Other chest pain: Secondary | ICD-10-CM

## 2023-07-27 DIAGNOSIS — I10 Essential (primary) hypertension: Secondary | ICD-10-CM

## 2023-07-27 DIAGNOSIS — I5032 Chronic diastolic (congestive) heart failure: Secondary | ICD-10-CM

## 2023-07-27 NOTE — Patient Instructions (Signed)
Medication Instructions:  Your physician recommends that you continue on your current medications as directed. Please refer to the Current Medication list given to you today.   Labwork: None  Testing/Procedures: None  Follow-Up: Your physician recommends that you schedule a follow-up appointment in: As Needed  Any Other Special Instructions Will Be Listed Below (If Applicable).     If you need a refill on your cardiac medications before your next appointment, please call your pharmacy.   

## 2023-07-27 NOTE — Progress Notes (Signed)
Cardiology Office Note  Date: 07/27/2023   ID: Leah Diaz, DOB May 16, 1961, MRN [id number]  PCP:  Juliette Alcide, MD  Cardiologist:  Marjo Bicker, MD Electrophysiologist:  None    History of Present Illness:  Patient is a 62 year old F known to have HTN, asthma is here for follow-up visit.  Echo from 5/24 showed normal LVEF, G1 DD, normal RV function and no valvular heart disease.   Overall doing great, no interval angina, DOE, orthopnea, PND.  She believes the symptoms were from pneumonia as the symptoms resolved once she recovered.  Repeat imaging was also negative.  No dizziness, presyncope, syncope and palpitations.  Metoprolol tartrate 25 mg twice daily was increased to 50 mg bid in the last clinic visit due to exertional chest tightness but patient was taking 25 mg twice daily only.  Lasix was also being taken as as needed but not daily.  Past Medical History:  Diagnosis Date   Anemia    Arthritis    Asthma    remote history as a teenager   GERD (gastroesophageal reflux disease)    Hypertension    Menopausal symptoms 05/05/2011   Migraines 05/05/2011   Seasonal allergies    Uterine sarcoma (HCC)     Past Surgical History:  Procedure Laterality Date   ABDOMINAL HYSTERECTOMY     BREAST BIOPSY     x 2 benign   COLONOSCOPY N/A 09/18/2021   Procedure: COLONOSCOPY;  Surgeon: Malissa Hippo, MD;  Location: [facility];  Service: Endoscopy;  Laterality: N/A;  8:05   HYSTEROSCOPY  11/06/2020   IR IMAGING GUIDED PORT INSERTION  04/08/2022   ROBOTIC ASSISTED TOTAL HYSTERECTOMY WITH BILATERAL SALPINGO OOPHERECTOMY N/A 06/03/2021   Procedure: XI ROBOTIC ASSISTED TOTAL HYSTERECTOMY FOR UTERUS GREATER THAN 250G WITH BILATERAL SALPINGO OOPHORECTOMY;  Surgeon: Adolphus Birchwood, MD;  Location: [facility];  Service: Gynecology;  Laterality: N/A;   TUBAL LIGATION  05/05/2011    Current Outpatient Medications  Medication Sig Dispense Refill   acetaminophen (TYLENOL) 500  MG tablet Take 500 mg by mouth every 6 (six) hours as needed for mild pain, moderate pain or fever.     aspirin 81 MG EC tablet Take by mouth daily.     Cholecalciferol (VITAMIN D3 PO) Take 1 tablet by mouth daily.     furosemide (LASIX) 20 MG tablet Take 20 mg by mouth as needed for fluid or edema. Only takes 2-3 days per week     Leuprolide Acetate (LUPRON IJ) Inject as directed every 28 (twenty-eight) days.     lidocaine-prilocaine (EMLA) cream Apply 1 Application topically as needed. 30 g 3   loratadine (CLARITIN) 10 MG tablet Take 10 mg by mouth daily.     LORazepam (ATIVAN) 0.5 MG tablet Take 1 tablet (0.5 mg total) by mouth 2 (two) times daily as needed for anxiety. 10 tablet 0   losartan (COZAAR) 50 MG tablet Take 1 tablet (50 mg total) by mouth daily. 90 tablet 1   metoprolol tartrate (LOPRESSOR) 25 MG tablet Take 1 tablet (25 mg total) by mouth 2 (two) times daily. 180 tablet 1   Multiple Vitamin (MULTIVITAMIN WITH MINERALS) TABS tablet Take 1 tablet by mouth daily. 30 tablet 1   ondansetron (ZOFRAN) 8 MG tablet Take 1 tablet (8 mg total) by mouth every 8 (eight) hours as needed for nausea. 30 tablet 3   tamoxifen (NOLVADEX) 20 MG tablet Take 1 tablet (20 mg total) by mouth daily. 30 tablet 3  omeprazole (PRILOSEC) 20 MG capsule Take 1 capsule (20 mg total) by mouth 2 (two) times daily. 28 capsule 0   No current facility-administered medications for this visit.   Allergies:  Patient has no known allergies.   Social History: The patient  reports that she has never smoked. She has never used smokeless tobacco. She reports that she does not drink alcohol and does not use drugs.   Family History: The patient's family history includes Colon cancer in her maternal uncle; Pancreatic cancer in her maternal grandmother.   ROS:  Please see the history of present illness. Otherwise, complete review of systems is positive for none  All other systems are reviewed and negative.   Physical  Exam: VS:  BP 122/80   Pulse 75   Ht 5\' 7"  (1.702 m)   Wt 185 lb (83.9 kg)   SpO2 97%   BMI 28.98 kg/m , BMI Body mass index is 28.98 kg/m.  Wt Readings from Last 3 Encounters:  07/27/23 185 lb (83.9 kg)  07/06/23 182 lb 6 oz (82.7 kg)  06/25/23 178 lb 12.8 oz (81.1 kg)    General: Patient appears comfortable at rest. HEENT: Conjunctiva and lids normal, oropharynx clear with moist mucosa. Neck: Supple, no elevated JVP or carotid bruits, no thyromegaly. Lungs: Clear to auscultation, nonlabored breathing at rest. Cardiac: Regular rate and rhythm, no S3 or significant systolic murmur, no pericardial rub. Abdomen: Soft, nontender, no hepatomegaly, bowel sounds present, no guarding or rebound. Extremities: No pitting edema, distal pulses 2+. Skin: Warm and dry. Musculoskeletal: No kyphosis. Neuropsychiatric: Alert and oriented x3, affect grossly appropriate.  Recent Labwork: 03/23/2023: B Natriuretic Peptide 95.0 03/24/2023: Magnesium 2.2; TSH 4.757 07/06/2023: ALT 13; AST 16; BUN 12; Creatinine, Ser 0.56; Hemoglobin 12.8; Platelets 229; Potassium 4.0; Sodium 139  No results found for: "CHOL", "TRIG", "HDL", "CHOLHDL", "VLDL", "LDLCALC", "LDLDIRECT"   Assessment and Plan:  Patient is a 62 year old F known to have HTN, asthma is here for follow-up visit.  Noncardiac chest pain: Patient was admitted in 03/2023 with acute hypoxic respiratory failure likely secondary to community-acquired pneumonia versus acute diastolic heart failure exacerbation. She continued to have chest tightness and SOB upon discharge for which she was referred to cardiology clinic.  She was started on metoprolol tartrate 25 mg twice daily while in the hospital due to tachycardia and I increased to 50 mg twice daily due to exertional chest tightness but patient was taking 25 mg twice daily. No interval chest pain, DOE, orthopnea, PND.  I believe these symptoms were from her pneumonia and repeat imaging was negative.  No  further ischemia evaluation is indicated at this time.  Chronic diastolic heart failure: SOB symptoms likely secondary to pneumonia.  No interval DOE, orthopnea, PND.  Taking Lasix as needed for the symptoms.  HTN, controlled: Continue losartan 50 mg once daily, metoprolol tartrate 25 mg twice daily and p.o. Lasix 20 mg once daily as needed for SOB/LE swelling.     Disposition:  Follow up PRN  Signed Shyah Cadmus Verne Spurr, MD, 07/27/2023 10:56 AM    [facility] [address] [place], [place], [place] [address]

## 2023-08-03 ENCOUNTER — Inpatient Hospital Stay: Payer: Managed Care, Other (non HMO) | Attending: Gynecologic Oncology

## 2023-08-03 ENCOUNTER — Inpatient Hospital Stay: Payer: Managed Care, Other (non HMO)

## 2023-08-03 ENCOUNTER — Encounter: Payer: Self-pay | Admitting: Hematology and Oncology

## 2023-08-03 ENCOUNTER — Inpatient Hospital Stay (HOSPITAL_BASED_OUTPATIENT_CLINIC_OR_DEPARTMENT_OTHER): Payer: Managed Care, Other (non HMO) | Admitting: Hematology and Oncology

## 2023-08-03 VITALS — BP 131/75 | HR 79 | Temp 98.5°F | Resp 18 | Ht 67.0 in | Wt 184.4 lb

## 2023-08-03 DIAGNOSIS — Z79810 Long term (current) use of selective estrogen receptor modulators (SERMs): Secondary | ICD-10-CM | POA: Diagnosis not present

## 2023-08-03 DIAGNOSIS — Z90722 Acquired absence of ovaries, bilateral: Secondary | ICD-10-CM | POA: Diagnosis not present

## 2023-08-03 DIAGNOSIS — C549 Malignant neoplasm of corpus uteri, unspecified: Secondary | ICD-10-CM

## 2023-08-03 DIAGNOSIS — Z780 Asymptomatic menopausal state: Secondary | ICD-10-CM

## 2023-08-03 DIAGNOSIS — R232 Flushing: Secondary | ICD-10-CM | POA: Insufficient documentation

## 2023-08-03 DIAGNOSIS — C55 Malignant neoplasm of uterus, part unspecified: Secondary | ICD-10-CM | POA: Insufficient documentation

## 2023-08-03 DIAGNOSIS — Z90710 Acquired absence of both cervix and uterus: Secondary | ICD-10-CM | POA: Diagnosis not present

## 2023-08-03 LAB — CBC WITH DIFFERENTIAL/PLATELET
Abs Immature Granulocytes: 0.01 10*3/uL (ref 0.00–0.07)
Basophils Absolute: 0.1 10*3/uL (ref 0.0–0.1)
Basophils Relative: 1 %
Eosinophils Absolute: 0.6 10*3/uL — ABNORMAL HIGH (ref 0.0–0.5)
Eosinophils Relative: 11 %
HCT: 38.9 % (ref 36.0–46.0)
Hemoglobin: 13.2 g/dL (ref 12.0–15.0)
Immature Granulocytes: 0 %
Lymphocytes Relative: 44 %
Lymphs Abs: 2.4 10*3/uL (ref 0.7–4.0)
MCH: 28.6 pg (ref 26.0–34.0)
MCHC: 33.9 g/dL (ref 30.0–36.0)
MCV: 84.4 fL (ref 80.0–100.0)
Monocytes Absolute: 0.6 10*3/uL (ref 0.1–1.0)
Monocytes Relative: 11 %
Neutro Abs: 1.8 10*3/uL (ref 1.7–7.7)
Neutrophils Relative %: 33 %
Platelets: 219 10*3/uL (ref 150–400)
RBC: 4.61 MIL/uL (ref 3.87–5.11)
RDW: 17.6 % — ABNORMAL HIGH (ref 11.5–15.5)
WBC: 5.5 10*3/uL (ref 4.0–10.5)
nRBC: 0 % (ref 0.0–0.2)

## 2023-08-03 LAB — COMPREHENSIVE METABOLIC PANEL
ALT: 12 U/L (ref 0–44)
AST: 17 U/L (ref 15–41)
Albumin: 3.5 g/dL (ref 3.5–5.0)
Alkaline Phosphatase: 98 U/L (ref 38–126)
Anion gap: 4 — ABNORMAL LOW (ref 5–15)
BUN: 15 mg/dL (ref 8–23)
CO2: 28 mmol/L (ref 22–32)
Calcium: 9.3 mg/dL (ref 8.9–10.3)
Chloride: 105 mmol/L (ref 98–111)
Creatinine, Ser: 0.62 mg/dL (ref 0.44–1.00)
GFR, Estimated: >60 mL/min (ref >60)
Glucose, Bld: 105 mg/dL — ABNORMAL HIGH (ref 70–99)
Potassium: 4.4 mmol/L (ref 3.5–5.1)
Sodium: 137 mmol/L (ref 135–145)
Total Bilirubin: 0.4 mg/dL (ref 0.3–1.2)
Total Protein: 6.5 g/dL (ref 6.5–8.1)

## 2023-08-03 MED ORDER — LEUPROLIDE ACETATE 3.75 MG IM KIT
3.7500 mg | PACK | INTRAMUSCULAR | Status: DC
  Administered 2023-08-03: 3.75 mg via INTRAMUSCULAR
  Filled 2023-08-03: qty 3.75

## 2023-08-03 MED ORDER — HEPARIN SOD (PORK) LOCK FLUSH 100 UNIT/ML IV SOLN
500.0000 [IU] | Freq: Once | INTRAVENOUS | Status: AC
  Administered 2023-08-03: 500 [IU]

## 2023-08-03 MED ORDER — SODIUM CHLORIDE 0.9% FLUSH
10.0000 mL | Freq: Once | INTRAVENOUS | Status: AC
  Administered 2023-08-03: 10 mL

## 2023-08-03 NOTE — Assessment & Plan Note (Signed)
She is now switched to Lupron in combination with tamoxifen She tolerated side effects with hot flashes but denies other major problems I recommend minimum 3 doses of Lupron injections before we repeat imaging study again end of October and she is in agreement

## 2023-08-03 NOTE — Progress Notes (Signed)
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   All questions were answered. The patient knows to call the clinic with any problems, questions or concerns. The total time spent in the appointment was 25 minutes encounter with patients including review of chart and various tests results, discussions about plan of care and coordination of care plan   Artis Delay, MD 08/03/2023 10:34 AM  INTERVAL HISTORY: Please see below for problem oriented charting. she returns for treatment follow-up on Lupron and tamoxifen She has some minor intermittent hot flashes but otherwise tolerated treatment well No other side effects  REVIEW OF SYSTEMS:   Constitutional: Denies fevers, chills or abnormal weight loss Eyes: Denies blurriness of vision Ears, nose, mouth, throat, and face: Denies mucositis or sore throat Respiratory: Denies cough, dyspnea or wheezes Cardiovascular: Denies palpitation, chest discomfort or lower extremity swelling Gastrointestinal:  Denies nausea, heartburn or change in bowel habits Skin: Denies abnormal skin rashes Lymphatics: Denies new lymphadenopathy or easy bruising Neurological:Denies numbness, tingling or new  weaknesses Behavioral/Psych: Mood is stable, no new changes  All other systems were reviewed with the patient and are negative.  I have reviewed the past medical history, past surgical history, social history and family history with the patient and they are unchanged from previous note.  ALLERGIES:  has No Known Allergies.  MEDICATIONS:  Current Outpatient Medications  Medication Sig Dispense Refill   acetaminophen (TYLENOL) 500 MG tablet Take 500 mg by mouth every 6 (six) hours as needed for mild pain, moderate pain or fever.     aspirin 81 MG EC tablet Take by mouth daily.     Cholecalciferol (VITAMIN D3 PO) Take 1 tablet by mouth daily.     furosemide (LASIX) 20 MG tablet Take 20 mg by mouth as needed for fluid or edema. Only takes 2-3 days per week     Leuprolide Acetate (LUPRON IJ) Inject as directed every 28 (twenty-eight) days.     lidocaine-prilocaine (EMLA) cream Apply 1 Application topically as needed. 30 g 3   loratadine (CLARITIN) 10 MG tablet Take 10 mg by mouth daily.     LORazepam (ATIVAN) 0.5 MG tablet Take 1 tablet (0.5 mg total) by mouth 2 (two) times daily as needed for anxiety. 10 tablet 0   losartan (COZAAR) 50 MG tablet Take 1 tablet (50 mg total) by mouth daily. 90 tablet 1   metoprolol tartrate (LOPRESSOR) 25 MG tablet Take 1 tablet (25 mg total) by mouth 2 (two) times daily. 180 tablet 1   Multiple Vitamin (MULTIVITAMIN WITH MINERALS) TABS tablet Take 1 tablet by mouth daily. 30 tablet 1   omeprazole (PRILOSEC) 20 MG capsule Take 1 capsule (20 mg total) by mouth 2 (two) times daily. 28 capsule 0   ondansetron (ZOFRAN)  8 MG tablet Take 1 tablet (8 mg total) by mouth every 8 (eight) hours as needed for nausea. 30 tablet 3   tamoxifen (NOLVADEX) 20 MG tablet Take 1 tablet (20 mg total) by mouth daily. 30 tablet 3   No current facility-administered medications for this visit.   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 No extra uterine extension. No evidence of metastatic disease within the pelvis.   Mild thickening of the endometrial stripe, likely related to entrapment by the adjacent uterine mass   06/03/2021 Initial Diagnosis   Uterine sarcoma (HCC)   06/03/2021 Cancer Staging   Staging form: Corpus Uteri - Sarcoma, AJCC 7th Edition - Clinical stage from 06/03/2021: FIGO Stage IVB (rT1b, N0, M1) - Signed by Artis Delay, MD on 03/31/2022 Diagnostic confirmation: Positive histology Stage prefix: Recurrence Biopsy of metastatic site performed: No Lymph-vascular invasion (LVI): LVI present/identified, NOS   06/03/2021 Pathology Results   FINAL MICROSCOPIC DIAGNOSIS:   A. UTERUS, CERVIX, BILATERAL FALLOPIAN TUBES AND OVARIES, HYSTERECTOMY:  - Uterus:       Mixed high grade uterine sarcoma, spanning 6 cm, see comment.       Extensive lymphovascular invasion.       See oncology table.  - Cervix: Benign squamous and endocervical mucosa. No dysplasia or  malignancy.  - Bilateral ovaries: Unremarkable. No malignancy.  - Bilateral fallopian tubes: Unremarkable. No malignancy.   ONCOLOGY TABLE:   UTERUS, SARCOMA: Resection   Procedure: Total hysterectomy and bilateral salpingo-oophorectomy  Specimen Integrity: Focally disrupted on posterior surface  Tumor Site: Anterior wall  Tumor Size: 6 cm  Histologic Type: High grade sarcoma, mixed. See comment.   Other Tissue/ Organ Involvement: Not identified  Lymphovascular Invasion: Present, extensive.  Margins: All margins negative for tumor  Regional Lymph Nodes: Not applicable (no lymph nodes submitted or found)  Distant Metastasis:       Distant Site(s) Involved: Not applicable  Pathologic Stage Classification (pTNM, AJCC 8th Edition): pT1b, pN not  assigned  Ancillary Studies: Can be performed upon request  Representative Tumor Block: A6  Comment(s): The tumor has two morphologically different components. One component consists of malignant spindle cells which can be seen arising from the smooth muscle. These foci are positive for SMA, desmin, cyclinD1 (focal), and CD10 (patchy). The other component consists of round to slightly spindle cells with abundant admixed vessels and occasional large pleomorphic cells. These areas are positive for SMA (patchy weak), desmin (focal), CD10 (variable with diffuse areas). Pancytokeratin is negative. 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   ADDENDUM:   PROGNOSTIC INDICATOR RESULTS:   Immunohistochemical and morphometric analysis performed manually    Estrogen Receptor:       POSITIVE, 50%, WEAK TO MODERATE STAINING  Progesterone Receptor:   POSITIVE, 50%, MODERATE TO STRONG STAINING   Reference Range Estrogen and Progesterone Receptor       Negative  0%  Positive  >1%    06/03/2021 Surgery   Surgeon: Quinn Axe    Assistants: Dr Antionette Char (an MD assistant was necessary for tissue manipulation, management of robotic instrumentation, retraction and positioning due to the complexity of the case and hospital policies).  Operation: Robotic-assisted laparoscopic total hysterectomy >250gm with bilateral salpingoophorectomy, minilaparotomy for specimen delivery   Surgeon: Quinn Axe    Operative Findings:  : Bulky 16cm uterus with intra-uterine  mass (consistent with a fibroid-like mass) , smooth normal appearing serosa, no suspicious bulky nodes. Normal ovaries bilaterally. Normal upper abdomen. Uterus too large to deliver vaginally in tact.    06/18/2021 Imaging   1. Multiple bilateral pulmonary nodules, measuring up to 9 mm. Most of these are perifissural and subpleural in location, likely lymph nodes. Nevertheless, close follow-up recommended to exclude metastatic disease. 2. 11 mm hypoattenuating lesion towards the dome of the liver, indeterminate. MRI abdomen with and without contrast could be used to further evaluate as clinically warranted. 3. Aortic Atherosclerosis (ICD10-I70.0).   10/13/2021 Imaging   1. No acute findings within the abdomen or pelvis. No specific findings identified to suggest residual or recurrence of tumor. 2. Stable small pulmonary nodules within the left lower lobe.   03/18/2022 Imaging   1. Interval development of 2 soft tissue nodules in the anterior pelvis measuring 10 mm and 7 mm respectively. Close follow-up recommended to exclude metastatic disease. PET-CT may be warranted to further evaluate. 2. Development of 2 mm left upper lobe parenchymal nodule, nonspecific. Close attention on follow-up imaging recommended  3. Stable appearance of index bilateral pulmonary nodules identified previously. 4. Aortic Atherosclerosis (ICD10-I70.0).   04/02/2022 PET scan   1. Signs of local tumor recurrence noted at the vaginal cuff. 2. 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 New since 10/13/2021 and progressive when compared with 03/17/2022. 3. New focal area of increased uptake within the right biceps femoris muscle which is suspicious for skeletal muscle involvement. 4. Stable appearance of small pulmonary nodules described on 03/17/2022. These are too small to characterize by PET-CT.   04/09/2022 Procedure   Successful placement of a right IJ approach Power Port with  ultrasound and fluoroscopic guidance. The catheter is ready for use.   04/09/2022 Echocardiogram    1. Left ventricular ejection fraction, by estimation, is 65 to 70%. The left ventricle has normal function. The left ventricle has no regional wall motion abnormalities. There is mild concentric left ventricular hypertrophy. Left ventricular diastolic parameters are consistent with Grade I diastolic dysfunction (impaired relaxation).  2. Right ventricular systolic function is normal. The right ventricular size is normal.  3. The mitral valve is normal in structure. No evidence of mitral valve regurgitation.  4. The aortic valve is tricuspid. Aortic valve regurgitation is not visualized. No aortic stenosis is present.  5. The inferior vena cava is normal in size with greater than 50% respiratory variability, suggesting right atrial pressure of 3 mmHg.   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 Status post hysterectomy and bilateral oophorectomy. Multiple newand enlarged peritoneal nodules throughout the low abdomen and pelvis, previously FDG avid and consistent with worsened locally recurrent and peritoneal metastatic disease. 2. Multiple small bilateral pulmonary nodules, several of which are slightly enlarged compared to prior examination, consistent with worsened pulmonary metastatic disease.   Aortic Atherosclerosis (ICD10-I70.0).     06/25/2022 -  07/06/2022 Chemotherapy   Patient is on Treatment Plan : UTERINE UNDIFFERENTIATED / LEIOMYOSARCOMA Gemcitabine D1,8 + Docetaxel D8 (900/100) q21d     06/25/2022 - 03/04/2023 Chemotherapy   Patient is on Treatment Plan : UTERINE UNDIFFERENTIATED LEIOMYOSARCOMA Gemcitabine D1,8 + Docetaxel D8 (900/100) q21d     09/02/2022 Imaging   1. Moderate response to therapy of peritoneal metastasis within the anterior pelvis. 2. Minimal response to therapy of  pulmonary metastasis. 3. No new or progressive disease. 4.  Aortic Atherosclerosis (ICD10-I70.0).     12/01/2022 Imaging   1. No new or progressive findings in the chest, abdomen, or pelvis. 2. Peritoneal metastases in the pelvis described previously measure smaller today compatible with interval response to therapy. 3. Stable tiny bilateral pulmonary nodules. Previously characterized as metastatic lesions, these are unchanged in the interval. No new suspicious pulmonary nodule or mass. 4.  Aortic Atherosclerois (ICD10-170.0)   03/04/2023 Imaging   CT CHEST ABDOMEN PELVIS W CONTRAST  Result Date: 03/03/2023 CLINICAL DATA:  Cervical cancer * Tracking Code: BO *. Assess treatment response. Current chemotherapy. Prior total hysterectomy EXAM: CT CHEST, ABDOMEN, AND PELVIS WITH CONTRAST TECHNIQUE: Multidetector CT imaging of the chest, abdomen and pelvis was performed following the standard protocol during bolus administration of intravenous contrast. RADIATION DOSE REDUCTION: This exam was performed according to the departmental dose-optimization program which includes automated exposure control, adjustment of the mA and/or kV according to patient size and/or use of iterative reconstruction technique. CONTRAST:  OMNIPAQUE IOHEXOL 300 MG/ML  SOLN COMPARISON:  CT 12/01/2022 and older FINDINGS: CT CHEST FINDINGS Cardiovascular: Right upper chest port. Heart is nonenlarged. Trace pericardial fluid. Normal caliber thoracic aorta. Mediastinum/Nodes: No specific abnormal lymph node enlargement seen in the axillary region, hilum or mediastinum. Mildly patulous thoracic esophagus. Thyroid gland is unremarkable. Lungs/Pleura: Tiny pleural effusions are seen, left-greater-than-right. These new from previous. There is some patchy parenchymal opacity identified in the superior segment of the lower lobes, left-greater-than-right. This could be infiltrative rather than neoplastic. Recommend short follow-up. There  were some small nodules identified on the prior. For example 3 mm nodule lateral left lower lobe subpleural on series 7, image 91 is stable. Right lower lobe subpleural nodule on the prior which measured 9 x 6 mm, today is stable on image 73 of series 7. Stable 2-3 mm nodule right lower lobe on series 7, image 97. Musculoskeletal: Curvature of the spine with some degenerative changes. CT ABDOMEN PELVIS FINDINGS Hepatobiliary: Stable dome hepatic cyst. No new space-occupying lesion in the liver. Patent portal vein. Gallbladder is distended. Pancreas: Unremarkable. No pancreatic ductal dilatation or surrounding inflammatory changes. Spleen: Spleen is slightly enlarged at 13.1 cm in AP length. Preserved enhancement. Small splenule inferiorly. Adrenals/Urinary Tract: The adrenal glands are preserved. Tiny low-attenuation cystic lesions are seen which are too small to completely characterize along the kidneys. Bosniak 2 lesions. No specific imaging follow-up. The ureters have normal course and caliber down to the bladder. Bladder wall is diffusely thickened. The bladder is contracted. There is significant stranding. This is progressive from the previous examination. Stomach/Bowel: Large bowel has a normal course and caliber with scattered stool. Overall moderate stool burden. Normal appendix extends inferior to the cecum in the right lower quadrant. The stomach and small bowel are nondilated. Vascular/Lymphatic: Normal caliber aorta and IVC with some vascular calcifications. No specific abnormal lymph node enlargement identified in the abdomen and pelvis. Reproductive: Absence of the uterus and ovaries. Other: Once again there is a pelvic soft tissue nodule anteriorly  on series 2, image 104 which on the prior measured 2.1 x 1.6 cm and today 1.9 x 1.3 cm. Smaller foci more caudal on axial image 111 and 112 are stable. Musculoskeletal: Curvature and degenerative changes are seen along the spine. Degenerative changes along  the pelvis. IMPRESSION: Stable pelvic peritoneal nodules. No new lymph node enlargement seen in the chest, abdomen or pelvis. New tiny pleural effusions, left-greater-than-right with some ill-defined parenchymal opacities along the lower lobes. Although there is a differential this could be infectious or inflammatory. Recommend short follow-up and correlation to specific symptoms. Previous tiny lung nodules are stable. Continued follow-up surveillance as per the patient's neoplasm. Mild splenomegaly. Worsening bladder wall thickening and stranding. Please correlate with symptoms and treatment Electronically Signed   By: Karen Kays M.D.   On: 03/03/2023 18:23      04/05/2023 Imaging   Normal bone density scan   06/21/2023 Imaging   CT CHEST ABDOMEN PELVIS W CONTRAST  Result Date: 06/20/2023 CLINICAL DATA:  Endometrial cancer restaging, chemotherapy complete, ongoing oral chemotherapy * Tracking Code: BO * EXAM: CT CHEST, ABDOMEN, AND PELVIS WITH CONTRAST TECHNIQUE: Multidetector CT imaging of the chest, abdomen and pelvis was performed following the standard protocol during bolus administration of intravenous contrast. RADIATION DOSE REDUCTION: This exam was performed according to the departmental dose-optimization program which includes automated exposure control, adjustment of the mA and/or kV according to patient size and/or use of iterative reconstruction technique. CONTRAST:  OMNIPAQUE IOHEXOL 300 MG/ML  SOLN COMPARISON:  CT chest angiogram, 03/23/2023, CT chest abdomen pelvis, 03/02/2023 FINDINGS: CT CHEST FINDINGS Cardiovascular: No significant vascular findings. Normal heart size. No pericardial effusion. Mediastinum/Nodes: No enlarged mediastinal, hilar, or axillary lymph nodes. Thyroid gland, trachea, and esophagus demonstrate no significant findings. Lungs/Pleura: Interval resolution of previously seen pleural effusions. Significant interval improvement of previously seen heterogeneous and  ground-glass airspace opacity, with small residual opacities present in the lower lobes (series 4, image 68). Multiple small subpleural nodules are unchanged, largest in the dependent right lower lobe measuring 0.9 cm (series 4, image 75). Musculoskeletal: No chest wall abnormality. No acute osseous findings. CT ABDOMEN PELVIS FINDINGS Hepatobiliary: No solid liver abnormality is seen. Benign simple cyst of the liver dome, requiring no further follow-up or characterization (series 2, image 45). No gallstones, gallbladder wall thickening, or biliary dilatation. Pancreas: Unremarkable. No pancreatic ductal dilatation or surrounding inflammatory changes. Spleen: Normal in size without significant abnormality. Adrenals/Urinary Tract: Adrenal glands are unremarkable. Simple, benign right renal cortical cysts, for which no further follow-up or characterization is required. Kidneys are otherwise normal, without renal calculi, solid lesion, or hydronephrosis. Bladder is unremarkable. Stomach/Bowel: Stomach is within normal limits. Appendix appears normal. No evidence of bowel wall thickening, distention, or inflammatory changes. Moderate burden of stool throughout the colon and rectum. Vascular/Lymphatic: No significant vascular findings are present. No enlarged abdominal or pelvic lymph nodes. Reproductive: Status post hysterectomy. Other: No abdominal wall hernia or abnormality. No ascites. Largest soft tissue nodule in the low midline abdomen is increased in size, measuring 2.8 x 2.5 cm, previously 2.4 x 2.1 cm (series 2, image 105). Multiple additional soft tissue nodules throughout the pelvis are unchanged, largest in the anterior right pelvis measuring 1.6 x 1.2 cm (series 2, image 112). Additional nodule at the right aspect of the vaginal cuff measures 2.0 x 1.0 cm (series 2, image 112). Musculoskeletal: No acute osseous findings. IMPRESSION: 1. Largest peritoneal soft tissue nodule in the low midline abdomen is  increased in size,  consistent with worsened peritoneal metastatic disease. Multiple additional soft tissue nodules throughout the pelvis are unchanged. 2. 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 3. Interval resolution of previously seen pleural effusions. 4. Significant interval improvement of previously seen heterogeneous and ground-glass airspace opacity, with small residual opacities present in the lower lobes. Findings are consistent with improved nonspecific infection or inflammation. 5. Status post hysterectomy. Electronically Signed   By: Jearld Lesch M.D.   On: 06/20/2023 20:49        PHYSICAL EXAMINATION: ECOG PERFORMANCE STATUS: 0 - Asymptomatic  Vitals:   08/03/23 1023  BP: 131/75  Pulse: 79  Resp: 18  Temp: 98.5 F (36.9 C)  SpO2: 98%   Filed Weights   08/03/23 1023  Weight: 184 lb 6.4 oz (83.6 kg)    GENERAL:alert, no distress and comfortable   LABORATORY DATA:  I have reviewed the data as listed    Component Value Date/Time   NA 139 07/06/2023 1345   K 4.0 07/06/2023 1345   CL 104 07/06/2023 1345   CO2 29 07/06/2023 1345   GLUCOSE 128 (H) 07/06/2023 1345   BUN 12 07/06/2023 1345   CREATININE 0.56 07/06/2023 1345   CREATININE 0.85 03/18/2023 0738   CALCIUM 9.4 07/06/2023 1345   PROT 6.7 07/06/2023 1345   ALBUMIN 3.6 07/06/2023 1345   AST 16 07/06/2023 1345   AST 17 03/18/2023 0738   ALT 13 07/06/2023 1345   ALT 9 03/18/2023 0738   ALKPHOS 96 07/06/2023 1345   BILITOT 0.3 07/06/2023 1345   BILITOT 0.4 03/18/2023 0738   GFRNONAA >60 07/06/2023 1345   GFRNONAA >60 03/18/2023 0738    No results found for: "SPEP", "UPEP"  Lab Results  Component Value Date   WBC 5.5 08/03/2023   NEUTROABS 1.8 08/03/2023   HGB 13.2 08/03/2023   HCT 38.9 08/03/2023   MCV 84.4 08/03/2023   PLT 219 08/03/2023      Chemistry      Component Value Date/Time   NA 139 07/06/2023 1345   K 4.0 07/06/2023 1345   CL 104 07/06/2023 1345    CO2 29 07/06/2023 1345   BUN 12 07/06/2023 1345   CREATININE 0.56 07/06/2023 1345   CREATININE 0.85 03/18/2023 0738      Component Value Date/Time   CALCIUM 9.4 07/06/2023 1345   ALKPHOS 96 07/06/2023 1345   AST 16 07/06/2023 1345   AST 17 03/18/2023 0738   ALT 13 07/06/2023 1345   ALT 9 03/18/2023 0738   BILITOT 0.3 07/06/2023 1345   BILITOT 0.4 03/18/2023 0738

## 2023-08-03 NOTE — Assessment & Plan Note (Signed)
She has minor hot flashes but otherwise asymptomatic from side effects of treatment According to the patient, this is tolerable

## 2023-08-30 ENCOUNTER — Other Ambulatory Visit: Payer: Self-pay

## 2023-08-31 ENCOUNTER — Inpatient Hospital Stay: Payer: Managed Care, Other (non HMO) | Attending: Gynecologic Oncology

## 2023-08-31 ENCOUNTER — Inpatient Hospital Stay: Payer: Managed Care, Other (non HMO)

## 2023-08-31 ENCOUNTER — Encounter: Payer: Self-pay | Admitting: Hematology and Oncology

## 2023-08-31 ENCOUNTER — Telehealth: Payer: Self-pay | Admitting: Hematology and Oncology

## 2023-08-31 ENCOUNTER — Inpatient Hospital Stay: Payer: Managed Care, Other (non HMO) | Attending: Gynecologic Oncology | Admitting: Hematology and Oncology

## 2023-08-31 VITALS — BP 149/78 | HR 81 | Temp 97.8°F | Resp 18 | Ht 67.0 in | Wt 191.6 lb

## 2023-08-31 DIAGNOSIS — Z79810 Long term (current) use of selective estrogen receptor modulators (SERMs): Secondary | ICD-10-CM | POA: Diagnosis not present

## 2023-08-31 DIAGNOSIS — Z170 Estrogen receptor positive status [ER+]: Secondary | ICD-10-CM | POA: Insufficient documentation

## 2023-08-31 DIAGNOSIS — C786 Secondary malignant neoplasm of retroperitoneum and peritoneum: Secondary | ICD-10-CM | POA: Insufficient documentation

## 2023-08-31 DIAGNOSIS — C549 Malignant neoplasm of corpus uteri, unspecified: Secondary | ICD-10-CM

## 2023-08-31 DIAGNOSIS — C7802 Secondary malignant neoplasm of left lung: Secondary | ICD-10-CM | POA: Insufficient documentation

## 2023-08-31 DIAGNOSIS — C7801 Secondary malignant neoplasm of right lung: Secondary | ICD-10-CM | POA: Diagnosis not present

## 2023-08-31 DIAGNOSIS — Z1721 Progesterone receptor positive status: Secondary | ICD-10-CM | POA: Insufficient documentation

## 2023-08-31 DIAGNOSIS — C55 Malignant neoplasm of uterus, part unspecified: Secondary | ICD-10-CM | POA: Insufficient documentation

## 2023-08-31 DIAGNOSIS — M8580 Other specified disorders of bone density and structure, unspecified site: Secondary | ICD-10-CM

## 2023-08-31 LAB — COMPREHENSIVE METABOLIC PANEL
ALT: 13 U/L (ref 0–44)
AST: 17 U/L (ref 15–41)
Albumin: 3.5 g/dL (ref 3.5–5.0)
Alkaline Phosphatase: 99 U/L (ref 38–126)
Anion gap: 5 (ref 5–15)
BUN: 14 mg/dL (ref 8–23)
CO2: 27 mmol/L (ref 22–32)
Calcium: 9.2 mg/dL (ref 8.9–10.3)
Chloride: 106 mmol/L (ref 98–111)
Creatinine, Ser: 0.6 mg/dL (ref 0.44–1.00)
GFR, Estimated: >60 mL/min (ref >60)
Glucose, Bld: 116 mg/dL — ABNORMAL HIGH (ref 70–99)
Potassium: 4 mmol/L (ref 3.5–5.1)
Sodium: 138 mmol/L (ref 135–145)
Total Bilirubin: 0.4 mg/dL (ref 0.3–1.2)
Total Protein: 6.3 g/dL — ABNORMAL LOW (ref 6.5–8.1)

## 2023-08-31 LAB — CBC WITH DIFFERENTIAL/PLATELET
Abs Immature Granulocytes: 0.01 10*3/uL (ref 0.00–0.07)
Basophils Absolute: 0.1 10*3/uL (ref 0.0–0.1)
Basophils Relative: 1 %
Eosinophils Absolute: 0.7 10*3/uL — ABNORMAL HIGH (ref 0.0–0.5)
Eosinophils Relative: 11 %
HCT: 37.6 % (ref 36.0–46.0)
Hemoglobin: 12.7 g/dL (ref 12.0–15.0)
Immature Granulocytes: 0 %
Lymphocytes Relative: 33 %
Lymphs Abs: 1.9 10*3/uL (ref 0.7–4.0)
MCH: 29.7 pg (ref 26.0–34.0)
MCHC: 33.8 g/dL (ref 30.0–36.0)
MCV: 88.1 fL (ref 80.0–100.0)
Monocytes Absolute: 0.6 10*3/uL (ref 0.1–1.0)
Monocytes Relative: 10 %
Neutro Abs: 2.6 10*3/uL (ref 1.7–7.7)
Neutrophils Relative %: 45 %
Platelets: 216 10*3/uL (ref 150–400)
RBC: 4.27 MIL/uL (ref 3.87–5.11)
RDW: 15.8 % — ABNORMAL HIGH (ref 11.5–15.5)
WBC: 5.9 10*3/uL (ref 4.0–10.5)
nRBC: 0 % (ref 0.0–0.2)

## 2023-08-31 MED ORDER — SODIUM CHLORIDE 0.9% FLUSH
10.0000 mL | Freq: Once | INTRAVENOUS | Status: AC
  Administered 2023-08-31: 10 mL

## 2023-08-31 MED ORDER — HEPARIN SOD (PORK) LOCK FLUSH 100 UNIT/ML IV SOLN
500.0000 [IU] | Freq: Once | INTRAVENOUS | Status: AC
  Administered 2023-08-31: 500 [IU]

## 2023-08-31 MED ORDER — LEUPROLIDE ACETATE 3.75 MG IM KIT
3.7500 mg | PACK | INTRAMUSCULAR | Status: DC
  Administered 2023-08-31: 3.75 mg via INTRAMUSCULAR
  Filled 2023-08-31: qty 3.75

## 2023-08-31 NOTE — Telephone Encounter (Signed)
Per Bertis Ruddy 10/15 LOS patient is aware of scheduled appointment times/dates for follow up appointment

## 2023-08-31 NOTE — Progress Notes (Signed)
[facility] OFFICE PROGRESS NOTE  Patient Care Team: [name], Ananias Pilgrim, MD as PCP - General (Family Medicine) Mallipeddi, Orion Modest, MD as PCP - Cardiology (Cardiology) Carver Fila, MD as Consulting Physician (Gynecologic Oncology) Maryclare Labrador, RN as Registered Nurse  HISTORY OF PRESENTING ILLNESS: Discussed the use of AI scribe software for clinical note transcription with the patient, who gave verbal consent to proceed.  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 She has experienced hot flashes, but denies any chest pain, shortness of breath, or cough. The patient is also on Tamoxifen, with three weeks of medication remaining.  The patient has a port for IV access and expresses a preference for using it for upcoming procedures.  The patient also mentions a 'bad week' at work coming up, indicating that her medical condition and treatment may be impacting her work schedule. She is actively involved in scheduling her appointments around her work commitments.         Assessment and Plan    Uterine sarcoma Third injection of current treatment regimen completed. No new side effects reported apart from hot flashes. No chest symptoms. Plan to assess efficacy of current treatment with imaging. -Schedule scan for 09/14/2023. -Review scan results on 09/24/2023. -If treatment is ineffective, plan to switch back to chemotherapy.  Tamoxifen Patient has three weeks of medication left. -Do not refill prescription yet. Review medication needs at next appointment after scan results. Tolerating hot flashes  General Health Maintenance -Continue current treatment regimen. -Next treatment scheduled for 09/28/2023.          Orders Placed This Encounter  Procedures   CT CHEST ABDOMEN PELVIS W CONTRAST    Standing Status:   Future    Standing Expiration  Date:   08/30/2024    Scheduling Instructions:     No need oral contrast    Order Specific Question:   If indicated for the ordered procedure, I authorize the administration of contrast media per Radiology protocol    Answer:   Yes    Order Specific Question:   Does the patient have a contrast media/X-ray dye allergy?    Answer:   No    Order Specific Question:   Preferred imaging location?    Answer:   [facility]    Order Specific Question:   If indicated for the ordered procedure, I authorize the administration of oral contrast media per Radiology protocol    Answer:   Yes    All questions were answered. The patient knows to call the clinic with any problems, questions or concerns. The total time spent in the appointment was 25 minutes encounter with patients including review of chart and various tests results, discussions about plan of care and coordination of care plan   Artis Delay, MD 08/31/2023 8:50 AM  REVIEW OF SYSTEMS:  All other systems were reviewed with the patient and are negative.  I have reviewed the past medical history, past surgical history, social history and family history with the patient and they are unchanged from previous note.  ALLERGIES:  has No Known Allergies.  MEDICATIONS:  Current Outpatient Medications  Medication Sig Dispense Refill   acetaminophen (TYLENOL) 500 MG tablet Take 500 mg by mouth every 6 (six) hours as needed for mild pain, moderate pain or fever.     aspirin 81 MG EC tablet Take by mouth daily.  Cholecalciferol (VITAMIN D3 PO) Take 1 tablet by mouth daily.     furosemide (LASIX) 20 MG tablet Take 20 mg by mouth as needed for fluid or edema. Only takes 2-3 days per week     Leuprolide Acetate (LUPRON IJ) Inject as directed every 28 (twenty-eight) days.     lidocaine-prilocaine (EMLA) cream Apply 1 Application topically as needed. 30 g 3   loratadine (CLARITIN) 10 MG tablet Take 10 mg by mouth daily.     LORazepam (ATIVAN) 0.5  MG tablet Take 1 tablet (0.5 mg total) by mouth 2 (two) times daily as needed for anxiety. 10 tablet 0   losartan (COZAAR) 50 MG tablet Take 1 tablet (50 mg total) by mouth daily. 90 tablet 1   metoprolol tartrate (LOPRESSOR) 25 MG tablet Take 1 tablet (25 mg total) by mouth 2 (two) times daily. 180 tablet 1   Multiple Vitamin (MULTIVITAMIN WITH MINERALS) TABS tablet Take 1 tablet by mouth daily. 30 tablet 1   omeprazole (PRILOSEC) 20 MG capsule Take 1 capsule (20 mg total) by mouth 2 (two) times daily. 28 capsule 0   ondansetron (ZOFRAN) 8 MG tablet Take 1 tablet (8 mg total) by mouth every 8 (eight) hours as needed for nausea. 30 tablet 3   tamoxifen (NOLVADEX) 20 MG tablet Take 1 tablet (20 mg total) by mouth daily. 30 tablet 3   No current facility-administered medications for this visit.   Facility-Administered Medications Ordered in Other Visits  Medication Dose Route Frequency Provider Last Rate Last Admin   leuprolide (LUPRON) injection 3.75 mg  3.75 mg Intramuscular Q28 days Artis Delay, MD   3.75 mg at 08/31/23 0809    SUMMARY OF ONCOLOGIC HISTORY: Oncology History Overview Note  High grade and LMS, 50% ER/PR positive dMMR normal, PD-L1 CPS 3%   Uterine sarcoma (HCC)  06/02/2021 Imaging   MRI pelvis Heterogeneous 9.0 cm intrauterine mass within the intramural/submucosal anterior fundus demonstrating interval growth, internal enhancement, and restricted diffusion 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 No extra uterine extension. No evidence of metastatic disease within the pelvis.   Mild thickening of the endometrial stripe, likely related to entrapment by the adjacent uterine mass   06/03/2021 Initial Diagnosis   Uterine sarcoma (HCC)   06/03/2021 Cancer Staging   Staging form: Corpus Uteri - Sarcoma, AJCC 7th Edition - Clinical stage from 06/03/2021: FIGO Stage IVB (rT1b, N0, M1) - Signed by Artis Delay, MD on 03/31/2022 Diagnostic  confirmation: Positive histology Stage prefix: Recurrence Biopsy of metastatic site performed: No Lymph-vascular invasion (LVI): LVI present/identified, NOS   06/03/2021 Pathology Results   FINAL MICROSCOPIC DIAGNOSIS:   A. UTERUS, CERVIX, BILATERAL FALLOPIAN TUBES AND OVARIES, HYSTERECTOMY:  - Uterus:       Mixed high grade uterine sarcoma, spanning 6 cm, see comment.       Extensive lymphovascular invasion.       See oncology table.  - Cervix: Benign squamous and endocervical mucosa. No dysplasia or  malignancy.  - Bilateral ovaries: Unremarkable. No malignancy.  - Bilateral fallopian tubes: Unremarkable. No malignancy.   ONCOLOGY TABLE:   UTERUS, SARCOMA: Resection   Procedure: Total hysterectomy and bilateral salpingo-oophorectomy  Specimen Integrity: Focally disrupted on posterior surface  Tumor Site: Anterior wall  Tumor Size: 6 cm  Histologic Type: High grade sarcoma, mixed. See comment.  Other Tissue/ Organ Involvement: Not identified  Lymphovascular Invasion: Present, extensive.  Margins: All margins negative for tumor  Regional Lymph Nodes: Not applicable (no lymph  nodes submitted or found)  Distant Metastasis:       Distant Site(s) Involved: Not applicable  Pathologic Stage Classification (pTNM, AJCC 8th Edition): pT1b, pN not  assigned  Ancillary Studies: Can be performed upon request  Representative Tumor Block: A6  Comment(s): The tumor has two morphologically different components. One component consists of malignant spindle cells which can be seen arising from the smooth muscle. These foci are positive for SMA, desmin, cyclinD1 (focal), and CD10 (patchy). The other component consists of round to slightly spindle cells with abundant admixed vessels and occasional large pleomorphic cells. These areas are positive for SMA (patchy weak), desmin (focal), CD10 (variable with diffuse areas). Pancytokeratin is negative. 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   ADDENDUM:   PROGNOSTIC INDICATOR RESULTS:   Immunohistochemical and morphometric analysis performed manually    Estrogen Receptor:       POSITIVE, 50%, WEAK TO MODERATE STAINING  Progesterone Receptor:   POSITIVE, 50%, MODERATE TO STRONG STAINING   Reference Range Estrogen and Progesterone Receptor       Negative  0%       Positive  >1%    06/03/2021 Surgery   Surgeon: Quinn Axe    Assistants: Dr Antionette Char (an MD assistant was necessary for tissue manipulation, management of robotic instrumentation, retraction and positioning due to the complexity of the case and hospital policies).  Operation: Robotic-assisted laparoscopic total hysterectomy >250gm with bilateral salpingoophorectomy, minilaparotomy for specimen delivery   Surgeon: Quinn Axe    Operative Findings:  : Bulky 16cm uterus with intra-uterine mass (consistent with a fibroid-like mass) , smooth normal appearing serosa, no suspicious bulky nodes. Normal ovaries bilaterally. Normal upper abdomen. Uterus too large to deliver vaginally in tact.    06/18/2021 Imaging   1. Multiple bilateral pulmonary nodules, measuring up to 9 mm. Most of these are perifissural and subpleural in location, likely lymph nodes. Nevertheless, close follow-up recommended to exclude metastatic disease. 2. 11 mm hypoattenuating lesion towards the dome of the liver, indeterminate. MRI abdomen with and without contrast could be used to further evaluate as clinically warranted. 3. Aortic Atherosclerosis (ICD10-I70.0).   10/13/2021 Imaging   1. No acute findings within the abdomen or pelvis. No specific findings identified to suggest residual or recurrence of tumor. 2. Stable small pulmonary nodules within the left lower lobe.   03/18/2022 Imaging   1. Interval development of 2 soft tissue nodules in the anterior pelvis measuring 10 mm and 7 mm respectively. Close  follow-up recommended to exclude metastatic disease. PET-CT may be warranted to further evaluate. 2. Development of 2 mm left upper lobe parenchymal nodule, nonspecific. Close attention on follow-up imaging recommended  3. Stable appearance of index bilateral pulmonary nodules identified previously. 4. Aortic Atherosclerosis (ICD10-I70.0).   04/02/2022 PET scan   1. Signs of local tumor recurrence noted at the vaginal cuff. 2. 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 New since 10/13/2021 and progressive when compared with 03/17/2022. 3. New focal area of increased uptake within the right biceps femoris muscle which is suspicious for skeletal muscle involvement. 4. Stable appearance of small pulmonary nodules described on 03/17/2022. These are too small to characterize by PET-CT.   04/09/2022 Procedure   Successful placement of a right IJ approach Power Port with ultrasound and fluoroscopic guidance. The catheter is ready for use.   04/09/2022 Echocardiogram    1. Left ventricular ejection fraction, by estimation,  is 65 to 70%. The left ventricle has normal function. The left ventricle has no regional wall motion abnormalities. There is mild concentric left ventricular hypertrophy. Left ventricular diastolic parameters are consistent with Grade I diastolic dysfunction (impaired relaxation).  2. Right ventricular systolic function is normal. The right ventricular size is normal.  3. The mitral valve is normal in structure. No evidence of mitral valve regurgitation.  4. The aortic valve is tricuspid. Aortic valve regurgitation is not visualized. No aortic stenosis is present.  5. The inferior vena cava is normal in size with greater than 50% respiratory variability, suggesting right atrial pressure of 3 mmHg.   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  Status post hysterectomy and bilateral oophorectomy. Multiple newand enlarged peritoneal nodules throughout the low abdomen and pelvis, previously FDG avid and consistent with worsened locally recurrent and peritoneal metastatic disease. 2. Multiple small bilateral pulmonary nodules, several of which are slightly enlarged compared to prior examination, consistent with worsened pulmonary metastatic disease.   Aortic Atherosclerosis (ICD10-I70.0).     06/25/2022 - 07/06/2022 Chemotherapy   Patient is on Treatment Plan : UTERINE UNDIFFERENTIATED / LEIOMYOSARCOMA Gemcitabine D1,8 + Docetaxel D8 (900/100) q21d     06/25/2022 - 03/04/2023 Chemotherapy   Patient is on Treatment Plan : UTERINE UNDIFFERENTIATED LEIOMYOSARCOMA Gemcitabine D1,8 + Docetaxel D8 (900/100) q21d     09/02/2022 Imaging   1. Moderate response to therapy of peritoneal metastasis within the anterior pelvis. 2. Minimal response to therapy of pulmonary metastasis. 3. No new or progressive disease. 4.  Aortic Atherosclerosis (ICD10-I70.0).     12/01/2022 Imaging   1. No new or progressive findings in the chest, abdomen, or pelvis. 2. Peritoneal metastases in the pelvis described previously measure smaller today compatible with interval response to therapy. 3. Stable tiny bilateral pulmonary nodules. Previously characterized as metastatic lesions, these are unchanged in the interval. No new suspicious pulmonary nodule or mass. 4.  Aortic Atherosclerois (ICD10-170.0)   03/04/2023 Imaging   CT CHEST ABDOMEN PELVIS W CONTRAST  Result Date: 03/03/2023 CLINICAL DATA:  Cervical cancer * Tracking Code: BO *. Assess treatment response. Current chemotherapy. Prior total hysterectomy EXAM: CT CHEST, ABDOMEN, AND PELVIS WITH CONTRAST TECHNIQUE: Multidetector CT imaging of the chest, abdomen and pelvis was performed following the standard protocol during bolus administration of intravenous contrast. RADIATION DOSE REDUCTION: This exam was  performed according to the departmental dose-optimization program which includes automated exposure control, adjustment of the mA and/or kV according to patient size and/or use of iterative reconstruction technique. CONTRAST:  OMNIPAQUE IOHEXOL 300 MG/ML  SOLN COMPARISON:  CT 12/01/2022 and older FINDINGS: CT CHEST FINDINGS Cardiovascular: Right upper chest port. Heart is nonenlarged. Trace pericardial fluid. Normal caliber thoracic aorta. Mediastinum/Nodes: No specific abnormal lymph node enlargement seen in the axillary region, hilum or mediastinum. Mildly patulous thoracic esophagus. Thyroid gland is unremarkable. Lungs/Pleura: Tiny pleural effusions are seen, left-greater-than-right. These new from previous. There is some patchy parenchymal opacity identified in the superior segment of the lower lobes, left-greater-than-right. This could be infiltrative rather than neoplastic. Recommend short follow-up. There were some small nodules identified on the prior. For example 3 mm nodule lateral left lower lobe subpleural on series 7, image 91 is stable. Right lower lobe subpleural nodule on the prior which measured 9 x 6 mm, today is stable on image 73 of series 7. Stable 2-3 mm  nodule right lower lobe on series 7, image 97. Musculoskeletal: Curvature of the spine with some degenerative changes. CT ABDOMEN PELVIS FINDINGS Hepatobiliary: Stable dome hepatic cyst. No new space-occupying lesion in the liver. Patent portal vein. Gallbladder is distended. Pancreas: Unremarkable. No pancreatic ductal dilatation or surrounding inflammatory changes. Spleen: Spleen is slightly enlarged at 13.1 cm in AP length. Preserved enhancement. Small splenule inferiorly. Adrenals/Urinary Tract: The adrenal glands are preserved. Tiny low-attenuation cystic lesions are seen which are too small to completely characterize along the kidneys. Bosniak 2 lesions. No specific imaging follow-up. The ureters have normal course and caliber down  to the bladder. Bladder wall is diffusely thickened. The bladder is contracted. There is significant stranding. This is progressive from the previous examination. Stomach/Bowel: Large bowel has a normal course and caliber with scattered stool. Overall moderate stool burden. Normal appendix extends inferior to the cecum in the right lower quadrant. The stomach and small bowel are nondilated. Vascular/Lymphatic: Normal caliber aorta and IVC with some vascular calcifications. No specific abnormal lymph node enlargement identified in the abdomen and pelvis. Reproductive: Absence of the uterus and ovaries. Other: Once again there is a pelvic soft tissue nodule anteriorly on series 2, image 104 which on the prior measured 2.1 x 1.6 cm and today 1.9 x 1.3 cm. Smaller foci more caudal on axial image 111 and 112 are stable. Musculoskeletal: Curvature and degenerative changes are seen along the spine. Degenerative changes along the pelvis. IMPRESSION: Stable pelvic peritoneal nodules. No new lymph node enlargement seen in the chest, abdomen or pelvis. New tiny pleural effusions, left-greater-than-right with some ill-defined parenchymal opacities along the lower lobes. Although there is a differential this could be infectious or inflammatory. Recommend short follow-up and correlation to specific symptoms. Previous tiny lung nodules are stable. Continued follow-up surveillance as per the patient's neoplasm. Mild splenomegaly. Worsening bladder wall thickening and stranding. Please correlate with symptoms and treatment Electronically Signed   By: Karen Kays M.D.   On: 03/03/2023 18:23      04/05/2023 Imaging   Normal bone density scan   06/21/2023 Imaging   CT CHEST ABDOMEN PELVIS W CONTRAST  Result Date: 06/20/2023 CLINICAL DATA:  Endometrial cancer restaging, chemotherapy complete, ongoing oral chemotherapy * Tracking Code: BO * EXAM: CT CHEST, ABDOMEN, AND PELVIS WITH CONTRAST TECHNIQUE: Multidetector CT imaging of the  chest, abdomen and pelvis was performed following the standard protocol during bolus administration of intravenous contrast. RADIATION DOSE REDUCTION: This exam was performed according to the departmental dose-optimization program which includes automated exposure control, adjustment of the mA and/or kV according to patient size and/or use of iterative reconstruction technique. CONTRAST:  OMNIPAQUE IOHEXOL 300 MG/ML  SOLN COMPARISON:  CT chest angiogram, 03/23/2023, CT chest abdomen pelvis, 03/02/2023 FINDINGS: CT CHEST FINDINGS Cardiovascular: No significant vascular findings. Normal heart size. No pericardial effusion. Mediastinum/Nodes: No enlarged mediastinal, hilar, or axillary lymph nodes. Thyroid gland, trachea, and esophagus demonstrate no significant findings. Lungs/Pleura: Interval resolution of previously seen pleural effusions. Significant interval improvement of previously seen heterogeneous and ground-glass airspace opacity, with small residual opacities present in the lower lobes (series 4, image 68). Multiple small subpleural nodules are unchanged, largest in the dependent right lower lobe measuring 0.9 cm (series 4, image 75). Musculoskeletal: No chest wall abnormality. No acute osseous findings. CT ABDOMEN PELVIS FINDINGS Hepatobiliary: No solid liver abnormality is seen. Benign simple cyst of the liver dome, requiring no further follow-up or characterization (series 2, image 45). No gallstones, gallbladder wall thickening, or  biliary dilatation. Pancreas: Unremarkable. No pancreatic ductal dilatation or surrounding inflammatory changes. Spleen: Normal in size without significant abnormality. Adrenals/Urinary Tract: Adrenal glands are unremarkable. Simple, benign right renal cortical cysts, for which no further follow-up or characterization is required. Kidneys are otherwise normal, without renal calculi, solid lesion, or hydronephrosis. Bladder is unremarkable. Stomach/Bowel: Stomach is  within normal limits. Appendix appears normal. No evidence of bowel wall thickening, distention, or inflammatory changes. Moderate burden of stool throughout the colon and rectum. Vascular/Lymphatic: No significant vascular findings are present. No enlarged abdominal or pelvic lymph nodes. Reproductive: Status post hysterectomy. Other: No abdominal wall hernia or abnormality. No ascites. Largest soft tissue nodule in the low midline abdomen is increased in size, measuring 2.8 x 2.5 cm, previously 2.4 x 2.1 cm (series 2, image 105). Multiple additional soft tissue nodules throughout the pelvis are unchanged, largest in the anterior right pelvis measuring 1.6 x 1.2 cm (series 2, image 112). Additional nodule at the right aspect of the vaginal cuff measures 2.0 x 1.0 cm (series 2, image 112). Musculoskeletal: No acute osseous findings. IMPRESSION: 1. Largest peritoneal soft tissue nodule in the low midline abdomen is increased in size, consistent with worsened peritoneal metastatic disease. Multiple additional soft tissue nodules throughout the pelvis are unchanged. 2. 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 3. Interval resolution of previously seen pleural effusions. 4. Significant interval improvement of previously seen heterogeneous and ground-glass airspace opacity, with small residual opacities present in the lower lobes. Findings are consistent with improved nonspecific infection or inflammation. 5. Status post hysterectomy. Electronically Signed   By: Jearld Lesch M.D.   On: 06/20/2023 20:49        PHYSICAL EXAMINATION: ECOG PERFORMANCE STATUS: 0 - Asymptomatic  Vitals:   08/31/23 0757  BP: (!) 149/78  Pulse: 81  Resp: 18  Temp: 97.8 F (36.6 C)  SpO2: 99%   Filed Weights   08/31/23 0757  Weight: 191 lb 9.6 oz (86.9 kg)    GENERAL:alert, no distress and comfortable  LABORATORY DATA:  I have reviewed the data as listed    Component Value Date/Time   NA  138 08/31/2023 0729   K 4.0 08/31/2023 0729   CL 106 08/31/2023 0729   CO2 27 08/31/2023 0729   GLUCOSE 116 (H) 08/31/2023 0729   BUN 14 08/31/2023 0729   CREATININE 0.60 08/31/2023 0729   CREATININE 0.85 03/18/2023 0738   CALCIUM 9.2 08/31/2023 0729   PROT 6.3 (L) 08/31/2023 0729   ALBUMIN 3.5 08/31/2023 0729   AST 17 08/31/2023 0729   AST 17 03/18/2023 0738   ALT 13 08/31/2023 0729   ALT 9 03/18/2023 0738   ALKPHOS 99 08/31/2023 0729   BILITOT 0.4 08/31/2023 0729   BILITOT 0.4 03/18/2023 0738   GFRNONAA >60 08/31/2023 0729   GFRNONAA >60 03/18/2023 0738    No results found for: "SPEP", "UPEP"  Lab Results  Component Value Date   WBC 5.9 08/31/2023   NEUTROABS 2.6 08/31/2023   HGB 12.7 08/31/2023   HCT 37.6 08/31/2023   MCV 88.1 08/31/2023   PLT 216 08/31/2023      Chemistry      Component Value Date/Time   NA 138 08/31/2023 0729   K 4.0 08/31/2023 0729   CL 106 08/31/2023 0729   CO2 27 08/31/2023 0729   BUN 14 08/31/2023 0729   CREATININE 0.60 08/31/2023 0729   CREATININE 0.85 03/18/2023 0738      Component Value Date/Time   CALCIUM  9.2 08/31/2023 0729   ALKPHOS 99 08/31/2023 0729   AST 17 08/31/2023 0729   AST 17 03/18/2023 0738   ALT 13 08/31/2023 0729   ALT 9 03/18/2023 0738   BILITOT 0.4 08/31/2023 0729   BILITOT 0.4 03/18/2023 0738       RADIOGRAPHIC STUDIES: I have personally reviewed the radiological images as listed and agreed with the findings in the report. No results found.

## 2023-09-14 ENCOUNTER — Ambulatory Visit (HOSPITAL_COMMUNITY): Payer: Managed Care, Other (non HMO)

## 2023-09-14 ENCOUNTER — Other Ambulatory Visit: Payer: Managed Care, Other (non HMO)

## 2023-09-14 ENCOUNTER — Ambulatory Visit: Admission: RE | Admit: 2023-09-14 | Payer: Managed Care, Other (non HMO) | Source: Ambulatory Visit

## 2023-09-17 ENCOUNTER — Inpatient Hospital Stay: Payer: Managed Care, Other (non HMO) | Attending: Gynecologic Oncology

## 2023-09-17 ENCOUNTER — Ambulatory Visit (HOSPITAL_COMMUNITY)
Admission: RE | Admit: 2023-09-17 | Discharge: 2023-09-17 | Disposition: A | Discharge status: Home / Self Care | Payer: Managed Care, Other (non HMO) | Source: Ambulatory Visit | Attending: Hematology and Oncology | Admitting: Hematology and Oncology

## 2023-09-17 ENCOUNTER — Encounter (HOSPITAL_COMMUNITY): Payer: Self-pay

## 2023-09-17 DIAGNOSIS — C549 Malignant neoplasm of corpus uteri, unspecified: Secondary | ICD-10-CM

## 2023-09-17 DIAGNOSIS — C786 Secondary malignant neoplasm of retroperitoneum and peritoneum: Secondary | ICD-10-CM | POA: Insufficient documentation

## 2023-09-17 DIAGNOSIS — Z5111 Encounter for antineoplastic chemotherapy: Secondary | ICD-10-CM | POA: Insufficient documentation

## 2023-09-17 DIAGNOSIS — Z90722 Acquired absence of ovaries, bilateral: Secondary | ICD-10-CM | POA: Insufficient documentation

## 2023-09-17 DIAGNOSIS — Z90710 Acquired absence of both cervix and uterus: Secondary | ICD-10-CM | POA: Insufficient documentation

## 2023-09-17 DIAGNOSIS — C55 Malignant neoplasm of uterus, part unspecified: Secondary | ICD-10-CM | POA: Insufficient documentation

## 2023-09-17 DIAGNOSIS — M8580 Other specified disorders of bone density and structure, unspecified site: Secondary | ICD-10-CM

## 2023-09-17 DIAGNOSIS — C7982 Secondary malignant neoplasm of genital organs: Secondary | ICD-10-CM | POA: Insufficient documentation

## 2023-09-17 DIAGNOSIS — C7800 Secondary malignant neoplasm of unspecified lung: Secondary | ICD-10-CM | POA: Insufficient documentation

## 2023-09-17 LAB — CBC WITH DIFFERENTIAL/PLATELET
Abs Immature Granulocytes: 0.02 10*3/uL (ref 0.00–0.07)
Basophils Absolute: 0.1 10*3/uL (ref 0.0–0.1)
Basophils Relative: 1 %
Eosinophils Absolute: 0.5 10*3/uL (ref 0.0–0.5)
Eosinophils Relative: 5 %
HCT: 36.7 % (ref 36.0–46.0)
Hemoglobin: 12.6 g/dL (ref 12.0–15.0)
Immature Granulocytes: 0 %
Lymphocytes Relative: 22 %
Lymphs Abs: 2.2 10*3/uL (ref 0.7–4.0)
MCH: 30.8 pg (ref 26.0–34.0)
MCHC: 34.3 g/dL (ref 30.0–36.0)
MCV: 89.7 fL (ref 80.0–100.0)
Monocytes Absolute: 0.9 10*3/uL (ref 0.1–1.0)
Monocytes Relative: 9 %
Neutro Abs: 6.3 10*3/uL (ref 1.7–7.7)
Neutrophils Relative %: 63 %
Platelets: 212 10*3/uL (ref 150–400)
RBC: 4.09 MIL/uL (ref 3.87–5.11)
RDW: 14.3 % (ref 11.5–15.5)
WBC: 9.9 10*3/uL (ref 4.0–10.5)
nRBC: 0 % (ref 0.0–0.2)

## 2023-09-17 LAB — COMPREHENSIVE METABOLIC PANEL
ALT: 11 U/L (ref 0–44)
AST: 13 U/L — ABNORMAL LOW (ref 15–41)
Albumin: 3.5 g/dL (ref 3.5–5.0)
Alkaline Phosphatase: 83 U/L (ref 38–126)
Anion gap: 6 (ref 5–15)
BUN: 11 mg/dL (ref 8–23)
CO2: 28 mmol/L (ref 22–32)
Calcium: 8.8 mg/dL — ABNORMAL LOW (ref 8.9–10.3)
Chloride: 104 mmol/L (ref 98–111)
Creatinine, Ser: 0.55 mg/dL (ref 0.44–1.00)
GFR, Estimated: >60 mL/min (ref >60)
Glucose, Bld: 112 mg/dL — ABNORMAL HIGH (ref 70–99)
Potassium: 4 mmol/L (ref 3.5–5.1)
Sodium: 138 mmol/L (ref 135–145)
Total Bilirubin: 0.7 mg/dL (ref 0.3–1.2)
Total Protein: 6.7 g/dL (ref 6.5–8.1)

## 2023-09-17 MED ORDER — IOHEXOL 300 MG/ML  SOLN
100.0000 mL | Freq: Once | INTRAMUSCULAR | Status: AC | PRN
  Administered 2023-09-17: 100 mL via INTRAVENOUS

## 2023-09-17 MED ORDER — HEPARIN SOD (PORK) LOCK FLUSH 100 UNIT/ML IV SOLN
500.0000 [IU] | Freq: Once | INTRAVENOUS | Status: AC
  Administered 2023-09-17: 500 [IU] via INTRAVENOUS

## 2023-09-17 MED ORDER — SODIUM CHLORIDE 0.9% FLUSH
10.0000 mL | Freq: Once | INTRAVENOUS | Status: AC
  Administered 2023-09-17: 10 mL

## 2023-09-20 ENCOUNTER — Telehealth: Payer: Self-pay | Admitting: Oncology

## 2023-09-20 NOTE — Telephone Encounter (Signed)
Called Leah Diaz and rescheduled her appointment with Dr. Bertis Ruddy to 09/22/23 to discuss her CT scan.

## 2023-09-22 ENCOUNTER — Encounter: Payer: Self-pay | Admitting: Hematology and Oncology

## 2023-09-22 ENCOUNTER — Inpatient Hospital Stay (HOSPITAL_BASED_OUTPATIENT_CLINIC_OR_DEPARTMENT_OTHER): Payer: Managed Care, Other (non HMO) | Admitting: Hematology and Oncology

## 2023-09-22 ENCOUNTER — Other Ambulatory Visit: Payer: Self-pay | Admitting: Hematology and Oncology

## 2023-09-22 VITALS — BP 150/88 | HR 95 | Temp 98.0°F | Resp 18 | Ht 67.0 in | Wt 189.4 lb

## 2023-09-22 DIAGNOSIS — Z90710 Acquired absence of both cervix and uterus: Secondary | ICD-10-CM | POA: Insufficient documentation

## 2023-09-22 DIAGNOSIS — C55 Malignant neoplasm of uterus, part unspecified: Secondary | ICD-10-CM | POA: Diagnosis present

## 2023-09-22 DIAGNOSIS — C7982 Secondary malignant neoplasm of genital organs: Secondary | ICD-10-CM | POA: Insufficient documentation

## 2023-09-22 DIAGNOSIS — C549 Malignant neoplasm of corpus uteri, unspecified: Secondary | ICD-10-CM

## 2023-09-22 DIAGNOSIS — C7800 Secondary malignant neoplasm of unspecified lung: Secondary | ICD-10-CM | POA: Diagnosis not present

## 2023-09-22 DIAGNOSIS — Z5111 Encounter for antineoplastic chemotherapy: Secondary | ICD-10-CM | POA: Diagnosis present

## 2023-09-22 DIAGNOSIS — C786 Secondary malignant neoplasm of retroperitoneum and peritoneum: Secondary | ICD-10-CM | POA: Diagnosis not present

## 2023-09-22 DIAGNOSIS — Z90722 Acquired absence of ovaries, bilateral: Secondary | ICD-10-CM | POA: Diagnosis not present

## 2023-09-22 NOTE — Progress Notes (Signed)
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  PD-L1 is only 3% positive.  She failed treatment with doxorubicin.  Despite tumor being 50% ER/PR positive, she has failed treatment with tamoxifen and Lupron. I explained to the patient why surgical intervention is not indicated.   We discussed risk and benefits of resumption of chemotherapy with gemcitabine with docetaxel versus Trabectedin At the end of the discussion, she is undecided We also discussed potential referral to other institution for second opinion  She will call me by the end of the week with her final decision     No orders of the defined types were placed in this encounter.   All questions were answered. The patient knows to call the clinic with any problems, questions or concerns. The total time spent in the appointment was 55 minutes encounter with patients including review of chart and various tests results, discussions about plan of care and coordination of care plan   Artis Delay, MD 09/22/2023 5:58 PM  INTERVAL HISTORY: Please see below for problem oriented charting. she returns for review test results We discussed recent CT imaging findings Over the past 7 days, she have some mild intermittent GI discomfort that comes and goes but not enough to warrant medications for pain We discussed different treatment  options  REVIEW OF SYSTEMS:   Constitutional: Denies fevers, chills or abnormal weight loss Eyes: Denies blurriness of vision Ears, nose, mouth, throat, and face: Denies mucositis or sore throat Respiratory: Denies cough, dyspnea or wheezes Cardiovascular: Denies palpitation, chest discomfort or lower extremity swelling Gastrointestinal:  Denies nausea, heartburn or change in bowel habits Skin: Denies abnormal skin rashes Lymphatics: Denies new lymphadenopathy or easy bruising Neurological:Denies numbness, tingling or new weaknesses Behavioral/Psych: Mood is stable, no new changes  All other systems were reviewed with the patient and are negative.  I have reviewed the past medical history, past surgical history, social history and family history with the patient and they are unchanged from previous note.  ALLERGIES:  has No Known Allergies.  MEDICATIONS:  Current Outpatient Medications  Medication Sig Dispense Refill   acetaminophen (TYLENOL) 500 MG tablet Take 500 mg by mouth every 6 (six) hours as needed for mild pain, moderate pain or fever.     aspirin 81 MG EC tablet Take by mouth daily.     Cholecalciferol (VITAMIN D3 PO) Take 1 tablet by mouth daily.     furosemide (LASIX) 20 MG tablet Take 20 mg by mouth as needed for fluid or edema. Only takes 2-3 days per week     lidocaine-prilocaine (EMLA) cream Apply 1 Application topically as needed. 30 g 3   loratadine (CLARITIN) 10 MG tablet Take 10 mg by mouth daily.     LORazepam (ATIVAN) 0.5 MG tablet Take 1 tablet (0.5 mg total) by mouth 2 (two) times daily as needed for anxiety. 10 tablet 0   losartan (COZAAR) 50 MG tablet Take 1 tablet (50 mg total) by mouth daily.  90 tablet 1   metoprolol tartrate (LOPRESSOR) 25 MG tablet Take 1 tablet (25 mg total) by mouth 2 (two) times daily. 180 tablet 1   Multiple Vitamin (MULTIVITAMIN WITH MINERALS) TABS tablet Take 1 tablet by mouth daily. 30 tablet 1   omeprazole (PRILOSEC) 20 MG capsule  Take 1 capsule (20 mg total) by mouth 2 (two) times daily. 28 capsule 0   ondansetron (ZOFRAN) 8 MG tablet Take 1 tablet (8 mg total) by mouth every 8 (eight) hours as needed for nausea. 30 tablet 3   No current facility-administered medications for this visit.    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 No extra uterine extension. No evidence of metastatic disease within the pelvis.   Mild thickening of the endometrial stripe, likely related to entrapment by the adjacent uterine mass   06/03/2021 Initial Diagnosis   Uterine sarcoma (HCC)   06/03/2021 Cancer Staging   Staging form: Corpus Uteri - Sarcoma, AJCC 7th Edition - Clinical stage from 06/03/2021: FIGO Stage IVB (rT1b, N0, M1) - Signed by Artis Delay, MD on 03/31/2022 Diagnostic confirmation: Positive histology Stage prefix: Recurrence Biopsy of metastatic site performed: No Lymph-vascular invasion (LVI): LVI present/identified, NOS   06/03/2021 Pathology Results   FINAL MICROSCOPIC DIAGNOSIS:   A. UTERUS, CERVIX, BILATERAL FALLOPIAN TUBES AND OVARIES, HYSTERECTOMY:  - Uterus:       Mixed high grade uterine sarcoma, spanning 6 cm, see comment.       Extensive lymphovascular invasion.       See oncology table.  - Cervix: Benign squamous and endocervical mucosa. No dysplasia or  malignancy.  - Bilateral ovaries: Unremarkable. No malignancy.  - Bilateral fallopian tubes: Unremarkable. No malignancy.   ONCOLOGY TABLE:   UTERUS, SARCOMA: Resection   Procedure: Total hysterectomy and bilateral salpingo-oophorectomy  Specimen  Integrity: Focally disrupted on posterior surface  Tumor Site: Anterior wall  Tumor Size: 6 cm  Histologic Type: High grade sarcoma, mixed. See comment.  Other Tissue/ Organ Involvement: Not identified  Lymphovascular Invasion: Present, extensive.  Margins: All margins negative for tumor  Regional Lymph Nodes: Not applicable (no lymph nodes submitted or found)  Distant Metastasis:       Distant Site(s) Involved: Not applicable  Pathologic Stage Classification (pTNM, AJCC 8th Edition): pT1b, pN not  assigned  Ancillary Studies: Can be performed upon request  Representative Tumor Block: A6  Comment(s): The tumor has two morphologically different components. One component consists of malignant spindle cells which can be seen arising from the smooth muscle. These foci are positive for SMA, desmin, cyclinD1 (focal), and CD10 (patchy). The other component consists of round to slightly spindle cells with abundant admixed vessels and occasional large pleomorphic cells. These areas are positive for SMA (patchy weak), desmin (focal), CD10 (variable with diffuse areas). Pancytokeratin is negative. 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   ADDENDUM:   PROGNOSTIC INDICATOR RESULTS:   Immunohistochemical and morphometric analysis performed manually    Estrogen Receptor:       POSITIVE, 50%, WEAK TO MODERATE STAINING  Progesterone Receptor:   POSITIVE, 50%, MODERATE TO STRONG STAINING   Reference Range  Estrogen and Progesterone Receptor       Negative  0%       Positive  >1%    06/03/2021 Surgery   Surgeon: Quinn Axe    Assistants: Dr Antionette Char (an MD assistant was necessary for tissue manipulation, management of robotic instrumentation, retraction and positioning due to the complexity of the case and hospital policies).  Operation: Robotic-assisted laparoscopic total hysterectomy >250gm with bilateral  salpingoophorectomy, minilaparotomy for specimen delivery   Surgeon: Quinn Axe    Operative Findings:  : Bulky 16cm uterus with intra-uterine mass (consistent with a fibroid-like mass) , smooth normal appearing serosa, no suspicious bulky nodes. Normal ovaries bilaterally. Normal upper abdomen. Uterus too large to deliver vaginally in tact.    06/18/2021 Imaging   1. Multiple bilateral pulmonary nodules, measuring up to 9 mm. Most of these are perifissural and subpleural in location, likely lymph nodes. Nevertheless, close follow-up recommended to exclude metastatic disease. 2. 11 mm hypoattenuating lesion towards the dome of the liver, indeterminate. MRI abdomen with and without contrast could be used to further evaluate as clinically warranted. 3. Aortic Atherosclerosis (ICD10-I70.0).   10/13/2021 Imaging   1. No acute findings within the abdomen or pelvis. No specific findings identified to suggest residual or recurrence of tumor. 2. Stable small pulmonary nodules within the left lower lobe.   03/18/2022 Imaging   1. Interval development of 2 soft tissue nodules in the anterior pelvis measuring 10 mm and 7 mm respectively. Close follow-up recommended to exclude metastatic disease. PET-CT may be warranted to further evaluate. 2. Development of 2 mm left upper lobe parenchymal nodule, nonspecific. Close attention on follow-up imaging recommended  3. Stable appearance of index bilateral pulmonary nodules identified previously. 4. Aortic Atherosclerosis (ICD10-I70.0).   04/02/2022 PET scan   1. Signs of local tumor recurrence noted at the vaginal cuff. 2. 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 New since 10/13/2021 and progressive when compared with 03/17/2022. 3. New focal area of increased uptake within the right biceps femoris muscle which is suspicious for skeletal muscle involvement. 4. Stable appearance of small pulmonary nodules  described on 03/17/2022. These are too small to characterize by PET-CT.   04/09/2022 Procedure   Successful placement of a right IJ approach Power Port with ultrasound and fluoroscopic guidance. The catheter is ready for use.   04/09/2022 Echocardiogram    1. Left ventricular ejection fraction, by estimation, is 65 to 70%. The left ventricle has normal function. The left ventricle has no regional wall motion abnormalities. There is mild concentric left ventricular hypertrophy. Left ventricular diastolic parameters are consistent with Grade I diastolic dysfunction (impaired relaxation).  2. Right ventricular systolic function is normal. The right ventricular size is normal.  3. The mitral valve is normal in structure. No evidence of mitral valve regurgitation.  4. The aortic valve is tricuspid. Aortic valve regurgitation is not visualized. No aortic stenosis is present.  5. The inferior vena cava is normal in size with greater than 50% respiratory variability, suggesting right atrial pressure of 3 mmHg.   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 Status post hysterectomy and bilateral oophorectomy. Multiple newand enlarged peritoneal nodules throughout the low abdomen and pelvis, previously FDG avid and consistent with worsened locally recurrent and peritoneal metastatic disease. 2. Multiple small bilateral pulmonary nodules, several of which are slightly enlarged compared  to prior examination, consistent with worsened pulmonary metastatic disease.   Aortic Atherosclerosis (ICD10-I70.0).     06/25/2022 - 03/04/2023 Chemotherapy   Patient is on Treatment Plan : UTERINE UNDIFFERENTIATED LEIOMYOSARCOMA Gemcitabine D1,8 + Docetaxel D8 (900/100) q21d     09/02/2022 Imaging   1. Moderate response to therapy of peritoneal metastasis within the anterior pelvis. 2. Minimal response to therapy of  pulmonary metastasis. 3. No new or progressive disease. 4.  Aortic Atherosclerosis (ICD10-I70.0).     12/01/2022 Imaging   1. No new or progressive findings in the chest, abdomen, or pelvis. 2. Peritoneal metastases in the pelvis described previously measure smaller today compatible with interval response to therapy. 3. Stable tiny bilateral pulmonary nodules. Previously characterized as metastatic lesions, these are unchanged in the interval. No new suspicious pulmonary nodule or mass. 4.  Aortic Atherosclerois (ICD10-170.0)   03/04/2023 Imaging   CT CHEST ABDOMEN PELVIS W CONTRAST  Result Date: 03/03/2023 CLINICAL DATA:  Cervical cancer * Tracking Code: BO *. Assess treatment response. Current chemotherapy. Prior total hysterectomy EXAM: CT CHEST, ABDOMEN, AND PELVIS WITH CONTRAST TECHNIQUE: Multidetector CT imaging of the chest, abdomen and pelvis was performed following the standard protocol during bolus administration of intravenous contrast. RADIATION DOSE REDUCTION: This exam was performed according to the departmental dose-optimization program which includes automated exposure control, adjustment of the mA and/or kV according to patient size and/or use of iterative reconstruction technique. CONTRAST:  OMNIPAQUE IOHEXOL 300 MG/ML  SOLN COMPARISON:  CT 12/01/2022 and older FINDINGS: CT CHEST FINDINGS Cardiovascular: Right upper chest port. Heart is nonenlarged. Trace pericardial fluid. Normal caliber thoracic aorta. Mediastinum/Nodes: No specific abnormal lymph node enlargement seen in the axillary region, hilum or mediastinum. Mildly patulous thoracic esophagus. Thyroid gland is unremarkable. Lungs/Pleura: Tiny pleural effusions are seen, left-greater-than-right. These new from previous. There is some patchy parenchymal opacity identified in the superior segment of the lower lobes, left-greater-than-right. This could be infiltrative rather than neoplastic. Recommend short follow-up. There  were some small nodules identified on the prior. For example 3 mm nodule lateral left lower lobe subpleural on series 7, image 91 is stable. Right lower lobe subpleural nodule on the prior which measured 9 x 6 mm, today is stable on image 73 of series 7. Stable 2-3 mm nodule right lower lobe on series 7, image 97. Musculoskeletal: Curvature of the spine with some degenerative changes. CT ABDOMEN PELVIS FINDINGS Hepatobiliary: Stable dome hepatic cyst. No new space-occupying lesion in the liver. Patent portal vein. Gallbladder is distended. Pancreas: Unremarkable. No pancreatic ductal dilatation or surrounding inflammatory changes. Spleen: Spleen is slightly enlarged at 13.1 cm in AP length. Preserved enhancement. Small splenule inferiorly. Adrenals/Urinary Tract: The adrenal glands are preserved. Tiny low-attenuation cystic lesions are seen which are too small to completely characterize along the kidneys. Bosniak 2 lesions. No specific imaging follow-up. The ureters have normal course and caliber down to the bladder. Bladder wall is diffusely thickened. The bladder is contracted. There is significant stranding. This is progressive from the previous examination. Stomach/Bowel: Large bowel has a normal course and caliber with scattered stool. Overall moderate stool burden. Normal appendix extends inferior to the cecum in the right lower quadrant. The stomach and small bowel are nondilated. Vascular/Lymphatic: Normal caliber aorta and IVC with some vascular calcifications. No specific abnormal lymph node enlargement identified in the abdomen and pelvis. Reproductive: Absence of the uterus and ovaries. Other: Once again there is a pelvic soft tissue nodule anteriorly on series 2, image 104 which on  the prior measured 2.1 x 1.6 cm and today 1.9 x 1.3 cm. Smaller foci more caudal on axial image 111 and 112 are stable. Musculoskeletal: Curvature and degenerative changes are seen along the spine. Degenerative changes along  the pelvis. IMPRESSION: Stable pelvic peritoneal nodules. No new lymph node enlargement seen in the chest, abdomen or pelvis. New tiny pleural effusions, left-greater-than-right with some ill-defined parenchymal opacities along the lower lobes. Although there is a differential this could be infectious or inflammatory. Recommend short follow-up and correlation to specific symptoms. Previous tiny lung nodules are stable. Continued follow-up surveillance as per the patient's neoplasm. Mild splenomegaly. Worsening bladder wall thickening and stranding. Please correlate with symptoms and treatment Electronically Signed   By: Karen Kays M.D.   On: 03/03/2023 18:23      04/05/2023 Imaging   Normal bone density scan   06/21/2023 Imaging   CT CHEST ABDOMEN PELVIS W CONTRAST  Result Date: 06/20/2023 CLINICAL DATA:  Endometrial cancer restaging, chemotherapy complete, ongoing oral chemotherapy * Tracking Code: BO * EXAM: CT CHEST, ABDOMEN, AND PELVIS WITH CONTRAST TECHNIQUE: Multidetector CT imaging of the chest, abdomen and pelvis was performed following the standard protocol during bolus administration of intravenous contrast. RADIATION DOSE REDUCTION: This exam was performed according to the departmental dose-optimization program which includes automated exposure control, adjustment of the mA and/or kV according to patient size and/or use of iterative reconstruction technique. CONTRAST:  OMNIPAQUE IOHEXOL 300 MG/ML  SOLN COMPARISON:  CT chest angiogram, 03/23/2023, CT chest abdomen pelvis, 03/02/2023 FINDINGS: CT CHEST FINDINGS Cardiovascular: No significant vascular findings. Normal heart size. No pericardial effusion. Mediastinum/Nodes: No enlarged mediastinal, hilar, or axillary lymph nodes. Thyroid gland, trachea, and esophagus demonstrate no significant findings. Lungs/Pleura: Interval resolution of previously seen pleural effusions. Significant interval improvement of previously seen heterogeneous and  ground-glass airspace opacity, with small residual opacities present in the lower lobes (series 4, image 68). Multiple small subpleural nodules are unchanged, largest in the dependent right lower lobe measuring 0.9 cm (series 4, image 75). Musculoskeletal: No chest wall abnormality. No acute osseous findings. CT ABDOMEN PELVIS FINDINGS Hepatobiliary: No solid liver abnormality is seen. Benign simple cyst of the liver dome, requiring no further follow-up or characterization (series 2, image 45). No gallstones, gallbladder wall thickening, or biliary dilatation. Pancreas: Unremarkable. No pancreatic ductal dilatation or surrounding inflammatory changes. Spleen: Normal in size without significant abnormality. Adrenals/Urinary Tract: Adrenal glands are unremarkable. Simple, benign right renal cortical cysts, for which no further follow-up or characterization is required. Kidneys are otherwise normal, without renal calculi, solid lesion, or hydronephrosis. Bladder is unremarkable. Stomach/Bowel: Stomach is within normal limits. Appendix appears normal. No evidence of bowel wall thickening, distention, or inflammatory changes. Moderate burden of stool throughout the colon and rectum. Vascular/Lymphatic: No significant vascular findings are present. No enlarged abdominal or pelvic lymph nodes. Reproductive: Status post hysterectomy. Other: No abdominal wall hernia or abnormality. No ascites. Largest soft tissue nodule in the low midline abdomen is increased in size, measuring 2.8 x 2.5 cm, previously 2.4 x 2.1 cm (series 2, image 105). Multiple additional soft tissue nodules throughout the pelvis are unchanged, largest in the anterior right pelvis measuring 1.6 x 1.2 cm (series 2, image 112). Additional nodule at the right aspect of the vaginal cuff measures 2.0 x 1.0 cm (series 2, image 112). Musculoskeletal: No acute osseous findings. IMPRESSION: 1. Largest peritoneal soft tissue nodule in the low midline abdomen is  increased in size, consistent with worsened peritoneal metastatic disease. Multiple  additional soft tissue nodules throughout the pelvis are unchanged. 2. 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 3. Interval resolution of previously seen pleural effusions. 4. Significant interval improvement of previously seen heterogeneous and ground-glass airspace opacity, with small residual opacities present in the lower lobes. Findings are consistent with improved nonspecific infection or inflammation. 5. Status post hysterectomy. Electronically Signed   By: Jearld Lesch M.D.   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 * Tracking Code: BO * EXAM: CT CHEST, ABDOMEN, AND PELVIS WITH CONTRAST TECHNIQUE: Multidetector CT imaging of the chest, abdomen and pelvis was performed following the standard protocol during bolus administration of intravenous contrast. RADIATION DOSE REDUCTION: This exam was performed according to the departmental dose-optimization program which includes automated exposure control, adjustment of the mA and/or kV according to patient size and/or use of iterative reconstruction technique. CONTRAST:  OMNIPAQUE IOHEXOL 300 MG/ML  SOLN COMPARISON:  Multiple priors including most recent CT June 18, 2023 FINDINGS: CT CHEST FINDINGS Cardiovascular: Aortic atherosclerosis. Accessed right chest Port-A-Cath with tip at the superior cavoatrial junction. No central pulmonary embolus on this nondedicated study. Normal size heart. No significant pericardial effusion/thickening. Mediastinum/Nodes: No suspicious thyroid nodule. No pathologically enlarged mediastinal, hilar or axillary lymph nodes. Small hiatal hernia. Lungs/Pleura: Bilateral solid pulmonary nodules, some of which are stable in size others are increased in size. For reference: -left upper  lobe pulmonary nodule measuring 6 mm on image 77/2 previously measured 3 mm. -subpleural right lower lobe pulmonary nodule measures 9 mm on image 77/7, unchanged. A few new scattered tiny pulmonary nodules for instance a 3 mm pulmonary nodule in the right lower lobe on image 87/7. Musculoskeletal: No aggressive lytic or blastic lesion of bone. Multilevel degenerative changes spine. CT ABDOMEN PELVIS FINDINGS Hepatobiliary: Stable 9 mm hypodense lesion in the dome of the liver previously characterized as a cyst on MRI July 04, 2021. No suspicious hepatic lesion. Gallbladder is unremarkable. No biliary ductal dilation. Pancreas: No pancreatic ductal dilation or evidence of acute inflammation. Spleen: No splenomegaly. Adrenals/Urinary Tract: Bilateral adrenal glands appear normal. No hydronephrosis. Stomach/Bowel: No radiopaque enteric contrast material was administered. Stomach is unremarkable for degree of distension. Normal appendix. No pathologic dilation of small or large bowel. Vascular/Lymphatic: Aortic atherosclerosis. Normal caliber abdominal aorta. Smooth IVC contours. The portal, splenic and superior mesenteric veins are patent. No pathologically enlarged abdominal or pelvic lymph nodes. Reproductive: Prior hysterectomy with increased size of the vaginal cuff nodules previously indexed nodule in the right vaginal cuff measures 3.2 cm on image 116/2 previously 2.0 cm. Other: Increased size and number of peritoneal/omental soft tissue implants. For reference: -previously indexed soft tissue nodule in the anterior pelvis now measures 5.7 x 3.6 cm on image 105/2 previously 2.8 x 2.5 cm. -new pelvic soft tissue nodule measures 2.3 cm on image 106/2. Trace free fluid in the pelvis with a new walled-off pelvic fluid collection measuring 3.9 cm adjacent to a soft tissue implant on image 105/2. Musculoskeletal: No aggressive lytic or blastic lesion of bone. IMPRESSION: 1. Worsening peritoneal carcinomatosis,  increased size of the vaginal cuff nodules, and increased size/of bilateral solid pulmonary nodules, consistent with worsening metastatic disease. 2. Aortic Atherosclerosis (ICD10-I70.0). Electronically Signed   By: Maudry Mayhew M.D.   On: 09/19/2023 12:38        PHYSICAL EXAMINATION: ECOG PERFORMANCE STATUS: 1 - Symptomatic  but completely ambulatory  Vitals:   09/22/23 0756  BP: (!) 150/88  Pulse: 95  Resp: 18  Temp: 98 F (36.7 C)  SpO2: 97%   Filed Weights   09/22/23 0756  Weight: 189 lb 6.4 oz (85.9 kg)    GENERAL:alert, no distress and comfortable   LABORATORY DATA:  I have reviewed the data as listed    Component Value Date/Time   NA 138 09/17/2023 0730   K 4.0 09/17/2023 0730   CL 104 09/17/2023 0730   CO2 28 09/17/2023 0730   GLUCOSE 112 (H) 09/17/2023 0730   BUN 11 09/17/2023 0730   CREATININE 0.55 09/17/2023 0730   CREATININE 0.85 03/18/2023 0738   CALCIUM 8.8 (L) 09/17/2023 0730   PROT 6.7 09/17/2023 0730   ALBUMIN 3.5 09/17/2023 0730   AST 13 (L) 09/17/2023 0730   AST 17 03/18/2023 0738   ALT 11 09/17/2023 0730   ALT 9 03/18/2023 0738   ALKPHOS 83 09/17/2023 0730   BILITOT 0.7 09/17/2023 0730   BILITOT 0.4 03/18/2023 0738   GFRNONAA >60 09/17/2023 0730   GFRNONAA >60 03/18/2023 0738    No results found for: "SPEP", "UPEP"  Lab Results  Component Value Date   WBC 9.9 09/17/2023   NEUTROABS 6.3 09/17/2023   HGB 12.6 09/17/2023   HCT 36.7 09/17/2023   MCV 89.7 09/17/2023   PLT 212 09/17/2023      Chemistry      Component Value Date/Time   NA 138 09/17/2023 0730   K 4.0 09/17/2023 0730   CL 104 09/17/2023 0730   CO2 28 09/17/2023 0730   BUN 11 09/17/2023 0730   CREATININE 0.55 09/17/2023 0730   CREATININE 0.85 03/18/2023 0738      Component Value Date/Time   CALCIUM 8.8 (L) 09/17/2023 0730   ALKPHOS 83 09/17/2023 0730   AST 13 (L) 09/17/2023 0730   AST 17 03/18/2023 0738   ALT 11 09/17/2023 0730   ALT 9 03/18/2023 0738    BILITOT 0.7 09/17/2023 0730   BILITOT 0.4 03/18/2023 0738       RADIOGRAPHIC STUDIES: I have reviewed multiple imaging studies with the patient I have personally reviewed the radiological images as listed and agreed with the findings in the report. 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 * Tracking Code: BO * EXAM: CT CHEST, ABDOMEN, AND PELVIS WITH CONTRAST TECHNIQUE: Multidetector CT imaging of the chest, abdomen and pelvis was performed following the standard protocol during bolus administration of intravenous contrast. RADIATION DOSE REDUCTION: This exam was performed according to the departmental dose-optimization program which includes automated exposure control, adjustment of the mA and/or kV according to patient size and/or use of iterative reconstruction technique. CONTRAST:  OMNIPAQUE IOHEXOL 300 MG/ML  SOLN COMPARISON:  Multiple priors including most recent CT June 18, 2023 FINDINGS: CT CHEST FINDINGS Cardiovascular: Aortic atherosclerosis. Accessed right chest Port-A-Cath with tip at the superior cavoatrial junction. No central pulmonary embolus on this nondedicated study. Normal size heart. No significant pericardial effusion/thickening. Mediastinum/Nodes: No suspicious thyroid nodule. No pathologically enlarged mediastinal, hilar or axillary lymph nodes. Small hiatal hernia. Lungs/Pleura: Bilateral solid pulmonary nodules, some of which are stable in size others are increased in size. For reference: -left upper lobe pulmonary nodule measuring 6 mm on image 77/2 previously measured 3 mm. -subpleural right lower lobe pulmonary nodule measures 9 mm on image 77/7, unchanged. A few new scattered tiny pulmonary nodules for  instance a 3 mm pulmonary nodule in the right lower lobe on image 87/7. Musculoskeletal: No aggressive lytic or blastic lesion of bone. Multilevel degenerative changes spine. CT ABDOMEN  PELVIS FINDINGS Hepatobiliary: Stable 9 mm hypodense lesion in the dome of the liver previously characterized as a cyst on MRI July 04, 2021. No suspicious hepatic lesion. Gallbladder is unremarkable. No biliary ductal dilation. Pancreas: No pancreatic ductal dilation or evidence of acute inflammation. Spleen: No splenomegaly. Adrenals/Urinary Tract: Bilateral adrenal glands appear normal. No hydronephrosis. Stomach/Bowel: No radiopaque enteric contrast material was administered. Stomach is unremarkable for degree of distension. Normal appendix. No pathologic dilation of small or large bowel. Vascular/Lymphatic: Aortic atherosclerosis. Normal caliber abdominal aorta. Smooth IVC contours. The portal, splenic and superior mesenteric veins are patent. No pathologically enlarged abdominal or pelvic lymph nodes. Reproductive: Prior hysterectomy with increased size of the vaginal cuff nodules previously indexed nodule in the right vaginal cuff measures 3.2 cm on image 116/2 previously 2.0 cm. Other: Increased size and number of peritoneal/omental soft tissue implants. For reference: -previously indexed soft tissue nodule in the anterior pelvis now measures 5.7 x 3.6 cm on image 105/2 previously 2.8 x 2.5 cm. -new pelvic soft tissue nodule measures 2.3 cm on image 106/2. Trace free fluid in the pelvis with a new walled-off pelvic fluid collection measuring 3.9 cm adjacent to a soft tissue implant on image 105/2. Musculoskeletal: No aggressive lytic or blastic lesion of bone. IMPRESSION: 1. Worsening peritoneal carcinomatosis, increased size of the vaginal cuff nodules, and increased size/of bilateral solid pulmonary nodules, consistent with worsening metastatic disease. 2. Aortic Atherosclerosis (ICD10-I70.0). Electronically Signed   By: Maudry Mayhew M.D.   On: 09/19/2023 12:38

## 2023-09-22 NOTE — Assessment & Plan Note (Addendum)
I have reviewed multiple imaging studies with the patient and her husband Fortunately, she has significant signs of disease progression She is marginally symptomatic with mild intermittent abdominal discomfort We discussed treatment options and current guidelines  Previously, we have done some molecular testing.  PD-L1 is only 3% positive.  She failed treatment with doxorubicin.  Despite tumor being 50% ER/PR positive, she has failed treatment with tamoxifen and Lupron. I explained to the patient why surgical intervention is not indicated.   We discussed risk and benefits of resumption of chemotherapy with gemcitabine with docetaxel versus Trabectedin At the end of the discussion, she is undecided We also discussed potential referral to other institution for second opinion  She will call me by the end of the week with her final decision

## 2023-09-23 ENCOUNTER — Other Ambulatory Visit: Payer: Self-pay | Admitting: Hematology and Oncology

## 2023-09-23 ENCOUNTER — Telehealth: Payer: Self-pay | Admitting: Oncology

## 2023-09-23 ENCOUNTER — Encounter: Payer: Self-pay | Admitting: Hematology and Oncology

## 2023-09-23 DIAGNOSIS — C549 Malignant neoplasm of corpus uteri, unspecified: Secondary | ICD-10-CM

## 2023-09-23 MED ORDER — DEXAMETHASONE 4 MG PO TABS: ORAL_TABLET | ORAL | 1 refills | Status: DC

## 2023-09-23 MED ORDER — PROCHLORPERAZINE MALEATE 10 MG PO TABS: 10.0000 mg | ORAL_TABLET | Freq: Four times a day (QID) | ORAL | 1 refills | Status: DC | PRN

## 2023-09-23 MED ORDER — LIDOCAINE-PRILOCAINE 2.5-2.5 % EX CREA: TOPICAL_CREAM | CUTANEOUS | 3 refills | Status: DC

## 2023-09-23 MED ORDER — ONDANSETRON HCL 8 MG PO TABS: 8.0000 mg | ORAL_TABLET | Freq: Three times a day (TID) | ORAL | 1 refills | Status: DC | PRN

## 2023-09-23 NOTE — Telephone Encounter (Signed)
Called [name] back and she would like to start chemo the week of 10/04/23 and would like to see Dr. Pricilla Holm for a second opinion.  Advised her that I will let her know about the appointment to see Dr. Pricilla Holm and that a scheduler will call her with the chemotherapy appointments.

## 2023-09-23 NOTE — Telephone Encounter (Signed)
[name] called about her treatment decision.  She wants to go back on the combo chemotherapy - gemcitabine with docetaxel.  She mentioned that it is her mom's 90th birthday next week and she also has a beach trip planned the weekend of 11/22. Can she start after this or would it be too long?  She is ok to start sooner if necessary.  She also would like to get a second opinion and is wondering if it would be better to see a sarcoma specialist or a Loss adjuster, chartered.

## 2023-09-23 NOTE — Telephone Encounter (Signed)
I recommend she starts treatment ASAP, preferably 1 dose on the week of 11/18 before her trip if she agrees For second opinion, there is no comparison between sarcoma specialist versus GYN ONC She can see either Dr. Pricilla Holm here

## 2023-09-24 ENCOUNTER — Other Ambulatory Visit: Payer: Self-pay | Admitting: Hematology and Oncology

## 2023-09-24 ENCOUNTER — Other Ambulatory Visit: Payer: Self-pay

## 2023-09-24 ENCOUNTER — Telehealth: Payer: Self-pay | Admitting: Hematology and Oncology

## 2023-09-24 ENCOUNTER — Ambulatory Visit: Payer: Managed Care, Other (non HMO) | Admitting: Hematology and Oncology

## 2023-09-24 NOTE — Telephone Encounter (Signed)
 Left patient a vm regarding upcoming appointment

## 2023-09-27 ENCOUNTER — Telehealth: Payer: Self-pay | Admitting: Oncology

## 2023-09-27 NOTE — Telephone Encounter (Signed)
[name] called regarding an appointment with Dr. Pricilla Holm.  Advised we will call her with an appointment in the next couple of days. She verbalized understanding and agreement.

## 2023-09-28 NOTE — Progress Notes (Signed)
Pharmacist Chemotherapy Monitoring - Initial Assessment    Anticipated start date: 10/05/2023   The following has been reviewed per standard work regarding the patient's treatment regimen: The patient's diagnosis, treatment plan and drug doses, and organ/hematologic function Lab orders and baseline tests specific to treatment regimen  The treatment plan start date, drug sequencing, and pre-medications Prior authorization status  Patient's documented medication list, including drug-drug interaction screen and prescriptions for anti-emetics and supportive care specific to the treatment regimen The drug concentrations, fluid compatibility, administration routes, and timing of the medications to be used The patient's access for treatment and lifetime cumulative dose history, if applicable  The patient's medication allergies and previous infusion related reactions, if applicable   Changes made to treatment plan:  N/A  Follow up needed:  Pending authorization for treatment    Drusilla Kanner, PharmD, MBA

## 2023-09-30 ENCOUNTER — Telehealth: Payer: Self-pay | Admitting: Oncology

## 2023-09-30 NOTE — Progress Notes (Signed)
Gynecologic Oncology Return Clinic Visit  10/01/23  Reason for Visit: treatment planning  Treatment History: Oncology History Overview Note  High grade and LMS, 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 No extra uterine extension. No evidence of metastatic disease within the pelvis.   Mild thickening of the endometrial stripe, likely related to entrapment by the adjacent uterine mass   06/03/2021 Initial Diagnosis   Uterine sarcoma (HCC)   06/03/2021 Cancer Staging   Staging form: Corpus Uteri - Sarcoma, AJCC 7th Edition - Clinical stage from 06/03/2021: FIGO Stage IVB (rT1b, N0, M1) - Signed by Artis Delay, MD on 03/31/2022 Diagnostic confirmation: Positive histology Stage prefix: Recurrence Biopsy of metastatic site performed: No Lymph-vascular invasion (LVI): LVI present/identified, NOS   06/03/2021 Pathology Results   FINAL MICROSCOPIC DIAGNOSIS:   A. UTERUS, CERVIX, BILATERAL FALLOPIAN TUBES AND OVARIES, HYSTERECTOMY:  - Uterus:       Mixed high grade uterine sarcoma, spanning 6 cm, see comment.       Extensive lymphovascular invasion.       See oncology table.  - Cervix: Benign squamous and endocervical mucosa. No dysplasia or  malignancy.  - Bilateral ovaries: Unremarkable. No malignancy.  - Bilateral fallopian tubes: Unremarkable. No malignancy.   ONCOLOGY TABLE:   UTERUS, SARCOMA: Resection   Procedure: Total hysterectomy and bilateral salpingo-oophorectomy  Specimen Integrity: Focally disrupted on posterior surface  Tumor Site: Anterior wall  Tumor Size: 6 cm  Histologic Type: High grade sarcoma, mixed. See comment.  Other Tissue/ Organ  Involvement: Not identified  Lymphovascular Invasion: Present, extensive.  Margins: All margins negative for tumor  Regional Lymph Nodes: Not applicable (no lymph nodes submitted or found)  Distant Metastasis:       Distant Site(s) Involved: Not applicable  Pathologic Stage Classification (pTNM, AJCC 8th Edition): pT1b, pN not  assigned  Ancillary Studies: Can be performed upon request  Representative Tumor Block: A6  Comment(s): The tumor has two morphologically different components. One component consists of malignant spindle cells which can be seen arising from the smooth muscle. These foci are positive for SMA, desmin, cyclinD1 (focal), and CD10 (patchy). The other component consists of round to slightly spindle cells with abundant admixed vessels and occasional large pleomorphic cells. These areas are positive for SMA (patchy weak), desmin (focal), CD10 (variable with diffuse areas). Pancytokeratin is negative. 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   ADDENDUM:   PROGNOSTIC INDICATOR RESULTS:   Immunohistochemical and morphometric analysis performed manually    Estrogen Receptor:       POSITIVE, 50%, WEAK TO MODERATE STAINING  Progesterone Receptor:   POSITIVE, 50%, MODERATE TO STRONG STAINING   Reference Range Estrogen and Progesterone Receptor       Negative  0%       Positive  >1%    06/03/2021 Surgery   Surgeon: Quinn Axe    Assistants: Dr Antionette Char (an MD assistant was necessary for tissue manipulation, management of robotic instrumentation, retraction and positioning due to the complexity of the case and hospital policies).  Operation: Robotic-assisted laparoscopic total hysterectomy >250gm with bilateral salpingoophorectomy, minilaparotomy for specimen delivery   Surgeon: Quinn Axe    Operative Findings:  : Bulky  16cm uterus with intra-uterine mass (consistent with a  fibroid-like mass) , smooth normal appearing serosa, no suspicious bulky nodes. Normal ovaries bilaterally. Normal upper abdomen. Uterus too large to deliver vaginally in tact.    06/18/2021 Imaging   1. Multiple bilateral pulmonary nodules, measuring up to 9 mm. Most of these are perifissural and subpleural in location, likely lymph nodes. Nevertheless, close follow-up recommended to exclude metastatic disease. 2. 11 mm hypoattenuating lesion towards the dome of the liver, indeterminate. MRI abdomen with and without contrast could be used to further evaluate as clinically warranted. 3. Aortic Atherosclerosis (ICD10-I70.0).   10/13/2021 Imaging   1. No acute findings within the abdomen or pelvis. No specific findings identified to suggest residual or recurrence of tumor. 2. Stable small pulmonary nodules within the left lower lobe.   03/18/2022 Imaging   1. Interval development of 2 soft tissue nodules in the anterior pelvis measuring 10 mm and 7 mm respectively. Close follow-up recommended to exclude metastatic disease. PET-CT may be warranted to further evaluate. 2. Development of 2 mm left upper lobe parenchymal nodule, nonspecific. Close attention on follow-up imaging recommended  3. Stable appearance of index bilateral pulmonary nodules identified previously. 4. Aortic Atherosclerosis (ICD10-I70.0).   04/02/2022 PET scan   1. Signs of local tumor recurrence noted at the vaginal cuff. 2. 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 New since 10/13/2021 and progressive when compared with 03/17/2022. 3. New focal area of increased uptake within the right biceps femoris muscle which is suspicious for skeletal muscle involvement. 4. Stable appearance of small pulmonary nodules described on 03/17/2022. These are too small to characterize by PET-CT.   04/09/2022 Procedure   Successful placement of a right IJ approach Power Port with ultrasound and fluoroscopic  guidance. The catheter is ready for use.   04/09/2022 Echocardiogram    1. Left ventricular ejection fraction, by estimation, is 65 to 70%. The left ventricle has normal function. The left ventricle has no regional wall motion abnormalities. There is mild concentric left ventricular hypertrophy. Left ventricular diastolic parameters are consistent with Grade I diastolic dysfunction (impaired relaxation).  2. Right ventricular systolic function is normal. The right ventricular size is normal.  3. The mitral valve is normal in structure. No evidence of mitral valve regurgitation.  4. The aortic valve is tricuspid. Aortic valve regurgitation is not visualized. No aortic stenosis is present.  5. The inferior vena cava is normal in size with greater than 50% respiratory variability, suggesting right atrial pressure of 3 mmHg.   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 Status post hysterectomy and bilateral oophorectomy. Multiple newand enlarged peritoneal nodules throughout the low abdomen and pelvis, previously FDG avid and consistent with worsened locally recurrent and peritoneal metastatic disease. 2. Multiple small bilateral pulmonary nodules, several of which are slightly enlarged compared to prior examination, consistent with worsened pulmonary metastatic disease.   Aortic Atherosclerosis (ICD10-I70.0).     06/25/2022 - 03/04/2023 Chemotherapy   Patient is on Treatment Plan : UTERINE UNDIFFERENTIATED LEIOMYOSARCOMA Gemcitabine D1,8 + Docetaxel D8 (900/100) q21d     09/02/2022 Imaging   1. Moderate response to therapy of peritoneal metastasis within the anterior pelvis. 2. Minimal response to therapy of pulmonary metastasis. 3. No new or progressive disease. 4.  Aortic Atherosclerosis (ICD10-I70.0).     12/01/2022 Imaging   1. No new or progressive findings  in the chest, abdomen, or  pelvis. 2. Peritoneal metastases in the pelvis described previously measure smaller today compatible with interval response to therapy. 3. Stable tiny bilateral pulmonary nodules. Previously characterized as metastatic lesions, these are unchanged in the interval. No new suspicious pulmonary nodule or mass. 4.  Aortic Atherosclerois (ICD10-170.0)   03/04/2023 Imaging   CT CHEST ABDOMEN PELVIS W CONTRAST  Result Date: 03/03/2023 CLINICAL DATA:  Cervical cancer * Tracking Code: BO *. Assess treatment response. Current chemotherapy. Prior total hysterectomy EXAM: CT CHEST, ABDOMEN, AND PELVIS WITH CONTRAST TECHNIQUE: Multidetector CT imaging of the chest, abdomen and pelvis was performed following the standard protocol during bolus administration of intravenous contrast. RADIATION DOSE REDUCTION: This exam was performed according to the departmental dose-optimization program which includes automated exposure control, adjustment of the mA and/or kV according to patient size and/or use of iterative reconstruction technique. CONTRAST:  OMNIPAQUE IOHEXOL 300 MG/ML  SOLN COMPARISON:  CT 12/01/2022 and older FINDINGS: CT CHEST FINDINGS Cardiovascular: Right upper chest port. Heart is nonenlarged. Trace pericardial fluid. Normal caliber thoracic aorta. Mediastinum/Nodes: No specific abnormal lymph node enlargement seen in the axillary region, hilum or mediastinum. Mildly patulous thoracic esophagus. Thyroid gland is unremarkable. Lungs/Pleura: Tiny pleural effusions are seen, left-greater-than-right. These new from previous. There is some patchy parenchymal opacity identified in the superior segment of the lower lobes, left-greater-than-right. This could be infiltrative rather than neoplastic. Recommend short follow-up. There were some small nodules identified on the prior. For example 3 mm nodule lateral left lower lobe subpleural on series 7, image 91 is stable. Right lower lobe subpleural nodule on the prior  which measured 9 x 6 mm, today is stable on image 73 of series 7. Stable 2-3 mm nodule right lower lobe on series 7, image 97. Musculoskeletal: Curvature of the spine with some degenerative changes. CT ABDOMEN PELVIS FINDINGS Hepatobiliary: Stable dome hepatic cyst. No new space-occupying lesion in the liver. Patent portal vein. Gallbladder is distended. Pancreas: Unremarkable. No pancreatic ductal dilatation or surrounding inflammatory changes. Spleen: Spleen is slightly enlarged at 13.1 cm in AP length. Preserved enhancement. Small splenule inferiorly. Adrenals/Urinary Tract: The adrenal glands are preserved. Tiny low-attenuation cystic lesions are seen which are too small to completely characterize along the kidneys. Bosniak 2 lesions. No specific imaging follow-up. The ureters have normal course and caliber down to the bladder. Bladder wall is diffusely thickened. The bladder is contracted. There is significant stranding. This is progressive from the previous examination. Stomach/Bowel: Large bowel has a normal course and caliber with scattered stool. Overall moderate stool burden. Normal appendix extends inferior to the cecum in the right lower quadrant. The stomach and small bowel are nondilated. Vascular/Lymphatic: Normal caliber aorta and IVC with some vascular calcifications. No specific abnormal lymph node enlargement identified in the abdomen and pelvis. Reproductive: Absence of the uterus and ovaries. Other: Once again there is a pelvic soft tissue nodule anteriorly on series 2, image 104 which on the prior measured 2.1 x 1.6 cm and today 1.9 x 1.3 cm. Smaller foci more caudal on axial image 111 and 112 are stable. Musculoskeletal: Curvature and degenerative changes are seen along the spine. Degenerative changes along the pelvis. IMPRESSION: Stable pelvic peritoneal nodules. No new lymph node enlargement seen in the chest, abdomen or pelvis. New tiny pleural effusions, left-greater-than-right with some  ill-defined parenchymal opacities along the lower lobes. Although there is a differential this could be infectious or inflammatory. Recommend short follow-up and correlation to specific symptoms. Previous tiny  lung nodules are stable. Continued follow-up surveillance as per the patient's neoplasm. Mild splenomegaly. Worsening bladder wall thickening and stranding. Please correlate with symptoms and treatment Electronically Signed   By: Karen Kays M.D.   On: 03/03/2023 18:23      04/05/2023 Imaging   Normal bone density scan   06/21/2023 Imaging   CT CHEST ABDOMEN PELVIS W CONTRAST  Result Date: 06/20/2023 CLINICAL DATA:  Endometrial cancer restaging, chemotherapy complete, ongoing oral chemotherapy * Tracking Code: BO * EXAM: CT CHEST, ABDOMEN, AND PELVIS WITH CONTRAST TECHNIQUE: Multidetector CT imaging of the chest, abdomen and pelvis was performed following the standard protocol during bolus administration of intravenous contrast. RADIATION DOSE REDUCTION: This exam was performed according to the departmental dose-optimization program which includes automated exposure control, adjustment of the mA and/or kV according to patient size and/or use of iterative reconstruction technique. CONTRAST:  OMNIPAQUE IOHEXOL 300 MG/ML  SOLN COMPARISON:  CT chest angiogram, 03/23/2023, CT chest abdomen pelvis, 03/02/2023 FINDINGS: CT CHEST FINDINGS Cardiovascular: No significant vascular findings. Normal heart size. No pericardial effusion. Mediastinum/Nodes: No enlarged mediastinal, hilar, or axillary lymph nodes. Thyroid gland, trachea, and esophagus demonstrate no significant findings. Lungs/Pleura: Interval resolution of previously seen pleural effusions. Significant interval improvement of previously seen heterogeneous and ground-glass airspace opacity, with small residual opacities present in the lower lobes (series 4, image 68). Multiple small subpleural nodules are unchanged, largest in the dependent right  lower lobe measuring 0.9 cm (series 4, image 75). Musculoskeletal: No chest wall abnormality. No acute osseous findings. CT ABDOMEN PELVIS FINDINGS Hepatobiliary: No solid liver abnormality is seen. Benign simple cyst of the liver dome, requiring no further follow-up or characterization (series 2, image 45). No gallstones, gallbladder wall thickening, or biliary dilatation. Pancreas: Unremarkable. No pancreatic ductal dilatation or surrounding inflammatory changes. Spleen: Normal in size without significant abnormality. Adrenals/Urinary Tract: Adrenal glands are unremarkable. Simple, benign right renal cortical cysts, for which no further follow-up or characterization is required. Kidneys are otherwise normal, without renal calculi, solid lesion, or hydronephrosis. Bladder is unremarkable. Stomach/Bowel: Stomach is within normal limits. Appendix appears normal. No evidence of bowel wall thickening, distention, or inflammatory changes. Moderate burden of stool throughout the colon and rectum. Vascular/Lymphatic: No significant vascular findings are present. No enlarged abdominal or pelvic lymph nodes. Reproductive: Status post hysterectomy. Other: No abdominal wall hernia or abnormality. No ascites. Largest soft tissue nodule in the low midline abdomen is increased in size, measuring 2.8 x 2.5 cm, previously 2.4 x 2.1 cm (series 2, image 105). Multiple additional soft tissue nodules throughout the pelvis are unchanged, largest in the anterior right pelvis measuring 1.6 x 1.2 cm (series 2, image 112). Additional nodule at the right aspect of the vaginal cuff measures 2.0 x 1.0 cm (series 2, image 112). Musculoskeletal: No acute osseous findings. IMPRESSION: 1. Largest peritoneal soft tissue nodule in the low midline abdomen is increased in size, consistent with worsened peritoneal metastatic disease. Multiple additional soft tissue nodules throughout the pelvis are unchanged. 2. Multiple small bilateral subpleural  pulmonary metastases are unchanged. 3. Interval resolution of previously seen pleural effusions. 4. Significant interval improvement of previously seen heterogeneous and ground-glass airspace opacity, with small residual opacities present in the lower lobes. Findings are consistent with improved nonspecific infection or inflammation. 5. Status post hysterectomy. Electronically Signed   By: Jearld Lesch M.D.   On: 06/20/2023 20:49      09/17/2023 Imaging   CT CHEST ABDOMEN PELVIS W CONTRAST  Result Date: 09/19/2023  CLINICAL DATA:  High-risk metastatic uterine sarcoma. Follow-up. * Tracking Code: BO * EXAM: CT CHEST, ABDOMEN, AND PELVIS WITH CONTRAST TECHNIQUE: Multidetector CT imaging of the chest, abdomen and pelvis was performed following the standard protocol during bolus administration of intravenous contrast. RADIATION DOSE REDUCTION: This exam was performed according to the departmental dose-optimization program which includes automated exposure control, adjustment of the mA and/or kV according to patient size and/or use of iterative reconstruction technique. CONTRAST:  OMNIPAQUE IOHEXOL 300 MG/ML  SOLN COMPARISON:  Multiple priors including most recent CT June 18, 2023 FINDINGS: CT CHEST FINDINGS Cardiovascular: Aortic atherosclerosis. Accessed right chest Port-A-Cath with tip at the superior cavoatrial junction. No central pulmonary embolus on this nondedicated study. Normal size heart. No significant pericardial effusion/thickening. Mediastinum/Nodes: No suspicious thyroid nodule. No pathologically enlarged mediastinal, hilar or axillary lymph nodes. Small hiatal hernia. Lungs/Pleura: Bilateral solid pulmonary nodules, some of which are stable in size others are increased in size. For reference: -left upper lobe pulmonary nodule measuring 6 mm on image 77/2 previously measured 3 mm. -subpleural right lower lobe pulmonary nodule measures 9 mm on image 77/7, unchanged. A few new scattered tiny  pulmonary nodules for instance a 3 mm pulmonary nodule in the right lower lobe on image 87/7. Musculoskeletal: No aggressive lytic or blastic lesion of bone. Multilevel degenerative changes spine. CT ABDOMEN PELVIS FINDINGS Hepatobiliary: Stable 9 mm hypodense lesion in the dome of the liver previously characterized as a cyst on MRI July 04, 2021. No suspicious hepatic lesion. Gallbladder is unremarkable. No biliary ductal dilation. Pancreas: No pancreatic ductal dilation or evidence of acute inflammation. Spleen: No splenomegaly. Adrenals/Urinary Tract: Bilateral adrenal glands appear normal. No hydronephrosis. Stomach/Bowel: No radiopaque enteric contrast material was administered. Stomach is unremarkable for degree of distension. Normal appendix. No pathologic dilation of small or large bowel. Vascular/Lymphatic: Aortic atherosclerosis. Normal caliber abdominal aorta. Smooth IVC contours. The portal, splenic and superior mesenteric veins are patent. No pathologically enlarged abdominal or pelvic lymph nodes. Reproductive: Prior hysterectomy with increased size of the vaginal cuff nodules previously indexed nodule in the right vaginal cuff measures 3.2 cm on image 116/2 previously 2.0 cm. Other: Increased size and number of peritoneal/omental soft tissue implants. For reference: -previously indexed soft tissue nodule in the anterior pelvis now measures 5.7 x 3.6 cm on image 105/2 previously 2.8 x 2.5 cm. -new pelvic soft tissue nodule measures 2.3 cm on image 106/2. Trace free fluid in the pelvis with a new walled-off pelvic fluid collection measuring 3.9 cm adjacent to a soft tissue implant on image 105/2. Musculoskeletal: No aggressive lytic or blastic lesion of bone. IMPRESSION: 1. Worsening peritoneal carcinomatosis, increased size of the vaginal cuff nodules, and increased size/of bilateral solid pulmonary nodules, consistent with worsening metastatic disease. 2. Aortic Atherosclerosis (ICD10-I70.0).  Electronically Signed   By: Maudry Mayhew M.D.   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       Doxorubicin Gemzar/taxotere Tamoxifen/Lupron  CARIS: ER + 60%, PR + 50%, PDL1 5%, p53 mutation, HER2 0, BRCA 1/2 negative MSS, TMB low  Interval History: Overall doing well.  Patient says she feels much better since being off chemotherapy.  Energy is back to 80%.  Endorses normal bowel function with nightly Senokot.  Denies any urinary symptoms.  Denies any vaginal bleeding.  Denies abdominal or pelvic pain.  Past Medical/Surgical History: Past  Medical History:  Diagnosis Date   Anemia    Arthritis    Asthma    remote history as a teenager   GERD (gastroesophageal reflux disease)    Hypertension    Menopausal symptoms 05/05/2011   Migraines 05/05/2011   Seasonal allergies    Uterine sarcoma (HCC)     Past Surgical History:  Procedure Laterality Date   ABDOMINAL HYSTERECTOMY     BREAST BIOPSY     x 2 benign   COLONOSCOPY N/A 09/18/2021   Procedure: COLONOSCOPY;  Surgeon: Malissa Hippo, MD;  Location: [facility];  Service: Endoscopy;  Laterality: N/A;  8:05   HYSTEROSCOPY  11/06/2020   IR IMAGING GUIDED PORT INSERTION  04/08/2022   ROBOTIC ASSISTED TOTAL HYSTERECTOMY WITH BILATERAL SALPINGO OOPHERECTOMY N/A 06/03/2021   Procedure: XI ROBOTIC ASSISTED TOTAL HYSTERECTOMY FOR UTERUS GREATER THAN 250G WITH BILATERAL SALPINGO OOPHORECTOMY;  Surgeon: Adolphus Birchwood, MD;  Location: [facility];  Service: Gynecology;  Laterality: N/A;   TUBAL LIGATION  05/05/2011    Family History  Problem Relation Age of Onset   Colon cancer Maternal Uncle    Pancreatic cancer Maternal Grandmother     Social History   Socioeconomic History   Marital status: Married    Spouse name: Not on file   Number of children: 2   Years of education: Not on file   Highest  education level: Not on file  Occupational History   Not on file  Tobacco Use   Smoking status: Never   Smokeless tobacco: Never  Vaping Use   Vaping status: Never Used  Substance and Sexual Activity   Alcohol use: No    Comment: Occasional   Drug use: No   Sexual activity: Yes  Other Topics Concern   Not on file  Social History Narrative   Not on file   Social Determinants of Health   Financial Resource Strain: Not on file  Food Insecurity: No Food Insecurity (03/26/2023)   Hunger Vital Sign    Worried About Running Out of Food in the Last Year: Never true    Ran Out of Food in the Last Year: Never true  Transportation Needs: No Transportation Needs (03/26/2023)   PRAPARE - Administrator, Civil Service (Medical): No    Lack of Transportation (Non-Medical): No  Physical Activity: Not on file  Stress: Not on file (09/20/2023)  Social Connections: Unknown (03/27/2022)   Received from [facility], [facility]   Social Network    Social Network: Not on file    Current Medications:  Current Outpatient Medications:    acetaminophen (TYLENOL) 500 MG tablet, Take 500 mg by mouth every 6 (six) hours as needed for mild pain, moderate pain or fever., Disp: , Rfl:    aspirin 81 MG EC tablet, Take by mouth daily., Disp: , Rfl:    Cholecalciferol (VITAMIN D3 PO), Take 1 tablet by mouth daily., Disp: , Rfl:    [START ON 10/04/2023] dexamethasone (DECADRON) 4 MG tablet, Take 2 tabs by mouth 2 times daily starting day before chemo with Taxotere. Then take 2 tabs daily for 2 days starting day after chemo. Take with food., Disp: 30 tablet, Rfl: 1   furosemide (LASIX) 20 MG tablet, Take 20 mg by mouth as needed for fluid or edema. Only takes 2-3 days per week, Disp: , Rfl:    lidocaine-prilocaine (EMLA) cream, Apply 1 Application topically as needed., Disp: 30 g, Rfl: 3   [START ON 10/04/2023] lidocaine-prilocaine (  EMLA) cream, Apply to affected area once, Disp: 30 g, Rfl: 3    loratadine (CLARITIN) 10 MG tablet, Take 10 mg by mouth daily., Disp: , Rfl:    LORazepam (ATIVAN) 0.5 MG tablet, Take 1 tablet (0.5 mg total) by mouth 2 (two) times daily as needed for anxiety., Disp: 10 tablet, Rfl: 0   losartan (COZAAR) 50 MG tablet, Take 1 tablet (50 mg total) by mouth daily., Disp: 90 tablet, Rfl: 1   metoprolol tartrate (LOPRESSOR) 25 MG tablet, Take 1 tablet (25 mg total) by mouth 2 (two) times daily., Disp: 180 tablet, Rfl: 1   Multiple Vitamin (MULTIVITAMIN WITH MINERALS) TABS tablet, Take 1 tablet by mouth daily., Disp: 30 tablet, Rfl: 1   omeprazole (PRILOSEC) 20 MG capsule, Take 1 capsule (20 mg total) by mouth 2 (two) times daily., Disp: 28 capsule, Rfl: 0   ondansetron (ZOFRAN) 8 MG tablet, Take 1 tablet (8 mg total) by mouth every 8 (eight) hours as needed for nausea., Disp: 30 tablet, Rfl: 3   [START ON 10/04/2023] ondansetron (ZOFRAN) 8 MG tablet, Take 1 tablet (8 mg total) by mouth every 8 (eight) hours as needed for nausea or vomiting., Disp: 30 tablet, Rfl: 1   [START ON 10/04/2023] prochlorperazine (COMPAZINE) 10 MG tablet, Take 1 tablet (10 mg total) by mouth every 6 (six) hours as needed for nausea or vomiting., Disp: 30 tablet, Rfl: 1  Review of Systems: Denies appetite changes, fevers, chills, fatigue, unexplained weight changes. Denies hearing loss, neck lumps or masses, mouth sores, ringing in ears or voice changes. Denies cough or wheezing.  Denies shortness of breath. Denies chest pain or palpitations. Denies leg swelling. Denies abdominal distention, pain, blood in stools, constipation, diarrhea, nausea, vomiting, or early satiety. Denies pain with intercourse, dysuria, frequency, hematuria or incontinence. Denies hot flashes, pelvic pain, vaginal bleeding or vaginal discharge.   Denies joint pain, back pain or muscle pain/cramps. Denies itching, rash, or wounds. Denies dizziness, headaches, numbness or seizures. Denies swollen lymph nodes or  glands, denies easy bruising or bleeding. Denies anxiety, depression, confusion, or decreased concentration.  Physical Exam: BP 133/75 (BP Location: Left Arm, Patient Position: Sitting)   Pulse 100   Temp 98.3 F (36.8 C) (Oral)   Resp 20   Wt 190 lb (86.2 kg)   SpO2 100%   BMI 29.76 kg/m  General: Alert, oriented, no acute distress. HEENT: Normal cephalic, atraumatic, sclera anicteric. Chest: Unlabored breathing on room air.  Laboratory & Radiologic Studies: 09/17/23: CT C/A/P 1. Worsening peritoneal carcinomatosis, increased size of the vaginal cuff nodules, and increased size/of bilateral solid pulmonary nodules, consistent with worsening metastatic disease. 2. Aortic Atherosclerosis (ICD10-I70.0).  Assessment & Plan: Leah Diaz is a 62 y.o. woman with recurrent and progressive uterine leiomyosarcoma. CARIS: ER + 60%, PR + 50%, PDL1 5%, p53 mutation, HER2 0, BRCA 1/2 negative MSS, TMB low  Patient is overall doing well, asymptomatic with regard to her disease burden.  She is also recovered from nearly all of her chemotherapy-related side effects.  We discussed recent scan which shows progression of disease.  I reviewed again why surgery and radiation are not options for treatment of her multifocal disease.  In terms of treatment options, we discussed multiple systemic options including trabectedin, pazopanib, ifosfamide and gemcitabine doublet.  While her PD-L1 CPS was 5%, with TMB low tumor, I would favor one of the previously mentioned therapies over pembrolizumab.  Given recurrent nature of her disease and that our goal is palliative,  we spent a lot of time discussing maintaining quality of life while hopefully achieving some response or stable disease with treatment.  She is scheduled to start gemcitabine and Taxotere next week.  We reviewed in detail the option of trabectedin.  I gave her some printed material about trabectedin and we talked about more common side  effects.  We also discussed the option of clinical trial.  She is somewhat wary of not knowing what treatment she would get.  She is open to having me reach out to see if she is eligible for an in clinical trials at [facility].    At the end of our visit, she voiced plan likely to proceed with gemcitabine doublet treatment with consideration of trabectedin if she progresses or does not tolerate this regimen.  38 minutes of total time was spent for this patient encounter, including preparation, face-to-face counseling with the patient and coordination of care, and documentation of the encounter.  Eugene Garnet, MD  Division of Gynecologic Oncology  Department of Obstetrics and Gynecology  [facility]

## 2023-09-30 NOTE — Telephone Encounter (Signed)
Leah Diaz with appointment to see Dr. Pricilla Holm tomorrow at 12:45.

## 2023-10-01 ENCOUNTER — Inpatient Hospital Stay (HOSPITAL_BASED_OUTPATIENT_CLINIC_OR_DEPARTMENT_OTHER): Payer: Managed Care, Other (non HMO) | Admitting: Gynecologic Oncology

## 2023-10-01 ENCOUNTER — Encounter: Payer: Self-pay | Admitting: Gynecologic Oncology

## 2023-10-01 VITALS — BP 133/75 | HR 100 | Temp 98.3°F | Resp 20 | Wt 190.0 lb

## 2023-10-01 DIAGNOSIS — C7800 Secondary malignant neoplasm of unspecified lung: Secondary | ICD-10-CM | POA: Diagnosis not present

## 2023-10-01 DIAGNOSIS — C786 Secondary malignant neoplasm of retroperitoneum and peritoneum: Secondary | ICD-10-CM

## 2023-10-01 DIAGNOSIS — C55 Malignant neoplasm of uterus, part unspecified: Secondary | ICD-10-CM | POA: Diagnosis not present

## 2023-10-01 DIAGNOSIS — C549 Malignant neoplasm of corpus uteri, unspecified: Secondary | ICD-10-CM

## 2023-10-01 DIAGNOSIS — C801 Malignant (primary) neoplasm, unspecified: Secondary | ICD-10-CM

## 2023-10-01 DIAGNOSIS — Z7189 Other specified counseling: Secondary | ICD-10-CM

## 2023-10-01 DIAGNOSIS — Z5111 Encounter for antineoplastic chemotherapy: Secondary | ICD-10-CM | POA: Diagnosis not present

## 2023-10-01 DIAGNOSIS — C7982 Secondary malignant neoplasm of genital organs: Secondary | ICD-10-CM | POA: Diagnosis not present

## 2023-10-01 NOTE — Patient Instructions (Signed)
It was great to see you today.  I will let you know what I find out from [facility].  Please do not hesitate to reach out if you need anything.

## 2023-10-04 ENCOUNTER — Encounter: Payer: Self-pay | Admitting: Hematology and Oncology

## 2023-10-04 ENCOUNTER — Telehealth: Payer: Self-pay | Admitting: Oncology

## 2023-10-04 NOTE — Telephone Encounter (Signed)
[name] called and asked if her next appointments on 12/3 can be rescheduled to 12/5 with injection on Saturday because that is her busy week at work.  Advised I will check with Dr. Bertis Ruddy and let her know.

## 2023-10-05 ENCOUNTER — Encounter: Payer: Self-pay | Admitting: Hematology and Oncology

## 2023-10-05 ENCOUNTER — Inpatient Hospital Stay: Payer: Managed Care, Other (non HMO)

## 2023-10-05 ENCOUNTER — Inpatient Hospital Stay (HOSPITAL_BASED_OUTPATIENT_CLINIC_OR_DEPARTMENT_OTHER): Payer: Managed Care, Other (non HMO) | Admitting: Hematology and Oncology

## 2023-10-05 ENCOUNTER — Telehealth: Payer: Self-pay

## 2023-10-05 ENCOUNTER — Other Ambulatory Visit: Payer: Self-pay

## 2023-10-05 VITALS — BP 139/83 | HR 95 | Resp 18 | Ht 67.0 in | Wt 193.6 lb

## 2023-10-05 DIAGNOSIS — C549 Malignant neoplasm of corpus uteri, unspecified: Secondary | ICD-10-CM | POA: Diagnosis not present

## 2023-10-05 DIAGNOSIS — R600 Localized edema: Secondary | ICD-10-CM

## 2023-10-05 DIAGNOSIS — Z5111 Encounter for antineoplastic chemotherapy: Secondary | ICD-10-CM | POA: Diagnosis not present

## 2023-10-05 LAB — CMP (CANCER CENTER ONLY)
ALT: 11 U/L (ref 0–44)
AST: 15 U/L (ref 15–41)
Albumin: 3.6 g/dL (ref 3.5–5.0)
Alkaline Phosphatase: 99 U/L (ref 38–126)
Anion gap: 4 — ABNORMAL LOW (ref 5–15)
BUN: 11 mg/dL (ref 8–23)
CO2: 28 mmol/L (ref 22–32)
Calcium: 9.1 mg/dL (ref 8.9–10.3)
Chloride: 105 mmol/L (ref 98–111)
Creatinine: 0.55 mg/dL (ref 0.44–1.00)
GFR, Estimated: >60 mL/min (ref >60)
Glucose, Bld: 112 mg/dL — ABNORMAL HIGH (ref 70–99)
Potassium: 3.9 mmol/L (ref 3.5–5.1)
Sodium: 137 mmol/L (ref 135–145)
Total Bilirubin: 0.4 mg/dL (ref <1.2)
Total Protein: 6.4 g/dL — ABNORMAL LOW (ref 6.5–8.1)

## 2023-10-05 LAB — CBC WITH DIFFERENTIAL (CANCER CENTER ONLY)
Abs Immature Granulocytes: 0.02 10*3/uL (ref 0.00–0.07)
Basophils Absolute: 0.1 10*3/uL (ref 0.0–0.1)
Basophils Relative: 1 %
Eosinophils Absolute: 0.5 10*3/uL (ref 0.0–0.5)
Eosinophils Relative: 6 %
HCT: 36 % (ref 36.0–46.0)
Hemoglobin: 12.5 g/dL (ref 12.0–15.0)
Immature Granulocytes: 0 %
Lymphocytes Relative: 29 %
Lymphs Abs: 2.5 10*3/uL (ref 0.7–4.0)
MCH: 31.5 pg (ref 26.0–34.0)
MCHC: 34.7 g/dL (ref 30.0–36.0)
MCV: 90.7 fL (ref 80.0–100.0)
Monocytes Absolute: 0.8 10*3/uL (ref 0.1–1.0)
Monocytes Relative: 9 %
Neutro Abs: 4.7 10*3/uL (ref 1.7–7.7)
Neutrophils Relative %: 55 %
Platelet Count: 252 10*3/uL (ref 150–400)
RBC: 3.97 MIL/uL (ref 3.87–5.11)
RDW: 13.6 % (ref 11.5–15.5)
WBC Count: 8.6 10*3/uL (ref 4.0–10.5)
nRBC: 0 % (ref 0.0–0.2)

## 2023-10-05 MED ORDER — FUROSEMIDE 20 MG PO TABS: 20.0000 mg | ORAL_TABLET | ORAL | 3 refills | Status: DC | PRN

## 2023-10-05 MED ORDER — SODIUM CHLORIDE 0.9% FLUSH
10.0000 mL | Freq: Once | INTRAVENOUS | Status: AC
  Administered 2023-10-05: 10 mL

## 2023-10-05 MED ORDER — SODIUM CHLORIDE 0.9% FLUSH
10.0000 mL | INTRAVENOUS | Status: DC | PRN
  Administered 2023-10-05: 10 mL

## 2023-10-05 MED ORDER — PROCHLORPERAZINE MALEATE 10 MG PO TABS
10.0000 mg | ORAL_TABLET | Freq: Once | ORAL | Status: AC
  Administered 2023-10-05: 10 mg via ORAL
  Filled 2023-10-05: qty 1

## 2023-10-05 MED ORDER — LIDOCAINE-PRILOCAINE 2.5-2.5 % EX CREA: TOPICAL_CREAM | CUTANEOUS | 3 refills | Status: DC

## 2023-10-05 MED ORDER — SODIUM CHLORIDE 0.9 % IV SOLN: INTRAVENOUS | Status: DC

## 2023-10-05 MED ORDER — SODIUM CHLORIDE 0.9 % IV SOLN
600.0000 mg/m2 | Freq: Once | INTRAVENOUS | Status: AC
  Administered 2023-10-05: 1216 mg via INTRAVENOUS
  Filled 2023-10-05: qty 31.98

## 2023-10-05 MED ORDER — HEPARIN SOD (PORK) LOCK FLUSH 100 UNIT/ML IV SOLN
500.0000 [IU] | Freq: Once | INTRAVENOUS | Status: AC | PRN
  Administered 2023-10-05: 500 [IU]

## 2023-10-05 MED ORDER — PEGFILGRASTIM INJECTION 6 MG/0.6ML ~~LOC~~: 6.0000 mg | PREFILLED_SYRINGE | SUBCUTANEOUS | 0 refills | Status: DC

## 2023-10-05 NOTE — Patient Instructions (Signed)
 [facility] - A DEPT OF [facility]  Discharge Instructions: Thank you for choosing [facility] to provide your oncology and hematology care.   If you have a lab appointment with the Cancer Center, please go directly to the Cancer Center and check in at the registration area.   Wear comfortable clothing and clothing appropriate for easy access to any Portacath or PICC line.   We strive to give you quality time with your provider. You may need to reschedule your appointment if you arrive late (15 or more minutes).  Arriving late affects you and other patients whose appointments are after yours.  Also, if you miss three or more appointments without notifying the office, you may be dismissed from the clinic at the provider's discretion.      For prescription refill requests, have your pharmacy contact our office and allow 72 hours for refills to be completed.    Today you received the following chemotherapy and/or immunotherapy agents: Gemcitabine.       To help prevent nausea and vomiting after your treatment, we encourage you to take your nausea medication as directed.  BELOW ARE SYMPTOMS THAT SHOULD BE REPORTED IMMEDIATELY: *FEVER GREATER THAN 100.4 F (38 C) OR HIGHER *CHILLS OR SWEATING *NAUSEA AND VOMITING THAT IS NOT CONTROLLED WITH YOUR NAUSEA MEDICATION *UNUSUAL SHORTNESS OF BREATH *UNUSUAL BRUISING OR BLEEDING *URINARY PROBLEMS (pain or burning when urinating, or frequent urination) *BOWEL PROBLEMS (unusual diarrhea, constipation, pain near the anus) TENDERNESS IN MOUTH AND THROAT WITH OR WITHOUT PRESENCE OF ULCERS (sore throat, sores in mouth, or a toothache) UNUSUAL RASH, SWELLING OR PAIN  UNUSUAL VAGINAL DISCHARGE OR ITCHING   Items with * indicate a potential emergency and should be followed up as soon as possible or go to the Emergency Department if any problems should occur.  Please show the CHEMOTHERAPY ALERT CARD or  IMMUNOTHERAPY ALERT CARD at check-in to the Emergency Department and triage nurse.  Should you have questions after your visit or need to cancel or reschedule your appointment, please contact [facility] - A DEPT OF [facility]  Dept: [phone]  and follow the prompts.  Office hours are 8:00 a.m. to 4:30 p.m. Monday - Friday. Please note that voicemails left after 4:00 p.m. may not be returned until the following business day.  We are closed weekends and major holidays. You have access to a nurse at all times for urgent questions. Please call the main number to the clinic Dept: [phone] and follow the prompts.   For any non-urgent questions, you may also contact your provider using MyChart. We now offer e-Visits for anyone 57 and older to request care online for non-urgent symptoms. For details visit [email or web address].   Also download the MyChart app! Go to the app store, search "MyChart", open the app, select [place], and log in with your MyChart username and password.

## 2023-10-05 NOTE — Telephone Encounter (Signed)
Case manager with Leah Diaz called back. Leah Diaz will get Neulasta injection here on 12/7. After that her insurance ask that a Rx be sent to [facility] pharmacy to start injection at home on 1/4. Rx sent to pharmacy and Leah Diaz is aware.

## 2023-10-05 NOTE — Assessment & Plan Note (Signed)
Previously, we have done some molecular testing.  PD-L1 is only 3% positive.  She failed treatment with doxorubicin.  Despite tumor being 50% ER/PR positive, she has failed treatment with tamoxifen and Lupron. I explained to the patient why surgical intervention is not indicated.   We discussed risk and benefits of resumption of chemotherapy with gemcitabine with docetaxel versus Trabectedin and she agrees with resumption of chemo with gemcitabine and docetaxel Per insurance mandate, after her first cycle of chemo, she will receive future injections at home, to be arranged by her insurance company I plan to repeat imaging after 3 cycles

## 2023-10-05 NOTE — Telephone Encounter (Signed)
OK I am taking care of this right now

## 2023-10-05 NOTE — Telephone Encounter (Signed)
Returned call and left a message for a call back regarding neulasta injection. Rosann Auerbach is asking for a RX to be sent to pharmacy and they will provide home health is assist with injection.

## 2023-10-05 NOTE — Assessment & Plan Note (Signed)
She has no edema but is prone to edema due to steroids I refilled her prescription of lasix to take as needed

## 2023-10-05 NOTE — Progress Notes (Signed)
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  PD-L1 is only 3% positive.  She failed treatment with doxorubicin.  Despite tumor being 50% ER/PR positive, she has failed treatment with tamoxifen and Lupron. I explained to the patient why surgical intervention is not indicated.   We discussed risk and benefits of resumption of chemotherapy with gemcitabine with docetaxel versus Trabectedin and she agrees with resumption of chemo with gemcitabine and docetaxel Per insurance mandate, after her first cycle of chemo, she will receive future injections at home, to be arranged by her insurance company I plan to repeat imaging after 3 cycles     Bilateral leg edema She has no edema but is prone to edema due to steroids I refilled her prescription of lasix to take as needed  No orders of the defined types were placed in this encounter.   All questions were answered. The patient knows to call the clinic with any problems, questions or concerns. The total time spent in the appointment was 30 minutes encounter with patients including review of chart and various tests results, discussions about plan of care and coordination of care plan   Artis Delay, MD 10/05/2023 11:44 AM  INTERVAL HISTORY: Please see below for problem oriented charting. she returns for surveillance follow-up, seen prior to chemo I reviewed recommendations from GYN ONC She is doing well She requests her future appt to be moved to Thursdays Her insurance also required her GCSF to be given at home  REVIEW OF SYSTEMS:   Constitutional: Denies fevers, chills or abnormal weight loss Eyes:  Denies blurriness of vision Ears, nose, mouth, throat, and face: Denies mucositis or sore throat Respiratory: Denies cough, dyspnea or wheezes Cardiovascular: Denies palpitation, chest discomfort or lower extremity swelling Gastrointestinal:  Denies nausea, heartburn or change in bowel habits Skin: Denies abnormal skin rashes Lymphatics: Denies new lymphadenopathy or easy bruising Neurological:Denies numbness, tingling or new weaknesses Behavioral/Psych: Mood is stable, no new changes  All other systems were reviewed with the patient and are negative.  I have reviewed the past medical history, past surgical history, social history and family history with the patient and they are unchanged from previous note.  ALLERGIES:  has No Known Allergies.  MEDICATIONS:  Current Outpatient Medications  Medication Sig Dispense Refill   acetaminophen (TYLENOL) 500 MG tablet Take 500 mg by mouth every 6 (six) hours as needed for mild pain, moderate pain or fever.     aspirin 81 MG EC tablet Take by mouth daily.     Cholecalciferol (VITAMIN D3 PO) Take 1 tablet by mouth daily.     dexamethasone (DECADRON) 4 MG tablet Take 2 tabs by mouth 2 times daily starting day before chemo with Taxotere. Then take 2 tabs daily for 2 days starting day after chemo. Take with food. 30 tablet 1   furosemide (LASIX) 20 MG tablet Take 1 tablet (20 mg total) by mouth as needed for fluid or edema. Only takes 2-3 days per week 30 tablet 3   lidocaine-prilocaine (EMLA) cream Apply to affected area once 30 g 3   loratadine (CLARITIN) 10 MG tablet Take 10 mg by mouth daily.     LORazepam (ATIVAN) 0.5  MG tablet Take 1 tablet (0.5 mg total) by mouth 2 (two) times daily as needed for anxiety. 10 tablet 0   losartan (COZAAR) 50 MG tablet Take 1 tablet (50 mg total) by mouth daily. 90 tablet 1   metoprolol tartrate (LOPRESSOR) 25 MG tablet Take 1 tablet (25 mg total) by mouth 2 (two) times daily. 180 tablet 1   Multiple Vitamin  (MULTIVITAMIN WITH MINERALS) TABS tablet Take 1 tablet by mouth daily. 30 tablet 1   omeprazole (PRILOSEC) 20 MG capsule Take 1 capsule (20 mg total) by mouth 2 (two) times daily. 28 capsule 0   ondansetron (ZOFRAN) 8 MG tablet Take 1 tablet (8 mg total) by mouth every 8 (eight) hours as needed for nausea. 30 tablet 3   ondansetron (ZOFRAN) 8 MG tablet Take 1 tablet (8 mg total) by mouth every 8 (eight) hours as needed for nausea or vomiting. 30 tablet 1   prochlorperazine (COMPAZINE) 10 MG tablet Take 1 tablet (10 mg total) by mouth every 6 (six) hours as needed for nausea or vomiting. 30 tablet 1   No current facility-administered medications for this visit.   Facility-Administered Medications Ordered in Other Visits  Medication Dose Route Frequency Provider Last Rate Last Admin   0.9 %  sodium chloride infusion   Intravenous Continuous Penn Grissett, MD       0.9 %  sodium chloride infusion   Intravenous Continuous Bertis Ruddy, Dakota Vanwart, MD 10 mL/hr at 10/05/23 1025 New Bag at 10/05/23 1025   gemcitabine (GEMZAR) 1,216 mg in sodium chloride 0.9 % 250 mL chemo infusion  600 mg/m2 (Treatment Plan Recorded) Intravenous Once Artis Delay, MD 282 mL/hr at 10/05/23 1059 1,216 mg at 10/05/23 1059   heparin lock flush 100 unit/mL  500 Units Intracatheter Once PRN Bertis Ruddy, Javaya [place], MD       sodium chloride flush (NS) 0.9 % injection 10 mL  10 mL Intracatheter PRN Artis Delay, MD        SUMMARY OF ONCOLOGIC HISTORY: Oncology History Overview Note  High grade and LMS, 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 No extra uterine extension. No evidence of metastatic disease within the pelvis.   Mild  thickening of the endometrial stripe, likely related to entrapment by the adjacent uterine mass   06/03/2021 Initial Diagnosis   Uterine sarcoma (HCC)   06/03/2021 Cancer Staging   Staging form: Corpus Uteri - Sarcoma, AJCC 7th Edition - Clinical stage from 06/03/2021: FIGO Stage IVB (rT1b, N0, M1) - Signed by Artis Delay, MD on 03/31/2022 Diagnostic confirmation: Positive histology Stage prefix: Recurrence Biopsy of metastatic site performed: No Lymph-vascular invasion (LVI): LVI present/identified, NOS   06/03/2021 Pathology Results   FINAL MICROSCOPIC DIAGNOSIS:   A. UTERUS, CERVIX, BILATERAL FALLOPIAN TUBES AND OVARIES, HYSTERECTOMY:  - Uterus:       Mixed high grade uterine sarcoma, spanning 6 cm, see comment.       Extensive lymphovascular invasion.       See oncology table.  - Cervix: Benign squamous and endocervical mucosa. No dysplasia or  malignancy.  - Bilateral ovaries: Unremarkable. No malignancy.  - Bilateral fallopian tubes: Unremarkable. No malignancy.   ONCOLOGY TABLE:   UTERUS, SARCOMA: Resection   Procedure: Total hysterectomy and bilateral salpingo-oophorectomy  Specimen  Integrity: Focally disrupted on posterior surface  Tumor Site: Anterior wall  Tumor Size: 6 cm  Histologic Type: High grade sarcoma, mixed. See comment.  Other Tissue/ Organ Involvement: Not identified  Lymphovascular Invasion: Present, extensive.  Margins: All margins negative for tumor  Regional Lymph Nodes: Not applicable (no lymph nodes submitted or found)  Distant Metastasis:       Distant Site(s) Involved: Not applicable  Pathologic Stage Classification (pTNM, AJCC 8th Edition): pT1b, pN not  assigned  Ancillary Studies: Can be performed upon request  Representative Tumor Block: A6  Comment(s): The tumor has two morphologically different components. One component consists of malignant spindle cells which can be seen arising from the smooth muscle. These foci are positive for SMA,  desmin, cyclinD1 (focal), and CD10 (patchy). The other component consists of round to slightly spindle cells with abundant admixed vessels and occasional large pleomorphic cells. These areas are positive for SMA (patchy weak), desmin (focal), CD10 (variable with diffuse areas). Pancytokeratin is negative. 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   ADDENDUM:   PROGNOSTIC INDICATOR RESULTS:   Immunohistochemical and morphometric analysis performed manually    Estrogen Receptor:       POSITIVE, 50%, WEAK TO MODERATE STAINING  Progesterone Receptor:   POSITIVE, 50%, MODERATE TO STRONG STAINING   Reference Range Estrogen and Progesterone Receptor       Negative  0%       Positive  >1%    06/03/2021 Surgery   Surgeon: Quinn Axe    Assistants: Dr Antionette Char (an MD assistant was necessary for tissue manipulation, management of robotic instrumentation, retraction and positioning due to the complexity of the case and hospital policies).  Operation: Robotic-assisted laparoscopic total hysterectomy >250gm with bilateral salpingoophorectomy, minilaparotomy for specimen delivery   Surgeon: Quinn Axe    Operative Findings:  : Bulky 16cm uterus with intra-uterine mass (consistent with a fibroid-like mass) , smooth normal appearing serosa, no suspicious bulky nodes. Normal ovaries bilaterally. Normal upper abdomen. Uterus too large to deliver vaginally in tact.    06/18/2021 Imaging   1. Multiple bilateral pulmonary nodules, measuring up to 9 mm. Most of these are perifissural and subpleural in location, likely lymph nodes. Nevertheless, close follow-up recommended to exclude metastatic disease. 2. 11 mm hypoattenuating lesion towards the dome of the liver, indeterminate. MRI abdomen with and without contrast could be used to further evaluate as clinically warranted. 3. Aortic Atherosclerosis  (ICD10-I70.0).   10/13/2021 Imaging   1. No acute findings within the abdomen or pelvis. No specific findings identified to suggest residual or recurrence of tumor. 2. Stable small pulmonary nodules within the left lower lobe.   03/18/2022 Imaging   1. Interval development of 2 soft tissue nodules in the anterior pelvis measuring 10 mm and 7 mm respectively. Close follow-up recommended to exclude metastatic disease. PET-CT may be warranted to further evaluate. 2. Development of 2 mm left upper lobe parenchymal nodule, nonspecific. Close attention on follow-up imaging recommended  3. Stable appearance of index bilateral pulmonary nodules identified previously. 4. Aortic Atherosclerosis (ICD10-I70.0).   04/02/2022 PET scan   1. Signs of local tumor recurrence noted at the vaginal cuff. 2. 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 New since 10/13/2021 and progressive when compared with 03/17/2022. 3. New focal area of increased uptake within the right biceps femoris muscle which is suspicious for skeletal muscle involvement. 4. Stable appearance of small pulmonary  nodules described on 03/17/2022. These are too small to characterize by PET-CT.   04/09/2022 Procedure   Successful placement of a right IJ approach Power Port with ultrasound and fluoroscopic guidance. The catheter is ready for use.   04/09/2022 Echocardiogram    1. Left ventricular ejection fraction, by estimation, is 65 to 70%. The left ventricle has normal function. The left ventricle has no regional wall motion abnormalities. There is mild concentric left ventricular hypertrophy. Left ventricular diastolic parameters are consistent with Grade I diastolic dysfunction (impaired relaxation).  2. Right ventricular systolic function is normal. The right ventricular size is normal.  3. The mitral valve is normal in structure. No evidence of mitral valve regurgitation.  4. The aortic valve is  tricuspid. Aortic valve regurgitation is not visualized. No aortic stenosis is present.  5. The inferior vena cava is normal in size with greater than 50% respiratory variability, suggesting right atrial pressure of 3 mmHg.   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 Status post hysterectomy and bilateral oophorectomy. Multiple newand enlarged peritoneal nodules throughout the low abdomen and pelvis, previously FDG avid and consistent with worsened locally recurrent and peritoneal metastatic disease. 2. Multiple small bilateral pulmonary nodules, several of which are slightly enlarged compared to prior examination, consistent with worsened pulmonary metastatic disease.   Aortic Atherosclerosis (ICD10-I70.0).     06/25/2022 - 03/04/2023 Chemotherapy   Patient is on Treatment Plan : UTERINE UNDIFFERENTIATED LEIOMYOSARCOMA Gemcitabine D1,8 + Docetaxel D8 (900/100) q21d     09/02/2022 Imaging   1. Moderate response to therapy of peritoneal metastasis within the anterior pelvis. 2. Minimal response to therapy of pulmonary metastasis. 3. No new or progressive disease. 4.  Aortic Atherosclerosis (ICD10-I70.0).     12/01/2022 Imaging   1. No new or progressive findings in the chest, abdomen, or pelvis. 2. Peritoneal metastases in the pelvis described previously measure smaller today compatible with interval response to therapy. 3. Stable tiny bilateral pulmonary nodules. Previously characterized as metastatic lesions, these are unchanged in the interval. No new suspicious pulmonary nodule or mass. 4.  Aortic Atherosclerois (ICD10-170.0)   03/04/2023 Imaging   CT CHEST ABDOMEN PELVIS W CONTRAST  Result Date: 03/03/2023 CLINICAL DATA:  Cervical cancer * Tracking Code: BO *. Assess treatment response. Current chemotherapy. Prior total hysterectomy EXAM: CT CHEST, ABDOMEN, AND PELVIS WITH CONTRAST  TECHNIQUE: Multidetector CT imaging of the chest, abdomen and pelvis was performed following the standard protocol during bolus administration of intravenous contrast. RADIATION DOSE REDUCTION: This exam was performed according to the departmental dose-optimization program which includes automated exposure control, adjustment of the mA and/or kV according to patient size and/or use of iterative reconstruction technique. CONTRAST:  OMNIPAQUE IOHEXOL 300 MG/ML  SOLN COMPARISON:  CT 12/01/2022 and older FINDINGS: CT CHEST FINDINGS Cardiovascular: Right upper chest port. Heart is nonenlarged. Trace pericardial fluid. Normal caliber thoracic aorta. Mediastinum/Nodes: No specific abnormal lymph node enlargement seen in the axillary region, hilum or mediastinum. Mildly patulous thoracic esophagus. Thyroid gland is unremarkable. Lungs/Pleura: Tiny pleural effusions are seen, left-greater-than-right. These new from previous. There is some patchy parenchymal opacity identified in the superior segment of the lower lobes, left-greater-than-right. This could be infiltrative rather than neoplastic. Recommend short follow-up. There were some small nodules identified on the prior. For example 3 mm nodule lateral left lower lobe subpleural on series 7, image 91 is stable. Right  lower lobe subpleural nodule on the prior which measured 9 x 6 mm, today is stable on image 73 of series 7. Stable 2-3 mm nodule right lower lobe on series 7, image 97. Musculoskeletal: Curvature of the spine with some degenerative changes. CT ABDOMEN PELVIS FINDINGS Hepatobiliary: Stable dome hepatic cyst. No new space-occupying lesion in the liver. Patent portal vein. Gallbladder is distended. Pancreas: Unremarkable. No pancreatic ductal dilatation or surrounding inflammatory changes. Spleen: Spleen is slightly enlarged at 13.1 cm in AP length. Preserved enhancement. Small splenule inferiorly. Adrenals/Urinary Tract: The adrenal glands are preserved.  Tiny low-attenuation cystic lesions are seen which are too small to completely characterize along the kidneys. Bosniak 2 lesions. No specific imaging follow-up. The ureters have normal course and caliber down to the bladder. Bladder wall is diffusely thickened. The bladder is contracted. There is significant stranding. This is progressive from the previous examination. Stomach/Bowel: Large bowel has a normal course and caliber with scattered stool. Overall moderate stool burden. Normal appendix extends inferior to the cecum in the right lower quadrant. The stomach and small bowel are nondilated. Vascular/Lymphatic: Normal caliber aorta and IVC with some vascular calcifications. No specific abnormal lymph node enlargement identified in the abdomen and pelvis. Reproductive: Absence of the uterus and ovaries. Other: Once again there is a pelvic soft tissue nodule anteriorly on series 2, image 104 which on the prior measured 2.1 x 1.6 cm and today 1.9 x 1.3 cm. Smaller foci more caudal on axial image 111 and 112 are stable. Musculoskeletal: Curvature and degenerative changes are seen along the spine. Degenerative changes along the pelvis. IMPRESSION: Stable pelvic peritoneal nodules. No new lymph node enlargement seen in the chest, abdomen or pelvis. New tiny pleural effusions, left-greater-than-right with some ill-defined parenchymal opacities along the lower lobes. Although there is a differential this could be infectious or inflammatory. Recommend short follow-up and correlation to specific symptoms. Previous tiny lung nodules are stable. Continued follow-up surveillance as per the patient's neoplasm. Mild splenomegaly. Worsening bladder wall thickening and stranding. Please correlate with symptoms and treatment Electronically Signed   By: Karen Kays M.D.   On: 03/03/2023 18:23      04/05/2023 Imaging   Normal bone density scan   06/21/2023 Imaging   CT CHEST ABDOMEN PELVIS W CONTRAST  Result Date:  06/20/2023 CLINICAL DATA:  Endometrial cancer restaging, chemotherapy complete, ongoing oral chemotherapy * Tracking Code: BO * EXAM: CT CHEST, ABDOMEN, AND PELVIS WITH CONTRAST TECHNIQUE: Multidetector CT imaging of the chest, abdomen and pelvis was performed following the standard protocol during bolus administration of intravenous contrast. RADIATION DOSE REDUCTION: This exam was performed according to the departmental dose-optimization program which includes automated exposure control, adjustment of the mA and/or kV according to patient size and/or use of iterative reconstruction technique. CONTRAST:  OMNIPAQUE IOHEXOL 300 MG/ML  SOLN COMPARISON:  CT chest angiogram, 03/23/2023, CT chest abdomen pelvis, 03/02/2023 FINDINGS: CT CHEST FINDINGS Cardiovascular: No significant vascular findings. Normal heart size. No pericardial effusion. Mediastinum/Nodes: No enlarged mediastinal, hilar, or axillary lymph nodes. Thyroid gland, trachea, and esophagus demonstrate no significant findings. Lungs/Pleura: Interval resolution of previously seen pleural effusions. Significant interval improvement of previously seen heterogeneous and ground-glass airspace opacity, with small residual opacities present in the lower lobes (series 4, image 68). Multiple small subpleural nodules are unchanged, largest in the dependent right lower lobe measuring 0.9 cm (series 4, image 75). Musculoskeletal: No chest wall abnormality. No acute osseous findings. CT ABDOMEN PELVIS FINDINGS Hepatobiliary: No solid liver abnormality  is seen. Benign simple cyst of the liver dome, requiring no further follow-up or characterization (series 2, image 45). No gallstones, gallbladder wall thickening, or biliary dilatation. Pancreas: Unremarkable. No pancreatic ductal dilatation or surrounding inflammatory changes. Spleen: Normal in size without significant abnormality. Adrenals/Urinary Tract: Adrenal glands are unremarkable. Simple, benign right renal  cortical cysts, for which no further follow-up or characterization is required. Kidneys are otherwise normal, without renal calculi, solid lesion, or hydronephrosis. Bladder is unremarkable. Stomach/Bowel: Stomach is within normal limits. Appendix appears normal. No evidence of bowel wall thickening, distention, or inflammatory changes. Moderate burden of stool throughout the colon and rectum. Vascular/Lymphatic: No significant vascular findings are present. No enlarged abdominal or pelvic lymph nodes. Reproductive: Status post hysterectomy. Other: No abdominal wall hernia or abnormality. No ascites. Largest soft tissue nodule in the low midline abdomen is increased in size, measuring 2.8 x 2.5 cm, previously 2.4 x 2.1 cm (series 2, image 105). Multiple additional soft tissue nodules throughout the pelvis are unchanged, largest in the anterior right pelvis measuring 1.6 x 1.2 cm (series 2, image 112). Additional nodule at the right aspect of the vaginal cuff measures 2.0 x 1.0 cm (series 2, image 112). Musculoskeletal: No acute osseous findings. IMPRESSION: 1. Largest peritoneal soft tissue nodule in the low midline abdomen is increased in size, consistent with worsened peritoneal metastatic disease. Multiple additional soft tissue nodules throughout the pelvis are unchanged. 2. 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 3. Interval resolution of previously seen pleural effusions. 4. Significant interval improvement of previously seen heterogeneous and ground-glass airspace opacity, with small residual opacities present in the lower lobes. Findings are consistent with improved nonspecific infection or inflammation. 5. Status post hysterectomy. Electronically Signed   By: Jearld Lesch M.D.   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 * Tracking Code: BO *  EXAM: CT CHEST, ABDOMEN, AND PELVIS WITH CONTRAST TECHNIQUE: Multidetector CT imaging of the chest, abdomen and pelvis was performed following the standard protocol during bolus administration of intravenous contrast. RADIATION DOSE REDUCTION: This exam was performed according to the departmental dose-optimization program which includes automated exposure control, adjustment of the mA and/or kV according to patient size and/or use of iterative reconstruction technique. CONTRAST:  OMNIPAQUE IOHEXOL 300 MG/ML  SOLN COMPARISON:  Multiple priors including most recent CT June 18, 2023 FINDINGS: CT CHEST FINDINGS Cardiovascular: Aortic atherosclerosis. Accessed right chest Port-A-Cath with tip at the superior cavoatrial junction. No central pulmonary embolus on this nondedicated study. Normal size heart. No significant pericardial effusion/thickening. Mediastinum/Nodes: No suspicious thyroid nodule. No pathologically enlarged mediastinal, hilar or axillary lymph nodes. Small hiatal hernia. Lungs/Pleura: Bilateral solid pulmonary nodules, some of which are stable in size others are increased in size. For reference: -left upper lobe pulmonary nodule measuring 6 mm on image 77/2 previously measured 3 mm. -subpleural right lower lobe pulmonary nodule measures 9 mm on image 77/7, unchanged. A few new scattered tiny pulmonary nodules for instance a 3 mm pulmonary nodule in the right lower lobe on image 87/7. Musculoskeletal: No aggressive lytic or blastic lesion of bone. Multilevel degenerative changes spine. CT ABDOMEN PELVIS FINDINGS Hepatobiliary: Stable 9 mm hypodense lesion in the dome of the liver previously characterized as a cyst on MRI July 04, 2021. No suspicious hepatic lesion. Gallbladder is unremarkable. No biliary ductal dilation. Pancreas: No pancreatic ductal dilation or  evidence of acute inflammation. Spleen: No splenomegaly. Adrenals/Urinary Tract: Bilateral adrenal glands appear normal. No  hydronephrosis. Stomach/Bowel: No radiopaque enteric contrast material was administered. Stomach is unremarkable for degree of distension. Normal appendix. No pathologic dilation of small or large bowel. Vascular/Lymphatic: Aortic atherosclerosis. Normal caliber abdominal aorta. Smooth IVC contours. The portal, splenic and superior mesenteric veins are patent. No pathologically enlarged abdominal or pelvic lymph nodes. Reproductive: Prior hysterectomy with increased size of the vaginal cuff nodules previously indexed nodule in the right vaginal cuff measures 3.2 cm on image 116/2 previously 2.0 cm. Other: Increased size and number of peritoneal/omental soft tissue implants. For reference: -previously indexed soft tissue nodule in the anterior pelvis now measures 5.7 x 3.6 cm on image 105/2 previously 2.8 x 2.5 cm. -new pelvic soft tissue nodule measures 2.3 cm on image 106/2. Trace free fluid in the pelvis with a new walled-off pelvic fluid collection measuring 3.9 cm adjacent to a soft tissue implant on image 105/2. Musculoskeletal: No aggressive lytic or blastic lesion of bone. IMPRESSION: 1. Worsening peritoneal carcinomatosis, increased size of the vaginal cuff nodules, and increased size/of bilateral solid pulmonary nodules, consistent with worsening metastatic disease. 2. Aortic Atherosclerosis (ICD10-I70.0). Electronically Signed   By: Maudry Mayhew M.D.   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       PHYSICAL EXAMINATION: ECOG PERFORMANCE STATUS: 1 - Symptomatic but completely ambulatory  Vitals:   10/05/23 0936  BP: 139/83  Pulse: 95  Resp: 18  SpO2: 98%   Filed Weights   10/05/23 0936  Weight: 193 lb 9.6 oz (87.8 kg)    GENERAL:alert, no distress and comfortable   LABORATORY DATA:  I have reviewed the data as listed    Component Value Date/Time    NA 137 10/05/2023 0912   K 3.9 10/05/2023 0912   CL 105 10/05/2023 0912   CO2 28 10/05/2023 0912   GLUCOSE 112 (H) 10/05/2023 0912   BUN 11 10/05/2023 0912   CREATININE 0.55 10/05/2023 0912   CALCIUM 9.1 10/05/2023 0912   PROT 6.4 (L) 10/05/2023 0912   ALBUMIN 3.6 10/05/2023 0912   AST 15 10/05/2023 0912   ALT 11 10/05/2023 0912   ALKPHOS 99 10/05/2023 0912   BILITOT 0.4 10/05/2023 0912   GFRNONAA >60 10/05/2023 0912    No results found for: "SPEP", "UPEP"  Lab Results  Component Value Date   WBC 8.6 10/05/2023   NEUTROABS 4.7 10/05/2023   HGB 12.5 10/05/2023   HCT 36.0 10/05/2023   MCV 90.7 10/05/2023   PLT 252 10/05/2023      Chemistry      Component Value Date/Time   NA 137 10/05/2023 0912   K 3.9 10/05/2023 0912   CL 105 10/05/2023 0912   CO2 28 10/05/2023 0912   BUN 11 10/05/2023 0912   CREATININE 0.55 10/05/2023 0912      Component Value Date/Time   CALCIUM 9.1 10/05/2023 0912   ALKPHOS 99 10/05/2023 0912   AST 15 10/05/2023 0912   ALT 11 10/05/2023 0912   BILITOT 0.4 10/05/2023 0912       RADIOGRAPHIC STUDIES: I have personally reviewed the radiological images as listed and agreed with the findings in the report. 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 * Tracking Code: BO * EXAM: CT  CHEST, ABDOMEN, AND PELVIS WITH CONTRAST TECHNIQUE: Multidetector CT imaging of the chest, abdomen and pelvis was performed following the standard protocol during bolus administration of intravenous contrast. RADIATION DOSE REDUCTION: This exam was performed according to the departmental dose-optimization program which includes automated exposure control, adjustment of the mA and/or kV according to patient size and/or use of iterative reconstruction technique. CONTRAST:  OMNIPAQUE IOHEXOL 300 MG/ML  SOLN COMPARISON:  Multiple priors including most recent CT June 18, 2023 FINDINGS: CT  CHEST FINDINGS Cardiovascular: Aortic atherosclerosis. Accessed right chest Port-A-Cath with tip at the superior cavoatrial junction. No central pulmonary embolus on this nondedicated study. Normal size heart. No significant pericardial effusion/thickening. Mediastinum/Nodes: No suspicious thyroid nodule. No pathologically enlarged mediastinal, hilar or axillary lymph nodes. Small hiatal hernia. Lungs/Pleura: Bilateral solid pulmonary nodules, some of which are stable in size others are increased in size. For reference: -left upper lobe pulmonary nodule measuring 6 mm on image 77/2 previously measured 3 mm. -subpleural right lower lobe pulmonary nodule measures 9 mm on image 77/7, unchanged. A few new scattered tiny pulmonary nodules for instance a 3 mm pulmonary nodule in the right lower lobe on image 87/7. Musculoskeletal: No aggressive lytic or blastic lesion of bone. Multilevel degenerative changes spine. CT ABDOMEN PELVIS FINDINGS Hepatobiliary: Stable 9 mm hypodense lesion in the dome of the liver previously characterized as a cyst on MRI July 04, 2021. No suspicious hepatic lesion. Gallbladder is unremarkable. No biliary ductal dilation. Pancreas: No pancreatic ductal dilation or evidence of acute inflammation. Spleen: No splenomegaly. Adrenals/Urinary Tract: Bilateral adrenal glands appear normal. No hydronephrosis. Stomach/Bowel: No radiopaque enteric contrast material was administered. Stomach is unremarkable for degree of distension. Normal appendix. No pathologic dilation of small or large bowel. Vascular/Lymphatic: Aortic atherosclerosis. Normal caliber abdominal aorta. Smooth IVC contours. The portal, splenic and superior mesenteric veins are patent. No pathologically enlarged abdominal or pelvic lymph nodes. Reproductive: Prior hysterectomy with increased size of the vaginal cuff nodules previously indexed nodule in the right vaginal cuff measures 3.2 cm on image 116/2 previously 2.0 cm. Other:  Increased size and number of peritoneal/omental soft tissue implants. For reference: -previously indexed soft tissue nodule in the anterior pelvis now measures 5.7 x 3.6 cm on image 105/2 previously 2.8 x 2.5 cm. -new pelvic soft tissue nodule measures 2.3 cm on image 106/2. Trace free fluid in the pelvis with a new walled-off pelvic fluid collection measuring 3.9 cm adjacent to a soft tissue implant on image 105/2. Musculoskeletal: No aggressive lytic or blastic lesion of bone. IMPRESSION: 1. Worsening peritoneal carcinomatosis, increased size of the vaginal cuff nodules, and increased size/of bilateral solid pulmonary nodules, consistent with worsening metastatic disease. 2. Aortic Atherosclerosis (ICD10-I70.0). Electronically Signed   By: Maudry Mayhew M.D.   On: 09/19/2023 12:38

## 2023-10-07 ENCOUNTER — Encounter: Payer: Self-pay | Admitting: Hematology and Oncology

## 2023-10-08 ENCOUNTER — Other Ambulatory Visit: Payer: Self-pay

## 2023-10-08 ENCOUNTER — Encounter: Payer: Self-pay | Admitting: Hematology and Oncology

## 2023-10-09 ENCOUNTER — Other Ambulatory Visit: Payer: Self-pay

## 2023-10-13 ENCOUNTER — Telehealth: Payer: Self-pay | Admitting: Hematology and Oncology

## 2023-10-13 NOTE — Telephone Encounter (Signed)
Called to schedule genetic counseling.  Spoke to the Ms. Thurow, but she was busy cooking and did not have time to schedule appointment.  She will call back when she has time.

## 2023-10-14 ENCOUNTER — Other Ambulatory Visit: Payer: Self-pay

## 2023-10-19 ENCOUNTER — Ambulatory Visit: Payer: Managed Care, Other (non HMO) | Admitting: Hematology and Oncology

## 2023-10-19 ENCOUNTER — Ambulatory Visit: Payer: Managed Care, Other (non HMO)

## 2023-10-19 ENCOUNTER — Other Ambulatory Visit: Payer: Managed Care, Other (non HMO)

## 2023-10-21 ENCOUNTER — Inpatient Hospital Stay: Payer: Managed Care, Other (non HMO) | Attending: Gynecologic Oncology

## 2023-10-21 ENCOUNTER — Inpatient Hospital Stay: Payer: Managed Care, Other (non HMO) | Admitting: Hematology and Oncology

## 2023-10-21 ENCOUNTER — Encounter: Payer: Self-pay | Admitting: Hematology and Oncology

## 2023-10-21 ENCOUNTER — Inpatient Hospital Stay: Payer: Managed Care, Other (non HMO)

## 2023-10-21 ENCOUNTER — Other Ambulatory Visit: Payer: Self-pay | Admitting: Hematology and Oncology

## 2023-10-21 ENCOUNTER — Ambulatory Visit: Payer: Managed Care, Other (non HMO)

## 2023-10-21 VITALS — BP 133/71 | HR 97 | Temp 99.3°F | Resp 18 | Ht 67.0 in | Wt 192.2 lb

## 2023-10-21 DIAGNOSIS — C549 Malignant neoplasm of corpus uteri, unspecified: Secondary | ICD-10-CM

## 2023-10-21 DIAGNOSIS — Z170 Estrogen receptor positive status [ER+]: Secondary | ICD-10-CM | POA: Diagnosis not present

## 2023-10-21 DIAGNOSIS — Z90710 Acquired absence of both cervix and uterus: Secondary | ICD-10-CM | POA: Insufficient documentation

## 2023-10-21 DIAGNOSIS — R600 Localized edema: Secondary | ICD-10-CM | POA: Insufficient documentation

## 2023-10-21 DIAGNOSIS — T451X5A Adverse effect of antineoplastic and immunosuppressive drugs, initial encounter: Secondary | ICD-10-CM

## 2023-10-21 DIAGNOSIS — Z5111 Encounter for antineoplastic chemotherapy: Secondary | ICD-10-CM | POA: Diagnosis present

## 2023-10-21 DIAGNOSIS — Z90722 Acquired absence of ovaries, bilateral: Secondary | ICD-10-CM | POA: Diagnosis not present

## 2023-10-21 DIAGNOSIS — Z5189 Encounter for other specified aftercare: Secondary | ICD-10-CM | POA: Insufficient documentation

## 2023-10-21 DIAGNOSIS — Z1721 Progesterone receptor positive status: Secondary | ICD-10-CM | POA: Diagnosis not present

## 2023-10-21 DIAGNOSIS — C55 Malignant neoplasm of uterus, part unspecified: Secondary | ICD-10-CM | POA: Diagnosis not present

## 2023-10-21 DIAGNOSIS — M8580 Other specified disorders of bone density and structure, unspecified site: Secondary | ICD-10-CM

## 2023-10-21 LAB — CBC WITH DIFFERENTIAL/PLATELET
Abs Immature Granulocytes: 0.05 10*3/uL (ref 0.00–0.07)
Basophils Absolute: 0.1 10*3/uL (ref 0.0–0.1)
Basophils Relative: 1 %
Eosinophils Absolute: 0.1 10*3/uL (ref 0.0–0.5)
Eosinophils Relative: 1 %
HCT: 36.2 % (ref 36.0–46.0)
Hemoglobin: 12.4 g/dL (ref 12.0–15.0)
Immature Granulocytes: 0 %
Lymphocytes Relative: 21 %
Lymphs Abs: 2.6 10*3/uL (ref 0.7–4.0)
MCH: 32 pg (ref 26.0–34.0)
MCHC: 34.3 g/dL (ref 30.0–36.0)
MCV: 93.3 fL (ref 80.0–100.0)
Monocytes Absolute: 1 10*3/uL (ref 0.1–1.0)
Monocytes Relative: 8 %
Neutro Abs: 8.7 10*3/uL — ABNORMAL HIGH (ref 1.7–7.7)
Neutrophils Relative %: 69 %
Platelets: 422 10*3/uL — ABNORMAL HIGH (ref 150–400)
RBC: 3.88 MIL/uL (ref 3.87–5.11)
RDW: 14.7 % (ref 11.5–15.5)
WBC: 12.6 10*3/uL — ABNORMAL HIGH (ref 4.0–10.5)
nRBC: 0 % (ref 0.0–0.2)

## 2023-10-21 LAB — COMPREHENSIVE METABOLIC PANEL
ALT: 17 U/L (ref 0–44)
AST: 18 U/L (ref 15–41)
Albumin: 3.5 g/dL (ref 3.5–5.0)
Alkaline Phosphatase: 98 U/L (ref 38–126)
Anion gap: 7 (ref 5–15)
BUN: 16 mg/dL (ref 8–23)
CO2: 26 mmol/L (ref 22–32)
Calcium: 9.5 mg/dL (ref 8.9–10.3)
Chloride: 104 mmol/L (ref 98–111)
Creatinine, Ser: 0.61 mg/dL (ref 0.44–1.00)
GFR, Estimated: >60 mL/min (ref >60)
Glucose, Bld: 109 mg/dL — ABNORMAL HIGH (ref 70–99)
Potassium: 4.1 mmol/L (ref 3.5–5.1)
Sodium: 137 mmol/L (ref 135–145)
Total Bilirubin: 0.3 mg/dL (ref <1.2)
Total Protein: 6.4 g/dL — ABNORMAL LOW (ref 6.5–8.1)

## 2023-10-21 MED ORDER — SODIUM CHLORIDE 0.9% FLUSH
10.0000 mL | INTRAVENOUS | Status: DC | PRN
  Administered 2023-10-21: 10 mL

## 2023-10-21 MED ORDER — SODIUM CHLORIDE 0.9 % IV SOLN
600.0000 mg/m2 | Freq: Once | INTRAVENOUS | Status: AC
Start: 2023-10-21 — End: 2023-10-21
  Administered 2023-10-21: 1216 mg via INTRAVENOUS
  Filled 2023-10-21: qty 31.98

## 2023-10-21 MED ORDER — PROCHLORPERAZINE MALEATE 10 MG PO TABS
10.0000 mg | ORAL_TABLET | Freq: Once | ORAL | Status: AC
  Administered 2023-10-21: 10 mg via ORAL
  Filled 2023-10-21: qty 1

## 2023-10-21 MED ORDER — HEPARIN SOD (PORK) LOCK FLUSH 100 UNIT/ML IV SOLN
250.0000 [IU] | Freq: Once | INTRAVENOUS | Status: AC
  Administered 2023-10-21: 250 [IU]

## 2023-10-21 MED ORDER — SODIUM CHLORIDE 0.9 % IV SOLN
60.0000 mg/m2 | Freq: Once | INTRAVENOUS | Status: AC
  Administered 2023-10-21: 121 mg via INTRAVENOUS
  Filled 2023-10-21: qty 12.1

## 2023-10-21 MED ORDER — SODIUM CHLORIDE 0.9% FLUSH
10.0000 mL | Freq: Once | INTRAVENOUS | Status: AC
  Administered 2023-10-21: 10 mL

## 2023-10-21 MED ORDER — DEXAMETHASONE SODIUM PHOSPHATE 10 MG/ML IJ SOLN
10.0000 mg | Freq: Once | INTRAMUSCULAR | Status: AC
Start: 2023-10-21 — End: 2023-10-21
  Administered 2023-10-21: 10 mg via INTRAVENOUS
  Filled 2023-10-21: qty 1

## 2023-10-21 MED ORDER — SODIUM CHLORIDE 0.9 % IV SOLN
INTRAVENOUS | Status: DC
Start: 2023-10-21 — End: 2023-10-21

## 2023-10-21 MED ORDER — HEPARIN SOD (PORK) LOCK FLUSH 100 UNIT/ML IV SOLN
500.0000 [IU] | Freq: Once | INTRAVENOUS | Status: AC | PRN
  Administered 2023-10-21: 500 [IU]

## 2023-10-21 NOTE — Assessment & Plan Note (Signed)
So far, she tolerated treatment well without major side effects Her blood count is stable We will proceed with treatment as scheduled

## 2023-10-21 NOTE — Patient Instructions (Signed)
[facility] - A DEPT OF [facility]  Discharge Instructions: Thank you for choosing [facility] to provide your oncology and hematology care.   If you have a lab appointment with the Cancer Center, please go directly to the Cancer Center and check in at the registration area.   Wear comfortable clothing and clothing appropriate for easy access to any Portacath or PICC line.   We strive to give you quality time with your provider. You may need to reschedule your appointment if you arrive late (15 or more minutes).  Arriving late affects you and other patients whose appointments are after yours.  Also, if you miss three or more appointments without notifying the office, you may be dismissed from the clinic at the provider's discretion.      For prescription refill requests, have your pharmacy contact our office and allow 72 hours for refills to be completed.    Today you received the following chemotherapy and/or immunotherapy agents: Gemzar, Taxotere      To help prevent nausea and vomiting after your treatment, we encourage you to take your nausea medication as directed.  BELOW ARE SYMPTOMS THAT SHOULD BE REPORTED IMMEDIATELY: *FEVER GREATER THAN 100.4 F (38 C) OR HIGHER *CHILLS OR SWEATING *NAUSEA AND VOMITING THAT IS NOT CONTROLLED WITH YOUR NAUSEA MEDICATION *UNUSUAL SHORTNESS OF BREATH *UNUSUAL BRUISING OR BLEEDING *URINARY PROBLEMS (pain or burning when urinating, or frequent urination) *BOWEL PROBLEMS (unusual diarrhea, constipation, pain near the anus) TENDERNESS IN MOUTH AND THROAT WITH OR WITHOUT PRESENCE OF ULCERS (sore throat, sores in mouth, or a toothache) UNUSUAL RASH, SWELLING OR PAIN  UNUSUAL VAGINAL DISCHARGE OR ITCHING   Items with * indicate a potential emergency and should be followed up as soon as possible or go to the Emergency Department if any problems should occur.  Please show the CHEMOTHERAPY ALERT CARD or  IMMUNOTHERAPY ALERT CARD at check-in to the Emergency Department and triage nurse.  Should you have questions after your visit or need to cancel or reschedule your appointment, please contact [facility] - [facility] DE[facility]  Dept: [phone]  and follow the prompts.  Office hours are 8:00 a.m. to 4:30 p.m. Monday - Friday. Please note that voicemails left after 4:00 p.m. may not be returned until the following business day.  We are closed weekends and major holidays. You have access to a nurse at all times for urgent questions. Please call the main number to the clinic Dept: [phone] and follow the prompts.   For any non-urgent questions, you may also contact your provider using MyChart. We now offer e-Visits for anyone 22 and older to request care online for non-urgent symptoms. For details visit [email or web address].   Also download the MyChart app! Go to the app store, search "MyChart", open the app, select [facility], and log in with your MyChart username and password.

## 2023-10-21 NOTE — Assessment & Plan Note (Deleted)
This is likely due to recent treatment. The patient denies recent history of bleeding such as epistaxis, hematuria or hematochezia. She is asymptomatic from the anemia. I will observe for now.  She does not require transfusion now. I will continue the chemotherapy at current dose without dosage adjustment.  If the anemia gets progressive worse in the future, I might have to delay her treatment or adjust the chemotherapy dose.  

## 2023-10-21 NOTE — Assessment & Plan Note (Signed)
She is prone to get bilateral lower extremity edema with steroids and Taxotere She has furosemide to take as needed

## 2023-10-21 NOTE — Progress Notes (Signed)
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   All questions were answered. The patient knows to call the clinic with any problems, questions or concerns. The total time spent in the appointment was 20 minutes encounter with patients including review of chart and various tests results, discussions about plan of care and coordination of care plan   Artis Delay, MD 10/21/2023 8:57 AM  INTERVAL HISTORY: Please see below for problem oriented charting. she returns for chemotherapy and follow-up She is doing well She denies side effects of treatment Denies major fluid retention No signs of neuropathy or mucositis She will get G-CSF support this cycle but future cycle she will get it at home through her insurance company  REVIEW OF SYSTEMS:   Constitutional: Denies fevers, chills or abnormal weight loss Eyes: Denies blurriness of vision Ears, nose, mouth, throat, and face: Denies mucositis or sore throat Respiratory: Denies cough, dyspnea or wheezes Cardiovascular: Denies palpitation, chest discomfort or lower extremity swelling Gastrointestinal:  Denies nausea, heartburn or change in bowel habits Skin: Denies abnormal skin rashes Lymphatics: Denies new lymphadenopathy or easy bruising Neurological:Denies numbness, tingling or new weaknesses Behavioral/Psych: Mood is  stable, no new changes  All other systems were reviewed with the patient and are negative.  I have reviewed the past medical history, past surgical history, social history and family history with the patient and they are unchanged from previous note.  ALLERGIES:  has No Known Allergies.  MEDICATIONS:  Current Outpatient Medications  Medication Sig Dispense Refill   acetaminophen (TYLENOL) 500 MG tablet Take 500 mg by mouth every 6 (six) hours as needed for mild pain, moderate pain or fever.     aspirin 81 MG EC tablet Take by mouth daily.     Cholecalciferol (VITAMIN D3 PO) Take 1 tablet by mouth daily.     dexamethasone (DECADRON) 4 MG tablet Take 2 tabs by mouth 2 times daily starting day before chemo with Taxotere. Then take 2 tabs daily for 2 days starting day after chemo. Take with food. 30 tablet 1   furosemide (LASIX) 20 MG tablet Take 1 tablet (20 mg total) by mouth as needed for fluid or edema. Only takes 2-3 days per week 30 tablet 3   lidocaine-prilocaine (EMLA) cream Apply to affected area once 30 g 3   loratadine (CLARITIN) 10 MG tablet Take 10 mg by mouth daily.     LORazepam (ATIVAN) 0.5 MG tablet Take 1 tablet (0.5 mg total) by mouth 2 (two) times daily as needed for anxiety. 10 tablet 0   losartan (COZAAR) 50 MG tablet Take 1 tablet (50 mg total) by mouth daily. 90 tablet 1   metoprolol tartrate (LOPRESSOR) 25 MG tablet Take 1 tablet (25 mg total) by mouth 2 (two) times daily. 180 tablet 1   Multiple Vitamin (MULTIVITAMIN WITH MINERALS) TABS tablet Take 1 tablet by mouth daily. 30 tablet 1  omeprazole (PRILOSEC) 20 MG capsule Take 1 capsule (20 mg total) by mouth 2 (two) times daily. 28 capsule 0   ondansetron (ZOFRAN) 8 MG tablet Take 1 tablet (8 mg total) by mouth every 8 (eight) hours as needed for nausea. 30 tablet 3   ondansetron (ZOFRAN) 8 MG tablet Take 1 tablet (8 mg total) by mouth every 8 (eight) hours as needed for nausea or vomiting. 30 tablet 1   pegfilgrastim  (NEULASTA) 6 MG/0.6ML injection Inject 0.6 mLs (6 mg total) into the skin every 28 (twenty-eight) days. 0.6 mL 0   prochlorperazine (COMPAZINE) 10 MG tablet Take 1 tablet (10 mg total) by mouth every 6 (six) hours as needed for nausea or vomiting. 30 tablet 1   No current facility-administered medications for this visit.    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 No extra uterine extension. No evidence of metastatic disease within the pelvis.   Mild thickening of the endometrial stripe, likely related to entrapment by the adjacent uterine mass   06/03/2021 Initial Diagnosis   Uterine sarcoma (HCC)   06/03/2021 Cancer Staging   Staging form: Corpus Uteri - Sarcoma, AJCC 7th Edition - Clinical stage from 06/03/2021: FIGO Stage IVB (rT1b, N0, M1) - Signed by Artis Delay, MD on 03/31/2022 Diagnostic confirmation: Positive histology Stage prefix: Recurrence Biopsy of metastatic site performed: No Lymph-vascular invasion (LVI): LVI present/identified, NOS   06/03/2021 Pathology Results   FINAL MICROSCOPIC DIAGNOSIS:   A. UTERUS, CERVIX, BILATERAL FALLOPIAN TUBES AND OVARIES, HYSTERECTOMY:  - Uterus:       Mixed high grade uterine sarcoma, spanning 6 cm, see comment.       Extensive lymphovascular invasion.       See oncology table.  - Cervix: Benign squamous and endocervical mucosa. No dysplasia or  malignancy.  - Bilateral ovaries: Unremarkable. No malignancy.  - Bilateral fallopian tubes: Unremarkable. No malignancy.   ONCOLOGY TABLE:   UTERUS, SARCOMA: Resection   Procedure:  Total hysterectomy and bilateral salpingo-oophorectomy  Specimen Integrity: Focally disrupted on posterior surface  Tumor Site: Anterior wall  Tumor Size: 6 cm  Histologic Type: High grade sarcoma, mixed. See comment.  Other Tissue/ Organ Involvement: Not identified  Lymphovascular Invasion: Present, extensive.  Margins: All margins negative for tumor  Regional Lymph Nodes: Not applicable (no lymph nodes submitted or found)  Distant Metastasis:       Distant Site(s) Involved: Not applicable  Pathologic Stage Classification (pTNM, AJCC 8th Edition): pT1b, pN not  assigned  Ancillary Studies: Can be performed upon request  Representative Tumor Block: A6  Comment(s): The tumor has two morphologically different components. One component consists of malignant spindle cells which can be seen arising from the smooth muscle. These foci are positive for SMA, desmin, cyclinD1 (focal), and CD10 (patchy). The other component consists of round to slightly spindle cells with abundant admixed vessels and occasional large pleomorphic cells. These areas are positive for SMA (patchy weak), desmin (focal), CD10 (variable with diffuse areas). Pancytokeratin is negative. 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   ADDENDUM:   PROGNOSTIC INDICATOR RESULTS:   Immunohistochemical and morphometric analysis performed  manually    Estrogen Receptor:       POSITIVE, 50%, WEAK TO MODERATE STAINING  Progesterone Receptor:   POSITIVE, 50%, MODERATE TO STRONG STAINING   Reference Range Estrogen and Progesterone Receptor       Negative  0%       Positive  >1%    06/03/2021 Surgery   Surgeon: Quinn Axe    Assistants: Dr Antionette Char (an MD assistant was necessary for tissue manipulation, management of robotic instrumentation, retraction and positioning due to the complexity of the case and hospital policies).  Operation:  Robotic-assisted laparoscopic total hysterectomy >250gm with bilateral salpingoophorectomy, minilaparotomy for specimen delivery   Surgeon: Quinn Axe    Operative Findings:  : Bulky 16cm uterus with intra-uterine mass (consistent with a fibroid-like mass) , smooth normal appearing serosa, no suspicious bulky nodes. Normal ovaries bilaterally. Normal upper abdomen. Uterus too large to deliver vaginally in tact.    06/18/2021 Imaging   1. Multiple bilateral pulmonary nodules, measuring up to 9 mm. Most of these are perifissural and subpleural in location, likely lymph nodes. Nevertheless, close follow-up recommended to exclude metastatic disease. 2. 11 mm hypoattenuating lesion towards the dome of the liver, indeterminate. MRI abdomen with and without contrast could be used to further evaluate as clinically warranted. 3. Aortic Atherosclerosis (ICD10-I70.0).   10/13/2021 Imaging   1. No acute findings within the abdomen or pelvis. No specific findings identified to suggest residual or recurrence of tumor. 2. Stable small pulmonary nodules within the left lower lobe.   03/18/2022 Imaging   1. Interval development of 2 soft tissue nodules in the anterior pelvis measuring 10 mm and 7 mm respectively. Close follow-up recommended to exclude metastatic disease. PET-CT may be warranted to further evaluate. 2. Development of 2 mm left upper lobe parenchymal nodule, nonspecific. Close attention on follow-up imaging recommended  3. Stable appearance of index bilateral pulmonary nodules identified previously. 4. Aortic Atherosclerosis (ICD10-I70.0).   04/02/2022 PET scan   1. Signs of local tumor recurrence noted at the vaginal cuff. 2. 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 New since 10/13/2021 and progressive when compared with 03/17/2022. 3. New focal area of increased uptake within the right biceps femoris muscle which is suspicious for skeletal  muscle involvement. 4. Stable appearance of small pulmonary nodules described on 03/17/2022. These are too small to characterize by PET-CT.   04/09/2022 Procedure   Successful placement of a right IJ approach Power Port with ultrasound and fluoroscopic guidance. The catheter is ready for use.   04/09/2022 Echocardiogram    1. Left ventricular ejection fraction, by estimation, is 65 to 70%. The left ventricle has normal function. The left ventricle has no regional wall motion abnormalities. There is mild concentric left ventricular hypertrophy. Left ventricular diastolic parameters are consistent with Grade I diastolic dysfunction (impaired relaxation).  2. Right ventricular systolic function is normal. The right ventricular size is normal.  3. The mitral valve is normal in structure. No evidence of mitral valve regurgitation.  4. The aortic valve is tricuspid. Aortic valve regurgitation is not visualized. No aortic stenosis is present.  5. The inferior vena cava is normal in size with greater than 50% respiratory variability, suggesting right atrial pressure of 3 mmHg.   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 Status post hysterectomy and bilateral oophorectomy. Multiple newand enlarged peritoneal  nodules throughout the low abdomen and pelvis, previously FDG avid and consistent with worsened locally recurrent and peritoneal metastatic disease. 2. Multiple small bilateral pulmonary nodules, several of which are slightly enlarged compared to prior examination, consistent with worsened pulmonary metastatic disease.   Aortic Atherosclerosis (ICD10-I70.0).     06/25/2022 - 03/04/2023 Chemotherapy   Patient is on Treatment Plan : UTERINE UNDIFFERENTIATED LEIOMYOSARCOMA Gemcitabine D1,8 + Docetaxel D8 (900/100) q21d     09/02/2022 Imaging   1. Moderate response to therapy of peritoneal metastasis  within the anterior pelvis. 2. Minimal response to therapy of pulmonary metastasis. 3. No new or progressive disease. 4.  Aortic Atherosclerosis (ICD10-I70.0).     12/01/2022 Imaging   1. No new or progressive findings in the chest, abdomen, or pelvis. 2. Peritoneal metastases in the pelvis described previously measure smaller today compatible with interval response to therapy. 3. Stable tiny bilateral pulmonary nodules. Previously characterized as metastatic lesions, these are unchanged in the interval. No new suspicious pulmonary nodule or mass. 4.  Aortic Atherosclerois (ICD10-170.0)   03/04/2023 Imaging   CT CHEST ABDOMEN PELVIS W CONTRAST  Result Date: 03/03/2023 CLINICAL DATA:  Cervical cancer * Tracking Code: BO *. Assess treatment response. Current chemotherapy. Prior total hysterectomy EXAM: CT CHEST, ABDOMEN, AND PELVIS WITH CONTRAST TECHNIQUE: Multidetector CT imaging of the chest, abdomen and pelvis was performed following the standard protocol during bolus administration of intravenous contrast. RADIATION DOSE REDUCTION: This exam was performed according to the departmental dose-optimization program which includes automated exposure control, adjustment of the mA and/or kV according to patient size and/or use of iterative reconstruction technique. CONTRAST:  OMNIPAQUE IOHEXOL 300 MG/ML  SOLN COMPARISON:  CT 12/01/2022 and older FINDINGS: CT CHEST FINDINGS Cardiovascular: Right upper chest port. Heart is nonenlarged. Trace pericardial fluid. Normal caliber thoracic aorta. Mediastinum/Nodes: No specific abnormal lymph node enlargement seen in the axillary region, hilum or mediastinum. Mildly patulous thoracic esophagus. Thyroid gland is unremarkable. Lungs/Pleura: Tiny pleural effusions are seen, left-greater-than-right. These new from previous. There is some patchy parenchymal opacity identified in the superior segment of the lower lobes, left-greater-than-right. This could be  infiltrative rather than neoplastic. Recommend short follow-up. There were some small nodules identified on the prior. For example 3 mm nodule lateral left lower lobe subpleural on series 7, image 91 is stable. Right lower lobe subpleural nodule on the prior which measured 9 x 6 mm, today is stable on image 73 of series 7. Stable 2-3 mm nodule right lower lobe on series 7, image 97. Musculoskeletal: Curvature of the spine with some degenerative changes. CT ABDOMEN PELVIS FINDINGS Hepatobiliary: Stable dome hepatic cyst. No new space-occupying lesion in the liver. Patent portal vein. Gallbladder is distended. Pancreas: Unremarkable. No pancreatic ductal dilatation or surrounding inflammatory changes. Spleen: Spleen is slightly enlarged at 13.1 cm in AP length. Preserved enhancement. Small splenule inferiorly. Adrenals/Urinary Tract: The adrenal glands are preserved. Tiny low-attenuation cystic lesions are seen which are too small to completely characterize along the kidneys. Bosniak 2 lesions. No specific imaging follow-up. The ureters have normal course and caliber down to the bladder. Bladder wall is diffusely thickened. The bladder is contracted. There is significant stranding. This is progressive from the previous examination. Stomach/Bowel: Large bowel has a normal course and caliber with scattered stool. Overall moderate stool burden. Normal appendix extends inferior to the cecum in the right lower quadrant. The stomach and small bowel are nondilated. Vascular/Lymphatic: Normal caliber aorta and IVC with some vascular calcifications. No specific abnormal lymph  node enlargement identified in the abdomen and pelvis. Reproductive: Absence of the uterus and ovaries. Other: Once again there is a pelvic soft tissue nodule anteriorly on series 2, image 104 which on the prior measured 2.1 x 1.6 cm and today 1.9 x 1.3 cm. Smaller foci more caudal on axial image 111 and 112 are stable. Musculoskeletal: Curvature and  degenerative changes are seen along the spine. Degenerative changes along the pelvis. IMPRESSION: Stable pelvic peritoneal nodules. No new lymph node enlargement seen in the chest, abdomen or pelvis. New tiny pleural effusions, left-greater-than-right with some ill-defined parenchymal opacities along the lower lobes. Although there is a differential this could be infectious or inflammatory. Recommend short follow-up and correlation to specific symptoms. Previous tiny lung nodules are stable. Continued follow-up surveillance as per the patient's neoplasm. Mild splenomegaly. Worsening bladder wall thickening and stranding. Please correlate with symptoms and treatment Electronically Signed   By: Karen Kays M.D.   On: 03/03/2023 18:23      04/05/2023 Imaging   Normal bone density scan   06/21/2023 Imaging   CT CHEST ABDOMEN PELVIS W CONTRAST  Result Date: 06/20/2023 CLINICAL DATA:  Endometrial cancer restaging, chemotherapy complete, ongoing oral chemotherapy * Tracking Code: BO * EXAM: CT CHEST, ABDOMEN, AND PELVIS WITH CONTRAST TECHNIQUE: Multidetector CT imaging of the chest, abdomen and pelvis was performed following the standard protocol during bolus administration of intravenous contrast. RADIATION DOSE REDUCTION: This exam was performed according to the departmental dose-optimization program which includes automated exposure control, adjustment of the mA and/or kV according to patient size and/or use of iterative reconstruction technique. CONTRAST:  OMNIPAQUE IOHEXOL 300 MG/ML  SOLN COMPARISON:  CT chest angiogram, 03/23/2023, CT chest abdomen pelvis, 03/02/2023 FINDINGS: CT CHEST FINDINGS Cardiovascular: No significant vascular findings. Normal heart size. No pericardial effusion. Mediastinum/Nodes: No enlarged mediastinal, hilar, or axillary lymph nodes. Thyroid gland, trachea, and esophagus demonstrate no significant findings. Lungs/Pleura: Interval resolution of previously seen pleural effusions.  Significant interval improvement of previously seen heterogeneous and ground-glass airspace opacity, with small residual opacities present in the lower lobes (series 4, image 68). Multiple small subpleural nodules are unchanged, largest in the dependent right lower lobe measuring 0.9 cm (series 4, image 75). Musculoskeletal: No chest wall abnormality. No acute osseous findings. CT ABDOMEN PELVIS FINDINGS Hepatobiliary: No solid liver abnormality is seen. Benign simple cyst of the liver dome, requiring no further follow-up or characterization (series 2, image 45). No gallstones, gallbladder wall thickening, or biliary dilatation. Pancreas: Unremarkable. No pancreatic ductal dilatation or surrounding inflammatory changes. Spleen: Normal in size without significant abnormality. Adrenals/Urinary Tract: Adrenal glands are unremarkable. Simple, benign right renal cortical cysts, for which no further follow-up or characterization is required. Kidneys are otherwise normal, without renal calculi, solid lesion, or hydronephrosis. Bladder is unremarkable. Stomach/Bowel: Stomach is within normal limits. Appendix appears normal. No evidence of bowel wall thickening, distention, or inflammatory changes. Moderate burden of stool throughout the colon and rectum. Vascular/Lymphatic: No significant vascular findings are present. No enlarged abdominal or pelvic lymph nodes. Reproductive: Status post hysterectomy. Other: No abdominal wall hernia or abnormality. No ascites. Largest soft tissue nodule in the low midline abdomen is increased in size, measuring 2.8 x 2.5 cm, previously 2.4 x 2.1 cm (series 2, image 105). Multiple additional soft tissue nodules throughout the pelvis are unchanged, largest in the anterior right pelvis measuring 1.6 x 1.2 cm (series 2, image 112). Additional nodule at the right aspect of the vaginal cuff measures 2.0 x 1.0 cm (  series 2, image 112). Musculoskeletal: No acute osseous findings. IMPRESSION: 1.  Largest peritoneal soft tissue nodule in the low midline abdomen is increased in size, consistent with worsened peritoneal metastatic disease. Multiple additional soft tissue nodules throughout the pelvis are unchanged. 2. 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 3. Interval resolution of previously seen pleural effusions. 4. Significant interval improvement of previously seen heterogeneous and ground-glass airspace opacity, with small residual opacities present in the lower lobes. Findings are consistent with improved nonspecific infection or inflammation. 5. Status post hysterectomy. Electronically Signed   By: Jearld Lesch M.D.   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 * Tracking Code: BO * EXAM: CT CHEST, ABDOMEN, AND PELVIS WITH CONTRAST TECHNIQUE: Multidetector CT imaging of the chest, abdomen and pelvis was performed following the standard protocol during bolus administration of intravenous contrast. RADIATION DOSE REDUCTION: This exam was performed according to the departmental dose-optimization program which includes automated exposure control, adjustment of the mA and/or kV according to patient size and/or use of iterative reconstruction technique. CONTRAST:  OMNIPAQUE IOHEXOL 300 MG/ML  SOLN COMPARISON:  Multiple priors including most recent CT June 18, 2023 FINDINGS: CT CHEST FINDINGS Cardiovascular: Aortic atherosclerosis. Accessed right chest Port-A-Cath with tip at the superior cavoatrial junction. No central pulmonary embolus on this nondedicated study. Normal size heart. No significant pericardial effusion/thickening. Mediastinum/Nodes: No suspicious thyroid nodule. No pathologically enlarged mediastinal, hilar or axillary lymph nodes. Small hiatal hernia. Lungs/Pleura: Bilateral solid pulmonary nodules, some of which are  stable in size others are increased in size. For reference: -left upper lobe pulmonary nodule measuring 6 mm on image 77/2 previously measured 3 mm. -subpleural right lower lobe pulmonary nodule measures 9 mm on image 77/7, unchanged. A few new scattered tiny pulmonary nodules for instance a 3 mm pulmonary nodule in the right lower lobe on image 87/7. Musculoskeletal: No aggressive lytic or blastic lesion of bone. Multilevel degenerative changes spine. CT ABDOMEN PELVIS FINDINGS Hepatobiliary: Stable 9 mm hypodense lesion in the dome of the liver previously characterized as a cyst on MRI July 04, 2021. No suspicious hepatic lesion. Gallbladder is unremarkable. No biliary ductal dilation. Pancreas: No pancreatic ductal dilation or evidence of acute inflammation. Spleen: No splenomegaly. Adrenals/Urinary Tract: Bilateral adrenal glands appear normal. No hydronephrosis. Stomach/Bowel: No radiopaque enteric contrast material was administered. Stomach is unremarkable for degree of distension. Normal appendix. No pathologic dilation of small or large bowel. Vascular/Lymphatic: Aortic atherosclerosis. Normal caliber abdominal aorta. Smooth IVC contours. The portal, splenic and superior mesenteric veins are patent. No pathologically enlarged abdominal or pelvic lymph nodes. Reproductive: Prior hysterectomy with increased size of the vaginal cuff nodules previously indexed nodule in the right vaginal cuff measures 3.2 cm on image 116/2 previously 2.0 cm. Other: Increased size and number of peritoneal/omental soft tissue implants. For reference: -previously indexed soft tissue nodule in the anterior pelvis now measures 5.7 x 3.6 cm on image 105/2 previously 2.8 x 2.5 cm. -new pelvic soft tissue nodule measures 2.3 cm on image 106/2. Trace free fluid in the pelvis with a new walled-off pelvic fluid collection measuring 3.9 cm adjacent to a soft tissue implant on image 105/2. Musculoskeletal: No aggressive lytic or blastic  lesion of bone. IMPRESSION: 1. Worsening peritoneal carcinomatosis, increased size of the vaginal cuff nodules, and increased size/of bilateral solid pulmonary nodules, consistent with worsening metastatic disease.  2. Aortic Atherosclerosis (ICD10-I70.0). Electronically Signed   By: Maudry Mayhew M.D.   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       PHYSICAL EXAMINATION: ECOG PERFORMANCE STATUS: 0 - Asymptomatic  Vitals:   10/21/23 0824  BP: 133/71  Pulse: 97  Resp: 18  Temp: 99.3 F (37.4 C)  SpO2: 97%   Filed Weights   10/21/23 0824  Weight: 192 lb 3.2 oz (87.2 kg)    GENERAL:alert, no distress and comfortable  LABORATORY DATA:  I have reviewed the data as listed    Component Value Date/Time   NA 137 10/21/2023 0727   K 4.1 10/21/2023 0727   CL 104 10/21/2023 0727   CO2 26 10/21/2023 0727   GLUCOSE 109 (H) 10/21/2023 0727   BUN 16 10/21/2023 0727   CREATININE 0.61 10/21/2023 0727   CREATININE 0.55 10/05/2023 0912   CALCIUM 9.5 10/21/2023 0727   PROT 6.4 (L) 10/21/2023 0727   ALBUMIN 3.5 10/21/2023 0727   AST 18 10/21/2023 0727   AST 15 10/05/2023 0912   ALT 17 10/21/2023 0727   ALT 11 10/05/2023 0912   ALKPHOS 98 10/21/2023 0727   BILITOT 0.3 10/21/2023 0727   BILITOT 0.4 10/05/2023 0912   GFRNONAA >60 10/21/2023 0727   GFRNONAA >60 10/05/2023 0912    No results found for: "SPEP", "UPEP"  Lab Results  Component Value Date   WBC 12.6 (H) 10/21/2023   NEUTROABS 8.7 (H) 10/21/2023   HGB 12.4 10/21/2023   HCT 36.2 10/21/2023   MCV 93.3 10/21/2023   PLT 422 (H) 10/21/2023      Chemistry      Component Value Date/Time   NA 137 10/21/2023 0727   K 4.1 10/21/2023 0727   CL 104 10/21/2023 0727   CO2 26 10/21/2023 0727   BUN 16 10/21/2023 0727   CREATININE 0.61 10/21/2023 0727   CREATININE 0.55 10/05/2023 0912       Component Value Date/Time   CALCIUM 9.5 10/21/2023 0727   ALKPHOS 98 10/21/2023 0727   AST 18 10/21/2023 0727   AST 15 10/05/2023 0912   ALT 17 10/21/2023 0727   ALT 11 10/05/2023 0912   BILITOT 0.3 10/21/2023 0727   BILITOT 0.4 10/05/2023 0912

## 2023-10-23 ENCOUNTER — Inpatient Hospital Stay: Payer: Managed Care, Other (non HMO)

## 2023-10-23 VITALS — BP 133/75 | HR 88 | Temp 97.8°F | Resp 16

## 2023-10-23 DIAGNOSIS — C549 Malignant neoplasm of corpus uteri, unspecified: Secondary | ICD-10-CM

## 2023-10-23 DIAGNOSIS — Z5111 Encounter for antineoplastic chemotherapy: Secondary | ICD-10-CM | POA: Diagnosis not present

## 2023-10-23 MED ORDER — PEGFILGRASTIM INJECTION 6 MG/0.6ML ~~LOC~~
6.0000 mg | PREFILLED_SYRINGE | Freq: Once | SUBCUTANEOUS | Status: AC
  Administered 2023-10-23: 6 mg via SUBCUTANEOUS
  Filled 2023-10-23: qty 0.6

## 2023-10-29 ENCOUNTER — Telehealth: Payer: Self-pay | Admitting: Oncology

## 2023-10-29 ENCOUNTER — Encounter: Payer: Self-pay | Admitting: Hematology and Oncology

## 2023-10-29 NOTE — Telephone Encounter (Signed)
Called [name] back and advised her of message from Dr. Bertis Ruddy.  She verbalized understanding and is going to try Tylenol PM.

## 2023-10-29 NOTE — Telephone Encounter (Signed)
I typically do not prescribe long term sleep medications Simple things to try would be melatonin supplement or tylenol PM Or she can reach out to her PCP for prescriptions

## 2023-10-29 NOTE — Telephone Encounter (Signed)
Leah Diaz called and reports having trouble sleeping.  She can fall asleep but wakes up at 3 am and can't go back to sleep. Her mother takes an antidepressant to help her sleep and she is wondering what Dr. Bertis Ruddy would recommend.

## 2023-11-02 ENCOUNTER — Ambulatory Visit: Payer: Managed Care, Other (non HMO) | Admitting: Internal Medicine

## 2023-11-02 ENCOUNTER — Ambulatory Visit: Payer: Managed Care, Other (non HMO) | Admitting: Hematology and Oncology

## 2023-11-02 ENCOUNTER — Ambulatory Visit: Payer: Managed Care, Other (non HMO)

## 2023-11-02 ENCOUNTER — Other Ambulatory Visit: Payer: Managed Care, Other (non HMO)

## 2023-11-04 ENCOUNTER — Encounter: Payer: Self-pay | Admitting: Hematology and Oncology

## 2023-11-04 ENCOUNTER — Inpatient Hospital Stay: Payer: Managed Care, Other (non HMO)

## 2023-11-04 ENCOUNTER — Other Ambulatory Visit (HOSPITAL_COMMUNITY): Payer: Self-pay

## 2023-11-04 ENCOUNTER — Inpatient Hospital Stay: Payer: Managed Care, Other (non HMO) | Admitting: Hematology and Oncology

## 2023-11-04 ENCOUNTER — Other Ambulatory Visit: Payer: Self-pay

## 2023-11-04 VITALS — BP 130/67 | HR 100 | Temp 98.5°F | Resp 18 | Ht 67.0 in | Wt 192.0 lb

## 2023-11-04 DIAGNOSIS — C549 Malignant neoplasm of corpus uteri, unspecified: Secondary | ICD-10-CM

## 2023-11-04 DIAGNOSIS — T451X5A Adverse effect of antineoplastic and immunosuppressive drugs, initial encounter: Secondary | ICD-10-CM

## 2023-11-04 DIAGNOSIS — C543 Malignant neoplasm of fundus uteri: Secondary | ICD-10-CM

## 2023-11-04 DIAGNOSIS — K1231 Oral mucositis (ulcerative) due to antineoplastic therapy: Secondary | ICD-10-CM | POA: Diagnosis not present

## 2023-11-04 DIAGNOSIS — D6481 Anemia due to antineoplastic chemotherapy: Secondary | ICD-10-CM | POA: Diagnosis not present

## 2023-11-04 DIAGNOSIS — Z5111 Encounter for antineoplastic chemotherapy: Secondary | ICD-10-CM | POA: Diagnosis not present

## 2023-11-04 LAB — CBC WITH DIFFERENTIAL (CANCER CENTER ONLY)
Abs Immature Granulocytes: 0.39 10*3/uL — ABNORMAL HIGH (ref 0.00–0.07)
Basophils Absolute: 0.1 10*3/uL (ref 0.0–0.1)
Basophils Relative: 1 %
Eosinophils Absolute: 0.2 10*3/uL (ref 0.0–0.5)
Eosinophils Relative: 2 %
HCT: 34.3 % — ABNORMAL LOW (ref 36.0–46.0)
Hemoglobin: 11.6 g/dL — ABNORMAL LOW (ref 12.0–15.0)
Immature Granulocytes: 4 %
Lymphocytes Relative: 23 %
Lymphs Abs: 2.6 10*3/uL (ref 0.7–4.0)
MCH: 31.7 pg (ref 26.0–34.0)
MCHC: 33.8 g/dL (ref 30.0–36.0)
MCV: 93.7 fL (ref 80.0–100.0)
Monocytes Absolute: 1.1 10*3/uL — ABNORMAL HIGH (ref 0.1–1.0)
Monocytes Relative: 10 %
Neutro Abs: 6.6 10*3/uL (ref 1.7–7.7)
Neutrophils Relative %: 60 %
Platelet Count: 138 10*3/uL — ABNORMAL LOW (ref 150–400)
RBC: 3.66 MIL/uL — ABNORMAL LOW (ref 3.87–5.11)
RDW: 16 % — ABNORMAL HIGH (ref 11.5–15.5)
WBC Count: 10.9 10*3/uL — ABNORMAL HIGH (ref 4.0–10.5)
nRBC: 0 % (ref 0.0–0.2)

## 2023-11-04 LAB — CMP (CANCER CENTER ONLY)
ALT: 15 U/L (ref 0–44)
AST: 17 U/L (ref 15–41)
Albumin: 3.4 g/dL — ABNORMAL LOW (ref 3.5–5.0)
Alkaline Phosphatase: 101 U/L (ref 38–126)
Anion gap: 4 — ABNORMAL LOW (ref 5–15)
BUN: 10 mg/dL (ref 8–23)
CO2: 28 mmol/L (ref 22–32)
Calcium: 8.8 mg/dL — ABNORMAL LOW (ref 8.9–10.3)
Chloride: 106 mmol/L (ref 98–111)
Creatinine: 0.52 mg/dL (ref 0.44–1.00)
GFR, Estimated: >60 mL/min (ref >60)
Glucose, Bld: 108 mg/dL — ABNORMAL HIGH (ref 70–99)
Potassium: 4.2 mmol/L (ref 3.5–5.1)
Sodium: 138 mmol/L (ref 135–145)
Total Bilirubin: 0.3 mg/dL (ref <1.2)
Total Protein: 5.8 g/dL — ABNORMAL LOW (ref 6.5–8.1)

## 2023-11-04 MED ORDER — SODIUM CHLORIDE 0.9% FLUSH
10.0000 mL | Freq: Once | INTRAVENOUS | Status: AC
  Administered 2023-11-04: 10 mL

## 2023-11-04 MED ORDER — SODIUM CHLORIDE 0.9 % IV SOLN
600.0000 mg/m2 | Freq: Once | INTRAVENOUS | Status: AC
Start: 2023-11-04 — End: 2023-11-04
  Administered 2023-11-04: 1216 mg via INTRAVENOUS
  Filled 2023-11-04: qty 31.98

## 2023-11-04 MED ORDER — LIDOCAINE VISCOUS HCL 2 % MT SOLN
5.0000 mL | Freq: Four times a day (QID) | OROMUCOSAL | 0 refills | Status: DC
  Filled 2023-11-04: qty 160, 8d supply, fill #0

## 2023-11-04 MED ORDER — HEPARIN SOD (PORK) LOCK FLUSH 100 UNIT/ML IV SOLN
500.0000 [IU] | Freq: Once | INTRAVENOUS | Status: AC | PRN
Start: 2023-11-04 — End: 2023-11-04
  Administered 2023-11-04: 500 [IU]

## 2023-11-04 MED ORDER — SODIUM CHLORIDE 0.9% FLUSH
10.0000 mL | INTRAVENOUS | Status: DC | PRN
  Administered 2023-11-04: 10 mL

## 2023-11-04 MED ORDER — PROCHLORPERAZINE MALEATE 10 MG PO TABS
10.0000 mg | ORAL_TABLET | Freq: Once | ORAL | Status: AC
Start: 2023-11-04 — End: 2023-11-04
  Administered 2023-11-04: 10 mg via ORAL
  Filled 2023-11-04: qty 1

## 2023-11-04 MED ORDER — SODIUM CHLORIDE 0.9 % IV SOLN: INTRAVENOUS | Status: DC

## 2023-11-04 NOTE — Assessment & Plan Note (Signed)
This is likely due to recent treatment. The patient denies recent history of bleeding such as epistaxis, hematuria or hematochezia. She is asymptomatic from the anemia. I will observe for now.  She does not require transfusion now. I will continue the chemotherapy at current dose without dosage adjustment.  If the anemia gets progressive worse in the future, I might have to delay her treatment or adjust the chemotherapy dose.  

## 2023-11-04 NOTE — Progress Notes (Signed)
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 The patient denies recent history of bleeding such as epistaxis, hematuria or hematochezia. She is asymptomatic from the anemia. I will observe for now.  She does not require transfusion now. I will continue the chemotherapy at current dose without dosage adjustment.  If the anemia gets progressive worse in the future, I might have to delay her treatment or adjust the chemotherapy dose.   No orders of the defined types were placed in this encounter.   All questions were answered. The patient knows to call the clinic with any problems, questions or concerns. The total time spent in the appointment was 20 minutes encounter with patients including review of chart and various tests results, discussions about plan of care and coordination of care plan   Artis Delay, MD 11/04/2023 12:37 PM  INTERVAL HISTORY: Please see below for problem oriented charting. she returns for hemotherapy follow-up She tolerated treatment well Denies peripheral neuropathy No recent infection She has occasional mucositis but otherwise she tolerated treatment fairly well  REVIEW OF SYSTEMS:   Constitutional: Denies fevers, chills or abnormal weight loss Eyes: Denies  blurriness of vision Respiratory: Denies cough, dyspnea or wheezes Cardiovascular: Denies palpitation, chest discomfort or lower extremity swelling Gastrointestinal:  Denies nausea, heartburn or change in bowel habits Skin: Denies abnormal skin rashes Lymphatics: Denies new lymphadenopathy or easy bruising Neurological:Denies numbness, tingling or new weaknesses Behavioral/Psych: Mood is stable, no new changes  All other systems were reviewed with the patient and are negative.  I have reviewed the past medical history, past surgical history, social history and family history with the patient and they are unchanged from previous note.  ALLERGIES:  has no known allergies.  MEDICATIONS:  Current Outpatient Medications  Medication Sig Dispense Refill   acetaminophen (TYLENOL) 500 MG tablet Take 500 mg by mouth every 6 (six) hours as needed for mild pain, moderate pain or fever.     aspirin 81 MG EC tablet Take by mouth daily.     Cholecalciferol (VITAMIN D3 PO) Take 1 tablet by mouth daily.     dexamethasone (DECADRON) 4 MG tablet Take 2 tabs by mouth 2 times daily starting day before chemo with Taxotere. Then take 2 tabs daily for 2 days starting day after chemo. Take with food. 30 tablet 1   furosemide (LASIX) 20 MG tablet Take 1 tablet (20 mg total) by mouth as needed for fluid or edema. Only takes 2-3 days per week 30 tablet 3   lidocaine-prilocaine (EMLA) cream Apply to affected area once 30 g 3   loratadine (CLARITIN) 10 MG tablet Take 10 mg by mouth daily.     LORazepam (ATIVAN) 0.5 MG tablet Take 1 tablet (0.5 mg total) by mouth 2 (two) times daily as needed for anxiety. 10 tablet 0   losartan (COZAAR) 50  MG tablet Take 1 tablet (50 mg total) by mouth daily. 90 tablet 1   magic mouthwash w/lidocaine SOLN Take 5 mLs by mouth 4 (four) times daily. Suspension contains equal amounts of Maalox Extra Strength, nystatin, diphenhydramine and lidocaine. 5 ml 4 x a day, swish and spit 160 mL 0    metoprolol tartrate (LOPRESSOR) 25 MG tablet Take 1 tablet (25 mg total) by mouth 2 (two) times daily. 180 tablet 1   Multiple Vitamin (MULTIVITAMIN WITH MINERALS) TABS tablet Take 1 tablet by mouth daily. 30 tablet 1   omeprazole (PRILOSEC) 20 MG capsule Take 1 capsule (20 mg total) by mouth 2 (two) times daily. 28 capsule 0   ondansetron (ZOFRAN) 8 MG tablet Take 1 tablet (8 mg total) by mouth every 8 (eight) hours as needed for nausea. 30 tablet 3   ondansetron (ZOFRAN) 8 MG tablet Take 1 tablet (8 mg total) by mouth every 8 (eight) hours as needed for nausea or vomiting. 30 tablet 1   pegfilgrastim (NEULASTA) 6 MG/0.6ML injection Inject 0.6 mLs (6 mg total) into the skin every 28 (twenty-eight) days. 0.6 mL 0   prochlorperazine (COMPAZINE) 10 MG tablet Take 1 tablet (10 mg total) by mouth every 6 (six) hours as needed for nausea or vomiting. 30 tablet 1   No current facility-administered medications for this visit.   Facility-Administered Medications Ordered in Other Visits  Medication Dose Route Frequency Provider Last Rate Last Admin   0.9 %  sodium chloride infusion   Intravenous Continuous Bertis Ruddy, Claudy Abdallah, MD 10 mL/hr at 11/04/23 1215 New Bag at 11/04/23 1215   gemcitabine (GEMZAR) 1,216 mg in sodium chloride 0.9 % 250 mL chemo infusion  600 mg/m2 (Treatment Plan Recorded) Intravenous Once Bertis Ruddy, Talisha Erby, MD       heparin lock flush 100 unit/mL  500 Units Intracatheter Once PRN Bertis Ruddy, Odyssey Vasbinder, MD       sodium chloride flush (NS) 0.9 % injection 10 mL  10 mL Intracatheter PRN Artis Delay, MD        SUMMARY OF ONCOLOGIC HISTORY: Oncology History Overview Note  High grade and LMS, 50% ER/PR positive dMMR normal, PD-L1 CPS 3% Failed doxorubicin   Uterine sarcoma (HCC)  06/02/2021 Imaging   MRI pelvis Heterogeneous 9.0 cm intrauterine mass within the intramural/submucosal anterior fundus demonstrating interval growth, internal enhancement, and restricted diffusion suspicious for an intrauterine  leiomyosarcoma in this postmenopausal patient. No extra uterine extension. No evidence of metastatic disease within the pelvis.   Mild thickening of the endometrial stripe, likely related to entrapment by the adjacent uterine mass   06/03/2021 Initial Diagnosis   Uterine sarcoma (HCC)   06/03/2021 Cancer Staging   Staging form: Corpus Uteri - Sarcoma, AJCC 7th Edition - Clinical stage from 06/03/2021: FIGO Stage IVB (rT1b, N0, M1) - Signed by Artis Delay, MD on 03/31/2022 Diagnostic confirmation: Positive histology Stage prefix: Recurrence Biopsy of metastatic site performed: No Lymph-vascular invasion (LVI): LVI present/identified, NOS   06/03/2021 Pathology Results   FINAL MICROSCOPIC DIAGNOSIS:   A. UTERUS, CERVIX, BILATERAL FALLOPIAN TUBES AND OVARIES, HYSTERECTOMY:  - Uterus:       Mixed high grade uterine sarcoma, spanning 6 cm, see comment.       Extensive lymphovascular invasion.       See oncology table.  - Cervix: Benign squamous and endocervical mucosa. No dysplasia or  malignancy.  - Bilateral ovaries: Unremarkable. No malignancy.  - Bilateral fallopian tubes: Unremarkable. No malignancy.   ONCOLOGY TABLE:   UTERUS, SARCOMA:  Resection   Procedure: Total hysterectomy and bilateral salpingo-oophorectomy  Specimen Integrity: Focally disrupted on posterior surface  Tumor Site: Anterior wall  Tumor Size: 6 cm  Histologic Type: High grade sarcoma, mixed. See comment.  Other Tissue/ Organ Involvement: Not identified  Lymphovascular Invasion: Present, extensive.  Margins: All margins negative for tumor  Regional Lymph Nodes: Not applicable (no lymph nodes submitted or found)  Distant Metastasis:       Distant Site(s) Involved: Not applicable  Pathologic Stage Classification (pTNM, AJCC 8th Edition): pT1b, pN not  assigned  Ancillary Studies: Can be performed upon request  Representative Tumor Block: A6  Comment(s): The tumor has two morphologically different components.  One component consists of malignant spindle cells which can be seen arising from the smooth muscle. These foci are positive for SMA, desmin, cyclinD1 (focal), and CD10 (patchy). The other component consists of round to slightly spindle cells with abundant admixed vessels and occasional large pleomorphic cells. These areas are positive for SMA (patchy weak), desmin (focal), CD10 (variable with diffuse areas). Pancytokeratin is negative. 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   ADDENDUM:   PROGNOSTIC INDICATOR RESULTS:   Immunohistochemical and morphometric analysis performed manually    Estrogen Receptor:       POSITIVE, 50%, WEAK TO MODERATE STAINING  Progesterone Receptor:   POSITIVE, 50%, MODERATE TO STRONG STAINING   Reference Range Estrogen and Progesterone Receptor       Negative  0%       Positive  >1%    06/03/2021 Surgery   Surgeon: Quinn Axe    Assistants: Dr Antionette Char (an MD assistant was necessary for tissue manipulation, management of robotic instrumentation, retraction and positioning due to the complexity of the case and hospital policies).  Operation: Robotic-assisted laparoscopic total hysterectomy >250gm with bilateral salpingoophorectomy, minilaparotomy for specimen delivery   Surgeon: Quinn Axe    Operative Findings:  : Bulky 16cm uterus with intra-uterine mass (consistent with a fibroid-like mass) , smooth normal appearing serosa, no suspicious bulky nodes. Normal ovaries bilaterally. Normal upper abdomen. Uterus too large to deliver vaginally in tact.    06/18/2021 Imaging   1. Multiple bilateral pulmonary nodules, measuring up to 9 mm. Most of these are perifissural and subpleural in location, likely lymph nodes. Nevertheless, close follow-up recommended to exclude metastatic disease. 2. 11 mm hypoattenuating lesion towards the dome of the liver, indeterminate. MRI  abdomen with and without contrast could be used to further evaluate as clinically warranted. 3. Aortic Atherosclerosis (ICD10-I70.0).   10/13/2021 Imaging   1. No acute findings within the abdomen or pelvis. No specific findings identified to suggest residual or recurrence of tumor. 2. Stable small pulmonary nodules within the left lower lobe.   03/18/2022 Imaging   1. Interval development of 2 soft tissue nodules in the anterior pelvis measuring 10 mm and 7 mm respectively. Close follow-up recommended to exclude metastatic disease. PET-CT may be warranted to further evaluate. 2. Development of 2 mm left upper lobe parenchymal nodule, nonspecific. Close attention on follow-up imaging recommended  3. Stable appearance of index bilateral pulmonary nodules identified previously. 4. Aortic Atherosclerosis (ICD10-I70.0).   04/02/2022 PET scan   1. Signs of local tumor recurrence noted at the vaginal cuff. 2. 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 New since 10/13/2021 and progressive when compared with 03/17/2022. 3. New focal area of increased uptake within the right biceps femoris muscle which is  suspicious for skeletal muscle involvement. 4. Stable appearance of small pulmonary nodules described on 03/17/2022. These are too small to characterize by PET-CT.   04/09/2022 Procedure   Successful placement of a right IJ approach Power Port with ultrasound and fluoroscopic guidance. The catheter is ready for use.   04/09/2022 Echocardiogram    1. Left ventricular ejection fraction, by estimation, is 65 to 70%. The left ventricle has normal function. The left ventricle has no regional wall motion abnormalities. There is mild concentric left ventricular hypertrophy. Left ventricular diastolic parameters are consistent with Grade I diastolic dysfunction (impaired relaxation).  2. Right ventricular systolic function is normal. The right ventricular size is normal.   3. The mitral valve is normal in structure. No evidence of mitral valve regurgitation.  4. The aortic valve is tricuspid. Aortic valve regurgitation is not visualized. No aortic stenosis is present.  5. The inferior vena cava is normal in size with greater than 50% respiratory variability, suggesting right atrial pressure of 3 mmHg.   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 Status post hysterectomy and bilateral oophorectomy. Multiple newand enlarged peritoneal nodules throughout the low abdomen and pelvis, previously FDG avid and consistent with worsened locally recurrent and peritoneal metastatic disease. 2. Multiple small bilateral pulmonary nodules, several of which are slightly enlarged compared to prior examination, consistent with worsened pulmonary metastatic disease.   Aortic Atherosclerosis (ICD10-I70.0).     06/25/2022 - 03/04/2023 Chemotherapy   Patient is on Treatment Plan : UTERINE UNDIFFERENTIATED LEIOMYOSARCOMA Gemcitabine D1,8 + Docetaxel D8 (900/100) q21d     09/02/2022 Imaging   1. Moderate response to therapy of peritoneal metastasis within the anterior pelvis. 2. Minimal response to therapy of pulmonary metastasis. 3. No new or progressive disease. 4.  Aortic Atherosclerosis (ICD10-I70.0).     12/01/2022 Imaging   1. No new or progressive findings in the chest, abdomen, or pelvis. 2. Peritoneal metastases in the pelvis described previously measure smaller today compatible with interval response to therapy. 3. Stable tiny bilateral pulmonary nodules. Previously characterized as metastatic lesions, these are unchanged in the interval. No new suspicious pulmonary nodule or mass. 4.  Aortic Atherosclerois (ICD10-170.0)   03/04/2023 Imaging   CT CHEST ABDOMEN PELVIS W CONTRAST  Result Date: 03/03/2023 CLINICAL DATA:  Cervical cancer * Tracking Code: BO *. Assess  treatment response. Current chemotherapy. Prior total hysterectomy EXAM: CT CHEST, ABDOMEN, AND PELVIS WITH CONTRAST TECHNIQUE: Multidetector CT imaging of the chest, abdomen and pelvis was performed following the standard protocol during bolus administration of intravenous contrast. RADIATION DOSE REDUCTION: This exam was performed according to the departmental dose-optimization program which includes automated exposure control, adjustment of the mA and/or kV according to patient size and/or use of iterative reconstruction technique. CONTRAST:  OMNIPAQUE IOHEXOL 300 MG/ML  SOLN COMPARISON:  CT 12/01/2022 and older FINDINGS: CT CHEST FINDINGS Cardiovascular: Right upper chest port. Heart is nonenlarged. Trace pericardial fluid. Normal caliber thoracic aorta. Mediastinum/Nodes: No specific abnormal lymph node enlargement seen in the axillary region, hilum or mediastinum. Mildly patulous thoracic esophagus. Thyroid gland is unremarkable. Lungs/Pleura: Tiny pleural effusions are seen, left-greater-than-right. These new from previous. There is some patchy parenchymal opacity identified in the superior segment of the lower lobes, left-greater-than-right. This could be infiltrative rather than neoplastic. Recommend short follow-up. There were some small nodules identified on the prior. For example 3 mm nodule lateral left  lower lobe subpleural on series 7, image 91 is stable. Right lower lobe subpleural nodule on the prior which measured 9 x 6 mm, today is stable on image 73 of series 7. Stable 2-3 mm nodule right lower lobe on series 7, image 97. Musculoskeletal: Curvature of the spine with some degenerative changes. CT ABDOMEN PELVIS FINDINGS Hepatobiliary: Stable dome hepatic cyst. No new space-occupying lesion in the liver. Patent portal vein. Gallbladder is distended. Pancreas: Unremarkable. No pancreatic ductal dilatation or surrounding inflammatory changes. Spleen: Spleen is slightly enlarged at 13.1 cm in AP  length. Preserved enhancement. Small splenule inferiorly. Adrenals/Urinary Tract: The adrenal glands are preserved. Tiny low-attenuation cystic lesions are seen which are too small to completely characterize along the kidneys. Bosniak 2 lesions. No specific imaging follow-up. The ureters have normal course and caliber down to the bladder. Bladder wall is diffusely thickened. The bladder is contracted. There is significant stranding. This is progressive from the previous examination. Stomach/Bowel: Large bowel has a normal course and caliber with scattered stool. Overall moderate stool burden. Normal appendix extends inferior to the cecum in the right lower quadrant. The stomach and small bowel are nondilated. Vascular/Lymphatic: Normal caliber aorta and IVC with some vascular calcifications. No specific abnormal lymph node enlargement identified in the abdomen and pelvis. Reproductive: Absence of the uterus and ovaries. Other: Once again there is a pelvic soft tissue nodule anteriorly on series 2, image 104 which on the prior measured 2.1 x 1.6 cm and today 1.9 x 1.3 cm. Smaller foci more caudal on axial image 111 and 112 are stable. Musculoskeletal: Curvature and degenerative changes are seen along the spine. Degenerative changes along the pelvis. IMPRESSION: Stable pelvic peritoneal nodules. No new lymph node enlargement seen in the chest, abdomen or pelvis. New tiny pleural effusions, left-greater-than-right with some ill-defined parenchymal opacities along the lower lobes. Although there is a differential this could be infectious or inflammatory. Recommend short follow-up and correlation to specific symptoms. Previous tiny lung nodules are stable. Continued follow-up surveillance as per the patient's neoplasm. Mild splenomegaly. Worsening bladder wall thickening and stranding. Please correlate with symptoms and treatment Electronically Signed   By: Karen Kays M.D.   On: 03/03/2023 18:23      04/05/2023  Imaging   Normal bone density scan   06/21/2023 Imaging   CT CHEST ABDOMEN PELVIS W CONTRAST  Result Date: 06/20/2023 CLINICAL DATA:  Endometrial cancer restaging, chemotherapy complete, ongoing oral chemotherapy * Tracking Code: BO * EXAM: CT CHEST, ABDOMEN, AND PELVIS WITH CONTRAST TECHNIQUE: Multidetector CT imaging of the chest, abdomen and pelvis was performed following the standard protocol during bolus administration of intravenous contrast. RADIATION DOSE REDUCTION: This exam was performed according to the departmental dose-optimization program which includes automated exposure control, adjustment of the mA and/or kV according to patient size and/or use of iterative reconstruction technique. CONTRAST:  OMNIPAQUE IOHEXOL 300 MG/ML  SOLN COMPARISON:  CT chest angiogram, 03/23/2023, CT chest abdomen pelvis, 03/02/2023 FINDINGS: CT CHEST FINDINGS Cardiovascular: No significant vascular findings. Normal heart size. No pericardial effusion. Mediastinum/Nodes: No enlarged mediastinal, hilar, or axillary lymph nodes. Thyroid gland, trachea, and esophagus demonstrate no significant findings. Lungs/Pleura: Interval resolution of previously seen pleural effusions. Significant interval improvement of previously seen heterogeneous and ground-glass airspace opacity, with small residual opacities present in the lower lobes (series 4, image 68). Multiple small subpleural nodules are unchanged, largest in the dependent right lower lobe measuring 0.9 cm (series 4, image 75). Musculoskeletal: No chest wall abnormality. No acute  osseous findings. CT ABDOMEN PELVIS FINDINGS Hepatobiliary: No solid liver abnormality is seen. Benign simple cyst of the liver dome, requiring no further follow-up or characterization (series 2, image 45). No gallstones, gallbladder wall thickening, or biliary dilatation. Pancreas: Unremarkable. No pancreatic ductal dilatation or surrounding inflammatory changes. Spleen: Normal in size without  significant abnormality. Adrenals/Urinary Tract: Adrenal glands are unremarkable. Simple, benign right renal cortical cysts, for which no further follow-up or characterization is required. Kidneys are otherwise normal, without renal calculi, solid lesion, or hydronephrosis. Bladder is unremarkable. Stomach/Bowel: Stomach is within normal limits. Appendix appears normal. No evidence of bowel wall thickening, distention, or inflammatory changes. Moderate burden of stool throughout the colon and rectum. Vascular/Lymphatic: No significant vascular findings are present. No enlarged abdominal or pelvic lymph nodes. Reproductive: Status post hysterectomy. Other: No abdominal wall hernia or abnormality. No ascites. Largest soft tissue nodule in the low midline abdomen is increased in size, measuring 2.8 x 2.5 cm, previously 2.4 x 2.1 cm (series 2, image 105). Multiple additional soft tissue nodules throughout the pelvis are unchanged, largest in the anterior right pelvis measuring 1.6 x 1.2 cm (series 2, image 112). Additional nodule at the right aspect of the vaginal cuff measures 2.0 x 1.0 cm (series 2, image 112). Musculoskeletal: No acute osseous findings. IMPRESSION: 1. Largest peritoneal soft tissue nodule in the low midline abdomen is increased in size, consistent with worsened peritoneal metastatic disease. Multiple additional soft tissue nodules throughout the pelvis are unchanged. 2. 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 3. Interval resolution of previously seen pleural effusions. 4. Significant interval improvement of previously seen heterogeneous and ground-glass airspace opacity, with small residual opacities present in the lower lobes. Findings are consistent with improved nonspecific infection or inflammation. 5. Status post hysterectomy. Electronically Signed   By: Jearld Lesch M.D.   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 Follow-up. * Tracking Code: BO * EXAM: CT CHEST, ABDOMEN, AND PELVIS WITH CONTRAST TECHNIQUE: Multidetector CT imaging of the chest, abdomen and pelvis was performed following the standard protocol during bolus administration of intravenous contrast. RADIATION DOSE REDUCTION: This exam was performed according to the departmental dose-optimization program which includes automated exposure control, adjustment of the mA and/or kV according to patient size and/or use of iterative reconstruction technique. CONTRAST:  OMNIPAQUE IOHEXOL 300 MG/ML  SOLN COMPARISON:  Multiple priors including most recent CT June 18, 2023 FINDINGS: CT CHEST FINDINGS Cardiovascular: Aortic atherosclerosis. Accessed right chest Port-A-Cath with tip at the superior cavoatrial junction. No central pulmonary embolus on this nondedicated study. Normal size heart. No significant pericardial effusion/thickening. Mediastinum/Nodes: No suspicious thyroid nodule. No pathologically enlarged mediastinal, hilar or axillary lymph nodes. Small hiatal hernia. Lungs/Pleura: Bilateral solid pulmonary nodules, some of which are stable in size others are increased in size. For reference: -left upper lobe pulmonary nodule measuring 6 mm on image 77/2 previously measured 3 mm. -subpleural right lower lobe pulmonary nodule measures 9 mm on image 77/7, unchanged. A few new scattered tiny pulmonary nodules for instance a 3 mm pulmonary nodule in the right lower lobe on image 87/7. Musculoskeletal: No aggressive lytic or blastic lesion of bone. Multilevel degenerative changes spine. CT ABDOMEN PELVIS FINDINGS Hepatobiliary: Stable 9 mm hypodense lesion in the dome of the liver previously characterized as a cyst on MRI July 04, 2021. No suspicious hepatic lesion. Gallbladder is  unremarkable. No biliary ductal dilation. Pancreas: No pancreatic ductal dilation or evidence of  acute inflammation. Spleen: No splenomegaly. Adrenals/Urinary Tract: Bilateral adrenal glands appear normal. No hydronephrosis. Stomach/Bowel: No radiopaque enteric contrast material was administered. Stomach is unremarkable for degree of distension. Normal appendix. No pathologic dilation of small or large bowel. Vascular/Lymphatic: Aortic atherosclerosis. Normal caliber abdominal aorta. Smooth IVC contours. The portal, splenic and superior mesenteric veins are patent. No pathologically enlarged abdominal or pelvic lymph nodes. Reproductive: Prior hysterectomy with increased size of the vaginal cuff nodules previously indexed nodule in the right vaginal cuff measures 3.2 cm on image 116/2 previously 2.0 cm. Other: Increased size and number of peritoneal/omental soft tissue implants. For reference: -previously indexed soft tissue nodule in the anterior pelvis now measures 5.7 x 3.6 cm on image 105/2 previously 2.8 x 2.5 cm. -new pelvic soft tissue nodule measures 2.3 cm on image 106/2. Trace free fluid in the pelvis with a new walled-off pelvic fluid collection measuring 3.9 cm adjacent to a soft tissue implant on image 105/2. Musculoskeletal: No aggressive lytic or blastic lesion of bone. IMPRESSION: 1. Worsening peritoneal carcinomatosis, increased size of the vaginal cuff nodules, and increased size/of bilateral solid pulmonary nodules, consistent with worsening metastatic disease. 2. Aortic Atherosclerosis (ICD10-I70.0). Electronically Signed   By: Maudry Mayhew M.D.   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       PHYSICAL EXAMINATION: ECOG PERFORMANCE STATUS: 1 - Symptomatic but completely ambulatory  Vitals:   11/04/23 1109  BP: 130/67  Pulse: 100  Resp: 18  Temp: 98.5 F (36.9 C)  SpO2: 98%   Filed Weights   11/04/23 1109  Weight: 192 lb (87.1 kg)     GENERAL:alert, no distress and comfortable  NEURO: alert & oriented x 3 with fluent speech, no focal motor/sensory deficits  LABORATORY DATA:  I have reviewed the data as listed    Component Value Date/Time   NA 138 11/04/2023 1052   K 4.2 11/04/2023 1052   CL 106 11/04/2023 1052   CO2 28 11/04/2023 1052   GLUCOSE 108 (H) 11/04/2023 1052   BUN 10 11/04/2023 1052   CREATININE 0.52 11/04/2023 1052   CALCIUM 8.8 (L) 11/04/2023 1052   PROT 5.8 (L) 11/04/2023 1052   ALBUMIN 3.4 (L) 11/04/2023 1052   AST 17 11/04/2023 1052   ALT 15 11/04/2023 1052   ALKPHOS 101 11/04/2023 1052   BILITOT 0.3 11/04/2023 1052   GFRNONAA >60 11/04/2023 1052    No results found for: "SPEP", "UPEP"  Lab Results  Component Value Date   WBC 10.9 (H) 11/04/2023   NEUTROABS 6.6 11/04/2023   HGB 11.6 (L) 11/04/2023   HCT 34.3 (L) 11/04/2023   MCV 93.7 11/04/2023   PLT 138 (L) 11/04/2023      Chemistry      Component Value Date/Time   NA 138 11/04/2023 1052   K 4.2 11/04/2023 1052   CL 106 11/04/2023 1052   CO2 28 11/04/2023 1052   BUN 10 11/04/2023 1052   CREATININE 0.52 11/04/2023 1052      Component Value Date/Time   CALCIUM 8.8 (L) 11/04/2023 1052   ALKPHOS 101 11/04/2023 1052   AST 17 11/04/2023 1052   ALT 15 11/04/2023 1052   BILITOT 0.3 11/04/2023 1052

## 2023-11-04 NOTE — Assessment & Plan Note (Signed)
So far, she tolerated treatment well without major side effects Her blood count is stable We will proceed with treatment as scheduled I plan to repeat imaging study after 3 cycles of chemotherapy

## 2023-11-04 NOTE — Assessment & Plan Note (Signed)
She has history of intermittent mucositis We will continue Magic mouthwash with lidocaine as needed

## 2023-11-04 NOTE — Patient Instructions (Signed)
 [facility] A DEPT OF [facility]  Discharge Instructions: Thank you for choosing [facility] to provide your oncology and hematology care.   If you have a lab appointment with the Cancer Center, please go directly to the Cancer Center and check in at the registration area.   Wear comfortable clothing and clothing appropriate for easy access to any Portacath or PICC line.   We strive to give you quality time with your provider. You may need to reschedule your appointment if you arrive late (15 or more minutes).  Arriving late affects you and other patients whose appointments are after yours.  Also, if you miss three or more appointments without notifying the office, you may be dismissed from the clinic at the provider's discretion.      For prescription refill requests, have your pharmacy contact our office and allow 72 hours for refills to be completed.    Today you received the following chemotherapy and/or immunotherapy agents gemcitabine      To help prevent nausea and vomiting after your treatment, we encourage you to take your nausea medication as directed.  BELOW ARE SYMPTOMS THAT SHOULD BE REPORTED IMMEDIATELY: *FEVER GREATER THAN 100.4 F (38 C) OR HIGHER *CHILLS OR SWEATING *NAUSEA AND VOMITING THAT IS NOT CONTROLLED WITH YOUR NAUSEA MEDICATION *UNUSUAL SHORTNESS OF BREATH *UNUSUAL BRUISING OR BLEEDING *URINARY PROBLEMS (pain or burning when urinating, or frequent urination) *BOWEL PROBLEMS (unusual diarrhea, constipation, pain near the anus) TENDERNESS IN MOUTH AND THROAT WITH OR WITHOUT PRESENCE OF ULCERS (sore throat, sores in mouth, or a toothache) UNUSUAL RASH, SWELLING OR PAIN  UNUSUAL VAGINAL DISCHARGE OR ITCHING   Items with * indicate a potential emergency and should be followed up as soon as possible or go to the Emergency Department if any problems should occur.  Please show the CHEMOTHERAPY ALERT CARD or IMMUNOTHERAPY  ALERT CARD at check-in to the Emergency Department and triage nurse.  Should you have questions after your visit or need to cancel or reschedule your appointment, please contact [facility]  Dept: [phone]  and follow the prompts.  Office hours are 8:00 a.m. to 4:30 p.m. Monday - Friday. Please note that voicemails left after 4:00 p.m. may not be returned until the following business day.  We are closed weekends and major holidays. You have access to a nurse at all times for urgent questions. Please call the main number to the clinic Dept: [phone] and follow the prompts.   For any non-urgent questions, you may also contact your provider using MyChart. We now offer e-Visits for anyone 59 and older to request care online for non-urgent symptoms. For details visit [email or web address].   Also download the MyChart app! Go to the app store, search "MyChart", open the app, select [place], and log in with your MyChart username and password.

## 2023-11-05 ENCOUNTER — Other Ambulatory Visit: Payer: Managed Care, Other (non HMO)

## 2023-11-05 ENCOUNTER — Ambulatory Visit: Payer: Managed Care, Other (non HMO) | Admitting: Hematology and Oncology

## 2023-11-05 ENCOUNTER — Ambulatory Visit: Payer: Managed Care, Other (non HMO)

## 2023-11-16 ENCOUNTER — Ambulatory Visit: Payer: Managed Care, Other (non HMO)

## 2023-11-16 ENCOUNTER — Other Ambulatory Visit: Payer: Managed Care, Other (non HMO)

## 2023-11-16 ENCOUNTER — Telehealth: Payer: Self-pay | Admitting: *Deleted

## 2023-11-16 ENCOUNTER — Telehealth: Payer: Self-pay | Admitting: Oncology

## 2023-11-16 NOTE — Telephone Encounter (Signed)
 Due to pt being diagnosed with RSV, advised pt that she would have all treatments and office visits in the infusion room in the bed. Advised pt to stay in care and call infusion room with number provided and make them aware that she has arrived. Pt verbalized understanding. Provider will be made aware of changes.

## 2023-11-16 NOTE — Telephone Encounter (Signed)
 Leah Diaz left a message that she has been diagnosed with RSV which she thinks she got from her grandson.  She has a bad cough and a low grade fever.  She is wondering if she needs to reschedule her infusion appointment on 11/18/23.

## 2023-11-18 ENCOUNTER — Other Ambulatory Visit: Payer: Self-pay | Admitting: Hematology and Oncology

## 2023-11-18 ENCOUNTER — Ambulatory Visit: Payer: Managed Care, Other (non HMO)

## 2023-11-18 ENCOUNTER — Ambulatory Visit: Payer: Managed Care, Other (non HMO) | Admitting: Hematology and Oncology

## 2023-11-18 ENCOUNTER — Inpatient Hospital Stay: Payer: 59

## 2023-11-18 ENCOUNTER — Inpatient Hospital Stay: Payer: 59 | Admitting: Hematology and Oncology

## 2023-11-18 ENCOUNTER — Encounter: Payer: Self-pay | Admitting: Hematology and Oncology

## 2023-11-18 ENCOUNTER — Other Ambulatory Visit: Payer: Managed Care, Other (non HMO)

## 2023-11-18 DIAGNOSIS — C549 Malignant neoplasm of corpus uteri, unspecified: Secondary | ICD-10-CM

## 2023-11-18 NOTE — Telephone Encounter (Signed)
 I recommend canceling appt due to her sickness and defer to 2 weeks

## 2023-11-18 NOTE — Telephone Encounter (Signed)
 Called Leah Diaz and let her know that we have canceled her appointments today due to RSV and the scheduler will call her with new appointment times.  Leah Diaz verbalized agreement and said she is feeling better but still has a cough.  She will wait to hear from the scheduler for the new appointment date and time.

## 2023-11-19 ENCOUNTER — Ambulatory Visit: Payer: Managed Care, Other (non HMO) | Admitting: Hematology and Oncology

## 2023-11-19 ENCOUNTER — Ambulatory Visit: Payer: Managed Care, Other (non HMO)

## 2023-11-19 ENCOUNTER — Other Ambulatory Visit: Payer: Managed Care, Other (non HMO)

## 2023-11-30 ENCOUNTER — Ambulatory Visit: Payer: Managed Care, Other (non HMO)

## 2023-11-30 ENCOUNTER — Ambulatory Visit: Payer: Managed Care, Other (non HMO) | Admitting: Hematology and Oncology

## 2023-11-30 ENCOUNTER — Other Ambulatory Visit: Payer: Managed Care, Other (non HMO)

## 2023-12-02 ENCOUNTER — Inpatient Hospital Stay: Payer: 59

## 2023-12-02 ENCOUNTER — Encounter: Payer: Self-pay | Admitting: Hematology and Oncology

## 2023-12-02 ENCOUNTER — Inpatient Hospital Stay: Payer: Medicare Other | Attending: Gynecologic Oncology | Admitting: Hematology and Oncology

## 2023-12-02 VITALS — BP 146/82 | HR 88 | Temp 98.2°F | Resp 18 | Ht 67.0 in | Wt 198.4 lb

## 2023-12-02 VITALS — BP 144/90 | Resp 16

## 2023-12-02 DIAGNOSIS — C539 Malignant neoplasm of cervix uteri, unspecified: Secondary | ICD-10-CM | POA: Diagnosis not present

## 2023-12-02 DIAGNOSIS — C7801 Secondary malignant neoplasm of right lung: Secondary | ICD-10-CM | POA: Diagnosis not present

## 2023-12-02 DIAGNOSIS — Z170 Estrogen receptor positive status [ER+]: Secondary | ICD-10-CM | POA: Insufficient documentation

## 2023-12-02 DIAGNOSIS — C549 Malignant neoplasm of corpus uteri, unspecified: Secondary | ICD-10-CM

## 2023-12-02 DIAGNOSIS — Z90722 Acquired absence of ovaries, bilateral: Secondary | ICD-10-CM | POA: Insufficient documentation

## 2023-12-02 DIAGNOSIS — Z90710 Acquired absence of both cervix and uterus: Secondary | ICD-10-CM | POA: Diagnosis not present

## 2023-12-02 DIAGNOSIS — C786 Secondary malignant neoplasm of retroperitoneum and peritoneum: Secondary | ICD-10-CM | POA: Insufficient documentation

## 2023-12-02 DIAGNOSIS — Z5111 Encounter for antineoplastic chemotherapy: Secondary | ICD-10-CM | POA: Diagnosis present

## 2023-12-02 DIAGNOSIS — C7802 Secondary malignant neoplasm of left lung: Secondary | ICD-10-CM | POA: Insufficient documentation

## 2023-12-02 LAB — CBC WITH DIFFERENTIAL (CANCER CENTER ONLY)
Abs Immature Granulocytes: 0.03 10*3/uL (ref 0.00–0.07)
Basophils Absolute: 0.1 10*3/uL (ref 0.0–0.1)
Basophils Relative: 1 %
Eosinophils Absolute: 0.2 10*3/uL (ref 0.0–0.5)
Eosinophils Relative: 2 %
HCT: 37.3 % (ref 36.0–46.0)
Hemoglobin: 12.7 g/dL (ref 12.0–15.0)
Immature Granulocytes: 0 %
Lymphocytes Relative: 26 %
Lymphs Abs: 3.3 10*3/uL (ref 0.7–4.0)
MCH: 31.8 pg (ref 26.0–34.0)
MCHC: 34 g/dL (ref 30.0–36.0)
MCV: 93.5 fL (ref 80.0–100.0)
Monocytes Absolute: 1.4 10*3/uL — ABNORMAL HIGH (ref 0.1–1.0)
Monocytes Relative: 11 %
Neutro Abs: 7.6 10*3/uL (ref 1.7–7.7)
Neutrophils Relative %: 60 %
Platelet Count: 324 10*3/uL (ref 150–400)
RBC: 3.99 MIL/uL (ref 3.87–5.11)
RDW: 15.2 % (ref 11.5–15.5)
WBC Count: 12.5 10*3/uL — ABNORMAL HIGH (ref 4.0–10.5)
nRBC: 0 % (ref 0.0–0.2)

## 2023-12-02 LAB — CMP (CANCER CENTER ONLY)
ALT: 13 U/L (ref 0–44)
AST: 16 U/L (ref 15–41)
Albumin: 3.6 g/dL (ref 3.5–5.0)
Alkaline Phosphatase: 104 U/L (ref 38–126)
Anion gap: 6 (ref 5–15)
BUN: 14 mg/dL (ref 8–23)
CO2: 30 mmol/L (ref 22–32)
Calcium: 10.3 mg/dL (ref 8.9–10.3)
Chloride: 103 mmol/L (ref 98–111)
Creatinine: 0.6 mg/dL (ref 0.44–1.00)
GFR, Estimated: >60 mL/min (ref >60)
Glucose, Bld: 96 mg/dL (ref 70–99)
Potassium: 4.3 mmol/L (ref 3.5–5.1)
Sodium: 139 mmol/L (ref 135–145)
Total Bilirubin: 0.3 mg/dL (ref 0.0–1.2)
Total Protein: 6.5 g/dL (ref 6.5–8.1)

## 2023-12-02 MED ORDER — SODIUM CHLORIDE 0.9 % IV SOLN
60.0000 mg/m2 | Freq: Once | INTRAVENOUS | Status: AC
  Administered 2023-12-02: 121 mg via INTRAVENOUS
  Filled 2023-12-02: qty 12.1

## 2023-12-02 MED ORDER — SODIUM CHLORIDE 0.9 % IV SOLN
600.0000 mg/m2 | Freq: Once | INTRAVENOUS | Status: AC
  Administered 2023-12-02: 1216 mg via INTRAVENOUS
  Filled 2023-12-02: qty 31.98

## 2023-12-02 MED ORDER — PROCHLORPERAZINE MALEATE 10 MG PO TABS
10.0000 mg | ORAL_TABLET | Freq: Once | ORAL | Status: AC
  Administered 2023-12-02: 10 mg via ORAL
  Filled 2023-12-02: qty 1

## 2023-12-02 MED ORDER — DEXAMETHASONE SODIUM PHOSPHATE 10 MG/ML IJ SOLN
10.0000 mg | Freq: Once | INTRAMUSCULAR | Status: AC
  Administered 2023-12-02: 10 mg via INTRAVENOUS
  Filled 2023-12-02: qty 1

## 2023-12-02 MED ORDER — SODIUM CHLORIDE 0.9% FLUSH
10.0000 mL | INTRAVENOUS | Status: DC | PRN
  Administered 2023-12-02: 10 mL

## 2023-12-02 MED ORDER — HEPARIN SOD (PORK) LOCK FLUSH 100 UNIT/ML IV SOLN
500.0000 [IU] | Freq: Once | INTRAVENOUS | Status: AC | PRN
  Administered 2023-12-02: 500 [IU]

## 2023-12-02 MED ORDER — SODIUM CHLORIDE 0.9 % IV SOLN: INTRAVENOUS | Status: DC

## 2023-12-02 MED ORDER — SODIUM CHLORIDE 0.9% FLUSH
10.0000 mL | Freq: Once | INTRAVENOUS | Status: AC
  Administered 2023-12-02: 10 mL

## 2023-12-02 NOTE — Assessment & Plan Note (Signed)
 So far, she tolerated treatment well without major side effects Her blood count is stable We will proceed with treatment as scheduled I plan to repeat imaging study after 3 cycles of chemotherapy

## 2023-12-02 NOTE — Patient Instructions (Signed)
 [facility]  Discharge Instructions: Thank you for choosing [facility] to provide your oncology and hematology care.   If you have a lab appointment with the Cancer Center, please go directly to the Cancer Center and check in at the registration area.   Wear comfortable clothing and clothing appropriate for easy access to any Portacath or PICC line.   We strive to give you quality time with your provider. You may need to reschedule your appointment if you arrive late (15 or more minutes).  Arriving late affects you and other patients whose appointments are after yours.  Also, if you miss three or more appointments without notifying the office, you may be dismissed from the clinic at the provider's discretion.      For prescription refill requests, have your pharmacy contact our office and allow 72 hours for refills to be completed.    Today you received the following chemotherapy and/or immunotherapy agents: Gemzar, Taxotere      To help prevent nausea and vomiting after your treatment, we encourage you to take your nausea medication as directed.  BELOW ARE SYMPTOMS THAT SHOULD BE REPORTED IMMEDIATELY: *FEVER GREATER THAN 100.4 F (38 C) OR HIGHER *CHILLS OR SWEATING *NAUSEA AND VOMITING THAT IS NOT CONTROLLED WITH YOUR NAUSEA MEDICATION *UNUSUAL SHORTNESS OF BREATH *UNUSUAL BRUISING OR BLEEDING *URINARY PROBLEMS (pain or burning when urinating, or frequent urination) *BOWEL PROBLEMS (unusual diarrhea, constipation, pain near the anus) TENDERNESS IN MOUTH AND THROAT WITH OR WITHOUT PRESENCE OF ULCERS (sore throat, sores in mouth, or a toothache) UNUSUAL RASH, SWELLING OR PAIN  UNUSUAL VAGINAL DISCHARGE OR ITCHING   Items with * indicate a potential emergency and should be followed up as soon as possible or go to the Emergency Department if any problems should occur.  Please show the CHEMOTHERAPY ALERT CARD or  IMMUNOTHERAPY ALERT CARD at check-in to the Emergency Department and triage nurse.  Should you have questions after your visit or need to cancel or reschedule your appointment, please contact [facility]  Dept: [phone]  and follow the prompts.  Office hours are 8:00 a.m. to 4:30 p.m. Monday - Friday. Please note that voicemails left after 4:00 p.m. may not be returned until the following business day.  We are closed weekends and major holidays. You have access to a nurse at all times for urgent questions. Please call the main number to the clinic Dept: [phone] and follow the prompts.   For any non-urgent questions, you may also contact your provider using MyChart. We now offer e-Visits for anyone 22 and older to request care online for non-urgent symptoms. For details visit [email or web address].   Also download the MyChart app! Go to the app store, search "MyChart", open the app, select [place], and log in with your MyChart username and password.

## 2023-12-02 NOTE — Progress Notes (Signed)
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   All questions were answered. The patient knows to call the clinic with any problems, questions or concerns. The total time spent in the appointment was 20 minutes encounter with patients including review of chart and various tests results, discussions about plan of care and coordination of care plan   Artis Delay, MD 12/02/2023 10:08 AM  INTERVAL HISTORY: Please see below for problem oriented charting. she returns for treatment follow-up She tolerated recent treatment well No side effects from treatment We discussed timing of next imaging She is concerned about receiving Neulasta at home  REVIEW OF SYSTEMS:   Constitutional: Denies fevers, chills or abnormal weight loss Eyes: Denies blurriness of vision Ears, nose, mouth, throat, and face: Denies mucositis or sore throat Respiratory: Denies cough, dyspnea or wheezes Cardiovascular: Denies palpitation, chest discomfort or lower extremity swelling Gastrointestinal:  Denies nausea, heartburn or change in bowel habits Skin: Denies abnormal skin rashes Lymphatics: Denies new lymphadenopathy or easy bruising Neurological:Denies numbness, tingling or new weaknesses Behavioral/Psych: Mood is stable, no new changes  All other systems were reviewed with the patient and are negative.  I have reviewed the past medical history, past surgical history,  social history and family history with the patient and they are unchanged from previous note.  ALLERGIES:  has no known allergies.  MEDICATIONS:  Current Outpatient Medications  Medication Sig Dispense Refill   acetaminophen (TYLENOL) 500 MG tablet Take 500 mg by mouth every 6 (six) hours as needed for mild pain, moderate pain or fever.     aspirin 81 MG EC tablet Take by mouth daily.     Cholecalciferol (VITAMIN D3 PO) Take 1 tablet by mouth daily.     dexamethasone (DECADRON) 4 MG tablet Take 2 tabs by mouth 2 times daily starting day before chemo with Taxotere. Then take 2 tabs daily for 2 days starting day after chemo. Take with food. 30 tablet 1   furosemide (LASIX) 20 MG tablet Take 1 tablet (20 mg total) by mouth as needed for fluid or edema. Only takes 2-3 days per week 30 tablet 3   lidocaine-prilocaine (EMLA) cream Apply to affected area once 30 g 3   loratadine (CLARITIN) 10 MG tablet Take 10 mg by mouth daily.     LORazepam (ATIVAN) 0.5 MG tablet Take 1 tablet (0.5 mg total) by mouth 2 (two) times daily as needed for anxiety. 10 tablet 0   losartan (COZAAR) 50 MG tablet Take 1 tablet (50 mg total) by mouth daily. 90 tablet 1   magic mouthwash (lidocaine, diphenhydrAMINE, alum & mag hydroxide) suspension Swish and spit 5 mLs by mouth 4 (four) times daily for 7 days. 160 mL 0   metoprolol tartrate (LOPRESSOR) 25 MG tablet Take 1 tablet (25 mg total) by mouth 2 (two) times daily. 180 tablet 1   Multiple Vitamin (MULTIVITAMIN WITH MINERALS) TABS tablet Take 1 tablet by mouth daily. 30 tablet 1  omeprazole (PRILOSEC) 20 MG capsule Take 1 capsule (20 mg total) by mouth 2 (two) times daily. 28 capsule 0   ondansetron (ZOFRAN) 8 MG tablet Take 1 tablet (8 mg total) by mouth every 8 (eight) hours as needed for nausea. 30 tablet 3   ondansetron (ZOFRAN) 8 MG tablet Take 1 tablet (8 mg total) by mouth every 8 (eight) hours as needed for nausea or vomiting. 30 tablet 1   pegfilgrastim  (NEULASTA) 6 MG/0.6ML injection Inject 0.6 mLs (6 mg total) into the skin every 28 (twenty-eight) days. 0.6 mL 0   prochlorperazine (COMPAZINE) 10 MG tablet Take 1 tablet (10 mg total) by mouth every 6 (six) hours as needed for nausea or vomiting. 30 tablet 1   No current facility-administered medications for this visit.   Facility-Administered Medications Ordered in Other Visits  Medication Dose Route Frequency Provider Last Rate Last Admin   0.9 %  sodium chloride infusion   Intravenous Continuous Artis Delay, MD 10 mL/hr at 12/02/23 0936 New Bag at 12/02/23 0936   DOCEtaxel (TAXOTERE) 121 mg in sodium chloride 0.9 % 250 mL chemo infusion  60 mg/m2 (Treatment Plan Recorded) Intravenous Once Artis Delay, MD       gemcitabine (GEMZAR) 1,216 mg in sodium chloride 0.9 % 250 mL chemo infusion  600 mg/m2 (Treatment Plan Recorded) Intravenous Once Bertis Ruddy, Kagen Kunath, MD       heparin lock flush 100 unit/mL  500 Units Intracatheter Once PRN Bertis Ruddy, Ogechi Kuehnel, MD       sodium chloride flush (NS) 0.9 % injection 10 mL  10 mL Intracatheter PRN Artis Delay, MD        SUMMARY OF ONCOLOGIC HISTORY: Oncology History Overview Note  High grade and LMS, 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 No extra uterine extension. No evidence of metastatic disease within the pelvis.   Mild thickening of the endometrial stripe, likely related to entrapment by the adjacent uterine mass   06/03/2021 Initial Diagnosis   Uterine sarcoma (HCC)   06/03/2021 Cancer Staging   Staging form: Corpus Uteri - Sarcoma, AJCC 7th Edition - Clinical stage from 06/03/2021: FIGO Stage IVB (rT1b, N0, M1) - Signed by Artis Delay,  MD on 03/31/2022 Diagnostic confirmation: Positive histology Stage prefix: Recurrence Biopsy of metastatic site performed: No Lymph-vascular invasion (LVI): LVI present/identified, NOS   06/03/2021 Pathology Results   FINAL MICROSCOPIC DIAGNOSIS:   A. UTERUS, CERVIX, BILATERAL FALLOPIAN TUBES AND OVARIES, HYSTERECTOMY:  - Uterus:       Mixed high grade uterine sarcoma, spanning 6 cm, see comment.       Extensive lymphovascular invasion.       See oncology table.  - Cervix: Benign squamous and endocervical mucosa. No dysplasia or  malignancy.  - Bilateral ovaries: Unremarkable. No malignancy.  - Bilateral fallopian tubes: Unremarkable. No malignancy.   ONCOLOGY TABLE:   UTERUS, SARCOMA: Resection   Procedure: Total hysterectomy and bilateral salpingo-oophorectomy  Specimen Integrity: Focally disrupted on posterior surface  Tumor Site: Anterior wall  Tumor Size: 6 cm  Histologic Type: High grade sarcoma, mixed. See comment.  Other Tissue/ Organ Involvement: Not identified  Lymphovascular Invasion: Present, extensive.  Margins: All margins negative for tumor  Regional Lymph Nodes: Not applicable (no lymph nodes submitted or  found)  Distant Metastasis:       Distant Site(s) Involved: Not applicable  Pathologic Stage Classification (pTNM, AJCC 8th Edition): pT1b, pN not  assigned  Ancillary Studies: Can be performed upon request  Representative Tumor Block: A6  Comment(s): The tumor has two morphologically different components. One component consists of malignant spindle cells which can be seen arising from the smooth muscle. These foci are positive for SMA, desmin, cyclinD1 (focal), and CD10 (patchy). The other component consists of round to slightly spindle cells with abundant admixed vessels and occasional large pleomorphic cells. These areas are positive for SMA (patchy weak), desmin (focal), CD10 (variable with diffuse areas). Pancytokeratin is negative. 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   ADDENDUM:   PROGNOSTIC INDICATOR RESULTS:   Immunohistochemical and morphometric analysis performed manually    Estrogen Receptor:       POSITIVE, 50%, WEAK TO MODERATE STAINING  Progesterone Receptor:   POSITIVE, 50%, MODERATE TO STRONG STAINING   Reference Range Estrogen and Progesterone Receptor       Negative  0%       Positive  >1%    06/03/2021 Surgery   Surgeon: Quinn Axe    Assistants: Dr Antionette Char (an MD assistant was necessary for tissue manipulation, management of robotic instrumentation, retraction and positioning due to the complexity of the case and hospital policies).  Operation: Robotic-assisted laparoscopic total hysterectomy >250gm with bilateral salpingoophorectomy, minilaparotomy for specimen delivery   Surgeon: Quinn Axe    Operative Findings:  : Bulky 16cm uterus with intra-uterine mass (consistent with a fibroid-like mass) , smooth normal appearing serosa, no suspicious bulky nodes. Normal ovaries bilaterally. Normal upper abdomen. Uterus too large to deliver vaginally in tact.    06/18/2021 Imaging   1. Multiple bilateral pulmonary nodules, measuring up to 9 mm. Most of these are perifissural and subpleural in location, likely lymph nodes. Nevertheless, close follow-up recommended to exclude metastatic disease. 2. 11 mm hypoattenuating lesion towards the dome of the liver, indeterminate. MRI abdomen with and without contrast could be used to further evaluate as clinically warranted. 3. Aortic Atherosclerosis (ICD10-I70.0).   10/13/2021 Imaging   1. No acute findings within the abdomen or pelvis. No specific findings identified to suggest residual or recurrence of tumor. 2. Stable small pulmonary nodules within the left lower lobe.   03/18/2022 Imaging   1. Interval development of 2 soft tissue nodules in the anterior pelvis measuring 10 mm and  7 mm respectively. Close follow-up recommended to exclude metastatic disease. PET-CT may be warranted to further evaluate. 2. Development of 2 mm left upper lobe parenchymal nodule, nonspecific. Close attention on follow-up imaging recommended  3. Stable appearance of index bilateral pulmonary nodules identified previously. 4. Aortic Atherosclerosis (ICD10-I70.0).   04/02/2022 PET scan   1. Signs of local tumor recurrence noted at the vaginal cuff. 2. 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 New since 10/13/2021 and progressive when compared with 03/17/2022. 3. New focal area of increased uptake within the right biceps femoris muscle which is suspicious for skeletal muscle involvement. 4. Stable appearance of small pulmonary nodules described on 03/17/2022. These are too small to characterize by PET-CT.   04/09/2022 Procedure   Successful placement of a right IJ approach Power Port with ultrasound and fluoroscopic guidance. The catheter is ready for use.   04/09/2022 Echocardiogram    1. Left ventricular ejection fraction, by estimation, is 65 to  70%. The left ventricle has normal function. The left ventricle has no regional wall motion abnormalities. There is mild concentric left ventricular hypertrophy. Left ventricular diastolic parameters are consistent with Grade I diastolic dysfunction (impaired relaxation).  2. Right ventricular systolic function is normal. The right ventricular size is normal.  3. The mitral valve is normal in structure. No evidence of mitral valve regurgitation.  4. The aortic valve is tricuspid. Aortic valve regurgitation is not visualized. No aortic stenosis is present.  5. The inferior vena cava is normal in size with greater than 50% respiratory variability, suggesting right atrial pressure of 3 mmHg.   04/16/2022 - 05/28/2022 Chemotherapy   Patient is on Treatment Plan : UTERINE LEIOMYOSARCOMA Doxorubicin q21d x 6 Cycles      06/10/2022 Imaging   1. Status post hysterectomy and bilateral oophorectomy. Multiple newand enlarged peritoneal nodules throughout the low abdomen and pelvis, previously FDG avid and consistent with worsened locally recurrent and peritoneal metastatic disease. 2. Multiple small bilateral pulmonary nodules, several of which are slightly enlarged compared to prior examination, consistent with worsened pulmonary metastatic disease.   Aortic Atherosclerosis (ICD10-I70.0).     06/25/2022 - 03/04/2023 Chemotherapy   Patient is on Treatment Plan : UTERINE UNDIFFERENTIATED LEIOMYOSARCOMA Gemcitabine D1,8 + Docetaxel D8 (900/100) q21d     09/02/2022 Imaging   1. Moderate response to therapy of peritoneal metastasis within the anterior pelvis. 2. Minimal response to therapy of pulmonary metastasis. 3. No new or progressive disease. 4.  Aortic Atherosclerosis (ICD10-I70.0).     12/01/2022 Imaging   1. No new or progressive findings in the chest, abdomen, or pelvis. 2. Peritoneal metastases in the pelvis described previously measure smaller today compatible with interval response to therapy. 3. Stable tiny bilateral pulmonary nodules. Previously characterized as metastatic lesions, these are unchanged in the interval. No new suspicious pulmonary nodule or mass. 4.  Aortic Atherosclerois (ICD10-170.0)   03/04/2023 Imaging   CT CHEST ABDOMEN PELVIS W CONTRAST  Result Date: 03/03/2023 CLINICAL DATA:  Cervical cancer * Tracking Code: BO *. Assess treatment response. Current chemotherapy. Prior total hysterectomy EXAM: CT CHEST, ABDOMEN, AND PELVIS WITH CONTRAST TECHNIQUE: Multidetector CT imaging of the chest, abdomen and pelvis was performed following the standard protocol during bolus administration of intravenous contrast. RADIATION DOSE REDUCTION: This exam was performed according to the departmental dose-optimization program which includes automated exposure control, adjustment of the mA and/or kV  according to patient size and/or use of iterative reconstruction technique. CONTRAST:  OMNIPAQUE IOHEXOL 300 MG/ML  SOLN COMPARISON:  CT 12/01/2022 and older FINDINGS: CT CHEST FINDINGS Cardiovascular: Right upper chest port. Heart is nonenlarged. Trace pericardial fluid. Normal caliber thoracic aorta. Mediastinum/Nodes: No specific abnormal lymph node enlargement seen in the axillary region, hilum or mediastinum. Mildly patulous thoracic esophagus. Thyroid gland is unremarkable. Lungs/Pleura: Tiny pleural effusions are seen, left-greater-than-right. These new from previous. There is some patchy parenchymal opacity identified in the superior segment of the lower lobes, left-greater-than-right. This could be infiltrative rather than neoplastic. Recommend short follow-up. There were some small nodules identified on the prior. For example 3 mm nodule lateral left lower lobe subpleural on series 7, image 91 is stable. Right lower lobe subpleural nodule on the prior which measured 9 x 6 mm, today is stable on image 73 of series 7. Stable 2-3 mm nodule right lower lobe on series 7, image 97. Musculoskeletal: Curvature of the spine with some degenerative changes. CT ABDOMEN PELVIS FINDINGS Hepatobiliary: Stable dome hepatic cyst. No new space-occupying  lesion in the liver. Patent portal vein. Gallbladder is distended. Pancreas: Unremarkable. No pancreatic ductal dilatation or surrounding inflammatory changes. Spleen: Spleen is slightly enlarged at 13.1 cm in AP length. Preserved enhancement. Small splenule inferiorly. Adrenals/Urinary Tract: The adrenal glands are preserved. Tiny low-attenuation cystic lesions are seen which are too small to completely characterize along the kidneys. Bosniak 2 lesions. No specific imaging follow-up. The ureters have normal course and caliber down to the bladder. Bladder wall is diffusely thickened. The bladder is contracted. There is significant stranding. This is progressive from  the previous examination. Stomach/Bowel: Large bowel has a normal course and caliber with scattered stool. Overall moderate stool burden. Normal appendix extends inferior to the cecum in the right lower quadrant. The stomach and small bowel are nondilated. Vascular/Lymphatic: Normal caliber aorta and IVC with some vascular calcifications. No specific abnormal lymph node enlargement identified in the abdomen and pelvis. Reproductive: Absence of the uterus and ovaries. Other: Once again there is a pelvic soft tissue nodule anteriorly on series 2, image 104 which on the prior measured 2.1 x 1.6 cm and today 1.9 x 1.3 cm. Smaller foci more caudal on axial image 111 and 112 are stable. Musculoskeletal: Curvature and degenerative changes are seen along the spine. Degenerative changes along the pelvis. IMPRESSION: Stable pelvic peritoneal nodules. No new lymph node enlargement seen in the chest, abdomen or pelvis. New tiny pleural effusions, left-greater-than-right with some ill-defined parenchymal opacities along the lower lobes. Although there is a differential this could be infectious or inflammatory. Recommend short follow-up and correlation to specific symptoms. Previous tiny lung nodules are stable. Continued follow-up surveillance as per the patient's neoplasm. Mild splenomegaly. Worsening bladder wall thickening and stranding. Please correlate with symptoms and treatment Electronically Signed   By: Karen Kays M.D.   On: 03/03/2023 18:23      04/05/2023 Imaging   Normal bone density scan   06/21/2023 Imaging   CT CHEST ABDOMEN PELVIS W CONTRAST  Result Date: 06/20/2023 CLINICAL DATA:  Endometrial cancer restaging, chemotherapy complete, ongoing oral chemotherapy * Tracking Code: BO * EXAM: CT CHEST, ABDOMEN, AND PELVIS WITH CONTRAST TECHNIQUE: Multidetector CT imaging of the chest, abdomen and pelvis was performed following the standard protocol during bolus administration of intravenous contrast. RADIATION  DOSE REDUCTION: This exam was performed according to the departmental dose-optimization program which includes automated exposure control, adjustment of the mA and/or kV according to patient size and/or use of iterative reconstruction technique. CONTRAST:  OMNIPAQUE IOHEXOL 300 MG/ML  SOLN COMPARISON:  CT chest angiogram, 03/23/2023, CT chest abdomen pelvis, 03/02/2023 FINDINGS: CT CHEST FINDINGS Cardiovascular: No significant vascular findings. Normal heart size. No pericardial effusion. Mediastinum/Nodes: No enlarged mediastinal, hilar, or axillary lymph nodes. Thyroid gland, trachea, and esophagus demonstrate no significant findings. Lungs/Pleura: Interval resolution of previously seen pleural effusions. Significant interval improvement of previously seen heterogeneous and ground-glass airspace opacity, with small residual opacities present in the lower lobes (series 4, image 68). Multiple small subpleural nodules are unchanged, largest in the dependent right lower lobe measuring 0.9 cm (series 4, image 75). Musculoskeletal: No chest wall abnormality. No acute osseous findings. CT ABDOMEN PELVIS FINDINGS Hepatobiliary: No solid liver abnormality is seen. Benign simple cyst of the liver dome, requiring no further follow-up or characterization (series 2, image 45). No gallstones, gallbladder wall thickening, or biliary dilatation. Pancreas: Unremarkable. No pancreatic ductal dilatation or surrounding inflammatory changes. Spleen: Normal in size without significant abnormality. Adrenals/Urinary Tract: Adrenal glands are unremarkable. Simple, benign right renal cortical  cysts, for which no further follow-up or characterization is required. Kidneys are otherwise normal, without renal calculi, solid lesion, or hydronephrosis. Bladder is unremarkable. Stomach/Bowel: Stomach is within normal limits. Appendix appears normal. No evidence of bowel wall thickening, distention, or inflammatory changes. Moderate burden  of stool throughout the colon and rectum. Vascular/Lymphatic: No significant vascular findings are present. No enlarged abdominal or pelvic lymph nodes. Reproductive: Status post hysterectomy. Other: No abdominal wall hernia or abnormality. No ascites. Largest soft tissue nodule in the low midline abdomen is increased in size, measuring 2.8 x 2.5 cm, previously 2.4 x 2.1 cm (series 2, image 105). Multiple additional soft tissue nodules throughout the pelvis are unchanged, largest in the anterior right pelvis measuring 1.6 x 1.2 cm (series 2, image 112). Additional nodule at the right aspect of the vaginal cuff measures 2.0 x 1.0 cm (series 2, image 112). Musculoskeletal: No acute osseous findings. IMPRESSION: 1. Largest peritoneal soft tissue nodule in the low midline abdomen is increased in size, consistent with worsened peritoneal metastatic disease. Multiple additional soft tissue nodules throughout the pelvis are unchanged. 2. 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 3. Interval resolution of previously seen pleural effusions. 4. Significant interval improvement of previously seen heterogeneous and ground-glass airspace opacity, with small residual opacities present in the lower lobes. Findings are consistent with improved nonspecific infection or inflammation. 5. Status post hysterectomy. Electronically Signed   By: Jearld Lesch M.D.   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 * Tracking Code: BO * EXAM: CT CHEST, ABDOMEN, AND PELVIS WITH CONTRAST TECHNIQUE: Multidetector CT imaging of the chest, abdomen and pelvis was performed following the standard protocol during bolus administration of intravenous contrast. RADIATION DOSE REDUCTION: This exam was performed according to the departmental dose-optimization program which includes automated  exposure control, adjustment of the mA and/or kV according to patient size and/or use of iterative reconstruction technique. CONTRAST:  OMNIPAQUE IOHEXOL 300 MG/ML  SOLN COMPARISON:  Multiple priors including most recent CT June 18, 2023 FINDINGS: CT CHEST FINDINGS Cardiovascular: Aortic atherosclerosis. Accessed right chest Port-A-Cath with tip at the superior cavoatrial junction. No central pulmonary embolus on this nondedicated study. Normal size heart. No significant pericardial effusion/thickening. Mediastinum/Nodes: No suspicious thyroid nodule. No pathologically enlarged mediastinal, hilar or axillary lymph nodes. Small hiatal hernia. Lungs/Pleura: Bilateral solid pulmonary nodules, some of which are stable in size others are increased in size. For reference: -left upper lobe pulmonary nodule measuring 6 mm on image 77/2 previously measured 3 mm. -subpleural right lower lobe pulmonary nodule measures 9 mm on image 77/7, unchanged. A few new scattered tiny pulmonary nodules for instance a 3 mm pulmonary nodule in the right lower lobe on image 87/7. Musculoskeletal: No aggressive lytic or blastic lesion of bone. Multilevel degenerative changes spine. CT ABDOMEN PELVIS FINDINGS Hepatobiliary: Stable 9 mm hypodense lesion in the dome of the liver previously characterized as a cyst on MRI July 04, 2021. No suspicious hepatic lesion. Gallbladder is unremarkable. No biliary ductal dilation. Pancreas: No pancreatic ductal dilation or evidence of acute inflammation. Spleen: No splenomegaly. Adrenals/Urinary Tract: Bilateral adrenal glands appear normal. No hydronephrosis. Stomach/Bowel: No radiopaque enteric contrast material was administered. Stomach is unremarkable for degree of distension. Normal appendix. No pathologic dilation of small or large bowel. Vascular/Lymphatic: Aortic atherosclerosis. Normal caliber abdominal aorta. Smooth IVC contours. The portal, splenic and  superior mesenteric veins are  patent. No pathologically enlarged abdominal or pelvic lymph nodes. Reproductive: Prior hysterectomy with increased size of the vaginal cuff nodules previously indexed nodule in the right vaginal cuff measures 3.2 cm on image 116/2 previously 2.0 cm. Other: Increased size and number of peritoneal/omental soft tissue implants. For reference: -previously indexed soft tissue nodule in the anterior pelvis now measures 5.7 x 3.6 cm on image 105/2 previously 2.8 x 2.5 cm. -new pelvic soft tissue nodule measures 2.3 cm on image 106/2. Trace free fluid in the pelvis with a new walled-off pelvic fluid collection measuring 3.9 cm adjacent to a soft tissue implant on image 105/2. Musculoskeletal: No aggressive lytic or blastic lesion of bone. IMPRESSION: 1. Worsening peritoneal carcinomatosis, increased size of the vaginal cuff nodules, and increased size/of bilateral solid pulmonary nodules, consistent with worsening metastatic disease. 2. Aortic Atherosclerosis (ICD10-I70.0). Electronically Signed   By: Maudry Mayhew M.D.   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       PHYSICAL EXAMINATION: ECOG PERFORMANCE STATUS: 1 - Symptomatic but completely ambulatory  Vitals:   12/02/23 0920  BP: (!) 146/82  Pulse: 88  Resp: 18  Temp: 98.2 F (36.8 C)  SpO2: 98%   Filed Weights   12/02/23 0920  Weight: 198 lb 6.4 oz (90 kg)    GENERAL:alert, no distress and comfortable  LABORATORY DATA:  I have reviewed the data as listed    Component Value Date/Time   NA 139 12/02/2023 0834   K 4.3 12/02/2023 0834   CL 103 12/02/2023 0834   CO2 30 12/02/2023 0834   GLUCOSE 96 12/02/2023 0834   BUN 14 12/02/2023 0834   CREATININE 0.60 12/02/2023 0834   CALCIUM 10.3 12/02/2023 0834   PROT 6.5 12/02/2023 0834   ALBUMIN 3.6 12/02/2023 0834   AST 16 12/02/2023 0834   ALT 13  12/02/2023 0834   ALKPHOS 104 12/02/2023 0834   BILITOT 0.3 12/02/2023 0834   GFRNONAA >60 12/02/2023 0834    No results found for: "SPEP", "UPEP"  Lab Results  Component Value Date   WBC 12.5 (H) 12/02/2023   NEUTROABS 7.6 12/02/2023   HGB 12.7 12/02/2023   HCT 37.3 12/02/2023   MCV 93.5 12/02/2023   PLT 324 12/02/2023      Chemistry      Component Value Date/Time   NA 139 12/02/2023 0834   K 4.3 12/02/2023 0834   CL 103 12/02/2023 0834   CO2 30 12/02/2023 0834   BUN 14 12/02/2023 0834   CREATININE 0.60 12/02/2023 0834      Component Value Date/Time   CALCIUM 10.3 12/02/2023 0834   ALKPHOS 104 12/02/2023 0834   AST 16 12/02/2023 0834   ALT 13 12/02/2023 0834   BILITOT 0.3 12/02/2023 0834

## 2023-12-03 ENCOUNTER — Other Ambulatory Visit: Payer: Managed Care, Other (non HMO)

## 2023-12-03 ENCOUNTER — Ambulatory Visit: Payer: Managed Care, Other (non HMO)

## 2023-12-03 ENCOUNTER — Ambulatory Visit: Payer: Managed Care, Other (non HMO) | Admitting: Hematology and Oncology

## 2023-12-09 ENCOUNTER — Other Ambulatory Visit: Payer: Self-pay

## 2023-12-14 ENCOUNTER — Ambulatory Visit: Payer: Managed Care, Other (non HMO) | Admitting: Hematology and Oncology

## 2023-12-14 ENCOUNTER — Ambulatory Visit: Payer: Managed Care, Other (non HMO)

## 2023-12-14 ENCOUNTER — Other Ambulatory Visit: Payer: Managed Care, Other (non HMO)

## 2023-12-15 ENCOUNTER — Telehealth: Payer: Self-pay | Admitting: Oncology

## 2023-12-15 ENCOUNTER — Other Ambulatory Visit: Payer: Self-pay

## 2023-12-15 NOTE — Telephone Encounter (Signed)
Called [name] back and let her know.

## 2023-12-15 NOTE — Telephone Encounter (Signed)
Leah Diaz called and said she had home oxygen after she had pneumonia in May.  She no longer needs the oxygen and is trying to have the equipment picked up.  She uses [employer] and they need a discontinue order faxed to them at [phone] before they can pick up the equipment.  Leah Diaz is wondering if we would be able to fax the order.

## 2023-12-15 NOTE — Telephone Encounter (Signed)
I will do it tomorrow when I see her

## 2023-12-16 ENCOUNTER — Inpatient Hospital Stay (HOSPITAL_BASED_OUTPATIENT_CLINIC_OR_DEPARTMENT_OTHER): Payer: 59 | Admitting: Hematology and Oncology

## 2023-12-16 ENCOUNTER — Inpatient Hospital Stay: Payer: Medicare Other

## 2023-12-16 ENCOUNTER — Inpatient Hospital Stay: Payer: 59

## 2023-12-16 ENCOUNTER — Encounter: Payer: Self-pay | Admitting: Hematology and Oncology

## 2023-12-16 ENCOUNTER — Ambulatory Visit: Payer: Managed Care, Other (non HMO)

## 2023-12-16 VITALS — BP 132/73 | HR 88 | Temp 99.0°F | Resp 18 | Ht 67.0 in | Wt 197.0 lb

## 2023-12-16 DIAGNOSIS — D6481 Anemia due to antineoplastic chemotherapy: Secondary | ICD-10-CM

## 2023-12-16 DIAGNOSIS — Z5111 Encounter for antineoplastic chemotherapy: Secondary | ICD-10-CM | POA: Diagnosis not present

## 2023-12-16 DIAGNOSIS — C549 Malignant neoplasm of corpus uteri, unspecified: Secondary | ICD-10-CM

## 2023-12-16 DIAGNOSIS — K279 Peptic ulcer, site unspecified, unspecified as acute or chronic, without hemorrhage or perforation: Secondary | ICD-10-CM

## 2023-12-16 DIAGNOSIS — B9681 Helicobacter pylori [H. pylori] as the cause of diseases classified elsewhere: Secondary | ICD-10-CM

## 2023-12-16 DIAGNOSIS — T451X5A Adverse effect of antineoplastic and immunosuppressive drugs, initial encounter: Secondary | ICD-10-CM | POA: Diagnosis not present

## 2023-12-16 LAB — CMP (CANCER CENTER ONLY)
ALT: 16 U/L (ref 0–44)
AST: 18 U/L (ref 15–41)
Albumin: 3.3 g/dL — ABNORMAL LOW (ref 3.5–5.0)
Alkaline Phosphatase: 97 U/L (ref 38–126)
Anion gap: 4 — ABNORMAL LOW (ref 5–15)
BUN: 11 mg/dL (ref 8–23)
CO2: 27 mmol/L (ref 22–32)
Calcium: 8.8 mg/dL — ABNORMAL LOW (ref 8.9–10.3)
Chloride: 106 mmol/L (ref 98–111)
Creatinine: 0.61 mg/dL (ref 0.44–1.00)
GFR, Estimated: >60 mL/min (ref >60)
Glucose, Bld: 116 mg/dL — ABNORMAL HIGH (ref 70–99)
Potassium: 4.3 mmol/L (ref 3.5–5.1)
Sodium: 137 mmol/L (ref 135–145)
Total Bilirubin: 0.3 mg/dL (ref 0.0–1.2)
Total Protein: 6 g/dL — ABNORMAL LOW (ref 6.5–8.1)

## 2023-12-16 LAB — CBC WITH DIFFERENTIAL (CANCER CENTER ONLY)
Abs Immature Granulocytes: 0.29 10*3/uL — ABNORMAL HIGH (ref 0.00–0.07)
Basophils Absolute: 0.2 10*3/uL — ABNORMAL HIGH (ref 0.0–0.1)
Basophils Relative: 2 %
Eosinophils Absolute: 0.2 10*3/uL (ref 0.0–0.5)
Eosinophils Relative: 3 %
HCT: 34.6 % — ABNORMAL LOW (ref 36.0–46.0)
Hemoglobin: 11.8 g/dL — ABNORMAL LOW (ref 12.0–15.0)
Immature Granulocytes: 4 %
Lymphocytes Relative: 31 %
Lymphs Abs: 2.2 10*3/uL (ref 0.7–4.0)
MCH: 32.1 pg (ref 26.0–34.0)
MCHC: 34.1 g/dL (ref 30.0–36.0)
MCV: 94 fL (ref 80.0–100.0)
Monocytes Absolute: 0.8 10*3/uL (ref 0.1–1.0)
Monocytes Relative: 12 %
Neutro Abs: 3.3 10*3/uL (ref 1.7–7.7)
Neutrophils Relative %: 48 %
Platelet Count: 259 10*3/uL (ref 150–400)
RBC: 3.68 MIL/uL — ABNORMAL LOW (ref 3.87–5.11)
RDW: 16.5 % — ABNORMAL HIGH (ref 11.5–15.5)
WBC Count: 7 10*3/uL (ref 4.0–10.5)
nRBC: 0 % (ref 0.0–0.2)

## 2023-12-16 MED ORDER — SODIUM CHLORIDE 0.9 % IV SOLN
INTRAVENOUS | Status: DC
Start: 2023-12-16 — End: 2023-12-16

## 2023-12-16 MED ORDER — OMEPRAZOLE 20 MG PO CPDR: 20.0000 mg | DELAYED_RELEASE_CAPSULE | Freq: Every day | ORAL | Status: DC

## 2023-12-16 MED ORDER — GEMCITABINE HCL CHEMO INJECTION 1 GM/26.3ML
600.0000 mg/m2 | Freq: Once | INTRAVENOUS | Status: AC
  Administered 2023-12-16: 1216 mg via INTRAVENOUS
  Filled 2023-12-16: qty 31.98

## 2023-12-16 MED ORDER — SODIUM CHLORIDE 0.9% FLUSH
10.0000 mL | Freq: Once | INTRAVENOUS | Status: AC
  Administered 2023-12-16: 10 mL

## 2023-12-16 MED ORDER — SODIUM CHLORIDE 0.9% FLUSH
10.0000 mL | INTRAVENOUS | Status: DC | PRN
  Administered 2023-12-16: 10 mL

## 2023-12-16 MED ORDER — HEPARIN SOD (PORK) LOCK FLUSH 100 UNIT/ML IV SOLN
500.0000 [IU] | Freq: Once | INTRAVENOUS | Status: AC | PRN
  Administered 2023-12-16: 500 [IU]

## 2023-12-16 MED ORDER — PROCHLORPERAZINE MALEATE 10 MG PO TABS: 10.0000 mg | ORAL_TABLET | Freq: Four times a day (QID) | ORAL | 1 refills | Status: AC | PRN

## 2023-12-16 MED ORDER — PROCHLORPERAZINE MALEATE 10 MG PO TABS
10.0000 mg | ORAL_TABLET | Freq: Once | ORAL | Status: AC
  Administered 2023-12-16: 10 mg via ORAL
  Filled 2023-12-16: qty 1

## 2023-12-16 NOTE — Telephone Encounter (Signed)
Order faxed to [employer] to discontinue home oxygen per Dr. Bertis Ruddy.

## 2023-12-16 NOTE — Assessment & Plan Note (Signed)
So far, she tolerated treatment well without major side effects Her blood count is stable We will proceed with treatment as scheduled I plan to repeat imaging study next month

## 2023-12-16 NOTE — Assessment & Plan Note (Signed)
This is likely due to recent treatment. The patient denies recent history of bleeding such as epistaxis, hematuria or hematochezia. She is asymptomatic from the anemia. I will observe for now.  She does not require transfusion now. I will continue the chemotherapy at current dose without dosage adjustment.  If the anemia gets progressive worse in the future, I might have to delay her treatment or adjust the chemotherapy dose.

## 2023-12-16 NOTE — Progress Notes (Signed)
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 The patient denies recent history of bleeding such as epistaxis, hematuria or hematochezia. She is asymptomatic from the anemia. I will observe for now.  She does not require transfusion now. I will continue the chemotherapy at current dose without dosage adjustment.  If the anemia gets progressive worse in the future, I might have to delay her treatment or adjust the chemotherapy dose.   Orders Placed This Encounter  Procedures   CT CHEST ABDOMEN PELVIS W CONTRAST    Standing Status:   Future    Expected Date:   01/06/2024    Expiration Date:   12/15/2024    If indicated for the ordered procedure, I authorize the administration of contrast media per Radiology protocol:   Yes    Does the patient have a contrast media/X-ray dye allergy?:   No    Preferred imaging location?:   [facility]    If indicated for the ordered procedure, I authorize the administration of oral contrast media per Radiology protocol:   Yes    All questions were answered. The patient knows to call the clinic with any problems, questions or concerns. The total time spent in the appointment was 30 minutes encounter with patients including review of chart and various tests results, discussions about plan of care and coordination of care plan   Artis Delay, MD 12/16/2023 9:23 AM  INTERVAL HISTORY: Please see below for  problem oriented charting. she returns for chemo follow-up She tolerated recent treatment well She denies peripheral neuropathy She wants her oxygen supplies removed and we will get order faxed to get her oxygen supplies removed Denies recent cough, chest pain or shortness of breath No peripheral edema  REVIEW OF SYSTEMS:   Constitutional: Denies fevers, chills or abnormal weight loss Eyes: Denies blurriness of vision Ears, nose, mouth, throat, and face: Denies mucositis or sore throat Respiratory: Denies cough, dyspnea or wheezes Cardiovascular: Denies palpitation, chest discomfort or lower extremity swelling Gastrointestinal:  Denies nausea, heartburn or change in bowel habits Skin: Denies abnormal skin rashes Lymphatics: Denies new lymphadenopathy or easy bruising Neurological:Denies numbness, tingling or new weaknesses Behavioral/Psych: Mood is stable, no new changes  All other systems were reviewed with the patient and are negative.  I have reviewed the past medical history, past surgical history, social history and family history with the patient and they are unchanged from previous note.  ALLERGIES:  has no known allergies.  MEDICATIONS:  Current Outpatient Medications  Medication Sig Dispense Refill   acetaminophen (TYLENOL) 500 MG tablet Take 500 mg by mouth every 6 (six) hours as needed for mild pain, moderate pain or fever.     aspirin 81 MG EC tablet Take by mouth daily.     Cholecalciferol (VITAMIN D3 PO) Take 1 tablet by mouth daily.     dexamethasone (DECADRON) 4 MG tablet Take 2 tabs by mouth 2 times daily starting day before chemo with Taxotere. Then take 2  tabs daily for 2 days starting day after chemo. Take with food. 30 tablet 1   furosemide (LASIX) 20 MG tablet Take 1 tablet (20 mg total) by mouth as needed for fluid or edema. Only takes 2-3 days per week 30 tablet 3   lidocaine-prilocaine (EMLA) cream Apply to affected area once 30 g 3   loratadine (CLARITIN)  10 MG tablet Take 10 mg by mouth daily.     LORazepam (ATIVAN) 0.5 MG tablet Take 1 tablet (0.5 mg total) by mouth 2 (two) times daily as needed for anxiety. 10 tablet 0   losartan (COZAAR) 50 MG tablet Take 1 tablet (50 mg total) by mouth daily. 90 tablet 1   magic mouthwash (lidocaine, diphenhydrAMINE, alum & mag hydroxide) suspension Swish and spit 5 mLs by mouth 4 (four) times daily for 7 days. 160 mL 0   metoprolol tartrate (LOPRESSOR) 25 MG tablet Take 1 tablet (25 mg total) by mouth 2 (two) times daily. 180 tablet 1   Multiple Vitamin (MULTIVITAMIN WITH MINERALS) TABS tablet Take 1 tablet by mouth daily. 30 tablet 1   omeprazole (PRILOSEC) 20 MG capsule Take 1 capsule (20 mg total) by mouth daily for 5000 doses.     ondansetron (ZOFRAN) 8 MG tablet Take 1 tablet (8 mg total) by mouth every 8 (eight) hours as needed for nausea or vomiting. 30 tablet 1   pegfilgrastim (NEULASTA) 6 MG/0.6ML injection Inject 0.6 mLs (6 mg total) into the skin every 28 (twenty-eight) days. 0.6 mL 0   prochlorperazine (COMPAZINE) 10 MG tablet Take 1 tablet (10 mg total) by mouth every 6 (six) hours as needed for nausea or vomiting. 60 tablet 1   No current facility-administered medications for this visit.   Facility-Administered Medications Ordered in Other Visits  Medication Dose Route Frequency Provider Last Rate Last Admin   0.9 %  sodium chloride infusion   Intravenous Continuous Artis Delay, MD 10 mL/hr at 12/16/23 0914 New Bag at 12/16/23 0914   gemcitabine (GEMZAR) 1,216 mg in sodium chloride 0.9 % 250 mL chemo infusion  600 mg/m2 (Treatment Plan Recorded) Intravenous Once Bertis Ruddy, Casmer Yepiz, MD       heparin lock flush 100 unit/mL  500 Units Intracatheter Once PRN Bertis Ruddy, Tylan Kinn, MD       sodium chloride flush (NS) 0.9 % injection 10 mL  10 mL Intracatheter PRN Artis Delay, MD        SUMMARY OF ONCOLOGIC HISTORY: Oncology History Overview Note  High grade and LMS, 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 No extra uterine extension. No evidence of metastatic disease within the pelvis.   Mild thickening of the endometrial stripe, likely related to entrapment by the adjacent uterine mass   06/03/2021 Initial Diagnosis   Uterine sarcoma (HCC)   06/03/2021 Cancer Staging   Staging form: Corpus Uteri - Sarcoma, AJCC 7th Edition - Clinical stage from 06/03/2021: FIGO Stage IVB (rT1b, N0, M1) - Signed by Artis Delay, MD on 03/31/2022 Diagnostic confirmation: Positive histology Stage prefix: Recurrence Biopsy of metastatic site performed: No Lymph-vascular invasion (LVI): LVI present/identified, NOS   06/03/2021 Pathology Results   FINAL MICROSCOPIC DIAGNOSIS:   A. UTERUS, CERVIX, BILATERAL FALLOPIAN TUBES AND OVARIES, HYSTERECTOMY:  - Uterus:  Mixed high grade uterine sarcoma, spanning 6 cm, see comment.       Extensive lymphovascular invasion.       See oncology table.  - Cervix: Benign squamous and endocervical mucosa. No dysplasia or  malignancy.  - Bilateral ovaries: Unremarkable. No malignancy.  - Bilateral fallopian tubes: Unremarkable. No malignancy.   ONCOLOGY TABLE:   UTERUS, SARCOMA: Resection   Procedure: Total hysterectomy and bilateral salpingo-oophorectomy  Specimen Integrity: Focally disrupted on posterior surface  Tumor Site: Anterior wall  Tumor Size: 6 cm  Histologic Type: High grade sarcoma, mixed. See comment.  Other Tissue/ Organ Involvement: Not identified  Lymphovascular Invasion: Present, extensive.  Margins: All margins negative for tumor  Regional Lymph Nodes: Not applicable (no lymph nodes submitted or found)  Distant Metastasis:        Distant Site(s) Involved: Not applicable  Pathologic Stage Classification (pTNM, AJCC 8th Edition): pT1b, pN not  assigned  Ancillary Studies: Can be performed upon request  Representative Tumor Block: A6  Comment(s): The tumor has two morphologically different components. One component consists of malignant spindle cells which can be seen arising from the smooth muscle. These foci are positive for SMA, desmin, cyclinD1 (focal), and CD10 (patchy). The other component consists of round to slightly spindle cells with abundant admixed vessels and occasional large pleomorphic cells. These areas are positive for SMA (patchy weak), desmin (focal), CD10 (variable with diffuse areas). Pancytokeratin is negative. 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   ADDENDUM:   PROGNOSTIC INDICATOR RESULTS:   Immunohistochemical and morphometric analysis performed manually    Estrogen Receptor:       POSITIVE, 50%, WEAK TO MODERATE STAINING  Progesterone Receptor:   POSITIVE, 50%, MODERATE TO STRONG STAINING   Reference Range Estrogen and Progesterone Receptor       Negative  0%       Positive  >1%    06/03/2021 Surgery   Surgeon: Quinn Axe    Assistants: Dr Antionette Char (an MD assistant was necessary for tissue manipulation, management of robotic instrumentation, retraction and positioning due to the complexity of the case and hospital policies).  Operation: Robotic-assisted laparoscopic total hysterectomy >250gm with bilateral salpingoophorectomy, minilaparotomy for specimen delivery   Surgeon: Quinn Axe    Operative Findings:  : Bulky 16cm uterus with intra-uterine mass (consistent with a fibroid-like mass) , smooth normal appearing serosa, no suspicious bulky nodes. Normal ovaries bilaterally. Normal upper abdomen. Uterus too large to deliver vaginally in tact.    06/18/2021 Imaging   1. Multiple  bilateral pulmonary nodules, measuring up to 9 mm. Most of these are perifissural and subpleural in location, likely lymph nodes. Nevertheless, close follow-up recommended to exclude metastatic disease. 2. 11 mm hypoattenuating lesion towards the dome of the liver, indeterminate. MRI abdomen with and without contrast could be used to further evaluate as clinically warranted. 3. Aortic Atherosclerosis (ICD10-I70.0).   10/13/2021 Imaging   1. No acute findings within the abdomen or pelvis. No specific findings identified to suggest residual or recurrence of tumor. 2. Stable small pulmonary nodules within the left lower lobe.   03/18/2022 Imaging   1. Interval development of 2 soft tissue nodules in the anterior pelvis measuring 10 mm and 7 mm respectively. Close follow-up recommended to exclude metastatic disease. PET-CT may be warranted to further evaluate. 2. Development of 2 mm left upper lobe parenchymal nodule, nonspecific. Close attention on follow-up imaging recommended  3. Stable  appearance of index bilateral pulmonary nodules identified previously. 4. Aortic Atherosclerosis (ICD10-I70.0).   04/02/2022 PET scan   1. Signs of local tumor recurrence noted at the vaginal cuff. 2. 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 New since 10/13/2021 and progressive when compared with 03/17/2022. 3. New focal area of increased uptake within the right biceps femoris muscle which is suspicious for skeletal muscle involvement. 4. Stable appearance of small pulmonary nodules described on 03/17/2022. These are too small to characterize by PET-CT.   04/09/2022 Procedure   Successful placement of a right IJ approach Power Port with ultrasound and fluoroscopic guidance. The catheter is ready for use.   04/09/2022 Echocardiogram    1. Left ventricular ejection fraction, by estimation, is 65 to 70%. The left ventricle has normal function. The left ventricle has no  regional wall motion abnormalities. There is mild concentric left ventricular hypertrophy. Left ventricular diastolic parameters are consistent with Grade I diastolic dysfunction (impaired relaxation).  2. Right ventricular systolic function is normal. The right ventricular size is normal.  3. The mitral valve is normal in structure. No evidence of mitral valve regurgitation.  4. The aortic valve is tricuspid. Aortic valve regurgitation is not visualized. No aortic stenosis is present.  5. The inferior vena cava is normal in size with greater than 50% respiratory variability, suggesting right atrial pressure of 3 mmHg.   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 Status post hysterectomy and bilateral oophorectomy. Multiple newand enlarged peritoneal nodules throughout the low abdomen and pelvis, previously FDG avid and consistent with worsened locally recurrent and peritoneal metastatic disease. 2. Multiple small bilateral pulmonary nodules, several of which are slightly enlarged compared to prior examination, consistent with worsened pulmonary metastatic disease.   Aortic Atherosclerosis (ICD10-I70.0).     06/25/2022 - 03/04/2023 Chemotherapy   Patient is on Treatment Plan : UTERINE UNDIFFERENTIATED LEIOMYOSARCOMA Gemcitabine D1,8 + Docetaxel D8 (900/100) q21d     09/02/2022 Imaging   1. Moderate response to therapy of peritoneal metastasis within the anterior pelvis. 2. Minimal response to therapy of pulmonary metastasis. 3. No new or progressive disease. 4.  Aortic Atherosclerosis (ICD10-I70.0).     12/01/2022 Imaging   1. No new or progressive findings in the chest, abdomen, or pelvis. 2. Peritoneal metastases in the pelvis described previously measure smaller today compatible with interval response to therapy. 3. Stable tiny bilateral pulmonary nodules. Previously characterized as  metastatic lesions, these are unchanged in the interval. No new suspicious pulmonary nodule or mass. 4.  Aortic Atherosclerois (ICD10-170.0)   03/04/2023 Imaging   CT CHEST ABDOMEN PELVIS W CONTRAST  Result Date: 03/03/2023 CLINICAL DATA:  Cervical cancer * Tracking Code: BO *. Assess treatment response. Current chemotherapy. Prior total hysterectomy EXAM: CT CHEST, ABDOMEN, AND PELVIS WITH CONTRAST TECHNIQUE: Multidetector CT imaging of the chest, abdomen and pelvis was performed following the standard protocol during bolus administration of intravenous contrast. RADIATION DOSE REDUCTION: This exam was performed according to the departmental dose-optimization program which includes automated exposure control, adjustment of the mA and/or kV according to patient size and/or use of iterative reconstruction technique. CONTRAST:  OMNIPAQUE IOHEXOL 300 MG/ML  SOLN COMPARISON:  CT 12/01/2022 and older FINDINGS: CT CHEST FINDINGS Cardiovascular: Right upper chest port. Heart is nonenlarged. Trace pericardial fluid. Normal caliber thoracic aorta. Mediastinum/Nodes: No specific abnormal lymph node enlargement seen in the axillary region,  hilum or mediastinum. Mildly patulous thoracic esophagus. Thyroid gland is unremarkable. Lungs/Pleura: Tiny pleural effusions are seen, left-greater-than-right. These new from previous. There is some patchy parenchymal opacity identified in the superior segment of the lower lobes, left-greater-than-right. This could be infiltrative rather than neoplastic. Recommend short follow-up. There were some small nodules identified on the prior. For example 3 mm nodule lateral left lower lobe subpleural on series 7, image 91 is stable. Right lower lobe subpleural nodule on the prior which measured 9 x 6 mm, today is stable on image 73 of series 7. Stable 2-3 mm nodule right lower lobe on series 7, image 97. Musculoskeletal: Curvature of the spine with some degenerative changes. CT ABDOMEN  PELVIS FINDINGS Hepatobiliary: Stable dome hepatic cyst. No new space-occupying lesion in the liver. Patent portal vein. Gallbladder is distended. Pancreas: Unremarkable. No pancreatic ductal dilatation or surrounding inflammatory changes. Spleen: Spleen is slightly enlarged at 13.1 cm in AP length. Preserved enhancement. Small splenule inferiorly. Adrenals/Urinary Tract: The adrenal glands are preserved. Tiny low-attenuation cystic lesions are seen which are too small to completely characterize along the kidneys. Bosniak 2 lesions. No specific imaging follow-up. The ureters have normal course and caliber down to the bladder. Bladder wall is diffusely thickened. The bladder is contracted. There is significant stranding. This is progressive from the previous examination. Stomach/Bowel: Large bowel has a normal course and caliber with scattered stool. Overall moderate stool burden. Normal appendix extends inferior to the cecum in the right lower quadrant. The stomach and small bowel are nondilated. Vascular/Lymphatic: Normal caliber aorta and IVC with some vascular calcifications. No specific abnormal lymph node enlargement identified in the abdomen and pelvis. Reproductive: Absence of the uterus and ovaries. Other: Once again there is a pelvic soft tissue nodule anteriorly on series 2, image 104 which on the prior measured 2.1 x 1.6 cm and today 1.9 x 1.3 cm. Smaller foci more caudal on axial image 111 and 112 are stable. Musculoskeletal: Curvature and degenerative changes are seen along the spine. Degenerative changes along the pelvis. IMPRESSION: Stable pelvic peritoneal nodules. No new lymph node enlargement seen in the chest, abdomen or pelvis. New tiny pleural effusions, left-greater-than-right with some ill-defined parenchymal opacities along the lower lobes. Although there is a differential this could be infectious or inflammatory. Recommend short follow-up and correlation to specific symptoms. Previous tiny  lung nodules are stable. Continued follow-up surveillance as per the patient's neoplasm. Mild splenomegaly. Worsening bladder wall thickening and stranding. Please correlate with symptoms and treatment Electronically Signed   By: Karen Kays M.D.   On: 03/03/2023 18:23      04/05/2023 Imaging   Normal bone density scan   06/21/2023 Imaging   CT CHEST ABDOMEN PELVIS W CONTRAST  Result Date: 06/20/2023 CLINICAL DATA:  Endometrial cancer restaging, chemotherapy complete, ongoing oral chemotherapy * Tracking Code: BO * EXAM: CT CHEST, ABDOMEN, AND PELVIS WITH CONTRAST TECHNIQUE: Multidetector CT imaging of the chest, abdomen and pelvis was performed following the standard protocol during bolus administration of intravenous contrast. RADIATION DOSE REDUCTION: This exam was performed according to the departmental dose-optimization program which includes automated exposure control, adjustment of the mA and/or kV according to patient size and/or use of iterative reconstruction technique. CONTRAST:  OMNIPAQUE IOHEXOL 300 MG/ML  SOLN COMPARISON:  CT chest angiogram, 03/23/2023, CT chest abdomen pelvis, 03/02/2023 FINDINGS: CT CHEST FINDINGS Cardiovascular: No significant vascular findings. Normal heart size. No pericardial effusion. Mediastinum/Nodes: No enlarged mediastinal, hilar, or axillary lymph nodes. Thyroid gland, trachea, and  esophagus demonstrate no significant findings. Lungs/Pleura: Interval resolution of previously seen pleural effusions. Significant interval improvement of previously seen heterogeneous and ground-glass airspace opacity, with small residual opacities present in the lower lobes (series 4, image 68). Multiple small subpleural nodules are unchanged, largest in the dependent right lower lobe measuring 0.9 cm (series 4, image 75). Musculoskeletal: No chest wall abnormality. No acute osseous findings. CT ABDOMEN PELVIS FINDINGS Hepatobiliary: No solid liver abnormality is seen. Benign  simple cyst of the liver dome, requiring no further follow-up or characterization (series 2, image 45). No gallstones, gallbladder wall thickening, or biliary dilatation. Pancreas: Unremarkable. No pancreatic ductal dilatation or surrounding inflammatory changes. Spleen: Normal in size without significant abnormality. Adrenals/Urinary Tract: Adrenal glands are unremarkable. Simple, benign right renal cortical cysts, for which no further follow-up or characterization is required. Kidneys are otherwise normal, without renal calculi, solid lesion, or hydronephrosis. Bladder is unremarkable. Stomach/Bowel: Stomach is within normal limits. Appendix appears normal. No evidence of bowel wall thickening, distention, or inflammatory changes. Moderate burden of stool throughout the colon and rectum. Vascular/Lymphatic: No significant vascular findings are present. No enlarged abdominal or pelvic lymph nodes. Reproductive: Status post hysterectomy. Other: No abdominal wall hernia or abnormality. No ascites. Largest soft tissue nodule in the low midline abdomen is increased in size, measuring 2.8 x 2.5 cm, previously 2.4 x 2.1 cm (series 2, image 105). Multiple additional soft tissue nodules throughout the pelvis are unchanged, largest in the anterior right pelvis measuring 1.6 x 1.2 cm (series 2, image 112). Additional nodule at the right aspect of the vaginal cuff measures 2.0 x 1.0 cm (series 2, image 112). Musculoskeletal: No acute osseous findings. IMPRESSION: 1. Largest peritoneal soft tissue nodule in the low midline abdomen is increased in size, consistent with worsened peritoneal metastatic disease. Multiple additional soft tissue nodules throughout the pelvis are unchanged. 2. 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 3. Interval resolution of previously seen pleural effusions. 4. Significant interval improvement of previously seen heterogeneous and ground-glass airspace opacity, with  small residual opacities present in the lower lobes. Findings are consistent with improved nonspecific infection or inflammation. 5. Status post hysterectomy. Electronically Signed   By: Jearld Lesch M.D.   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 * Tracking Code: BO * EXAM: CT CHEST, ABDOMEN, AND PELVIS WITH CONTRAST TECHNIQUE: Multidetector CT imaging of the chest, abdomen and pelvis was performed following the standard protocol during bolus administration of intravenous contrast. RADIATION DOSE REDUCTION: This exam was performed according to the departmental dose-optimization program which includes automated exposure control, adjustment of the mA and/or kV according to patient size and/or use of iterative reconstruction technique. CONTRAST:  OMNIPAQUE IOHEXOL 300 MG/ML  SOLN COMPARISON:  Multiple priors including most recent CT June 18, 2023 FINDINGS: CT CHEST FINDINGS Cardiovascular: Aortic atherosclerosis. Accessed right chest Port-A-Cath with tip at the superior cavoatrial junction. No central pulmonary embolus on this nondedicated study. Normal size heart. No significant pericardial effusion/thickening. Mediastinum/Nodes: No suspicious thyroid nodule. No pathologically enlarged mediastinal, hilar or axillary lymph nodes. Small hiatal hernia. Lungs/Pleura: Bilateral solid pulmonary nodules, some of which are stable in size others are increased in size. For reference: -left upper lobe pulmonary nodule measuring 6 mm on image 77/2 previously measured 3 mm. -subpleural right lower lobe pulmonary nodule measures 9 mm on image 77/7, unchanged. A few new  scattered tiny pulmonary nodules for instance a 3 mm pulmonary nodule in the right lower lobe on image 87/7. Musculoskeletal: No aggressive lytic or blastic lesion of bone. Multilevel degenerative changes spine. CT ABDOMEN  PELVIS FINDINGS Hepatobiliary: Stable 9 mm hypodense lesion in the dome of the liver previously characterized as a cyst on MRI July 04, 2021. No suspicious hepatic lesion. Gallbladder is unremarkable. No biliary ductal dilation. Pancreas: No pancreatic ductal dilation or evidence of acute inflammation. Spleen: No splenomegaly. Adrenals/Urinary Tract: Bilateral adrenal glands appear normal. No hydronephrosis. Stomach/Bowel: No radiopaque enteric contrast material was administered. Stomach is unremarkable for degree of distension. Normal appendix. No pathologic dilation of small or large bowel. Vascular/Lymphatic: Aortic atherosclerosis. Normal caliber abdominal aorta. Smooth IVC contours. The portal, splenic and superior mesenteric veins are patent. No pathologically enlarged abdominal or pelvic lymph nodes. Reproductive: Prior hysterectomy with increased size of the vaginal cuff nodules previously indexed nodule in the right vaginal cuff measures 3.2 cm on image 116/2 previously 2.0 cm. Other: Increased size and number of peritoneal/omental soft tissue implants. For reference: -previously indexed soft tissue nodule in the anterior pelvis now measures 5.7 x 3.6 cm on image 105/2 previously 2.8 x 2.5 cm. -new pelvic soft tissue nodule measures 2.3 cm on image 106/2. Trace free fluid in the pelvis with a new walled-off pelvic fluid collection measuring 3.9 cm adjacent to a soft tissue implant on image 105/2. Musculoskeletal: No aggressive lytic or blastic lesion of bone. IMPRESSION: 1. Worsening peritoneal carcinomatosis, increased size of the vaginal cuff nodules, and increased size/of bilateral solid pulmonary nodules, consistent with worsening metastatic disease. 2. Aortic Atherosclerosis (ICD10-I70.0). Electronically Signed   By: Maudry Mayhew M.D.   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,8 + Docetaxel D8  (900/100) q21d       PHYSICAL EXAMINATION: ECOG PERFORMANCE STATUS: 1 - Symptomatic but completely ambulatory  Vitals:   12/16/23 0820  BP: 132/73  Pulse: 88  Resp: 18  Temp: 99 F (37.2 C)  SpO2: 98%   Filed Weights   12/16/23 0820  Weight: 197 lb (89.4 kg)    GENERAL:alert, no distress and comfortable  LABORATORY DATA:  I have reviewed the data as listed    Component Value Date/Time   NA 137 12/16/2023 0741   K 4.3 12/16/2023 0741   CL 106 12/16/2023 0741   CO2 27 12/16/2023 0741   GLUCOSE 116 (H) 12/16/2023 0741   BUN 11 12/16/2023 0741   CREATININE 0.61 12/16/2023 0741   CALCIUM 8.8 (L) 12/16/2023 0741   PROT 6.0 (L) 12/16/2023 0741   ALBUMIN 3.3 (L) 12/16/2023 0741   AST 18 12/16/2023 0741   ALT 16 12/16/2023 0741   ALKPHOS 97 12/16/2023 0741   BILITOT 0.3 12/16/2023 0741   GFRNONAA >60 12/16/2023 0741    No results found for: "SPEP", "UPEP"  Lab Results  Component Value Date   WBC 7.0 12/16/2023   NEUTROABS 3.3 12/16/2023   HGB 11.8 (L) 12/16/2023   HCT 34.6 (L) 12/16/2023   MCV 94.0 12/16/2023   PLT 259 12/16/2023      Chemistry      Component Value Date/Time   NA 137 12/16/2023 0741   K 4.3 12/16/2023 0741   CL 106 12/16/2023 0741   CO2 27 12/16/2023 0741   BUN 11 12/16/2023 0741   CREATININE 0.61 12/16/2023 0741      Component  Value Date/Time   CALCIUM 8.8 (L) 12/16/2023 0741   ALKPHOS 97 12/16/2023 0741   AST 18 12/16/2023 0741   ALT 16 12/16/2023 0741   BILITOT 0.3 12/16/2023 0741

## 2023-12-16 NOTE — Patient Instructions (Signed)
[facility] - A DEPT OF [facility]  Discharge Instructions: Thank you for choosing [facility] to provide your oncology and hematology care.   If you have a lab appointment with the Cancer Center, please go directly to the Cancer Center and check in at the registration area.   Wear comfortable clothing and clothing appropriate for easy access to any Portacath or PICC line.   We strive to give you quality time with your provider. You may need to reschedule your appointment if you arrive late (15 or more minutes).  Arriving late affects you and other patients whose appointments are after yours.  Also, if you miss three or more appointments without notifying the office, you may be dismissed from the clinic at the provider's discretion.      For prescription refill requests, have your pharmacy contact our office and allow 72 hours for refills to be completed.    Today you received the following chemotherapy and/or immunotherapy agents gemcitabine      To help prevent nausea and vomiting after your treatment, we encourage you to take your nausea medication as directed.  BELOW ARE SYMPTOMS THAT SHOULD BE REPORTED IMMEDIATELY: *FEVER GREATER THAN 100.4 F (38 C) OR HIGHER *CHILLS OR SWEATING *NAUSEA AND VOMITING THAT IS NOT CONTROLLED WITH YOUR NAUSEA MEDICATION *UNUSUAL SHORTNESS OF BREATH *UNUSUAL BRUISING OR BLEEDING *URINARY PROBLEMS (pain or burning when urinating, or frequent urination) *BOWEL PROBLEMS (unusual diarrhea, constipation, pain near the anus) TENDERNESS IN MOUTH AND THROAT WITH OR WITHOUT PRESENCE OF ULCERS (sore throat, sores in mouth, or a toothache) UNUSUAL RASH, SWELLING OR PAIN  UNUSUAL VAGINAL DISCHARGE OR ITCHING   Items with * indicate a potential emergency and should be followed up as soon as possible or go to the Emergency Department if any problems should occur.  Please show the CHEMOTHERAPY ALERT CARD or IMMUNOTHERAPY  ALERT CARD at check-in to the Emergency Department and triage nurse.  Should you have questions after your visit or need to cancel or reschedule your appointment, please contact [facility] - A DEPT OF [facility]  Dept: [phone]  and follow the prompts.  Office hours are 8:00 a.m. to 4:30 p.m. Monday - Friday. Please note that voicemails left after 4:00 p.m. may not be returned until the following business day.  We are closed weekends and major holidays. You have access to a nurse at all times for urgent questions. Please call the main number to the clinic Dept: [phone] and follow the prompts.   For any non-urgent questions, you may also contact your provider using MyChart. We now offer e-Visits for anyone 49 and older to request care online for non-urgent symptoms. For details visit [email or web address].   Also download the MyChart app! Go to the app store, search "MyChart", open the app, select [place], and log in with your MyChart username and password.

## 2023-12-17 ENCOUNTER — Ambulatory Visit: Payer: Managed Care, Other (non HMO) | Admitting: Hematology and Oncology

## 2023-12-17 ENCOUNTER — Ambulatory Visit: Payer: Managed Care, Other (non HMO)

## 2023-12-17 ENCOUNTER — Other Ambulatory Visit: Payer: Managed Care, Other (non HMO)

## 2023-12-27 ENCOUNTER — Telehealth: Payer: Self-pay

## 2023-12-27 ENCOUNTER — Other Ambulatory Visit: Payer: Self-pay

## 2023-12-27 MED ORDER — PEGFILGRASTIM INJECTION 6 MG/0.6ML ~~LOC~~: 6.0000 mg | PREFILLED_SYRINGE | SUBCUTANEOUS | 9 refills | Status: DC

## 2023-12-27 NOTE — Telephone Encounter (Signed)
 Returned call to [facility] case Production designer, theatre/television/film and sent Neulasta  Rx as requested to [facility].

## 2023-12-29 ENCOUNTER — Encounter: Payer: Self-pay | Admitting: Hematology and Oncology

## 2023-12-30 ENCOUNTER — Inpatient Hospital Stay (HOSPITAL_BASED_OUTPATIENT_CLINIC_OR_DEPARTMENT_OTHER): Payer: Managed Care, Other (non HMO) | Admitting: Hematology and Oncology

## 2023-12-30 ENCOUNTER — Other Ambulatory Visit: Payer: Managed Care, Other (non HMO)

## 2023-12-30 ENCOUNTER — Ambulatory Visit: Payer: Managed Care, Other (non HMO)

## 2023-12-30 ENCOUNTER — Inpatient Hospital Stay: Payer: PRIVATE HEALTH INSURANCE

## 2023-12-30 ENCOUNTER — Inpatient Hospital Stay: Payer: Managed Care, Other (non HMO) | Attending: Gynecologic Oncology

## 2023-12-30 ENCOUNTER — Encounter: Payer: Self-pay | Admitting: Hematology and Oncology

## 2023-12-30 ENCOUNTER — Ambulatory Visit: Payer: Managed Care, Other (non HMO) | Admitting: Hematology and Oncology

## 2023-12-30 VITALS — BP 121/70 | HR 79 | Temp 98.0°F | Resp 18 | Ht 67.0 in | Wt 198.6 lb

## 2023-12-30 VITALS — BP 136/83 | HR 96 | Temp 98.2°F | Resp 18

## 2023-12-30 DIAGNOSIS — D6481 Anemia due to antineoplastic chemotherapy: Secondary | ICD-10-CM | POA: Insufficient documentation

## 2023-12-30 DIAGNOSIS — Z90722 Acquired absence of ovaries, bilateral: Secondary | ICD-10-CM | POA: Insufficient documentation

## 2023-12-30 DIAGNOSIS — C786 Secondary malignant neoplasm of retroperitoneum and peritoneum: Secondary | ICD-10-CM | POA: Insufficient documentation

## 2023-12-30 DIAGNOSIS — Z5111 Encounter for antineoplastic chemotherapy: Secondary | ICD-10-CM | POA: Insufficient documentation

## 2023-12-30 DIAGNOSIS — Z170 Estrogen receptor positive status [ER+]: Secondary | ICD-10-CM | POA: Diagnosis not present

## 2023-12-30 DIAGNOSIS — C541 Malignant neoplasm of endometrium: Secondary | ICD-10-CM | POA: Insufficient documentation

## 2023-12-30 DIAGNOSIS — C549 Malignant neoplasm of corpus uteri, unspecified: Secondary | ICD-10-CM

## 2023-12-30 DIAGNOSIS — C7802 Secondary malignant neoplasm of left lung: Secondary | ICD-10-CM | POA: Insufficient documentation

## 2023-12-30 DIAGNOSIS — T451X5A Adverse effect of antineoplastic and immunosuppressive drugs, initial encounter: Secondary | ICD-10-CM

## 2023-12-30 DIAGNOSIS — Z90710 Acquired absence of both cervix and uterus: Secondary | ICD-10-CM | POA: Diagnosis not present

## 2023-12-30 DIAGNOSIS — Z1721 Progesterone receptor positive status: Secondary | ICD-10-CM | POA: Insufficient documentation

## 2023-12-30 DIAGNOSIS — C7801 Secondary malignant neoplasm of right lung: Secondary | ICD-10-CM | POA: Diagnosis not present

## 2023-12-30 LAB — CBC WITH DIFFERENTIAL (CANCER CENTER ONLY)
Abs Immature Granulocytes: 0.05 10*3/uL (ref 0.00–0.07)
Basophils Absolute: 0.1 10*3/uL (ref 0.0–0.1)
Basophils Relative: 1 %
Eosinophils Absolute: 0.4 10*3/uL (ref 0.0–0.5)
Eosinophils Relative: 5 %
HCT: 35 % — ABNORMAL LOW (ref 36.0–46.0)
Hemoglobin: 11.9 g/dL — ABNORMAL LOW (ref 12.0–15.0)
Immature Granulocytes: 1 %
Lymphocytes Relative: 30 %
Lymphs Abs: 2.7 10*3/uL (ref 0.7–4.0)
MCH: 32.9 pg (ref 26.0–34.0)
MCHC: 34 g/dL (ref 30.0–36.0)
MCV: 96.7 fL (ref 80.0–100.0)
Monocytes Absolute: 1.4 10*3/uL — ABNORMAL HIGH (ref 0.1–1.0)
Monocytes Relative: 16 %
Neutro Abs: 4.3 10*3/uL (ref 1.7–7.7)
Neutrophils Relative %: 47 %
Platelet Count: 321 10*3/uL (ref 150–400)
RBC: 3.62 MIL/uL — ABNORMAL LOW (ref 3.87–5.11)
RDW: 17.7 % — ABNORMAL HIGH (ref 11.5–15.5)
WBC Count: 9 10*3/uL (ref 4.0–10.5)
nRBC: 0 % (ref 0.0–0.2)

## 2023-12-30 LAB — CMP (CANCER CENTER ONLY)
ALT: 18 U/L (ref 0–44)
AST: 19 U/L (ref 15–41)
Albumin: 3.5 g/dL (ref 3.5–5.0)
Alkaline Phosphatase: 101 U/L (ref 38–126)
Anion gap: 6 (ref 5–15)
BUN: 16 mg/dL (ref 8–23)
CO2: 28 mmol/L (ref 22–32)
Calcium: 9.6 mg/dL (ref 8.9–10.3)
Chloride: 103 mmol/L (ref 98–111)
Creatinine: 0.62 mg/dL (ref 0.44–1.00)
GFR, Estimated: >60 mL/min (ref >60)
Glucose, Bld: 100 mg/dL — ABNORMAL HIGH (ref 70–99)
Potassium: 3.9 mmol/L (ref 3.5–5.1)
Sodium: 137 mmol/L (ref 135–145)
Total Bilirubin: 0.3 mg/dL (ref 0.0–1.2)
Total Protein: 6.5 g/dL (ref 6.5–8.1)

## 2023-12-30 MED ORDER — SODIUM CHLORIDE 0.9% FLUSH
10.0000 mL | Freq: Once | INTRAVENOUS | Status: AC
  Administered 2023-12-30: 10 mL

## 2023-12-30 MED ORDER — HEPARIN SOD (PORK) LOCK FLUSH 100 UNIT/ML IV SOLN
500.0000 [IU] | Freq: Once | INTRAVENOUS | Status: AC | PRN
  Administered 2023-12-30: 500 [IU]

## 2023-12-30 MED ORDER — SODIUM CHLORIDE 0.9 % IV SOLN
60.0000 mg/m2 | Freq: Once | INTRAVENOUS | Status: AC
  Administered 2023-12-30: 121 mg via INTRAVENOUS
  Filled 2023-12-30: qty 12.1

## 2023-12-30 MED ORDER — DEXAMETHASONE SODIUM PHOSPHATE 10 MG/ML IJ SOLN
10.0000 mg | Freq: Once | INTRAMUSCULAR | Status: AC
  Administered 2023-12-30: 10 mg via INTRAVENOUS
  Filled 2023-12-30: qty 1

## 2023-12-30 MED ORDER — SODIUM CHLORIDE 0.9 % IV SOLN
600.0000 mg/m2 | Freq: Once | INTRAVENOUS | Status: AC
  Administered 2023-12-30: 1216 mg via INTRAVENOUS
  Filled 2023-12-30: qty 31.98

## 2023-12-30 MED ORDER — SODIUM CHLORIDE 0.9 % IV SOLN
INTRAVENOUS | Status: DC
Start: 2023-12-30 — End: 2023-12-30

## 2023-12-30 MED ORDER — PROCHLORPERAZINE MALEATE 10 MG PO TABS
10.0000 mg | ORAL_TABLET | Freq: Once | ORAL | Status: AC
  Administered 2023-12-30: 10 mg via ORAL
  Filled 2023-12-30: qty 1

## 2023-12-30 MED ORDER — SODIUM CHLORIDE 0.9% FLUSH
10.0000 mL | INTRAVENOUS | Status: DC | PRN
  Administered 2023-12-30: 10 mL

## 2023-12-30 NOTE — Progress Notes (Signed)
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 The patient denies recent history of bleeding such as epistaxis, hematuria or hematochezia. She is asymptomatic from the anemia. I will observe for now.  She does not require transfusion now. I will continue the chemotherapy at current dose without dosage adjustment.  If the anemia gets progressive worse in the future, I might have to delay her treatment or adjust the chemotherapy dose.   No orders of the defined types were placed in this encounter.   All questions were answered. The patient knows to call the clinic with any problems, questions or concerns. The total time spent in the appointment was 20 minutes encounter with patients including review of chart and various tests results, discussions about plan of care and coordination of care plan   Artis Delay, MD 12/30/2023 10:31 AM  INTERVAL HISTORY: Please see below for problem oriented charting. she returns for chemo follow-up She tolerated last cycle therapy well Apart from some mild fatigue, she denies peripheral neuropathy We discussed timing of imaging study and future follow-up  REVIEW OF SYSTEMS:   Constitutional: Denies fevers, chills or abnormal weight loss Eyes: Denies blurriness of vision Ears, nose, mouth, throat, and face: Denies mucositis or sore throat Respiratory: Denies cough, dyspnea or wheezes Cardiovascular: Denies  palpitation, chest discomfort or lower extremity swelling Gastrointestinal:  Denies nausea, heartburn or change in bowel habits Skin: Denies abnormal skin rashes Lymphatics: Denies new lymphadenopathy or easy bruising Neurological:Denies numbness, tingling or new weaknesses Behavioral/Psych: Mood is stable, no new changes  All other systems were reviewed with the patient and are negative.  I have reviewed the past medical history, past surgical history, social history and family history with the patient and they are unchanged from previous note.  ALLERGIES:  has no known allergies.  MEDICATIONS:  Current Outpatient Medications  Medication Sig Dispense Refill   acetaminophen (TYLENOL) 500 MG tablet Take 500 mg by mouth every 6 (six) hours as needed for mild pain, moderate pain or fever.     aspirin 81 MG EC tablet Take by mouth daily.     Cholecalciferol (VITAMIN D3 PO) Take 1 tablet by mouth daily.     dexamethasone (DECADRON) 4 MG tablet Take 2 tabs by mouth 2 times daily starting day before chemo with Taxotere. Then take 2 tabs daily for 2 days starting day after chemo. Take with food. 30 tablet 1   furosemide (LASIX) 20 MG tablet Take 1 tablet (20 mg total) by mouth as needed for fluid or edema. Only takes 2-3 days per week 30 tablet 3   lidocaine-prilocaine (EMLA) cream Apply to affected area once 30 g 3   loratadine (CLARITIN) 10 MG tablet Take 10 mg by mouth daily.     LORazepam (ATIVAN) 0.5 MG tablet Take 1 tablet (0.5 mg total) by mouth 2 (two) times daily as needed for anxiety. 10 tablet 0   losartan (COZAAR) 50 MG tablet Take 1 tablet (50 mg total) by mouth  daily. 90 tablet 1   magic mouthwash (lidocaine, diphenhydrAMINE, alum & mag hydroxide) suspension Swish and spit 5 mLs by mouth 4 (four) times daily for 7 days. 160 mL 0   metoprolol tartrate (LOPRESSOR) 25 MG tablet Take 1 tablet (25 mg total) by mouth 2 (two) times daily. 180 tablet 1   Multiple Vitamin (MULTIVITAMIN WITH  MINERALS) TABS tablet Take 1 tablet by mouth daily. 30 tablet 1   omeprazole (PRILOSEC) 20 MG capsule Take 1 capsule (20 mg total) by mouth daily for 5000 doses.     ondansetron (ZOFRAN) 8 MG tablet Take 1 tablet (8 mg total) by mouth every 8 (eight) hours as needed for nausea or vomiting. 30 tablet 1   pegfilgrastim (NEULASTA) 6 MG/0.6ML injection Inject 0.6 mLs (6 mg total) into the skin every 28 (twenty-eight) days for 10 doses. 0.6 mL 9   prochlorperazine (COMPAZINE) 10 MG tablet Take 1 tablet (10 mg total) by mouth every 6 (six) hours as needed for nausea or vomiting. 60 tablet 1   No current facility-administered medications for this visit.   Facility-Administered Medications Ordered in Other Visits  Medication Dose Route Frequency Provider Last Rate Last Admin   0.9 %  sodium chloride infusion   Intravenous Continuous Bertis Ruddy, Kasidy Gianino, MD 10 mL/hr at 12/30/23 0908 New Bag at 12/30/23 0908   DOCEtaxel (TAXOTERE) 121 mg in sodium chloride 0.9 % 250 mL chemo infusion  60 mg/m2 (Treatment Plan Recorded) Intravenous Once Artis Delay, MD       gemcitabine (GEMZAR) 1,216 mg in sodium chloride 0.9 % 250 mL chemo infusion  600 mg/m2 (Treatment Plan Recorded) Intravenous Once Artis Delay, MD 282 mL/hr at 12/30/23 1003 1,216 mg at 12/30/23 1003   heparin lock flush 100 unit/mL  500 Units Intracatheter Once PRN Bertis Ruddy, Lilla Callejo, MD       sodium chloride flush (NS) 0.9 % injection 10 mL  10 mL Intracatheter PRN Artis Delay, MD        SUMMARY OF ONCOLOGIC HISTORY: Oncology History Overview Note  High grade and LMS, 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 No extra uterine extension. No  evidence of metastatic disease within the pelvis.   Mild thickening of the endometrial stripe, likely related to entrapment by the adjacent uterine mass   06/03/2021 Initial Diagnosis   Uterine sarcoma (HCC)   06/03/2021 Cancer Staging   Staging form: Corpus Uteri - Sarcoma, AJCC 7th Edition - Clinical stage from 06/03/2021: FIGO Stage IVB (rT1b, N0, M1) - Signed by Artis Delay, MD on 03/31/2022 Diagnostic confirmation: Positive histology Stage prefix: Recurrence Biopsy of metastatic site performed: No Lymph-vascular invasion (LVI): LVI present/identified, NOS   06/03/2021 Pathology Results   FINAL MICROSCOPIC DIAGNOSIS:   A. UTERUS, CERVIX, BILATERAL FALLOPIAN TUBES AND OVARIES, HYSTERECTOMY:  - Uterus:       Mixed high grade uterine sarcoma, spanning 6 cm, see comment.       Extensive lymphovascular invasion.       See oncology table.  - Cervix: Benign squamous and endocervical mucosa. No dysplasia or  malignancy.  - Bilateral ovaries: Unremarkable. No malignancy.  - Bilateral fallopian tubes: Unremarkable. No malignancy.   ONCOLOGY TABLE:   UTERUS, SARCOMA: Resection   Procedure: Total hysterectomy and bilateral salpingo-oophorectomy  Specimen Integrity: Focally disrupted on posterior surface  Tumor Site: Anterior wall  Tumor Size: 6 cm  Histologic Type: High grade sarcoma, mixed. See comment.  Other Tissue/ Organ Involvement: Not identified  Lymphovascular Invasion: Present, extensive.  Margins: All margins negative for tumor  Regional Lymph Nodes: Not applicable (no lymph nodes submitted or found)  Distant Metastasis:       Distant Site(s) Involved: Not applicable  Pathologic Stage Classification (pTNM, AJCC 8th Edition): pT1b, pN not  assigned  Ancillary Studies: Can be performed upon request  Representative Tumor Block: A6  Comment(s): The tumor has two morphologically different components. One component consists of malignant spindle cells which can be seen arising  from the smooth muscle. These foci are positive for SMA, desmin, cyclinD1 (focal), and CD10 (patchy). The other component consists of round to slightly spindle cells with abundant admixed vessels and occasional large pleomorphic cells. These areas are positive for SMA (patchy weak), desmin (focal), CD10 (variable with diffuse areas). Pancytokeratin is negative. 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   ADDENDUM:   PROGNOSTIC INDICATOR RESULTS:   Immunohistochemical and morphometric analysis performed manually    Estrogen Receptor:       POSITIVE, 50%, WEAK TO MODERATE STAINING  Progesterone Receptor:   POSITIVE, 50%, MODERATE TO STRONG STAINING   Reference Range Estrogen and Progesterone Receptor       Negative  0%       Positive  >1%    06/03/2021 Surgery   Surgeon: Quinn Axe    Assistants: Dr Antionette Char (an MD assistant was necessary for tissue manipulation, management of robotic instrumentation, retraction and positioning due to the complexity of the case and hospital policies).  Operation: Robotic-assisted laparoscopic total hysterectomy >250gm with bilateral salpingoophorectomy, minilaparotomy for specimen delivery   Surgeon: Quinn Axe    Operative Findings:  : Bulky 16cm uterus with intra-uterine mass (consistent with a fibroid-like mass) , smooth normal appearing serosa, no suspicious bulky nodes. Normal ovaries bilaterally. Normal upper abdomen. Uterus too large to deliver vaginally in tact.    06/18/2021 Imaging   1. Multiple bilateral pulmonary nodules, measuring up to 9 mm. Most of these are perifissural and subpleural in location, likely lymph nodes. Nevertheless, close follow-up recommended to exclude metastatic disease. 2. 11 mm hypoattenuating lesion towards the dome of the liver, indeterminate. MRI abdomen with and without contrast could be used to further evaluate as  clinically warranted. 3. Aortic Atherosclerosis (ICD10-I70.0).   10/13/2021 Imaging   1. No acute findings within the abdomen or pelvis. No specific findings identified to suggest residual or recurrence of tumor. 2. Stable small pulmonary nodules within the left lower lobe.   03/18/2022 Imaging   1. Interval development of 2 soft tissue nodules in the anterior pelvis measuring 10 mm and 7 mm respectively. Close follow-up recommended to exclude metastatic disease. PET-CT may be warranted to further evaluate. 2. Development of 2 mm left upper lobe parenchymal nodule, nonspecific. Close attention on follow-up imaging recommended  3. Stable appearance of index bilateral pulmonary nodules identified previously. 4. Aortic Atherosclerosis (ICD10-I70.0).   04/02/2022 PET scan   1. Signs of local tumor recurrence noted at the vaginal cuff. 2. 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 New since 10/13/2021 and progressive when compared with 03/17/2022. 3. New focal area of increased uptake within the right biceps femoris muscle which is suspicious for skeletal muscle involvement. 4. Stable appearance of small  pulmonary nodules described on 03/17/2022. These are too small to characterize by PET-CT.   04/09/2022 Procedure   Successful placement of a right IJ approach Power Port with ultrasound and fluoroscopic guidance. The catheter is ready for use.   04/09/2022 Echocardiogram    1. Left ventricular ejection fraction, by estimation, is 65 to 70%. The left ventricle has normal function. The left ventricle has no regional wall motion abnormalities. There is mild concentric left ventricular hypertrophy. Left ventricular diastolic parameters are consistent with Grade I diastolic dysfunction (impaired relaxation).  2. Right ventricular systolic function is normal. The right ventricular size is normal.  3. The mitral valve is normal in structure. No evidence of mitral  valve regurgitation.  4. The aortic valve is tricuspid. Aortic valve regurgitation is not visualized. No aortic stenosis is present.  5. The inferior vena cava is normal in size with greater than 50% respiratory variability, suggesting right atrial pressure of 3 mmHg.   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 Status post hysterectomy and bilateral oophorectomy. Multiple newand enlarged peritoneal nodules throughout the low abdomen and pelvis, previously FDG avid and consistent with worsened locally recurrent and peritoneal metastatic disease. 2. Multiple small bilateral pulmonary nodules, several of which are slightly enlarged compared to prior examination, consistent with worsened pulmonary metastatic disease.   Aortic Atherosclerosis (ICD10-I70.0).     06/25/2022 - 03/04/2023 Chemotherapy   Patient is on Treatment Plan : UTERINE UNDIFFERENTIATED LEIOMYOSARCOMA Gemcitabine D1,8 + Docetaxel D8 (900/100) q21d     09/02/2022 Imaging   1. Moderate response to therapy of peritoneal metastasis within the anterior pelvis. 2. Minimal response to therapy of pulmonary metastasis. 3. No new or progressive disease. 4.  Aortic Atherosclerosis (ICD10-I70.0).     12/01/2022 Imaging   1. No new or progressive findings in the chest, abdomen, or pelvis. 2. Peritoneal metastases in the pelvis described previously measure smaller today compatible with interval response to therapy. 3. Stable tiny bilateral pulmonary nodules. Previously characterized as metastatic lesions, these are unchanged in the interval. No new suspicious pulmonary nodule or mass. 4.  Aortic Atherosclerois (ICD10-170.0)   03/04/2023 Imaging   CT CHEST ABDOMEN PELVIS W CONTRAST  Result Date: 03/03/2023 CLINICAL DATA:  Cervical cancer * Tracking Code: BO *. Assess treatment response. Current chemotherapy. Prior total hysterectomy EXAM:  CT CHEST, ABDOMEN, AND PELVIS WITH CONTRAST TECHNIQUE: Multidetector CT imaging of the chest, abdomen and pelvis was performed following the standard protocol during bolus administration of intravenous contrast. RADIATION DOSE REDUCTION: This exam was performed according to the departmental dose-optimization program which includes automated exposure control, adjustment of the mA and/or kV according to patient size and/or use of iterative reconstruction technique. CONTRAST:  OMNIPAQUE IOHEXOL 300 MG/ML  SOLN COMPARISON:  CT 12/01/2022 and older FINDINGS: CT CHEST FINDINGS Cardiovascular: Right upper chest port. Heart is nonenlarged. Trace pericardial fluid. Normal caliber thoracic aorta. Mediastinum/Nodes: No specific abnormal lymph node enlargement seen in the axillary region, hilum or mediastinum. Mildly patulous thoracic esophagus. Thyroid gland is unremarkable. Lungs/Pleura: Tiny pleural effusions are seen, left-greater-than-right. These new from previous. There is some patchy parenchymal opacity identified in the superior segment of the lower lobes, left-greater-than-right. This could be infiltrative rather than neoplastic. Recommend short follow-up. There were some small nodules identified on the prior. For example 3 mm nodule lateral left lower lobe subpleural on series 7, image 91 is stable.  Right lower lobe subpleural nodule on the prior which measured 9 x 6 mm, today is stable on image 73 of series 7. Stable 2-3 mm nodule right lower lobe on series 7, image 97. Musculoskeletal: Curvature of the spine with some degenerative changes. CT ABDOMEN PELVIS FINDINGS Hepatobiliary: Stable dome hepatic cyst. No new space-occupying lesion in the liver. Patent portal vein. Gallbladder is distended. Pancreas: Unremarkable. No pancreatic ductal dilatation or surrounding inflammatory changes. Spleen: Spleen is slightly enlarged at 13.1 cm in AP length. Preserved enhancement. Small splenule inferiorly.  Adrenals/Urinary Tract: The adrenal glands are preserved. Tiny low-attenuation cystic lesions are seen which are too small to completely characterize along the kidneys. Bosniak 2 lesions. No specific imaging follow-up. The ureters have normal course and caliber down to the bladder. Bladder wall is diffusely thickened. The bladder is contracted. There is significant stranding. This is progressive from the previous examination. Stomach/Bowel: Large bowel has a normal course and caliber with scattered stool. Overall moderate stool burden. Normal appendix extends inferior to the cecum in the right lower quadrant. The stomach and small bowel are nondilated. Vascular/Lymphatic: Normal caliber aorta and IVC with some vascular calcifications. No specific abnormal lymph node enlargement identified in the abdomen and pelvis. Reproductive: Absence of the uterus and ovaries. Other: Once again there is a pelvic soft tissue nodule anteriorly on series 2, image 104 which on the prior measured 2.1 x 1.6 cm and today 1.9 x 1.3 cm. Smaller foci more caudal on axial image 111 and 112 are stable. Musculoskeletal: Curvature and degenerative changes are seen along the spine. Degenerative changes along the pelvis. IMPRESSION: Stable pelvic peritoneal nodules. No new lymph node enlargement seen in the chest, abdomen or pelvis. New tiny pleural effusions, left-greater-than-right with some ill-defined parenchymal opacities along the lower lobes. Although there is a differential this could be infectious or inflammatory. Recommend short follow-up and correlation to specific symptoms. Previous tiny lung nodules are stable. Continued follow-up surveillance as per the patient's neoplasm. Mild splenomegaly. Worsening bladder wall thickening and stranding. Please correlate with symptoms and treatment Electronically Signed   By: Karen Kays M.D.   On: 03/03/2023 18:23      04/05/2023 Imaging   Normal bone density scan   06/21/2023 Imaging   CT  CHEST ABDOMEN PELVIS W CONTRAST  Result Date: 06/20/2023 CLINICAL DATA:  Endometrial cancer restaging, chemotherapy complete, ongoing oral chemotherapy * Tracking Code: BO * EXAM: CT CHEST, ABDOMEN, AND PELVIS WITH CONTRAST TECHNIQUE: Multidetector CT imaging of the chest, abdomen and pelvis was performed following the standard protocol during bolus administration of intravenous contrast. RADIATION DOSE REDUCTION: This exam was performed according to the departmental dose-optimization program which includes automated exposure control, adjustment of the mA and/or kV according to patient size and/or use of iterative reconstruction technique. CONTRAST:  OMNIPAQUE IOHEXOL 300 MG/ML  SOLN COMPARISON:  CT chest angiogram, 03/23/2023, CT chest abdomen pelvis, 03/02/2023 FINDINGS: CT CHEST FINDINGS Cardiovascular: No significant vascular findings. Normal heart size. No pericardial effusion. Mediastinum/Nodes: No enlarged mediastinal, hilar, or axillary lymph nodes. Thyroid gland, trachea, and esophagus demonstrate no significant findings. Lungs/Pleura: Interval resolution of previously seen pleural effusions. Significant interval improvement of previously seen heterogeneous and ground-glass airspace opacity, with small residual opacities present in the lower lobes (series 4, image 68). Multiple small subpleural nodules are unchanged, largest in the dependent right lower lobe measuring 0.9 cm (series 4, image 75). Musculoskeletal: No chest wall abnormality. No acute osseous findings. CT ABDOMEN PELVIS FINDINGS Hepatobiliary: No solid liver  abnormality is seen. Benign simple cyst of the liver dome, requiring no further follow-up or characterization (series 2, image 45). No gallstones, gallbladder wall thickening, or biliary dilatation. Pancreas: Unremarkable. No pancreatic ductal dilatation or surrounding inflammatory changes. Spleen: Normal in size without significant abnormality. Adrenals/Urinary Tract: Adrenal  glands are unremarkable. Simple, benign right renal cortical cysts, for which no further follow-up or characterization is required. Kidneys are otherwise normal, without renal calculi, solid lesion, or hydronephrosis. Bladder is unremarkable. Stomach/Bowel: Stomach is within normal limits. Appendix appears normal. No evidence of bowel wall thickening, distention, or inflammatory changes. Moderate burden of stool throughout the colon and rectum. Vascular/Lymphatic: No significant vascular findings are present. No enlarged abdominal or pelvic lymph nodes. Reproductive: Status post hysterectomy. Other: No abdominal wall hernia or abnormality. No ascites. Largest soft tissue nodule in the low midline abdomen is increased in size, measuring 2.8 x 2.5 cm, previously 2.4 x 2.1 cm (series 2, image 105). Multiple additional soft tissue nodules throughout the pelvis are unchanged, largest in the anterior right pelvis measuring 1.6 x 1.2 cm (series 2, image 112). Additional nodule at the right aspect of the vaginal cuff measures 2.0 x 1.0 cm (series 2, image 112). Musculoskeletal: No acute osseous findings. IMPRESSION: 1. Largest peritoneal soft tissue nodule in the low midline abdomen is increased in size, consistent with worsened peritoneal metastatic disease. Multiple additional soft tissue nodules throughout the pelvis are unchanged. 2. 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 3. Interval resolution of previously seen pleural effusions. 4. Significant interval improvement of previously seen heterogeneous and ground-glass airspace opacity, with small residual opacities present in the lower lobes. Findings are consistent with improved nonspecific infection or inflammation. 5. Status post hysterectomy. Electronically Signed   By: Jearld Lesch M.D.   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 Follow-up. * Tracking Code: BO * EXAM: CT CHEST, ABDOMEN, AND PELVIS WITH CONTRAST TECHNIQUE: Multidetector CT imaging of the chest, abdomen and pelvis was performed following the standard protocol during bolus administration of intravenous contrast. RADIATION DOSE REDUCTION: This exam was performed according to the departmental dose-optimization program which includes automated exposure control, adjustment of the mA and/or kV according to patient size and/or use of iterative reconstruction technique. CONTRAST:  OMNIPAQUE IOHEXOL 300 MG/ML  SOLN COMPARISON:  Multiple priors including most recent CT June 18, 2023 FINDINGS: CT CHEST FINDINGS Cardiovascular: Aortic atherosclerosis. Accessed right chest Port-A-Cath with tip at the superior cavoatrial junction. No central pulmonary embolus on this nondedicated study. Normal size heart. No significant pericardial effusion/thickening. Mediastinum/Nodes: No suspicious thyroid nodule. No pathologically enlarged mediastinal, hilar or axillary lymph nodes. Small hiatal hernia. Lungs/Pleura: Bilateral solid pulmonary nodules, some of which are stable in size others are increased in size. For reference: -left upper lobe pulmonary nodule measuring 6 mm on image 77/2 previously measured 3 mm. -subpleural right lower lobe pulmonary nodule measures 9 mm on image 77/7, unchanged. A few new scattered tiny pulmonary nodules for instance a 3 mm pulmonary nodule in the right lower lobe on image 87/7. Musculoskeletal: No aggressive lytic or blastic lesion of bone. Multilevel degenerative changes spine. CT ABDOMEN PELVIS FINDINGS Hepatobiliary: Stable 9 mm hypodense lesion in the dome of the liver previously characterized as a cyst on MRI July 04, 2021. No suspicious hepatic lesion. Gallbladder is unremarkable. No biliary ductal dilation. Pancreas: No pancreatic ductal dilation  or evidence of acute inflammation. Spleen: No splenomegaly. Adrenals/Urinary  Tract: Bilateral adrenal glands appear normal. No hydronephrosis. Stomach/Bowel: No radiopaque enteric contrast material was administered. Stomach is unremarkable for degree of distension. Normal appendix. No pathologic dilation of small or large bowel. Vascular/Lymphatic: Aortic atherosclerosis. Normal caliber abdominal aorta. Smooth IVC contours. The portal, splenic and superior mesenteric veins are patent. No pathologically enlarged abdominal or pelvic lymph nodes. Reproductive: Prior hysterectomy with increased size of the vaginal cuff nodules previously indexed nodule in the right vaginal cuff measures 3.2 cm on image 116/2 previously 2.0 cm. Other: Increased size and number of peritoneal/omental soft tissue implants. For reference: -previously indexed soft tissue nodule in the anterior pelvis now measures 5.7 x 3.6 cm on image 105/2 previously 2.8 x 2.5 cm. -new pelvic soft tissue nodule measures 2.3 cm on image 106/2. Trace free fluid in the pelvis with a new walled-off pelvic fluid collection measuring 3.9 cm adjacent to a soft tissue implant on image 105/2. Musculoskeletal: No aggressive lytic or blastic lesion of bone. IMPRESSION: 1. Worsening peritoneal carcinomatosis, increased size of the vaginal cuff nodules, and increased size/of bilateral solid pulmonary nodules, consistent with worsening metastatic disease. 2. Aortic Atherosclerosis (ICD10-I70.0). Electronically Signed   By: Maudry Mayhew M.D.   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       PHYSICAL EXAMINATION: ECOG PERFORMANCE STATUS: 0 - Asymptomatic  Vitals:   12/30/23 0826  BP: 121/70  Pulse: 79  Resp: 18  Temp: 98 F (36.7 C)  SpO2: 98%   Filed Weights   12/30/23 0826  Weight: 198 lb 9.6 oz (90.1 kg)    GENERAL:alert, no distress and comfortable NEURO: alert & oriented x 3 with fluent  speech, no focal motor/sensory deficits  LABORATORY DATA:  I have reviewed the data as listed    Component Value Date/Time   NA 137 12/30/2023 0716   K 3.9 12/30/2023 0716   CL 103 12/30/2023 0716   CO2 28 12/30/2023 0716   GLUCOSE 100 (H) 12/30/2023 0716   BUN 16 12/30/2023 0716   CREATININE 0.62 12/30/2023 0716   CALCIUM 9.6 12/30/2023 0716   PROT 6.5 12/30/2023 0716   ALBUMIN 3.5 12/30/2023 0716   AST 19 12/30/2023 0716   ALT 18 12/30/2023 0716   ALKPHOS 101 12/30/2023 0716   BILITOT 0.3 12/30/2023 0716   GFRNONAA >60 12/30/2023 0716    No results found for: "SPEP", "UPEP"  Lab Results  Component Value Date   WBC 9.0 12/30/2023   NEUTROABS 4.3 12/30/2023   HGB 11.9 (L) 12/30/2023   HCT 35.0 (L) 12/30/2023   MCV 96.7 12/30/2023   PLT 321 12/30/2023      Chemistry      Component Value Date/Time   NA 137 12/30/2023 0716   K 3.9 12/30/2023 0716   CL 103 12/30/2023 0716   CO2 28 12/30/2023 0716   BUN 16 12/30/2023 0716   CREATININE 0.62 12/30/2023 0716      Component Value Date/Time   CALCIUM 9.6 12/30/2023 0716   ALKPHOS 101 12/30/2023 0716   AST 19 12/30/2023 0716   ALT 18 12/30/2023 0716   BILITOT 0.3 12/30/2023 0716

## 2023-12-30 NOTE — Patient Instructions (Signed)
[facility] - A DEPT OF [facility]  Discharge Instructions: Thank you for choosing [facility] to provide your oncology and hematology care.   If you have a lab appointment with the Cancer Center, please go directly to the Cancer Center and check in at the registration area.   Wear comfortable clothing and clothing appropriate for easy access to any Portacath or PICC line.   We strive to give you quality time with your provider. You may need to reschedule your appointment if you arrive late (15 or more minutes).  Arriving late affects you and other patients whose appointments are after yours.  Also, if you miss three or more appointments without notifying the office, you may be dismissed from the clinic at the provider's discretion.      For prescription refill requests, have your pharmacy contact our office and allow 72 hours for refills to be completed.    Today you received the following chemotherapy and/or immunotherapy agents: Gemcitabine (Gemzar) and Docetaxel (Taxotere).      To help prevent nausea and vomiting after your treatment, we encourage you to take your nausea medication as directed.  BELOW ARE SYMPTOMS THAT SHOULD BE REPORTED IMMEDIATELY: *FEVER GREATER THAN 100.4 F (38 C) OR HIGHER *CHILLS OR SWEATING *NAUSEA AND VOMITING THAT IS NOT CONTROLLED WITH YOUR NAUSEA MEDICATION *UNUSUAL SHORTNESS OF BREATH *UNUSUAL BRUISING OR BLEEDING *URINARY PROBLEMS (pain or burning when urinating, or frequent urination) *BOWEL PROBLEMS (unusual diarrhea, constipation, pain near the anus) TENDERNESS IN MOUTH AND THROAT WITH OR WITHOUT PRESENCE OF ULCERS (sore throat, sores in mouth, or a toothache) UNUSUAL RASH, SWELLING OR PAIN  UNUSUAL VAGINAL DISCHARGE OR ITCHING   Items with * indicate a potential emergency and should be followed up as soon as possible or go to the Emergency Department if any problems should occur.  Please show the  CHEMOTHERAPY ALERT CARD or IMMUNOTHERAPY ALERT CARD at check-in to the Emergency Department and triage nurse.  Should you have questions after your visit or need to cancel or reschedule your appointment, please contact [facility] - A DE[facility]  Dept: [phone]  and follow the prompts.  Office hours are 8:00 a.m. to 4:30 p.m. Monday - Friday. Please note that voicemails left after 4:00 p.m. may not be returned until the following business day.  We are closed weekends and major holidays. You have access to a nurse at all times for urgent questions. Please call the main number to the clinic Dept: [phone] and follow the prompts.   For any non-urgent questions, you may also contact your provider using MyChart. We now offer e-Visits for anyone 4 and older to request care online for non-urgent symptoms. For details visit [email or web address].   Also download the MyChart app! Go to the app store, search "MyChart", open the app, select [facility], and log in with your MyChart username and password.

## 2023-12-30 NOTE — Assessment & Plan Note (Signed)
So far, she tolerated treatment well without major side effects Her blood count is stable We will proceed with treatment as scheduled I plan to repeat imaging study next week

## 2023-12-30 NOTE — Assessment & Plan Note (Signed)
This is likely due to recent treatment. The patient denies recent history of bleeding such as epistaxis, hematuria or hematochezia. She is asymptomatic from the anemia. I will observe for now.  She does not require transfusion now. I will continue the chemotherapy at current dose without dosage adjustment.  If the anemia gets progressive worse in the future, I might have to delay her treatment or adjust the chemotherapy dose.

## 2023-12-31 ENCOUNTER — Telehealth: Payer: Self-pay

## 2023-12-31 NOTE — Telephone Encounter (Signed)
Called and told her insurance will only pay for her to go to [facility] imaging. Given appt on 2/24 at 2:40 pm, liquids only 4 hours prior to appt. She verbalized understanding.

## 2024-01-04 ENCOUNTER — Other Ambulatory Visit: Payer: Self-pay | Admitting: Hematology and Oncology

## 2024-01-04 MED ORDER — LORAZEPAM 0.5 MG PO TABS: 0.5000 mg | ORAL_TABLET | Freq: Two times a day (BID) | ORAL | 0 refills | Status: AC | PRN

## 2024-01-05 ENCOUNTER — Other Ambulatory Visit: Payer: Self-pay | Admitting: Hematology and Oncology

## 2024-01-06 ENCOUNTER — Ambulatory Visit (HOSPITAL_COMMUNITY): Admission: RE | Admit: 2024-01-06 | Payer: Managed Care, Other (non HMO) | Source: Ambulatory Visit

## 2024-01-10 ENCOUNTER — Encounter: Payer: Self-pay | Admitting: Hematology and Oncology

## 2024-01-10 ENCOUNTER — Ambulatory Visit
Admission: RE | Admit: 2024-01-10 | Discharge: 2024-01-10 | Disposition: A | Discharge status: Home / Self Care | Payer: Managed Care, Other (non HMO) | Source: Ambulatory Visit | Attending: Hematology and Oncology | Admitting: Hematology and Oncology

## 2024-01-10 ENCOUNTER — Telehealth: Payer: Self-pay

## 2024-01-10 DIAGNOSIS — C549 Malignant neoplasm of corpus uteri, unspecified: Secondary | ICD-10-CM

## 2024-01-10 MED ORDER — IOPAMIDOL (ISOVUE-300) INJECTION 61%
100.0000 mL | Freq: Once | INTRAVENOUS | Status: AC | PRN
  Administered 2024-01-10: 100 mL via INTRAVENOUS

## 2024-01-10 MED ORDER — HEPARIN SOD (PORK) LOCK FLUSH 100 UNIT/ML IV SOLN
500.0000 [IU] | Freq: Once | INTRAVENOUS | Status: AC
  Administered 2024-01-10: 500 [IU] via INTRAVENOUS

## 2024-01-10 MED ORDER — SODIUM CHLORIDE 0.9% FLUSH
10.0000 mL | INTRAVENOUS | Status: DC | PRN
  Administered 2024-01-10: 10 mL via INTRAVENOUS

## 2024-01-13 ENCOUNTER — Inpatient Hospital Stay: Payer: Managed Care, Other (non HMO)

## 2024-01-13 ENCOUNTER — Ambulatory Visit: Payer: Managed Care, Other (non HMO)

## 2024-01-13 ENCOUNTER — Inpatient Hospital Stay (HOSPITAL_BASED_OUTPATIENT_CLINIC_OR_DEPARTMENT_OTHER): Payer: Managed Care, Other (non HMO) | Admitting: Hematology and Oncology

## 2024-01-13 ENCOUNTER — Encounter: Payer: Self-pay | Admitting: Hematology and Oncology

## 2024-01-13 ENCOUNTER — Ambulatory Visit: Payer: Managed Care, Other (non HMO) | Admitting: Hematology and Oncology

## 2024-01-13 ENCOUNTER — Other Ambulatory Visit: Payer: Managed Care, Other (non HMO)

## 2024-01-13 VITALS — BP 134/77 | HR 99 | Temp 97.7°F | Resp 18 | Ht 67.0 in | Wt 198.6 lb

## 2024-01-13 DIAGNOSIS — C549 Malignant neoplasm of corpus uteri, unspecified: Secondary | ICD-10-CM

## 2024-01-13 DIAGNOSIS — Z7189 Other specified counseling: Secondary | ICD-10-CM

## 2024-01-13 DIAGNOSIS — Z5111 Encounter for antineoplastic chemotherapy: Secondary | ICD-10-CM | POA: Diagnosis not present

## 2024-01-13 LAB — CMP (CANCER CENTER ONLY)
ALT: 14 U/L (ref 0–44)
AST: 19 U/L (ref 15–41)
Albumin: 3.6 g/dL (ref 3.5–5.0)
Alkaline Phosphatase: 113 U/L (ref 38–126)
Anion gap: 7 (ref 5–15)
BUN: 8 mg/dL (ref 8–23)
CO2: 28 mmol/L (ref 22–32)
Calcium: 9.1 mg/dL (ref 8.9–10.3)
Chloride: 104 mmol/L (ref 98–111)
Creatinine: 0.61 mg/dL (ref 0.44–1.00)
GFR, Estimated: >60 mL/min (ref >60)
Glucose, Bld: 108 mg/dL — ABNORMAL HIGH (ref 70–99)
Potassium: 4 mmol/L (ref 3.5–5.1)
Sodium: 139 mmol/L (ref 135–145)
Total Bilirubin: 0.3 mg/dL (ref 0.0–1.2)
Total Protein: 6.6 g/dL (ref 6.5–8.1)

## 2024-01-13 LAB — CBC WITH DIFFERENTIAL (CANCER CENTER ONLY)
Abs Immature Granulocytes: 0.66 10*3/uL — ABNORMAL HIGH (ref 0.00–0.07)
Basophils Absolute: 0.2 10*3/uL — ABNORMAL HIGH (ref 0.0–0.1)
Basophils Relative: 2 %
Eosinophils Absolute: 0.2 10*3/uL (ref 0.0–0.5)
Eosinophils Relative: 1 %
HCT: 35.7 % — ABNORMAL LOW (ref 36.0–46.0)
Hemoglobin: 12 g/dL (ref 12.0–15.0)
Immature Granulocytes: 5 %
Lymphocytes Relative: 16 %
Lymphs Abs: 2.2 10*3/uL (ref 0.7–4.0)
MCH: 32.7 pg (ref 26.0–34.0)
MCHC: 33.6 g/dL (ref 30.0–36.0)
MCV: 97.3 fL (ref 80.0–100.0)
Monocytes Absolute: 1.3 10*3/uL — ABNORMAL HIGH (ref 0.1–1.0)
Monocytes Relative: 10 %
Neutro Abs: 8.7 10*3/uL — ABNORMAL HIGH (ref 1.7–7.7)
Neutrophils Relative %: 66 %
Platelet Count: 200 10*3/uL (ref 150–400)
RBC: 3.67 MIL/uL — ABNORMAL LOW (ref 3.87–5.11)
RDW: 17.8 % — ABNORMAL HIGH (ref 11.5–15.5)
WBC Count: 13.2 10*3/uL — ABNORMAL HIGH (ref 4.0–10.5)
nRBC: 0.2 % (ref 0.0–0.2)

## 2024-01-13 MED ORDER — SODIUM CHLORIDE 0.9% FLUSH
10.0000 mL | Freq: Once | INTRAVENOUS | Status: AC
  Administered 2024-01-13: 10 mL

## 2024-01-13 NOTE — Assessment & Plan Note (Addendum)
 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

## 2024-01-13 NOTE — Progress Notes (Signed)
 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 High grade and LMS, 50% ER/PR positive dMMR normal, PD-L1 CPS 3% Failed doxorubicin and progressed on gemcitabine and taxotere and letrozole plus Lupron with tamoxifen  The patient has failed multiple different chemotherapy options and hormonal manipulations CT imaging from February 2025 unfortunately showed disease progression We discussed treatment options and NCCN guidelines We discussed potential referral to tertiary center for second opinion We discussed risk, benefits, side effects of trabectedin She is undecided She will call me tomorrow for final decision  No orders of the defined types were placed in this encounter.    Artis Delay, MD  INTERVAL HISTORY: she returns for chemo follow-up Complications related to previous cycle of chemotherapy included anemia, and fatigue, some nausea and constipation  PHYSICAL EXAMINATION: ECOG PERFORMANCE STATUS: 1 - Symptomatic but completely ambulatory  Vitals:   01/13/24 0803  BP: 134/77  Pulse: 99  Resp: 18  Temp: 97.7 F (36.5 C)  SpO2: 100%   Filed Weights   01/13/24 0803  Weight: 198 lb 9.6 oz (90.1 kg)    Relevant data for this visit included recent blood work and imaging studies  SUMMARY OF ONCOLOGIC HISTORY: Oncology History Overview Note  High grade and LMS, 50% ER/PR positive dMMR normal, PD-L1 CPS 3% Failed doxorubicin and progressed on gemcitabine and taxotere and letrozole plus Lupron with tamoxifen   Uterine sarcoma (HCC)  06/02/2021 Imaging   MRI pelvis Heterogeneous  9.0 cm intrauterine mass within the intramural/submucosal anterior fundus demonstrating interval growth, internal enhancement, and restricted diffusion 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 No extra uterine extension. No evidence of metastatic disease within the pelvis.   Mild thickening of the endometrial stripe, likely related to entrapment by the adjacent uterine mass   06/03/2021 Initial Diagnosis   Uterine sarcoma (HCC)   06/03/2021 Cancer Staging   Staging form: Corpus Uteri - Sarcoma, AJCC 7th Edition - Clinical stage from 06/03/2021: FIGO Stage IVB (rT1b, N0, M1) - Signed by Artis Delay, MD on 03/31/2022 Diagnostic confirmation: Positive histology Stage prefix: Recurrence Biopsy of metastatic site performed: No Lymph-vascular invasion (LVI): LVI present/identified, NOS   06/03/2021 Pathology Results   FINAL MICROSCOPIC DIAGNOSIS:   A. UTERUS, CERVIX, BILATERAL FALLOPIAN TUBES AND OVARIES, HYSTERECTOMY:  - Uterus:       Mixed high grade uterine sarcoma, spanning 6 cm, see comment.       Extensive lymphovascular invasion.       See oncology table.  - Cervix: Benign squamous and endocervical mucosa. No dysplasia or  malignancy.  - Bilateral ovaries: Unremarkable. No malignancy.  - Bilateral fallopian tubes: Unremarkable. No malignancy.   ONCOLOGY TABLE:   UTERUS, SARCOMA: Resection   Procedure: Total hysterectomy and bilateral salpingo-oophorectomy  Specimen Integrity: Focally disrupted on posterior surface  Tumor Site: Anterior wall  Tumor Size: 6 cm  Histologic Type: High grade sarcoma, mixed. See comment.  Other Tissue/ Organ Involvement: Not identified  Lymphovascular Invasion: Present, extensive.  Margins: All margins negative for tumor  Regional Lymph Nodes: Not applicable (no lymph nodes submitted or found)  Distant Metastasis:  Distant Site(s) Involved: Not applicable  Pathologic Stage Classification (pTNM, AJCC 8th  Edition): pT1b, pN not  assigned  Ancillary Studies: Can be performed upon request  Representative Tumor Block: A6  Comment(s): The tumor has two morphologically different components. One component consists of malignant spindle cells which can be seen arising from the smooth muscle. These foci are positive for SMA, desmin, cyclinD1 (focal), and CD10 (patchy). The other component consists of round to slightly spindle cells with abundant admixed vessels and occasional large pleomorphic cells. These areas are positive for SMA (patchy weak), desmin (focal), CD10 (variable with diffuse areas). Pancytokeratin is negative. 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   ADDENDUM:   PROGNOSTIC INDICATOR RESULTS:   Immunohistochemical and morphometric analysis performed manually    Estrogen Receptor:       POSITIVE, 50%, WEAK TO MODERATE STAINING  Progesterone Receptor:   POSITIVE, 50%, MODERATE TO STRONG STAINING   Reference Range Estrogen and Progesterone Receptor       Negative  0%       Positive  >1%    06/03/2021 Surgery   Surgeon: Quinn Axe    Assistants: Dr Antionette Char (an MD assistant was necessary for tissue manipulation, management of robotic instrumentation, retraction and positioning due to the complexity of the case and hospital policies).  Operation: Robotic-assisted laparoscopic total hysterectomy >250gm with bilateral salpingoophorectomy, minilaparotomy for specimen delivery   Surgeon: Quinn Axe    Operative Findings:  : Bulky 16cm uterus with intra-uterine mass (consistent with a fibroid-like mass) , smooth normal appearing serosa, no suspicious bulky nodes. Normal ovaries bilaterally. Normal upper abdomen. Uterus too large to deliver vaginally in tact.    06/18/2021 Imaging   1. Multiple bilateral pulmonary nodules, measuring up to 9 mm. Most of these are perifissural and subpleural  in location, likely lymph nodes. Nevertheless, close follow-up recommended to exclude metastatic disease. 2. 11 mm hypoattenuating lesion towards the dome of the liver, indeterminate. MRI abdomen with and without contrast could be used to further evaluate as clinically warranted. 3. Aortic Atherosclerosis (ICD10-I70.0).   10/13/2021 Imaging   1. No acute findings within the abdomen or pelvis. No specific findings identified to suggest residual or recurrence of tumor. 2. Stable small pulmonary nodules within the left lower lobe.   03/18/2022 Imaging   1. Interval development of 2 soft tissue nodules in the anterior pelvis measuring 10 mm and 7 mm respectively. Close follow-up recommended to exclude metastatic disease. PET-CT may be warranted to further evaluate. 2. Development of 2 mm left upper lobe parenchymal nodule, nonspecific. Close attention on follow-up imaging recommended  3. Stable appearance of index bilateral pulmonary nodules identified previously. 4. Aortic Atherosclerosis (ICD10-I70.0).   04/02/2022 PET scan   1. Signs of local tumor recurrence noted at the vaginal cuff. 2. 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 New since 10/13/2021 and progressive when compared with 03/17/2022. 3. New focal area of increased uptake within the right biceps femoris muscle which is suspicious for skeletal muscle involvement. 4. Stable appearance of small pulmonary nodules described on 03/17/2022. These are too small to characterize by PET-CT.   04/09/2022 Procedure   Successful placement of a right IJ approach Power Port with ultrasound and fluoroscopic guidance. The catheter is ready for use.   04/09/2022 Echocardiogram    1. Left ventricular ejection fraction, by estimation, is 65 to 70%. The left ventricle has normal function. The left ventricle  has no regional wall motion abnormalities. There is mild concentric left ventricular hypertrophy. Left  ventricular diastolic parameters are consistent with Grade I diastolic dysfunction (impaired relaxation).  2. Right ventricular systolic function is normal. The right ventricular size is normal.  3. The mitral valve is normal in structure. No evidence of mitral valve regurgitation.  4. The aortic valve is tricuspid. Aortic valve regurgitation is not visualized. No aortic stenosis is present.  5. The inferior vena cava is normal in size with greater than 50% respiratory variability, suggesting right atrial pressure of 3 mmHg.   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 Status post hysterectomy and bilateral oophorectomy. Multiple newand enlarged peritoneal nodules throughout the low abdomen and pelvis, previously FDG avid and consistent with worsened locally recurrent and peritoneal metastatic disease. 2. Multiple small bilateral pulmonary nodules, several of which are slightly enlarged compared to prior examination, consistent with worsened pulmonary metastatic disease.   Aortic Atherosclerosis (ICD10-I70.0).     06/25/2022 - 03/04/2023 Chemotherapy   Patient is on Treatment Plan : UTERINE UNDIFFERENTIATED LEIOMYOSARCOMA Gemcitabine D1,8 + Docetaxel D8 (900/100) q21d     09/02/2022 Imaging   1. Moderate response to therapy of peritoneal metastasis within the anterior pelvis. 2. Minimal response to therapy of pulmonary metastasis. 3. No new or progressive disease. 4.  Aortic Atherosclerosis (ICD10-I70.0).     12/01/2022 Imaging   1. No new or progressive findings in the chest, abdomen, or pelvis. 2. Peritoneal metastases in the pelvis described previously measure smaller today compatible with interval response to therapy. 3. Stable tiny bilateral pulmonary nodules. Previously characterized as metastatic lesions, these are unchanged in the interval. No new suspicious pulmonary nodule or  mass. 4.  Aortic Atherosclerois (ICD10-170.0)   03/04/2023 Imaging   CT CHEST ABDOMEN PELVIS W CONTRAST  Result Date: 03/03/2023 CLINICAL DATA:  Cervical cancer * Tracking Code: BO *. Assess treatment response. Current chemotherapy. Prior total hysterectomy EXAM: CT CHEST, ABDOMEN, AND PELVIS WITH CONTRAST TECHNIQUE: Multidetector CT imaging of the chest, abdomen and pelvis was performed following the standard protocol during bolus administration of intravenous contrast. RADIATION DOSE REDUCTION: This exam was performed according to the departmental dose-optimization program which includes automated exposure control, adjustment of the mA and/or kV according to patient size and/or use of iterative reconstruction technique. CONTRAST:  OMNIPAQUE IOHEXOL 300 MG/ML  SOLN COMPARISON:  CT 12/01/2022 and older FINDINGS: CT CHEST FINDINGS Cardiovascular: Right upper chest port. Heart is nonenlarged. Trace pericardial fluid. Normal caliber thoracic aorta. Mediastinum/Nodes: No specific abnormal lymph node enlargement seen in the axillary region, hilum or mediastinum. Mildly patulous thoracic esophagus. Thyroid gland is unremarkable. Lungs/Pleura: Tiny pleural effusions are seen, left-greater-than-right. These new from previous. There is some patchy parenchymal opacity identified in the superior segment of the lower lobes, left-greater-than-right. This could be infiltrative rather than neoplastic. Recommend short follow-up. There were some small nodules identified on the prior. For example 3 mm nodule lateral left lower lobe subpleural on series 7, image 91 is stable. Right lower lobe subpleural nodule on the prior which measured 9 x 6 mm, today is stable on image 73 of series 7. Stable 2-3 mm nodule right lower lobe on series 7, image 97. Musculoskeletal: Curvature of the spine with some degenerative changes. CT ABDOMEN PELVIS FINDINGS Hepatobiliary: Stable dome hepatic cyst. No new space-occupying lesion in the  liver. Patent portal vein. Gallbladder is  distended. Pancreas: Unremarkable. No pancreatic ductal dilatation or surrounding inflammatory changes. Spleen: Spleen is slightly enlarged at 13.1 cm in AP length. Preserved enhancement. Small splenule inferiorly. Adrenals/Urinary Tract: The adrenal glands are preserved. Tiny low-attenuation cystic lesions are seen which are too small to completely characterize along the kidneys. Bosniak 2 lesions. No specific imaging follow-up. The ureters have normal course and caliber down to the bladder. Bladder wall is diffusely thickened. The bladder is contracted. There is significant stranding. This is progressive from the previous examination. Stomach/Bowel: Large bowel has a normal course and caliber with scattered stool. Overall moderate stool burden. Normal appendix extends inferior to the cecum in the right lower quadrant. The stomach and small bowel are nondilated. Vascular/Lymphatic: Normal caliber aorta and IVC with some vascular calcifications. No specific abnormal lymph node enlargement identified in the abdomen and pelvis. Reproductive: Absence of the uterus and ovaries. Other: Once again there is a pelvic soft tissue nodule anteriorly on series 2, image 104 which on the prior measured 2.1 x 1.6 cm and today 1.9 x 1.3 cm. Smaller foci more caudal on axial image 111 and 112 are stable. Musculoskeletal: Curvature and degenerative changes are seen along the spine. Degenerative changes along the pelvis. IMPRESSION: Stable pelvic peritoneal nodules. No new lymph node enlargement seen in the chest, abdomen or pelvis. New tiny pleural effusions, left-greater-than-right with some ill-defined parenchymal opacities along the lower lobes. Although there is a differential this could be infectious or inflammatory. Recommend short follow-up and correlation to specific symptoms. Previous tiny lung nodules are stable. Continued follow-up surveillance as per the patient's neoplasm. Mild  splenomegaly. Worsening bladder wall thickening and stranding. Please correlate with symptoms and treatment Electronically Signed   By: Karen Kays M.D.   On: 03/03/2023 18:23      04/05/2023 Imaging   Normal bone density scan   06/21/2023 Imaging   CT CHEST ABDOMEN PELVIS W CONTRAST  Result Date: 06/20/2023 CLINICAL DATA:  Endometrial cancer restaging, chemotherapy complete, ongoing oral chemotherapy * Tracking Code: BO * EXAM: CT CHEST, ABDOMEN, AND PELVIS WITH CONTRAST TECHNIQUE: Multidetector CT imaging of the chest, abdomen and pelvis was performed following the standard protocol during bolus administration of intravenous contrast. RADIATION DOSE REDUCTION: This exam was performed according to the departmental dose-optimization program which includes automated exposure control, adjustment of the mA and/or kV according to patient size and/or use of iterative reconstruction technique. CONTRAST:  OMNIPAQUE IOHEXOL 300 MG/ML  SOLN COMPARISON:  CT chest angiogram, 03/23/2023, CT chest abdomen pelvis, 03/02/2023 FINDINGS: CT CHEST FINDINGS Cardiovascular: No significant vascular findings. Normal heart size. No pericardial effusion. Mediastinum/Nodes: No enlarged mediastinal, hilar, or axillary lymph nodes. Thyroid gland, trachea, and esophagus demonstrate no significant findings. Lungs/Pleura: Interval resolution of previously seen pleural effusions. Significant interval improvement of previously seen heterogeneous and ground-glass airspace opacity, with small residual opacities present in the lower lobes (series 4, image 68). Multiple small subpleural nodules are unchanged, largest in the dependent right lower lobe measuring 0.9 cm (series 4, image 75). Musculoskeletal: No chest wall abnormality. No acute osseous findings. CT ABDOMEN PELVIS FINDINGS Hepatobiliary: No solid liver abnormality is seen. Benign simple cyst of the liver dome, requiring no further follow-up or characterization (series 2, image  45). No gallstones, gallbladder wall thickening, or biliary dilatation. Pancreas: Unremarkable. No pancreatic ductal dilatation or surrounding inflammatory changes. Spleen: Normal in size without significant abnormality. Adrenals/Urinary Tract: Adrenal glands are unremarkable. Simple, benign right renal cortical cysts, for which no further follow-up or characterization is  required. Kidneys are otherwise normal, without renal calculi, solid lesion, or hydronephrosis. Bladder is unremarkable. Stomach/Bowel: Stomach is within normal limits. Appendix appears normal. No evidence of bowel wall thickening, distention, or inflammatory changes. Moderate burden of stool throughout the colon and rectum. Vascular/Lymphatic: No significant vascular findings are present. No enlarged abdominal or pelvic lymph nodes. Reproductive: Status post hysterectomy. Other: No abdominal wall hernia or abnormality. No ascites. Largest soft tissue nodule in the low midline abdomen is increased in size, measuring 2.8 x 2.5 cm, previously 2.4 x 2.1 cm (series 2, image 105). Multiple additional soft tissue nodules throughout the pelvis are unchanged, largest in the anterior right pelvis measuring 1.6 x 1.2 cm (series 2, image 112). Additional nodule at the right aspect of the vaginal cuff measures 2.0 x 1.0 cm (series 2, image 112). Musculoskeletal: No acute osseous findings. IMPRESSION: 1. Largest peritoneal soft tissue nodule in the low midline abdomen is increased in size, consistent with worsened peritoneal metastatic disease. Multiple additional soft tissue nodules throughout the pelvis are unchanged. 2. 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 3. Interval resolution of previously seen pleural effusions. 4. Significant interval improvement of previously seen heterogeneous and ground-glass airspace opacity, with small residual opacities present in the lower lobes. Findings are consistent with improved nonspecific  infection or inflammation. 5. Status post hysterectomy. Electronically Signed   By: Jearld Lesch M.D.   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 * Tracking Code: BO * EXAM: CT CHEST, ABDOMEN, AND PELVIS WITH CONTRAST TECHNIQUE: Multidetector CT imaging of the chest, abdomen and pelvis was performed following the standard protocol during bolus administration of intravenous contrast. RADIATION DOSE REDUCTION: This exam was performed according to the departmental dose-optimization program which includes automated exposure control, adjustment of the mA and/or kV according to patient size and/or use of iterative reconstruction technique. CONTRAST:  OMNIPAQUE IOHEXOL 300 MG/ML  SOLN COMPARISON:  Multiple priors including most recent CT June 18, 2023 FINDINGS: CT CHEST FINDINGS Cardiovascular: Aortic atherosclerosis. Accessed right chest Port-A-Cath with tip at the superior cavoatrial junction. No central pulmonary embolus on this nondedicated study. Normal size heart. No significant pericardial effusion/thickening. Mediastinum/Nodes: No suspicious thyroid nodule. No pathologically enlarged mediastinal, hilar or axillary lymph nodes. Small hiatal hernia. Lungs/Pleura: Bilateral solid pulmonary nodules, some of which are stable in size others are increased in size. For reference: -left upper lobe pulmonary nodule measuring 6 mm on image 77/2 previously measured 3 mm. -subpleural right lower lobe pulmonary nodule measures 9 mm on image 77/7, unchanged. A few new scattered tiny pulmonary nodules for instance a 3 mm pulmonary nodule in the right lower lobe on image 87/7. Musculoskeletal: No aggressive lytic or blastic lesion of bone. Multilevel degenerative changes spine. CT ABDOMEN PELVIS FINDINGS Hepatobiliary: Stable 9 mm hypodense lesion in the dome of the liver previously  characterized as a cyst on MRI July 04, 2021. No suspicious hepatic lesion. Gallbladder is unremarkable. No biliary ductal dilation. Pancreas: No pancreatic ductal dilation or evidence of acute inflammation. Spleen: No splenomegaly. Adrenals/Urinary Tract: Bilateral adrenal glands appear normal. No hydronephrosis. Stomach/Bowel: No radiopaque enteric contrast material was administered. Stomach is unremarkable for degree of distension. Normal appendix. No pathologic dilation of small or large bowel. Vascular/Lymphatic: Aortic atherosclerosis. Normal caliber abdominal aorta. Smooth IVC contours. The portal, splenic and superior mesenteric veins are patent. No pathologically enlarged abdominal  or pelvic lymph nodes. Reproductive: Prior hysterectomy with increased size of the vaginal cuff nodules previously indexed nodule in the right vaginal cuff measures 3.2 cm on image 116/2 previously 2.0 cm. Other: Increased size and number of peritoneal/omental soft tissue implants. For reference: -previously indexed soft tissue nodule in the anterior pelvis now measures 5.7 x 3.6 cm on image 105/2 previously 2.8 x 2.5 cm. -new pelvic soft tissue nodule measures 2.3 cm on image 106/2. Trace free fluid in the pelvis with a new walled-off pelvic fluid collection measuring 3.9 cm adjacent to a soft tissue implant on image 105/2. Musculoskeletal: No aggressive lytic or blastic lesion of bone. IMPRESSION: 1. Worsening peritoneal carcinomatosis, increased size of the vaginal cuff nodules, and increased size/of bilateral solid pulmonary nodules, consistent with worsening metastatic disease. 2. Aortic Atherosclerosis (ICD10-I70.0). Electronically Signed   By: Maudry Mayhew M.D.   On: 09/19/2023 12:38      10/05/2023 - 12/30/2023 Chemotherapy   Patient is on Treatment Plan : UTERINE UNDIFFERENTIATED LEIOMYOSARCOMA Gemcitabine D1,8 + Docetaxel D8 (900/100) q21d     01/10/2024 Imaging   CT CHEST ABDOMEN PELVIS W CONTRAST Result  Date: 01/13/2024 CLINICAL DATA:  Musculoskeletal neoplasm, assess treatment response uterine sarcoma assess response to chemo. * Tracking Code: BO * EXAM: CT CHEST, ABDOMEN, AND PELVIS WITH CONTRAST TECHNIQUE: Multidetector CT imaging of the chest, abdomen and pelvis was performed following the standard protocol during bolus administration of intravenous contrast. RADIATION DOSE REDUCTION: This exam was performed according to the departmental dose-optimization program which includes automated exposure control, adjustment of the mA and/or kV according to patient size and/or use of iterative reconstruction technique. CONTRAST:  ISOVUE-300 IOPAMIDOL (ISOVUE-300) INJECTION 61% COMPARISON:  CT scan chest, abdomen and pelvis from 09/17/2023. FINDINGS: CT CHEST FINDINGS Cardiovascular: Normal cardiac size. No pericardial effusion. No aortic aneurysm. Mediastinum/Nodes: Visualized thyroid gland appears grossly unremarkable. No solid / cystic mediastinal masses. The esophagus is nondistended precluding optimal assessment. No axillary, mediastinal or hilar lymphadenopathy by size criteria. Lungs/Pleura: The central tracheo-bronchial tree is patent. Redemonstration of multiple (more than 20), solid bilateral lung nodules with largest in the right lung lower lobe measuring 8.2 x 9.5 mm (previously 6.7 x 7.5 mm) and largest in the left lung inferior lingula measuring 6.9 x 7.7 mm (previously 5.2 x 5.8 mm). No new mass or consolidation. No pleural effusion or pneumothorax. Musculoskeletal: A CT Port-a-Cath is seen in the right upper chest wall with the catheter terminating in the lower portion of superior vena cava. Visualized soft tissues of the chest wall are otherwise grossly unremarkable. No suspicious osseous lesions. There are mild multilevel degenerative changes in the visualized spine. CT ABDOMEN PELVIS FINDINGS Hepatobiliary: The liver is normal in size. Non-cirrhotic configuration. There is stable cyst in the  right hepatic lobe. Mild diffuse hepatic steatosis. No suspicious liver lesion. No intrahepatic or extrahepatic bile duct dilation. No calcified gallstones. Normal gallbladder wall thickness. No pericholecystic inflammatory changes. Pancreas: Unremarkable. No pancreatic ductal dilatation or surrounding inflammatory changes. Spleen: Within normal limits. No focal lesion. Adrenals/Urinary Tract: Adrenal glands are unremarkable. No suspicious renal mass. There are stable subcentimeter partially exophytic simple cyst in the right kidney lower pole, medially. No nephroureterolithiasis or obstructive uropathy. Urinary bladder is under distended, precluding optimal assessment. However, no large mass or stones identified. No perivesical fat stranding. Redemonstration of prominent perivesical vessels, similar to the prior study. Stomach/Bowel: No disproportionate dilation of the small or large bowel loops. No evidence of abnormal bowel wall thickening or inflammatory  changes. The appendix is unremarkable. Vascular/Lymphatic: No ascites or pneumoperitoneum. Redemonstration of multiple enlarged, heterogeneous, hyperattenuating tumor deposits mainly in the pelvis. There is significant interval increase in the size and enhancement when compared to the prior exam. Largest such deposit in the lower pelvis, anteriorly measures 6.6 x 7.9 cm on today's exam which measured up to 3.6 x 5.7 cm on the prior exam. No abdominal or pelvic lymphadenopathy, by size criteria. No aneurysmal dilation of the major abdominal arteries. There are mild peripheral atherosclerotic vascular calcifications of the aorta and its major branches. Reproductive: The uterus is surgically absent. There are multiple tumor deposits surrounding the vaginal cuff and in the pelvis, as described above. Other: The visualized soft tissues and abdominal wall are unremarkable. Musculoskeletal: No suspicious osseous lesions. There are mild multilevel degenerative changes  in the visualized spine. IMPRESSION: 1. Overall, worsening metastatic disease. 2. Interval increase in the size of multiple bilateral solid lung nodules, as described in detail above. 3. Redemonstration of multiple enlarged, heterogeneous, hyperattenuating tumor deposits mainly in the pelvis. There is significant interval increase in the size and enhancement when compared to the prior exam. 4. No new metastatic disease identified within the chest, abdomen or pelvis. 5. Multiple other nonacute observations, as described above. Aortic Atherosclerosis (ICD10-I70.0). Electronically Signed   By: Jules Schick M.D.   On: 01/13/2024 10:41

## 2024-01-13 NOTE — Assessment & Plan Note (Signed)
 Counseled and coordinated advanced care planning today.  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 Treatment offered is palliative in nature. We discussed a realistic and effective plan that will be reviewed and updated on an ongoing basis as indicated. We discussed the following: Risks, benefits and alternatives to the various treatment options Discussed patient's personal belief/values/goals Discussed palliative care options, ways to avoid hospitalization and patient's desire for care if decision making capacity is affected Discussed MOST form, Advanced Directive and Code Status I requested copies of her advance directives The patient has expressed desire for DO NOT RESUSCITATE in the event of terminal illness

## 2024-01-14 ENCOUNTER — Other Ambulatory Visit: Payer: Self-pay | Admitting: Hematology and Oncology

## 2024-01-14 ENCOUNTER — Telehealth: Payer: Self-pay

## 2024-01-14 DIAGNOSIS — I5032 Chronic diastolic (congestive) heart failure: Secondary | ICD-10-CM

## 2024-01-14 DIAGNOSIS — C549 Malignant neoplasm of corpus uteri, unspecified: Secondary | ICD-10-CM

## 2024-01-14 NOTE — Telephone Encounter (Signed)
 She called and left a message that she has decided to get trabectedin. She would like to echo at [facility] id possible.

## 2024-01-14 NOTE — Telephone Encounter (Signed)
 I ordered echo, please schedule I sent LOS to start chemo on 3/13, please keep an eye on the appt

## 2024-01-14 NOTE — Telephone Encounter (Signed)
 Called and left left below message. Left echo appt details with appt at [facility] on 3/12 at 1130, arrive 15 mins before appt.

## 2024-01-14 NOTE — Progress Notes (Signed)
 DISCONTINUE ON PATHWAY REGIMEN - Uterine     A cycle is every 21 days:     Gemcitabine      Docetaxel      [id number]   **Always confirm dose/schedule in your pharmacy ordering system**  PRIOR TREATMENT: [id number]: Docetaxel 100 mg/m2 D8 + Gemcitabine 900 mg/m2 D1, 8 q21 Days x 6 Cycles  START OFF PATHWAY REGIMEN - Uterine   [id number]:Trabectedin 1.5 mg/m2 CIV D1 q21 Days:   A cycle is every 21 days:     Trabectedin   **Always confirm dose/schedule in your pharmacy ordering system**  Patient Characteristics: High Grade Undifferentiated/Leiomyosarcoma, Recurrent/Progressive Disease, Unresectable, HER2 Expression Equivocal/Negative/Unknown (IHC ? 2+) Histology: High Grade Undifferentiated/Leiomyosarcoma Therapeutic Status: Recurrent or Progressive Disease Resectability Status: Unresectable HER2 Expression Status by IHC: Negative (IHC 0, 1+) Intent of Therapy: Non-Curative / Palliative Intent, Discussed with Patient

## 2024-01-15 ENCOUNTER — Other Ambulatory Visit: Payer: Self-pay

## 2024-01-17 ENCOUNTER — Telehealth: Payer: Self-pay | Admitting: Hematology and Oncology

## 2024-01-17 ENCOUNTER — Encounter: Payer: Self-pay | Admitting: Hematology and Oncology

## 2024-01-17 ENCOUNTER — Encounter: Payer: Self-pay | Admitting: Obstetrics & Gynecology

## 2024-01-18 ENCOUNTER — Other Ambulatory Visit: Payer: Self-pay

## 2024-01-20 NOTE — Progress Notes (Signed)
 Pharmacist Chemotherapy Monitoring - Initial Assessment    Anticipated start date: 01/27/24   The following has been reviewed per standard work regarding the patient's treatment regimen: The patient's diagnosis, treatment plan and drug doses, and organ/hematologic function Lab orders and baseline tests specific to treatment regimen  The treatment plan start date, drug sequencing, and pre-medications Prior authorization status  Patient's documented medication list, including drug-drug interaction screen and prescriptions for anti-emetics and supportive care specific to the treatment regimen The drug concentrations, fluid compatibility, administration routes, and timing of the medications to be used The patient's access for treatment and lifetime cumulative dose history, if applicable  The patient's medication allergies and previous infusion related reactions, if applicable   Changes made to treatment plan:  N/A  Follow up needed:  Pending authorization for treatment  and ECHO   Drusilla Kanner, PharmD, MBA

## 2024-01-22 ENCOUNTER — Encounter: Payer: Self-pay | Admitting: Hematology and Oncology

## 2024-01-26 ENCOUNTER — Ambulatory Visit (HOSPITAL_COMMUNITY)
Admission: RE | Admit: 2024-01-26 | Discharge: 2024-01-26 | Disposition: A | Discharge status: Home / Self Care | Payer: Managed Care, Other (non HMO) | Source: Ambulatory Visit | Attending: Hematology and Oncology | Admitting: Hematology and Oncology

## 2024-01-26 DIAGNOSIS — C549 Malignant neoplasm of corpus uteri, unspecified: Secondary | ICD-10-CM | POA: Insufficient documentation

## 2024-01-26 DIAGNOSIS — I5032 Chronic diastolic (congestive) heart failure: Secondary | ICD-10-CM | POA: Insufficient documentation

## 2024-01-26 DIAGNOSIS — I517 Cardiomegaly: Secondary | ICD-10-CM

## 2024-01-26 LAB — ECHOCARDIOGRAM COMPLETE
AR max vel: 2.18 cm2
AV Area VTI: 2.1 cm2
AV Area mean vel: 2.23 cm2
AV Mean grad: 5 mmHg
AV Peak grad: 8.6 mmHg
Ao pk vel: 1.47 m/s
Area-P 1/2: 4.19 cm2
S' Lateral: 2.4 cm

## 2024-01-26 MED FILL — Dexamethasone Sodium Phosphate Inj 100 MG/10ML: INTRAMUSCULAR | Qty: 2 | Status: AC

## 2024-01-26 NOTE — Progress Notes (Signed)
*  PRELIMINARY RESULTS* Echocardiogram 2D Echocardiogram has been performed.  Laddie Aquas 01/26/2024, 11:48 AM

## 2024-01-27 ENCOUNTER — Telehealth: Payer: Self-pay

## 2024-01-27 ENCOUNTER — Encounter: Payer: Self-pay | Admitting: Hematology and Oncology

## 2024-01-27 ENCOUNTER — Inpatient Hospital Stay: Attending: Gynecologic Oncology

## 2024-01-27 ENCOUNTER — Inpatient Hospital Stay: Admitting: Hematology and Oncology

## 2024-01-27 ENCOUNTER — Other Ambulatory Visit: Payer: Self-pay | Admitting: Hematology and Oncology

## 2024-01-27 ENCOUNTER — Inpatient Hospital Stay

## 2024-01-27 VITALS — HR 97

## 2024-01-27 VITALS — BP 124/77 | HR 102 | Temp 98.5°F | Resp 18 | Ht 67.0 in | Wt 200.0 lb

## 2024-01-27 DIAGNOSIS — Z1721 Progesterone receptor positive status: Secondary | ICD-10-CM | POA: Insufficient documentation

## 2024-01-27 DIAGNOSIS — D6481 Anemia due to antineoplastic chemotherapy: Secondary | ICD-10-CM

## 2024-01-27 DIAGNOSIS — C7802 Secondary malignant neoplasm of left lung: Secondary | ICD-10-CM | POA: Insufficient documentation

## 2024-01-27 DIAGNOSIS — C55 Malignant neoplasm of uterus, part unspecified: Secondary | ICD-10-CM | POA: Diagnosis not present

## 2024-01-27 DIAGNOSIS — C549 Malignant neoplasm of corpus uteri, unspecified: Secondary | ICD-10-CM | POA: Diagnosis not present

## 2024-01-27 DIAGNOSIS — C775 Secondary and unspecified malignant neoplasm of intrapelvic lymph nodes: Secondary | ICD-10-CM | POA: Insufficient documentation

## 2024-01-27 DIAGNOSIS — Z5111 Encounter for antineoplastic chemotherapy: Secondary | ICD-10-CM | POA: Insufficient documentation

## 2024-01-27 DIAGNOSIS — C786 Secondary malignant neoplasm of retroperitoneum and peritoneum: Secondary | ICD-10-CM | POA: Diagnosis not present

## 2024-01-27 DIAGNOSIS — Z170 Estrogen receptor positive status [ER+]: Secondary | ICD-10-CM | POA: Insufficient documentation

## 2024-01-27 DIAGNOSIS — T451X5A Adverse effect of antineoplastic and immunosuppressive drugs, initial encounter: Secondary | ICD-10-CM | POA: Diagnosis not present

## 2024-01-27 DIAGNOSIS — C7801 Secondary malignant neoplasm of right lung: Secondary | ICD-10-CM | POA: Insufficient documentation

## 2024-01-27 LAB — CBC WITH DIFFERENTIAL (CANCER CENTER ONLY)
Abs Immature Granulocytes: 0.01 K/uL (ref 0.00–0.07)
Basophils Absolute: 0.1 K/uL (ref 0.0–0.1)
Basophils Relative: 1 %
Eosinophils Absolute: 1.5 K/uL — ABNORMAL HIGH (ref 0.0–0.5)
Eosinophils Relative: 17 %
HCT: 35.6 % — ABNORMAL LOW (ref 36.0–46.0)
Hemoglobin: 11.9 g/dL — ABNORMAL LOW (ref 12.0–15.0)
Immature Granulocytes: 0 %
Lymphocytes Relative: 27 %
Lymphs Abs: 2.3 K/uL (ref 0.7–4.0)
MCH: 32.8 pg (ref 26.0–34.0)
MCHC: 33.4 g/dL (ref 30.0–36.0)
MCV: 98.1 fL (ref 80.0–100.0)
Monocytes Absolute: 1.1 K/uL — ABNORMAL HIGH (ref 0.1–1.0)
Monocytes Relative: 13 %
Neutro Abs: 3.5 K/uL (ref 1.7–7.7)
Neutrophils Relative %: 42 %
Platelet Count: 322 K/uL (ref 150–400)
RBC: 3.63 MIL/uL — ABNORMAL LOW (ref 3.87–5.11)
RDW: 16.4 % — ABNORMAL HIGH (ref 11.5–15.5)
WBC Count: 8.5 K/uL (ref 4.0–10.5)
nRBC: 0 % (ref 0.0–0.2)

## 2024-01-27 LAB — CMP (CANCER CENTER ONLY)
ALT: 12 U/L (ref 0–44)
AST: 19 U/L (ref 15–41)
Albumin: 3.7 g/dL (ref 3.5–5.0)
Alkaline Phosphatase: 94 U/L (ref 38–126)
Anion gap: 5 (ref 5–15)
BUN: 16 mg/dL (ref 8–23)
CO2: 29 mmol/L (ref 22–32)
Calcium: 8.9 mg/dL (ref 8.9–10.3)
Chloride: 103 mmol/L (ref 98–111)
Creatinine: 0.63 mg/dL (ref 0.44–1.00)
GFR, Estimated: >60 mL/min
Glucose, Bld: 93 mg/dL (ref 70–99)
Potassium: 4.1 mmol/L (ref 3.5–5.1)
Sodium: 137 mmol/L (ref 135–145)
Total Bilirubin: 0.4 mg/dL (ref 0.0–1.2)
Total Protein: 6.7 g/dL (ref 6.5–8.1)

## 2024-01-27 LAB — CK: Total CK: 35 U/L — ABNORMAL LOW (ref 38–234)

## 2024-01-27 MED ORDER — SODIUM CHLORIDE 0.9% FLUSH: 10.0000 mL | INTRAVENOUS | Status: DC | PRN

## 2024-01-27 MED ORDER — PALONOSETRON HCL INJECTION 0.25 MG/5ML
0.2500 mg | Freq: Once | INTRAVENOUS | Status: AC
  Administered 2024-01-27: 0.25 mg via INTRAVENOUS
  Filled 2024-01-27: qty 5

## 2024-01-27 MED ORDER — SODIUM CHLORIDE 0.9 % IV SOLN: INTRAVENOUS | Status: DC

## 2024-01-27 MED ORDER — DEXAMETHASONE 4 MG PO TABS: ORAL_TABLET | ORAL | 5 refills | Status: DC

## 2024-01-27 MED ORDER — SODIUM CHLORIDE 0.9% FLUSH
10.0000 mL | Freq: Once | INTRAVENOUS | Status: AC
  Administered 2024-01-27: 10 mL

## 2024-01-27 MED ORDER — HEPARIN SOD (PORK) LOCK FLUSH 100 UNIT/ML IV SOLN: 500.0000 [IU] | Freq: Once | INTRAVENOUS | Status: DC | PRN

## 2024-01-27 MED ORDER — SODIUM CHLORIDE 0.9 % IV SOLN
20.0000 mg | Freq: Once | INTRAVENOUS | Status: AC
  Administered 2024-01-27: 20 mg via INTRAVENOUS
  Filled 2024-01-27: qty 20

## 2024-01-27 MED ORDER — TRABECTEDIN CHEMO INJECTION 1 MG IV
1.5000 mg/m2 | Freq: Once | INTRAVENOUS | Status: DC
  Administered 2024-01-27: 3 mg via INTRAVENOUS
  Filled 2024-01-27: qty 60

## 2024-01-27 NOTE — Telephone Encounter (Signed)
 Called her and she will start dexamethasone for 3 days after chemo. She is aware and will start tomorrow.

## 2024-01-27 NOTE — Progress Notes (Signed)
 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 High grade and LMS, 50% ER/PR positive dMMR normal, PD-L1 CPS 3% Failed doxorubicin and progressed on gemcitabine and taxotere and letrozole plus Lupron with tamoxifen  The patient has failed multiple different chemotherapy options and hormonal manipulations CT imaging from February 2025 unfortunately showed disease progression Review echocardiogram report shows stable ejection fraction We will proceed with palliative chemo with trabectedin today Side effects were reviewed with the patient and she agreed to proceed I plan to repeat imaging study after 3 cycles of treatment, around May 2025  Anemia due to antineoplastic chemotherapy This is likely due to recent treatment. The patient denies recent history of bleeding such as epistaxis, hematuria or hematochezia. She is asymptomatic from the anemia. I will observe for now.    No orders of the defined types were placed in this encounter.    Artis Delay, MD 01/27/2024 1:52 PM  INTERVAL HISTORY: Please see below for problem oriented charting. she returns to be seen prior to treatment We discussed side effects to be expected  PHYSICAL EXAMINATION: ECOG PERFORMANCE STATUS: 1 - Symptomatic but completely ambulatory  Vitals:   01/27/24 1208  BP: 124/77  Pulse: (!) 102  Resp: 18  Temp: 98.5 F (36.9 C)  SpO2: 98%   Filed Weights   01/27/24 1208  Weight: 200 lb (90.7 kg)

## 2024-01-27 NOTE — Assessment & Plan Note (Signed)
This is likely due to recent treatment. The patient denies recent history of bleeding such as epistaxis, hematuria or hematochezia. She is asymptomatic from the anemia. I will observe for now.   

## 2024-01-27 NOTE — Assessment & Plan Note (Signed)
 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

## 2024-01-27 NOTE — Patient Instructions (Signed)
 [facility] - A DEPT OF [facility]  Discharge Instructions: Thank you for choosing [facility] to provide your oncology and hematology care.   If you have a lab appointment with the Cancer Center, please go directly to the Cancer Center and check in at the registration area.   Wear comfortable clothing and clothing appropriate for easy access to any Portacath or PICC line.   We strive to give you quality time with your provider. You may need to reschedule your appointment if you arrive late (15 or more minutes).  Arriving late affects you and other patients whose appointments are after yours.  Also, if you miss three or more appointments without notifying the office, you may be dismissed from the clinic at the provider's discretion.      For prescription refill requests, have your pharmacy contact our office and allow 72 hours for refills to be completed.    Today you received the following chemotherapy and/or immunotherapy agents: Yondelis      To help prevent nausea and vomiting after your treatment, we encourage you to take your nausea medication as directed.  BELOW ARE SYMPTOMS THAT SHOULD BE REPORTED IMMEDIATELY: *FEVER GREATER THAN 100.4 F (38 C) OR HIGHER *CHILLS OR SWEATING *NAUSEA AND VOMITING THAT IS NOT CONTROLLED WITH YOUR NAUSEA MEDICATION *UNUSUAL SHORTNESS OF BREATH *UNUSUAL BRUISING OR BLEEDING *URINARY PROBLEMS (pain or burning when urinating, or frequent urination) *BOWEL PROBLEMS (unusual diarrhea, constipation, pain near the anus) TENDERNESS IN MOUTH AND THROAT WITH OR WITHOUT PRESENCE OF ULCERS (sore throat, sores in mouth, or a toothache) UNUSUAL RASH, SWELLING OR PAIN  UNUSUAL VAGINAL DISCHARGE OR ITCHING   Items with * indicate a potential emergency and should be followed up as soon as possible or go to the Emergency Department if any problems should occur.  Please show the CHEMOTHERAPY ALERT CARD or IMMUNOTHERAPY  ALERT CARD at check-in to the Emergency Department and triage nurse.  Should you have questions after your visit or need to cancel or reschedule your appointment, please contact [facility] - A DEPT [facility]  Dept: [phone]  and follow the prompts.  Office hours are 8:00 a.m. to 4:30 p.m. Monday - Friday. Please note that voicemails left after 4:00 p.m. may not be returned until the following business day.  We are closed weekends and major holidays. You have access to a nurse at all times for urgent questions. Please call the main number to the clinic Dept: [phone] and follow the prompts.   For any non-urgent questions, you may also contact your provider using MyChart. We now offer e-Visits for anyone 57 and older to request care online for non-urgent symptoms. For details visit [email or web address].   Also download the MyChart app! Go to the app store, search "MyChart", open the app, select [place], and log in with your MyChart username and password.  Trabectedin Injection What is this medication? TRABECTEDIN (tra BEK te din) treats sarcoma, a cancer that occurs in bone and connective tissues, such as fat, muscle, and blood vessels. It works by slowing down the growth of cancer cells. This medicine may be used for other purposes; ask your health care provider or pharmacist if you have questions. COMMON BRAND NAME(S): Yondelis What should I tell my care team before I take this medication? They need to know if you have any of these conditions: Heart disease Kidney disease Liver disease Low  blood cell levels, such as low white cells, platelets, or red blood cells Muscle aches or weakness An unusual or allergic reaction to trabectedin, other medications, foods, dyes, or preservatives Pregnant or trying to get pregnant Breast-feeding How should I use this medication? This medication is injected into a vein. It is given by your care team in a  hospital or clinic setting. A special MedGuide will be given to you before each treatment. Be sure to read this information carefully each time. Talk to your care team about the use of this medication in children. Special care may be needed. Overdosage: If you think you have taken too much of this medicine contact a poison control center or emergency room at once. NOTE: This medicine is only for you. Do not share this medicine with others. What if I miss a dose? Keep appointments for follow-up doses. It is important not to miss your dose. Call your care team if you are unable to keep an appointment. What may interact with this medication? Antiviral medications for HIV or AIDS Boceprevir Certain antibiotics, such as clarithromycin, telithromycin Certain medications for fungal infections, such as ketoconazole, itraconazole, posaconazole, voriconazole Certain medications for seizures, such as carbamazepine, phenobarbital, phenytoin Conivaptan Grapefruit juice Nefazodone Rifampin St. John's wort Telaprevir This list may not describe all possible interactions. Give your health care provider a list of all the medicines, herbs, non-prescription drugs, or dietary supplements you use. Also tell them if you smoke, drink alcohol, or use illegal drugs. Some items may interact with your medicine. What should I watch for while using this medication? Your condition will be monitored carefully while you are receiving this medication. This medication may make you feel generally unwell. This is not uncommon, as chemotherapy can affect healthy cells as well as cancer cells. Report any side effects. Continue your course of treatment even though you feel ill unless your care team tells you to stop. This medication can cause serious allergic reactions. To reduce your risk you may need to take medication before treatment with this medication. Take your medication as directed. This medication may increase your risk  of getting an infection. Call your care team for advice if you get a fever, chills or sore throat, or other symptoms of a cold or flu. Do not treat yourself. Try to avoid being around people who are sick. Avoid taking medications that contain aspirin, acetaminophen, ibuprofen, naproxen, or ketoprofen unless instructed by your care team. These medications may hide a fever. Be careful brushing and flossing your teeth or using a toothpick because you may get an infection or bleed more easily. If you have any dental work done, tell your dentist you are receiving this medication. Talk to your care team if you wish to become pregnant or think you might be pregnant. This medication can cause serious birth defects if taken during pregnancy or for 2 months after the last dose. A negative pregnancy test is required before starting this medication. A reliable form of contraception is recommended while taking this medication and for 2 months after the last dose. Talk to your care team about reliable forms of contraception. Do not father a child while taking this medication and for 5 months after the last dose. Use a condom during sex during this time period. Do not breast-feed while taking this medication. This medication may cause infertility. Talk to your care team if you are concerned about your fertility. What side effects may I notice from receiving this medication? Side effects that  you should report to your care team as soon as possible: Allergic reactions--skin rash, itching, hives, swelling of the face, lips, tongue, or throat Capillary leak syndrome--stomach or muscle pain, unusual weakness or fatigue, feeling faint or lightheaded, decrease in the amount of urine, swelling of the ankles, hands, or feet, trouble breathing Heart failure--shortness of breath, swelling of the ankles, feet, or hands, sudden weight gain, unusual weakness or fatigue Infection--fever, chills, cough, sore throat, wounds that don't  heal, pain or trouble when passing urine, general feeling of discomfort or being unwell Liver injury--right upper belly pain, loss of appetite, nausea, light-colored stool, dark yellow or brown urine, yellowing skin or eyes, unusual weakness or fatigue Muscle injury--unusual weakness or fatigue, muscle pain, dark yellow or brown urine, decrease in amount of urine Painful swelling, warmth, or redness of the skin, blisters or sores at the infusion site Stomach pain, unusual weakness or fatigue, nausea, vomiting, diarrhea, or fever that lasts longer than expected Unusual bruising or bleeding Side effects that usually do not require medical attention (report to your care team if they continue or are bothersome): Constipation Diarrhea Fatigue Headache Loss of appetite Nausea Vomiting This list may not describe all possible side effects. Call your doctor for medical advice about side effects. You may report side effects to FDA at 1-800-FDA-1088. Where should I keep my medication? This medication is given in a hospital or clinic. It will not be stored at home. NOTE: This sheet is a summary. It may not cover all possible information. If you have questions about this medicine, talk to your doctor, pharmacist, or health care provider.  2024 Elsevier/Gold Standard (2022-03-11 00:00:00)

## 2024-01-28 ENCOUNTER — Encounter: Payer: Self-pay | Admitting: Hematology and Oncology

## 2024-01-28 ENCOUNTER — Inpatient Hospital Stay

## 2024-01-28 VITALS — BP 135/76 | HR 100 | Temp 98.2°F | Resp 18

## 2024-01-28 DIAGNOSIS — C549 Malignant neoplasm of corpus uteri, unspecified: Secondary | ICD-10-CM

## 2024-01-28 DIAGNOSIS — Z5111 Encounter for antineoplastic chemotherapy: Secondary | ICD-10-CM | POA: Diagnosis not present

## 2024-01-28 MED ORDER — SODIUM CHLORIDE 0.9% FLUSH
10.0000 mL | INTRAVENOUS | Status: DC | PRN
  Administered 2024-01-28: 10 mL

## 2024-01-28 MED ORDER — SODIUM CHLORIDE 0.9% FLUSH: 10.0000 mL | Freq: Once | INTRAVENOUS | Status: AC

## 2024-01-28 MED ORDER — HEPARIN SOD (PORK) LOCK FLUSH 100 UNIT/ML IV SOLN: 500.0000 [IU] | Freq: Once | INTRAVENOUS | Status: AC

## 2024-01-28 MED ORDER — HEPARIN SOD (PORK) LOCK FLUSH 100 UNIT/ML IV SOLN
500.0000 [IU] | Freq: Once | INTRAVENOUS | Status: AC | PRN
  Administered 2024-01-28: 500 [IU]

## 2024-01-28 NOTE — Telephone Encounter (Signed)
-----   Message from Nurse Lorne Skeens sent at 01/27/2024  3:15 PM EDT ----- Regarding: First Time/yondelis/ Dr. Bertis Ruddy pt Pt had first time Yondelis today. Thank you!

## 2024-01-28 NOTE — Progress Notes (Signed)
 Pt here for pump D/c with 13.5 mL of chemo left. Dr. Bertis Ruddy notified and said it is okay to speed up. Corrie Dandy, RN gave bolus of residual.

## 2024-01-28 NOTE — Telephone Encounter (Signed)
 Called pt to see how she has done so far with her recent new treatment.  She reports doing well & no problems with her pump.  She took her steroid this am & will repeat tomorrow. She did wake up during the night & had trouble getting back to sleep.  Discussed options & she stated that Dr Bertis Ruddy said she could take tylenol PM if need.  Informed that if she doesn't need the tylenol she could take benadryl alone. She knows how to reach [place] if needed & knows her next appts.

## 2024-01-31 ENCOUNTER — Encounter: Payer: Self-pay | Admitting: Hematology and Oncology

## 2024-02-02 ENCOUNTER — Telehealth: Payer: Self-pay

## 2024-02-02 NOTE — Telephone Encounter (Signed)
 Error - duplicate

## 2024-02-02 NOTE — Telephone Encounter (Signed)
 Pt called and asked if Dr Bertis Ruddy is OK with her traveling to and from [facility] by car to help her son move. Pt's first tx on new regimen was 3/13. She states the plan would be to leave tomorrow night and drive then and Friday, stay Saturday then begin driving back Saturday night and Sunday.  MD states for pt to be careful since this is her first tx on new chemo. Advised pt this is a great deal of travelling , so she decided she will not go.

## 2024-02-04 ENCOUNTER — Other Ambulatory Visit: Payer: Self-pay | Admitting: *Deleted

## 2024-02-16 MED FILL — Dexamethasone Sodium Phosphate Inj 100 MG/10ML: INTRAMUSCULAR | Qty: 2 | Status: AC

## 2024-02-17 ENCOUNTER — Inpatient Hospital Stay

## 2024-02-17 ENCOUNTER — Inpatient Hospital Stay: Admitting: Hematology and Oncology

## 2024-02-17 ENCOUNTER — Inpatient Hospital Stay: Attending: Gynecologic Oncology

## 2024-02-17 ENCOUNTER — Telehealth: Payer: Self-pay

## 2024-02-17 VITALS — BP 129/75 | HR 94 | Temp 98.0°F | Resp 17 | Ht 67.0 in | Wt 199.4 lb

## 2024-02-17 DIAGNOSIS — Z5111 Encounter for antineoplastic chemotherapy: Secondary | ICD-10-CM | POA: Insufficient documentation

## 2024-02-17 DIAGNOSIS — Z1721 Progesterone receptor positive status: Secondary | ICD-10-CM | POA: Diagnosis not present

## 2024-02-17 DIAGNOSIS — C7801 Secondary malignant neoplasm of right lung: Secondary | ICD-10-CM | POA: Diagnosis not present

## 2024-02-17 DIAGNOSIS — D72819 Decreased white blood cell count, unspecified: Secondary | ICD-10-CM | POA: Insufficient documentation

## 2024-02-17 DIAGNOSIS — C7802 Secondary malignant neoplasm of left lung: Secondary | ICD-10-CM | POA: Diagnosis not present

## 2024-02-17 DIAGNOSIS — Z170 Estrogen receptor positive status [ER+]: Secondary | ICD-10-CM | POA: Diagnosis not present

## 2024-02-17 DIAGNOSIS — C55 Malignant neoplasm of uterus, part unspecified: Secondary | ICD-10-CM | POA: Diagnosis not present

## 2024-02-17 DIAGNOSIS — C549 Malignant neoplasm of corpus uteri, unspecified: Secondary | ICD-10-CM

## 2024-02-17 DIAGNOSIS — C778 Secondary and unspecified malignant neoplasm of lymph nodes of multiple regions: Secondary | ICD-10-CM | POA: Insufficient documentation

## 2024-02-17 DIAGNOSIS — K5909 Other constipation: Secondary | ICD-10-CM | POA: Diagnosis not present

## 2024-02-17 DIAGNOSIS — T451X5A Adverse effect of antineoplastic and immunosuppressive drugs, initial encounter: Secondary | ICD-10-CM

## 2024-02-17 DIAGNOSIS — D701 Agranulocytosis secondary to cancer chemotherapy: Secondary | ICD-10-CM | POA: Insufficient documentation

## 2024-02-17 DIAGNOSIS — R110 Nausea: Secondary | ICD-10-CM | POA: Insufficient documentation

## 2024-02-17 LAB — CMP (CANCER CENTER ONLY)
ALT: 12 U/L (ref 0–44)
AST: 17 U/L (ref 15–41)
Albumin: 3.6 g/dL (ref 3.5–5.0)
Alkaline Phosphatase: 117 U/L (ref 38–126)
Anion gap: 7 (ref 5–15)
BUN: 11 mg/dL (ref 8–23)
CO2: 27 mmol/L (ref 22–32)
Calcium: 9.4 mg/dL (ref 8.9–10.3)
Chloride: 105 mmol/L (ref 98–111)
Creatinine: 0.62 mg/dL (ref 0.44–1.00)
GFR, Estimated: >60 mL/min (ref >60)
Glucose, Bld: 104 mg/dL — ABNORMAL HIGH (ref 70–99)
Potassium: 4 mmol/L (ref 3.5–5.1)
Sodium: 139 mmol/L (ref 135–145)
Total Bilirubin: 0.3 mg/dL (ref 0.0–1.2)
Total Protein: 6.5 g/dL (ref 6.5–8.1)

## 2024-02-17 LAB — CBC WITH DIFFERENTIAL (CANCER CENTER ONLY)
Abs Immature Granulocytes: 0.01 10*3/uL (ref 0.00–0.07)
Basophils Absolute: 0.1 10*3/uL (ref 0.0–0.1)
Basophils Relative: 1 %
Eosinophils Absolute: 0.3 10*3/uL (ref 0.0–0.5)
Eosinophils Relative: 9 %
HCT: 35.3 % — ABNORMAL LOW (ref 36.0–46.0)
Hemoglobin: 12.1 g/dL (ref 12.0–15.0)
Immature Granulocytes: 0 %
Lymphocytes Relative: 41 %
Lymphs Abs: 1.6 10*3/uL (ref 0.7–4.0)
MCH: 32.4 pg (ref 26.0–34.0)
MCHC: 34.3 g/dL (ref 30.0–36.0)
MCV: 94.6 fL (ref 80.0–100.0)
Monocytes Absolute: 0.5 10*3/uL (ref 0.1–1.0)
Monocytes Relative: 15 %
Neutro Abs: 1.3 10*3/uL — ABNORMAL LOW (ref 1.7–7.7)
Neutrophils Relative %: 34 %
Platelet Count: 312 10*3/uL (ref 150–400)
RBC: 3.73 MIL/uL — ABNORMAL LOW (ref 3.87–5.11)
RDW: 15.2 % (ref 11.5–15.5)
WBC Count: 3.7 10*3/uL — ABNORMAL LOW (ref 4.0–10.5)
nRBC: 0 % (ref 0.0–0.2)

## 2024-02-17 LAB — CK: Total CK: 25 U/L — ABNORMAL LOW (ref 38–234)

## 2024-02-17 MED ORDER — SODIUM CHLORIDE 0.9% FLUSH
10.0000 mL | Freq: Once | INTRAVENOUS | Status: AC
  Administered 2024-02-17: 10 mL

## 2024-02-17 MED ORDER — LIDOCAINE-PRILOCAINE 2.5-2.5 % EX CREA: TOPICAL_CREAM | CUTANEOUS | 3 refills | Status: DC

## 2024-02-17 MED ORDER — PALONOSETRON HCL INJECTION 0.25 MG/5ML
0.2500 mg | Freq: Once | INTRAVENOUS | Status: AC
  Administered 2024-02-17: 0.25 mg via INTRAVENOUS
  Filled 2024-02-17: qty 5

## 2024-02-17 MED ORDER — HEPARIN SOD (PORK) LOCK FLUSH 100 UNIT/ML IV SOLN: 500.0000 [IU] | Freq: Once | INTRAVENOUS | Status: DC | PRN

## 2024-02-17 MED ORDER — SODIUM CHLORIDE 0.9% FLUSH
10.0000 mL | INTRAVENOUS | Status: DC | PRN
Start: 2024-02-17 — End: 2024-02-17

## 2024-02-17 MED ORDER — SODIUM CHLORIDE 0.9 % IV SOLN
1.5000 mg/m2 | Freq: Once | INTRAVENOUS | Status: DC
  Administered 2024-02-17: 3 mg via INTRAVENOUS
  Filled 2024-02-17: qty 60

## 2024-02-17 MED ORDER — SODIUM CHLORIDE 0.9 % IV SOLN
20.0000 mg | Freq: Once | INTRAVENOUS | Status: AC
  Administered 2024-02-17: 20 mg via INTRAVENOUS
  Filled 2024-02-17: qty 20

## 2024-02-17 MED ORDER — SODIUM CHLORIDE 0.9 % IV SOLN: INTRAVENOUS | Status: DC

## 2024-02-17 NOTE — Progress Notes (Signed)
 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 The patient denies recent history of fevers, cough, chills, diarrhea or dysuria. She is asymptomatic from the leukopenia. I will observe for now.  I will continue the chemotherapy at current dose without dosage adjustment.  If the leukopenia gets progressive worse in the future, I might have to delay her treatment or adjust the chemotherapy dose.  Nausea without vomiting She will continue antiemetics as needed Other constipation She will continue laxatives as needed  No orders of the defined types were placed in this encounter.    Artis Delay, MD  INTERVAL HISTORY: she returns for treatment follow-up Complications related to previous cycle of chemotherapy included leukopenia,, nausea with/without vomiting,, and constipation,  PHYSICAL EXAMINATION: ECOG PERFORMANCE STATUS: 0 - Asymptomatic  Vitals:   02/17/24 0840  BP: 129/75  Pulse: 94  Resp: 17  Temp: 98 F (36.7 C)  SpO2: 98%   Filed Weights    02/17/24 0840  Weight: 199 lb 6 oz (90.4 kg)    Relevant data reviewed during this visit included CBC and CMP

## 2024-02-17 NOTE — Assessment & Plan Note (Addendum)
She will continue laxatives as needed

## 2024-02-17 NOTE — Patient Instructions (Signed)
 [facility] - A DEPT OF [facility]  Discharge Instructions: Thank you for choosing [facility] to provide your oncology and hematology care.   If you have a lab appointment with the Cancer Center, please go directly to the Cancer Center and check in at the registration area.   Wear comfortable clothing and clothing appropriate for easy access to any Portacath or PICC line.   We strive to give you quality time with your provider. You may need to reschedule your appointment if you arrive late (15 or more minutes).  Arriving late affects you and other patients whose appointments are after yours.  Also, if you miss three or more appointments without notifying the office, you may be dismissed from the clinic at the provider's discretion.      For prescription refill requests, have your pharmacy contact our office and allow 72 hours for refills to be completed.    Today you received the following chemotherapy and/or immunotherapy agents: Trabectedin.       To help prevent nausea and vomiting after your treatment, we encourage you to take your nausea medication as directed.  BELOW ARE SYMPTOMS THAT SHOULD BE REPORTED IMMEDIATELY: *FEVER GREATER THAN 100.4 F (38 C) OR HIGHER *CHILLS OR SWEATING *NAUSEA AND VOMITING THAT IS NOT CONTROLLED WITH YOUR NAUSEA MEDICATION *UNUSUAL SHORTNESS OF BREATH *UNUSUAL BRUISING OR BLEEDING *URINARY PROBLEMS (pain or burning when urinating, or frequent urination) *BOWEL PROBLEMS (unusual diarrhea, constipation, pain near the anus) TENDERNESS IN MOUTH AND THROAT WITH OR WITHOUT PRESENCE OF ULCERS (sore throat, sores in mouth, or a toothache) UNUSUAL RASH, SWELLING OR PAIN  UNUSUAL VAGINAL DISCHARGE OR ITCHING   Items with * indicate a potential emergency and should be followed up as soon as possible or go to the Emergency Department if any problems should occur.  Please show the CHEMOTHERAPY ALERT CARD or  IMMUNOTHERAPY ALERT CARD at check-in to the Emergency Department and triage nurse.  Should you have questions after your visit or need to cancel or reschedule your appointment, please contact [facility] - A DEPT OF [facility]  Dept: [phone]  and follow the prompts.  Office hours are 8:00 a.m. to 4:30 p.m. Monday - Friday. Please note that voicemails left after 4:00 p.m. may not be returned until the following business day.  We are closed weekends and major holidays. You have access to a nurse at all times for urgent questions. Please call the main number to the clinic Dept: [phone] and follow the prompts.   For any non-urgent questions, you may also contact your provider using MyChart. We now offer e-Visits for anyone 73 and older to request care online for non-urgent symptoms. For details visit [email or web address].   Also download the MyChart app! Go to the app store, search "MyChart", open the app, select , and log in with your MyChart username and password.

## 2024-02-17 NOTE — Assessment & Plan Note (Addendum)
She will continue antiemetics as needed

## 2024-02-17 NOTE — Telephone Encounter (Signed)
 Called her per her request to change pump stop appts to AP. She is aware of appts.

## 2024-02-17 NOTE — Assessment & Plan Note (Addendum)
 This is likely due to recent treatment. The patient denies recent history of fevers, cough, chills, diarrhea or dysuria. She is asymptomatic from the leukopenia. I will observe for now.  I will continue the chemotherapy at current dose without dosage adjustment.  If the leukopenia gets progressive worse in the future, I might have to delay her treatment or adjust the chemotherapy dose.

## 2024-02-17 NOTE — Assessment & Plan Note (Addendum)
 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

## 2024-02-18 ENCOUNTER — Inpatient Hospital Stay

## 2024-02-18 ENCOUNTER — Other Ambulatory Visit: Payer: Self-pay

## 2024-02-18 VITALS — BP 130/82 | HR 101 | Temp 98.5°F | Resp 20

## 2024-02-18 DIAGNOSIS — C549 Malignant neoplasm of corpus uteri, unspecified: Secondary | ICD-10-CM

## 2024-02-18 DIAGNOSIS — Z5111 Encounter for antineoplastic chemotherapy: Secondary | ICD-10-CM | POA: Diagnosis not present

## 2024-02-18 MED ORDER — SODIUM CHLORIDE 0.9% FLUSH
10.0000 mL | INTRAVENOUS | Status: DC | PRN
Start: 2024-02-18 — End: 2024-02-18
  Administered 2024-02-18: 10 mL

## 2024-02-18 MED ORDER — HEPARIN SOD (PORK) LOCK FLUSH 100 UNIT/ML IV SOLN
500.0000 [IU] | Freq: Once | INTRAVENOUS | Status: AC | PRN
  Administered 2024-02-18: 500 [IU]

## 2024-03-08 ENCOUNTER — Encounter: Payer: Self-pay | Admitting: Hematology and Oncology

## 2024-03-08 MED FILL — Dexamethasone Sodium Phosphate Inj 100 MG/10ML: INTRAMUSCULAR | Qty: 2 | Status: AC

## 2024-03-09 ENCOUNTER — Inpatient Hospital Stay: Admitting: Hematology and Oncology

## 2024-03-09 ENCOUNTER — Telehealth: Payer: Self-pay

## 2024-03-09 ENCOUNTER — Inpatient Hospital Stay

## 2024-03-09 ENCOUNTER — Encounter: Payer: Self-pay | Admitting: Hematology and Oncology

## 2024-03-09 VITALS — BP 132/77 | HR 101 | Temp 98.1°F | Resp 17 | Wt 199.4 lb

## 2024-03-09 VITALS — HR 97

## 2024-03-09 DIAGNOSIS — C549 Malignant neoplasm of corpus uteri, unspecified: Secondary | ICD-10-CM

## 2024-03-09 DIAGNOSIS — M8580 Other specified disorders of bone density and structure, unspecified site: Secondary | ICD-10-CM

## 2024-03-09 DIAGNOSIS — R600 Localized edema: Secondary | ICD-10-CM | POA: Diagnosis not present

## 2024-03-09 DIAGNOSIS — T451X5A Adverse effect of antineoplastic and immunosuppressive drugs, initial encounter: Secondary | ICD-10-CM | POA: Diagnosis not present

## 2024-03-09 DIAGNOSIS — Z5111 Encounter for antineoplastic chemotherapy: Secondary | ICD-10-CM | POA: Diagnosis not present

## 2024-03-09 DIAGNOSIS — D6481 Anemia due to antineoplastic chemotherapy: Secondary | ICD-10-CM

## 2024-03-09 LAB — CBC WITH DIFFERENTIAL (CANCER CENTER ONLY)
Abs Immature Granulocytes: 0.02 10*3/uL (ref 0.00–0.07)
Basophils Absolute: 0 10*3/uL (ref 0.0–0.1)
Basophils Relative: 1 %
Eosinophils Absolute: 0.2 10*3/uL (ref 0.0–0.5)
Eosinophils Relative: 4 %
HCT: 33.4 % — ABNORMAL LOW (ref 36.0–46.0)
Hemoglobin: 11.5 g/dL — ABNORMAL LOW (ref 12.0–15.0)
Immature Granulocytes: 1 %
Lymphocytes Relative: 38 %
Lymphs Abs: 1.6 10*3/uL (ref 0.7–4.0)
MCH: 32.6 pg (ref 26.0–34.0)
MCHC: 34.4 g/dL (ref 30.0–36.0)
MCV: 94.6 fL (ref 80.0–100.0)
Monocytes Absolute: 0.7 10*3/uL (ref 0.1–1.0)
Monocytes Relative: 16 %
Neutro Abs: 1.7 10*3/uL (ref 1.7–7.7)
Neutrophils Relative %: 40 %
Platelet Count: 350 10*3/uL (ref 150–400)
RBC: 3.53 MIL/uL — ABNORMAL LOW (ref 3.87–5.11)
RDW: 15.8 % — ABNORMAL HIGH (ref 11.5–15.5)
Smear Review: NORMAL
WBC Count: 4.2 10*3/uL (ref 4.0–10.5)
nRBC: 0 % (ref 0.0–0.2)

## 2024-03-09 LAB — CMP (CANCER CENTER ONLY)
ALT: 13 U/L (ref 0–44)
AST: 19 U/L (ref 15–41)
Albumin: 3.6 g/dL (ref 3.5–5.0)
Alkaline Phosphatase: 111 U/L (ref 38–126)
Anion gap: 4 — ABNORMAL LOW (ref 5–15)
BUN: 9 mg/dL (ref 8–23)
CO2: 32 mmol/L (ref 22–32)
Calcium: 9.4 mg/dL (ref 8.9–10.3)
Chloride: 103 mmol/L (ref 98–111)
Creatinine: 0.65 mg/dL (ref 0.44–1.00)
GFR, Estimated: >60 mL/min (ref >60)
Glucose, Bld: 109 mg/dL — ABNORMAL HIGH (ref 70–99)
Potassium: 4.3 mmol/L (ref 3.5–5.1)
Sodium: 139 mmol/L (ref 135–145)
Total Bilirubin: 0.4 mg/dL (ref 0.0–1.2)
Total Protein: 6.4 g/dL — ABNORMAL LOW (ref 6.5–8.1)

## 2024-03-09 LAB — CK: Total CK: 30 U/L — ABNORMAL LOW (ref 38–234)

## 2024-03-09 MED ORDER — SODIUM CHLORIDE 0.9% FLUSH
10.0000 mL | Freq: Once | INTRAVENOUS | Status: AC
  Administered 2024-03-09: 10 mL

## 2024-03-09 MED ORDER — PALONOSETRON HCL INJECTION 0.25 MG/5ML
0.2500 mg | Freq: Once | INTRAVENOUS | Status: AC
  Administered 2024-03-09: 0.25 mg via INTRAVENOUS
  Filled 2024-03-09: qty 5

## 2024-03-09 MED ORDER — ALTEPLASE 2 MG IJ SOLR
2.0000 mg | Freq: Once | INTRAMUSCULAR | Status: AC
  Administered 2024-03-09: 2 mg
  Filled 2024-03-09: qty 2

## 2024-03-09 MED ORDER — SODIUM CHLORIDE 0.9 % IV SOLN
INTRAVENOUS | Status: DC
Start: 2024-03-09 — End: 2024-03-09

## 2024-03-09 MED ORDER — TRABECTEDIN CHEMO INJECTION 1 MG IV
1.5000 mg/m2 | Freq: Once | INTRAVENOUS | Status: DC
  Administered 2024-03-09: 3 mg via INTRAVENOUS
  Filled 2024-03-09: qty 60

## 2024-03-09 MED ORDER — HEPARIN SOD (PORK) LOCK FLUSH 100 UNIT/ML IV SOLN
500.0000 [IU] | Freq: Once | INTRAVENOUS | Status: DC | PRN
Start: 2024-03-09 — End: 2024-03-09

## 2024-03-09 MED ORDER — SODIUM CHLORIDE 0.9 % IV SOLN
20.0000 mg | Freq: Once | INTRAVENOUS | Status: AC
  Administered 2024-03-09: 20 mg via INTRAVENOUS
  Filled 2024-03-09: qty 20

## 2024-03-09 MED ORDER — SODIUM CHLORIDE 0.9% FLUSH: 10.0000 mL | INTRAVENOUS | Status: DC | PRN

## 2024-03-09 NOTE — Assessment & Plan Note (Addendum)
This is likely due to recent treatment. The patient denies recent history of bleeding such as epistaxis, hematuria or hematochezia. She is asymptomatic from the anemia. I will observe for now.   

## 2024-03-09 NOTE — Assessment & Plan Note (Addendum)
 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

## 2024-03-09 NOTE — Telephone Encounter (Signed)
 Called and given appt at [facility] imaging on 5/8 at 1240, arrive at 1220, no solid food for 4 hours prior. She verbalized understanding.

## 2024-03-09 NOTE — Assessment & Plan Note (Addendum)
 She is prone to get bilateral lower extremity edema with steroids and Taxotere She has furosemide to take as needed

## 2024-03-09 NOTE — Progress Notes (Signed)
 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 The patient denies recent history of bleeding such as epistaxis, hematuria or hematochezia. She is asymptomatic from the anemia. I will observe for now.   Bilateral leg edema She is prone to get bilateral lower extremity edema with steroids and Taxotere  She has furosemide  to take as needed  Orders Placed This Encounter  Procedures   CT CHEST ABDOMEN PELVIS W CONTRAST    [employer]/mcr epic WT 199 No Diabetes No allergies to contrast dye No kidney cancer/kidney surgery/yes both kidneys No special needs/yes port/ attached) If you need to cancel your appt, please do so 24 hrs prior to your appointment to avoid getting charged a no show fee of $75 PT is aware/PT verbal understood instructions given Dr's office said NO ORAL-Pansy Pansy with [facility] @ [phone] Janaki aware of port-pansy    Standing Status:   Future    Expected Date:   03/23/2024    Expiration Date:   03/09/2025    If indicated for the ordered procedure, I authorize the  administration of contrast media per Radiology protocol:   Yes    Does the patient have a contrast media/X-ray dye allergy?:   No    Preferred imaging location?:   [facility]    If indicated for the ordered procedure, I authorize the administration of oral contrast media per Radiology protocol:   No    Reason for no oral contrast::   no need oral contrast     Almeda Jacobs, MD  INTERVAL HISTORY: she returns for treatment follow-up Complications related to previous cycle of chemotherapy included anemia,, nausea with/without vomiting,, fatigue,, and leg edema  PHYSICAL EXAMINATION: ECOG PERFORMANCE STATUS: 1 - Symptomatic but completely ambulatory  Vitals:   03/09/24 0902  BP: 132/77  Pulse: (!) 101  Resp: 17  Temp: 98.1 F (36.7 C)  SpO2: 98%   Filed Weights   03/09/24 0902  Weight: 199 lb 6.4 oz (90.4 kg)    Relevant data reviewed during this visit included CBC and CMP

## 2024-03-10 ENCOUNTER — Inpatient Hospital Stay

## 2024-03-10 ENCOUNTER — Encounter

## 2024-03-10 VITALS — BP 130/72 | HR 106 | Temp 98.3°F | Resp 19

## 2024-03-10 DIAGNOSIS — C549 Malignant neoplasm of corpus uteri, unspecified: Secondary | ICD-10-CM

## 2024-03-10 DIAGNOSIS — Z5111 Encounter for antineoplastic chemotherapy: Secondary | ICD-10-CM | POA: Diagnosis not present

## 2024-03-10 MED ORDER — SODIUM CHLORIDE 0.9% FLUSH
10.0000 mL | INTRAVENOUS | Status: DC | PRN
Start: 2024-03-10 — End: 2024-03-10
  Administered 2024-03-10: 10 mL

## 2024-03-10 MED ORDER — HEPARIN SOD (PORK) LOCK FLUSH 100 UNIT/ML IV SOLN
500.0000 [IU] | Freq: Once | INTRAVENOUS | Status: AC | PRN
  Administered 2024-03-10: 500 [IU]

## 2024-03-10 NOTE — Progress Notes (Signed)
Patients port flushed without difficulty.  Good blood return noted with no bruising or swelling noted at site.  Band aid applied.  Home infusion 5FU pump disconnected.  VSS with discharge and left in satisfactory condition with no s/s of distress noted.   

## 2024-03-10 NOTE — Patient Instructions (Signed)

## 2024-03-19 ENCOUNTER — Other Ambulatory Visit: Payer: Self-pay | Admitting: Hematology and Oncology

## 2024-03-23 ENCOUNTER — Ambulatory Visit
Admission: RE | Admit: 2024-03-23 | Discharge: 2024-03-23 | Disposition: A | Discharge status: Home / Self Care | Source: Ambulatory Visit | Attending: Hematology and Oncology | Admitting: Hematology and Oncology

## 2024-03-23 DIAGNOSIS — C549 Malignant neoplasm of corpus uteri, unspecified: Secondary | ICD-10-CM

## 2024-03-23 MED ORDER — IOPAMIDOL (ISOVUE-300) INJECTION 61%
100.0000 mL | Freq: Once | INTRAVENOUS | Status: AC | PRN
  Administered 2024-03-23: 100 mL via INTRAVENOUS

## 2024-03-24 ENCOUNTER — Other Ambulatory Visit: Payer: Self-pay | Admitting: Hematology and Oncology

## 2024-03-24 ENCOUNTER — Telehealth: Payer: Self-pay

## 2024-03-24 NOTE — Assessment & Plan Note (Addendum)
 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%, PDL-1 5%, p53 mutated, BRAF mutation not detected, SI stable, Low TMB, BRCA neg, Her2/neu neg, RET/NTRK not detected, ESR1 not detected Failed doxorubicin , progressed on gemcitabine  & taxotere , letrozole, Lupron  with tamoxifen  and trabectedin   I reviewed CT imaging from May 2025 with the patient Unfortunately, she has disease progression on trabectedin  I reviewed molecular profile with the patient and prior treatment that she has received She has progressed on 5 different chemotherapy regimen as well as entire estrogen treatment with letrozole as well as Lupron  with tamoxifen  Moving forward, the benefit for future additional treatment is low Other options would include pazopanib, ifosfamide or eribulin We also discussed the role of immunotherapy.  PD-L1 testing only came back positive at 5% We also discussed referral to tertiary center for potential clinical trial or second opinion

## 2024-03-24 NOTE — Telephone Encounter (Signed)
 Called per Dr. Marton Sleeper and offered earlier appt. Appt scheduled for Monday 5/12. She is aware of appt. 5/15 appts canceled and she is aware.

## 2024-03-27 ENCOUNTER — Encounter: Payer: Self-pay | Admitting: Hematology and Oncology

## 2024-03-27 ENCOUNTER — Inpatient Hospital Stay: Attending: Gynecologic Oncology | Admitting: Hematology and Oncology

## 2024-03-27 VITALS — BP 161/90 | HR 127 | Temp 99.8°F | Resp 18 | Ht 67.0 in | Wt 204.2 lb

## 2024-03-27 DIAGNOSIS — C786 Secondary malignant neoplasm of retroperitoneum and peritoneum: Secondary | ICD-10-CM | POA: Diagnosis not present

## 2024-03-27 DIAGNOSIS — C7801 Secondary malignant neoplasm of right lung: Secondary | ICD-10-CM | POA: Diagnosis not present

## 2024-03-27 DIAGNOSIS — C549 Malignant neoplasm of corpus uteri, unspecified: Secondary | ICD-10-CM | POA: Diagnosis not present

## 2024-03-27 DIAGNOSIS — Z7189 Other specified counseling: Secondary | ICD-10-CM | POA: Diagnosis not present

## 2024-03-27 DIAGNOSIS — C7802 Secondary malignant neoplasm of left lung: Secondary | ICD-10-CM | POA: Insufficient documentation

## 2024-03-27 DIAGNOSIS — C775 Secondary and unspecified malignant neoplasm of intrapelvic lymph nodes: Secondary | ICD-10-CM | POA: Insufficient documentation

## 2024-03-27 DIAGNOSIS — Z9221 Personal history of antineoplastic chemotherapy: Secondary | ICD-10-CM | POA: Diagnosis not present

## 2024-03-27 DIAGNOSIS — C55 Malignant neoplasm of uterus, part unspecified: Secondary | ICD-10-CM | POA: Insufficient documentation

## 2024-03-27 NOTE — Assessment & Plan Note (Addendum)
 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 Treatment offered is palliative in nature. We discussed a realistic and effective plan that will be reviewed and updated on an ongoing basis as indicated. We discussed the following: Risks, benefits and alternatives to the various treatment options Discussed patient's personal belief/values/goals Discussed palliative care options, ways to avoid hospitalization and patient's desire for care if decision making capacity is affected Discussed MOST form, Advanced Directive and Code Status We have up-to-date advance care planning on file The patient has expressed desire for DO NOT RESUSCITATE in the event of terminal illness

## 2024-03-27 NOTE — Progress Notes (Signed)
  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%, PDL-1 5%, p53 mutated, BRAF mutation not detected, SI stable, Low TMB, BRCA neg, Her2/neu neg, RET/NTRK not detected, ESR1 not detected Failed doxorubicin , progressed on gemcitabine  & taxotere , letrozole, Lupron  with tamoxifen  and trabectedin   I reviewed CT imaging from May 2025 with the patient Unfortunately, she has disease progression on trabectedin  I reviewed molecular profile with the patient and prior treatment that she has received She has progressed on 5 different chemotherapy regimen as well as anti-estrogen treatment with letrozole as well as Lupron  with tamoxifen  Moving forward, the benefit for future additional treatment is low Other options would include pazopanib, ifosfamide or pembrolizumab We also discussed the role of immunotherapy.  PD-L1 testing only came back positive at 5% We also discussed referral to tertiary center for potential clinical trial or second opinion We also discussed possibility of CT-guided biopsy of metastatic disease to look for other molecular targets to guide future treatment Ultimately, she is undecided She would like to go home and think about it We also discussed the role of palliative care only to focus on quality of life She will get back to me with her final decision in 2 days Goals of care, counseling/discussion Patient is aware she has stage IV disease and disease is incurable. Treatment offered is palliative in nature. We discussed a realistic  and effective plan that will be reviewed and updated on an ongoing basis as indicated. We discussed the following: Risks, benefits and alternatives to the various treatment options Discussed patient's personal belief/values/goals Discussed palliative care options, ways to avoid hospitalization and patient's desire for care if decision making capacity is affected Discussed MOST form, Advanced Directive and Code Status We have up-to-date advance care planning on file The patient has expressed desire for DO NOT RESUSCITATE in the event of terminal illness  No orders of the defined types were placed in this encounter.    Almeda Jacobs, MD  INTERVAL HISTORY: she returns for surveillance follow-up and review of CT imaging results She is here accompanied by her husband She tolerated last cycle therapy well We reviewed multiple imaging studies I compare most recent imaging from May 2025 with imaging studies from February 2025 and November 2024 We reviewed current guidelines and discussed various treatment options and side effects to be expected  PHYSICAL EXAMINATION: ECOG PERFORMANCE STATUS: 1 - Symptomatic but completely ambulatory  Vitals:   03/27/24 1505  BP: (!) 161/90  Pulse: (!) 127  Resp: 18  Temp: 99.8 F (37.7 C)  SpO2: 98%   Filed Weights   03/27/24 1505  Weight: 204 lb 3.2 oz (92.6 kg)    Relevant data reviewed during this visit included CT imaging from November 2024, February 2025 and May 2025

## 2024-03-29 ENCOUNTER — Telehealth: Payer: Self-pay

## 2024-03-29 NOTE — Telephone Encounter (Signed)
 Pt called and states she is having a difficult time making a decision about which treatment to go with. She is asking for Dr Marton Sleeper medical opinion to help make her decision. Message forwarded to MD.

## 2024-03-30 ENCOUNTER — Encounter: Payer: Self-pay | Admitting: Hematology and Oncology

## 2024-03-30 ENCOUNTER — Ambulatory Visit

## 2024-03-30 ENCOUNTER — Ambulatory Visit: Admitting: Hematology and Oncology

## 2024-03-30 ENCOUNTER — Other Ambulatory Visit

## 2024-03-30 NOTE — Telephone Encounter (Signed)
 Called back and scheduled a phone visit tomorrow at [phone] tomorrow. She is aware of appt.

## 2024-03-30 NOTE — Telephone Encounter (Signed)
 Leah Diaz,  Can you call her back and ask if she needs another visit I cannot make decision for her

## 2024-03-31 ENCOUNTER — Encounter: Payer: Self-pay | Admitting: Hematology and Oncology

## 2024-03-31 ENCOUNTER — Inpatient Hospital Stay

## 2024-03-31 ENCOUNTER — Inpatient Hospital Stay (HOSPITAL_BASED_OUTPATIENT_CLINIC_OR_DEPARTMENT_OTHER): Admitting: Hematology and Oncology

## 2024-03-31 ENCOUNTER — Encounter

## 2024-03-31 DIAGNOSIS — C549 Malignant neoplasm of corpus uteri, unspecified: Secondary | ICD-10-CM | POA: Diagnosis not present

## 2024-03-31 NOTE — Progress Notes (Signed)
 HEMATOLOGY-ONCOLOGY ELECTRONIC VISIT PROGRESS NOTE  Patient Care Team: Almeda Jacobs, MD as PCP - General (Hematology and Oncology) Lasalle Pointer, MD as PCP - Cardiology (Cardiology) Suzi Essex, MD as Consulting Physician (Gynecologic Oncology) Neda Balk, RN as Registered Nurse  I connected with the patient via telephone conference and verified that I am speaking with the correct person using two identifiers. The patient's location is at home and I am providing care from the [facility] I discussed the limitations, risks, security and privacy concerns of performing an evaluation and management service by e-visits and the availability of in person appointments.  I also discussed with the patient that there may be a patient responsible charge related to this service. The patient expressed understanding and agreed to proceed.   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   INTERVAL HISTORY: Please see below for problem oriented charting. The purpose of today's discussion is to review plan of care She is undecided whether she wants to pursue pazopanib versus pembrolizumab and has questions about side effects of each medication I addressed her questions to her satisfaction  today  SUMMARY OF ONCOLOGIC HISTORY: Oncology History Overview Note  High grade and LMS, 50% ER/PR positive dMMR normal, PD-L1 CPS 3% CARIS: ER60%, PR50%, PDL-1 5%, p53 mutated, BRAF mutation not detected, SI stable, Low TMB, BRCA neg, Her2/neu neg, RET/NTRK not detected, ESR1 not detected Failed doxorubicin , progressed on gemcitabine  & taxotere , letrozole, Lupron  with tamoxifen  and trabectedin    Uterine sarcoma (HCC)  06/02/2021 Imaging   MRI pelvis Heterogeneous 9.0 cm intrauterine mass within the intramural/submucosal anterior fundus demonstrating interval growth, internal enhancement, and restricted diffusion 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 No extra uterine extension. No evidence of metastatic disease within the pelvis.   Mild thickening of the endometrial stripe, likely related to entrapment by the adjacent uterine mass   06/03/2021 Initial Diagnosis   Uterine sarcoma (HCC)   06/03/2021 Cancer Staging   Staging form: Corpus Uteri - Sarcoma, AJCC 7th Edition - Clinical stage from 06/03/2021: FIGO Stage IVB (rT1b, N0, M1) - Signed by Almeda Jacobs, MD on 03/31/2022 Diagnostic confirmation: Positive histology Stage prefix: Recurrence Biopsy of metastatic site performed: No Lymph-vascular invasion (LVI): LVI present/identified, NOS   06/03/2021 Pathology Results   FINAL MICROSCOPIC DIAGNOSIS:   A. UTERUS, CERVIX, BILATERAL FALLOPIAN TUBES AND OVARIES, HYSTERECTOMY:  - Uterus:       Mixed high grade uterine sarcoma, spanning 6 cm, see comment.       Extensive lymphovascular invasion.       See oncology table.  - Cervix: Benign squamous and endocervical mucosa. No dysplasia or  malignancy.  - Bilateral ovaries: Unremarkable. No malignancy.  - Bilateral fallopian tubes: Unremarkable. No malignancy.   ONCOLOGY TABLE:   UTERUS, SARCOMA: Resection   Procedure: Total hysterectomy and bilateral salpingo-oophorectomy  Specimen  Integrity:  Focally disrupted on posterior surface  Tumor Site: Anterior wall  Tumor Size: 6 cm  Histologic Type: High grade sarcoma, mixed. See comment.  Other Tissue/ Organ Involvement: Not identified  Lymphovascular Invasion: Present, extensive.  Margins: All margins negative for tumor  Regional Lymph Nodes: Not applicable (no lymph nodes submitted or found)  Distant Metastasis:       Distant Site(s) Involved: Not applicable  Pathologic Stage Classification (pTNM, AJCC 8th Edition): pT1b, pN not  assigned  Ancillary Studies: Can be performed upon request  Representative Tumor Block: A6  Comment(s): The tumor has two morphologically different components. One component consists of malignant spindle cells which can be seen arising from the smooth muscle. These foci are positive for SMA, desmin, cyclinD1 (focal), and CD10 (patchy). The other component consists of round to slightly spindle cells with abundant admixed vessels and occasional large pleomorphic cells. These areas are positive for SMA (patchy weak), desmin (focal), CD10 (variable with diffuse areas). Pancytokeratin is negative. 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   ADDENDUM:   PROGNOSTIC INDICATOR RESULTS:   Immunohistochemical and morphometric analysis performed manually    Estrogen Receptor:       POSITIVE, 50%, WEAK TO MODERATE STAINING  Progesterone Receptor:   POSITIVE, 50%, MODERATE TO STRONG STAINING   Reference Range Estrogen and Progesterone Receptor       Negative  0%       Positive  >1%    06/03/2021 Surgery   Surgeon: Diania Fortes    Assistants: Dr Abdul Hodgkin (an MD assistant was necessary for tissue manipulation, management of robotic instrumentation, retraction and positioning due to the complexity of the case and hospital policies).  Operation: Robotic-assisted laparoscopic total hysterectomy >250gm with bilateral  salpingoophorectomy, minilaparotomy for specimen delivery   Surgeon: Diania Fortes    Operative Findings:  : Bulky 16cm uterus with intra-uterine mass (consistent with a fibroid-like mass) , smooth normal appearing serosa, no suspicious bulky nodes. Normal ovaries bilaterally. Normal upper abdomen. Uterus too large to deliver vaginally in tact.    06/18/2021 Imaging   1. Multiple bilateral pulmonary nodules, measuring up to 9 mm. Most of these are perifissural and subpleural in location, likely lymph nodes. Nevertheless, close follow-up recommended to exclude metastatic disease. 2. 11 mm hypoattenuating lesion towards the dome of the liver, indeterminate. MRI abdomen with and without contrast could be used to further evaluate as clinically warranted. 3. Aortic Atherosclerosis (ICD10-I70.0).   10/13/2021 Imaging   1. No acute findings within the abdomen or pelvis. No specific findings identified to suggest residual or recurrence of tumor. 2. Stable small pulmonary nodules within the left lower lobe.   03/18/2022 Imaging   1. Interval development of 2 soft tissue nodules in the anterior pelvis measuring 10 mm and 7 mm respectively. Close follow-up recommended to exclude metastatic disease. PET-CT may be warranted to further evaluate. 2. Development of 2 mm left upper lobe parenchymal nodule, nonspecific. Close attention on follow-up imaging recommended  3. Stable appearance of index bilateral pulmonary nodules identified previously. 4. Aortic Atherosclerosis (ICD10-I70.0).   04/02/2022 PET scan   1. Signs of local tumor recurrence noted at the vaginal cuff. 2. 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 New since 10/13/2021 and progressive when compared with 03/17/2022. 3. New focal area of increased uptake within the right biceps femoris muscle which is suspicious for skeletal muscle involvement. 4. Stable appearance of small pulmonary  nodules  described on 03/17/2022. These are too small to characterize by PET-CT.   04/09/2022 Procedure   Successful placement of a right IJ approach Power Port with ultrasound and fluoroscopic guidance. The catheter is ready for use.   04/09/2022 Echocardiogram    1. Left ventricular ejection fraction, by estimation, is 65 to 70%. The left ventricle has normal function. The left ventricle has no regional wall motion abnormalities. There is mild concentric left ventricular hypertrophy. Left ventricular diastolic parameters are consistent with Grade I diastolic dysfunction (impaired relaxation).  2. Right ventricular systolic function is normal. The right ventricular size is normal.  3. The mitral valve is normal in structure. No evidence of mitral valve regurgitation.  4. The aortic valve is tricuspid. Aortic valve regurgitation is not visualized. No aortic stenosis is present.  5. The inferior vena cava is normal in size with greater than 50% respiratory variability, suggesting right atrial pressure of 3 mmHg.   04/16/2022 - 05/28/2022 Chemotherapy   Patient is on Treatment Plan : UTERINE LEIOMYOSARCOMA Doxorubicin  q21d x 6 Cycles     06/10/2022 Imaging   1. Status post hysterectomy and bilateral oophorectomy. Multiple newand enlarged peritoneal nodules throughout the low abdomen and pelvis, previously FDG avid and consistent with worsened locally recurrent and peritoneal metastatic disease. 2. Multiple small bilateral pulmonary nodules, several of which are slightly enlarged compared to prior examination, consistent with worsened pulmonary metastatic disease.   Aortic Atherosclerosis (ICD10-I70.0).     06/25/2022 - 03/04/2023 Chemotherapy   Patient is on Treatment Plan : UTERINE UNDIFFERENTIATED LEIOMYOSARCOMA Gemcitabine  D1,8 + Docetaxel  D8 (900/100) q21d     09/02/2022 Imaging   1. Moderate response to therapy of peritoneal metastasis within the anterior pelvis. 2. Minimal response to therapy of  pulmonary metastasis. 3. No new or progressive disease. 4.  Aortic Atherosclerosis (ICD10-I70.0).     12/01/2022 Imaging   1. No new or progressive findings in the chest, abdomen, or pelvis. 2. Peritoneal metastases in the pelvis described previously measure smaller today compatible with interval response to therapy. 3. Stable tiny bilateral pulmonary nodules. Previously characterized as metastatic lesions, these are unchanged in the interval. No new suspicious pulmonary nodule or mass. 4.  Aortic Atherosclerois (ICD10-170.0)   03/04/2023 Imaging   CT CHEST ABDOMEN PELVIS W CONTRAST  Result Date: 03/03/2023 CLINICAL DATA:  Cervical cancer * Tracking Code: BO *. Assess treatment response. Current chemotherapy. Prior total hysterectomy EXAM: CT CHEST, ABDOMEN, AND PELVIS WITH CONTRAST TECHNIQUE: Multidetector CT imaging of the chest, abdomen and pelvis was performed following the standard protocol during bolus administration of intravenous contrast. RADIATION DOSE REDUCTION: This exam was performed according to the departmental dose-optimization program which includes automated exposure control, adjustment of the mA and/or kV according to patient size and/or use of iterative reconstruction technique. CONTRAST:  OMNIPAQUE  IOHEXOL  300 MG/ML  SOLN COMPARISON:  CT 12/01/2022 and older FINDINGS: CT CHEST FINDINGS Cardiovascular: Right upper chest port. Heart is nonenlarged. Trace pericardial fluid. Normal caliber thoracic aorta. Mediastinum/Nodes: No specific abnormal lymph node enlargement seen in the axillary region, hilum or mediastinum. Mildly patulous thoracic esophagus. Thyroid gland is unremarkable. Lungs/Pleura: Tiny pleural effusions are seen, left-greater-than-right. These new from previous. There is some patchy parenchymal opacity identified in the superior segment of the lower lobes, left-greater-than-right. This could be infiltrative rather than neoplastic. Recommend short follow-up. There  were some small nodules identified on the prior. For example 3 mm nodule lateral left lower lobe subpleural on series 7, image 91 is stable.  Right lower lobe subpleural nodule on the prior which measured 9 x 6 mm, today is stable on image 73 of series 7. Stable 2-3 mm nodule right lower lobe on series 7, image 97. Musculoskeletal: Curvature of the spine with some degenerative changes. CT ABDOMEN PELVIS FINDINGS Hepatobiliary: Stable dome hepatic cyst. No new space-occupying lesion in the liver. Patent portal vein. Gallbladder is distended. Pancreas: Unremarkable. No pancreatic ductal dilatation or surrounding inflammatory changes. Spleen: Spleen is slightly enlarged at 13.1 cm in AP length. Preserved enhancement. Small splenule inferiorly. Adrenals/Urinary Tract: The adrenal glands are preserved. Tiny low-attenuation cystic lesions are seen which are too small to completely characterize along the kidneys. Bosniak 2 lesions. No specific imaging follow-up. The ureters have normal course and caliber down to the bladder. Bladder wall is diffusely thickened. The bladder is contracted. There is significant stranding. This is progressive from the previous examination. Stomach/Bowel: Large bowel has a normal course and caliber with scattered stool. Overall moderate stool burden. Normal appendix extends inferior to the cecum in the right lower quadrant. The stomach and small bowel are nondilated. Vascular/Lymphatic: Normal caliber aorta and IVC with some vascular calcifications. No specific abnormal lymph node enlargement identified in the abdomen and pelvis. Reproductive: Absence of the uterus and ovaries. Other: Once again there is a pelvic soft tissue nodule anteriorly on series 2, image 104 which on the prior measured 2.1 x 1.6 cm and today 1.9 x 1.3 cm. Smaller foci more caudal on axial image 111 and 112 are stable. Musculoskeletal: Curvature and degenerative changes are seen along the spine. Degenerative changes along  the pelvis. IMPRESSION: Stable pelvic peritoneal nodules. No new lymph node enlargement seen in the chest, abdomen or pelvis. New tiny pleural effusions, left-greater-than-right with some ill-defined parenchymal opacities along the lower lobes. Although there is a differential this could be infectious or inflammatory. Recommend short follow-up and correlation to specific symptoms. Previous tiny lung nodules are stable. Continued follow-up surveillance as per the patient's neoplasm. Mild splenomegaly. Worsening bladder wall thickening and stranding. Please correlate with symptoms and treatment Electronically Signed   By: Adrianna Horde M.D.   On: 03/03/2023 18:23      04/05/2023 Imaging   Normal bone density scan   06/21/2023 Imaging   CT CHEST ABDOMEN PELVIS W CONTRAST  Result Date: 06/20/2023 CLINICAL DATA:  Endometrial cancer restaging, chemotherapy complete, ongoing oral chemotherapy * Tracking Code: BO * EXAM: CT CHEST, ABDOMEN, AND PELVIS WITH CONTRAST TECHNIQUE: Multidetector CT imaging of the chest, abdomen and pelvis was performed following the standard protocol during bolus administration of intravenous contrast. RADIATION DOSE REDUCTION: This exam was performed according to the departmental dose-optimization program which includes automated exposure control, adjustment of the mA and/or kV according to patient size and/or use of iterative reconstruction technique. CONTRAST:  OMNIPAQUE  IOHEXOL  300 MG/ML  SOLN COMPARISON:  CT chest angiogram, 03/23/2023, CT chest abdomen pelvis, 03/02/2023 FINDINGS: CT CHEST FINDINGS Cardiovascular: No significant vascular findings. Normal heart size. No pericardial effusion. Mediastinum/Nodes: No enlarged mediastinal, hilar, or axillary lymph nodes. Thyroid gland, trachea, and esophagus demonstrate no significant findings. Lungs/Pleura: Interval resolution of previously seen pleural effusions. Significant interval improvement of previously seen heterogeneous and  ground-glass airspace opacity, with small residual opacities present in the lower lobes (series 4, image 68). Multiple small subpleural nodules are unchanged, largest in the dependent right lower lobe measuring 0.9 cm (series 4, image 75). Musculoskeletal: No chest wall abnormality. No acute osseous findings. CT ABDOMEN PELVIS FINDINGS Hepatobiliary: No solid liver  abnormality is seen. Benign simple cyst of the liver dome, requiring no further follow-up or characterization (series 2, image 45). No gallstones, gallbladder wall thickening, or biliary dilatation. Pancreas: Unremarkable. No pancreatic ductal dilatation or surrounding inflammatory changes. Spleen: Normal in size without significant abnormality. Adrenals/Urinary Tract: Adrenal glands are unremarkable. Simple, benign right renal cortical cysts, for which no further follow-up or characterization is required. Kidneys are otherwise normal, without renal calculi, solid lesion, or hydronephrosis. Bladder is unremarkable. Stomach/Bowel: Stomach is within normal limits. Appendix appears normal. No evidence of bowel wall thickening, distention, or inflammatory changes. Moderate burden of stool throughout the colon and rectum. Vascular/Lymphatic: No significant vascular findings are present. No enlarged abdominal or pelvic lymph nodes. Reproductive: Status post hysterectomy. Other: No abdominal wall hernia or abnormality. No ascites. Largest soft tissue nodule in the low midline abdomen is increased in size, measuring 2.8 x 2.5 cm, previously 2.4 x 2.1 cm (series 2, image 105). Multiple additional soft tissue nodules throughout the pelvis are unchanged, largest in the anterior right pelvis measuring 1.6 x 1.2 cm (series 2, image 112). Additional nodule at the right aspect of the vaginal cuff measures 2.0 x 1.0 cm (series 2, image 112). Musculoskeletal: No acute osseous findings. IMPRESSION: 1. Largest peritoneal soft tissue nodule in the low midline abdomen is  increased in size, consistent with worsened peritoneal metastatic disease. Multiple additional soft tissue nodules throughout the pelvis are unchanged. 2. 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 3. Interval resolution of previously seen pleural effusions. 4. Significant interval improvement of previously seen heterogeneous and ground-glass airspace opacity, with small residual opacities present in the lower lobes. Findings are consistent with improved nonspecific infection or inflammation. 5. Status post hysterectomy. Electronically Signed   By: Fredricka Jenny M.D.   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 * Tracking Code: BO * EXAM: CT CHEST, ABDOMEN, AND PELVIS WITH CONTRAST TECHNIQUE: Multidetector CT imaging of the chest, abdomen and pelvis was performed following the standard protocol during bolus administration of intravenous contrast. RADIATION DOSE REDUCTION: This exam was performed according to the departmental dose-optimization program which includes automated exposure control, adjustment of the mA and/or kV according to patient size and/or use of iterative reconstruction technique. CONTRAST:  OMNIPAQUE  IOHEXOL  300 MG/ML  SOLN COMPARISON:  Multiple priors including most recent CT June 18, 2023 FINDINGS: CT CHEST FINDINGS Cardiovascular: Aortic atherosclerosis. Accessed right chest Port-A-Cath with tip at the superior cavoatrial junction. No central pulmonary embolus on this nondedicated study. Normal size heart. No significant pericardial effusion/thickening. Mediastinum/Nodes: No suspicious thyroid nodule. No pathologically enlarged mediastinal, hilar or axillary lymph nodes. Small hiatal hernia. Lungs/Pleura: Bilateral solid pulmonary nodules, some of which are stable in size others are increased in size. For reference: -left upper  lobe pulmonary nodule measuring 6 mm on image 77/2 previously measured 3 mm. -subpleural right lower lobe pulmonary nodule measures 9 mm on image 77/7, unchanged. A few new scattered tiny pulmonary nodules for instance a 3 mm pulmonary nodule in the right lower lobe on image 87/7. Musculoskeletal: No aggressive lytic or blastic lesion of bone. Multilevel degenerative changes spine. CT ABDOMEN PELVIS FINDINGS Hepatobiliary: Stable 9 mm hypodense lesion in the dome of the liver previously characterized as a cyst on MRI July 04, 2021. No suspicious hepatic lesion. Gallbladder is unremarkable. No biliary ductal dilation. Pancreas: No pancreatic ductal dilation  or evidence of acute inflammation. Spleen: No splenomegaly. Adrenals/Urinary Tract: Bilateral adrenal glands appear normal. No hydronephrosis. Stomach/Bowel: No radiopaque enteric contrast material was administered. Stomach is unremarkable for degree of distension. Normal appendix. No pathologic dilation of small or large bowel. Vascular/Lymphatic: Aortic atherosclerosis. Normal caliber abdominal aorta. Smooth IVC contours. The portal, splenic and superior mesenteric veins are patent. No pathologically enlarged abdominal or pelvic lymph nodes. Reproductive: Prior hysterectomy with increased size of the vaginal cuff nodules previously indexed nodule in the right vaginal cuff measures 3.2 cm on image 116/2 previously 2.0 cm. Other: Increased size and number of peritoneal/omental soft tissue implants. For reference: -previously indexed soft tissue nodule in the anterior pelvis now measures 5.7 x 3.6 cm on image 105/2 previously 2.8 x 2.5 cm. -new pelvic soft tissue nodule measures 2.3 cm on image 106/2. Trace free fluid in the pelvis with a new walled-off pelvic fluid collection measuring 3.9 cm adjacent to a soft tissue implant on image 105/2. Musculoskeletal: No aggressive lytic or blastic lesion of bone. IMPRESSION: 1. Worsening peritoneal carcinomatosis,  increased size of the vaginal cuff nodules, and increased size/of bilateral solid pulmonary nodules, consistent with worsening metastatic disease. 2. Aortic Atherosclerosis (ICD10-I70.0). Electronically Signed   By: Tama Fails M.D.   On: 09/19/2023 12:38      10/05/2023 - 12/30/2023 Chemotherapy   Patient is on Treatment Plan : UTERINE UNDIFFERENTIATED LEIOMYOSARCOMA Gemcitabine  D1,8 + Docetaxel  D8 (900/100) q21d     01/10/2024 Imaging   CT CHEST ABDOMEN PELVIS W CONTRAST Result Date: 01/13/2024 CLINICAL DATA:  Musculoskeletal neoplasm, assess treatment response uterine sarcoma assess response to chemo. * Tracking Code: BO * EXAM: CT CHEST, ABDOMEN, AND PELVIS WITH CONTRAST TECHNIQUE: Multidetector CT imaging of the chest, abdomen and pelvis was performed following the standard protocol during bolus administration of intravenous contrast. RADIATION DOSE REDUCTION: This exam was performed according to the departmental dose-optimization program which includes automated exposure control, adjustment of the mA and/or kV according to patient size and/or use of iterative reconstruction technique. CONTRAST:  100mL ISOVUE -300 IOPAMIDOL  (ISOVUE -300) INJECTION 61% COMPARISON:  CT scan chest, abdomen and pelvis from 09/17/2023. FINDINGS: CT CHEST FINDINGS Cardiovascular: Normal cardiac size. No pericardial effusion. No aortic aneurysm. Mediastinum/Nodes: Visualized thyroid gland appears grossly unremarkable. No solid / cystic mediastinal masses. The esophagus is nondistended precluding optimal assessment. No axillary, mediastinal or hilar lymphadenopathy by size criteria. Lungs/Pleura: The central tracheo-bronchial tree is patent. Redemonstration of multiple (more than 20), solid bilateral lung nodules with largest in the right lung lower lobe measuring 8.2 x 9.5 mm (previously 6.7 x 7.5 mm) and largest in the left lung inferior lingula measuring 6.9 x 7.7 mm (previously 5.2 x 5.8 mm). No new mass or consolidation. No  pleural effusion or pneumothorax. Musculoskeletal: A CT Port-a-Cath is seen in the right upper chest wall with the catheter terminating in the lower portion of superior vena cava. Visualized soft tissues of the chest wall are otherwise grossly unremarkable. No suspicious osseous lesions. There are mild multilevel degenerative changes in the visualized spine. CT ABDOMEN PELVIS FINDINGS Hepatobiliary: The liver is normal in size. Non-cirrhotic configuration. There is stable cyst in the right hepatic lobe. Mild diffuse hepatic steatosis. No suspicious liver lesion. No intrahepatic or extrahepatic bile duct dilation. No calcified gallstones. Normal gallbladder wall thickness. No pericholecystic inflammatory changes. Pancreas: Unremarkable. No pancreatic ductal dilatation or surrounding inflammatory changes. Spleen: Within normal limits. No focal lesion. Adrenals/Urinary Tract: Adrenal glands are unremarkable. No suspicious renal mass. There are stable subcentimeter  partially exophytic simple cyst in the right kidney lower pole, medially. No nephroureterolithiasis or obstructive uropathy. Urinary bladder is under distended, precluding optimal assessment. However, no large mass or stones identified. No perivesical fat stranding. Redemonstration of prominent perivesical vessels, similar to the prior study. Stomach/Bowel: No disproportionate dilation of the small or large bowel loops. No evidence of abnormal bowel wall thickening or inflammatory changes. The appendix is unremarkable. Vascular/Lymphatic: No ascites or pneumoperitoneum. Redemonstration of multiple enlarged, heterogeneous, hyperattenuating tumor deposits mainly in the pelvis. There is significant interval increase in the size and enhancement when compared to the prior exam. Largest such deposit in the lower pelvis, anteriorly measures 6.6 x 7.9 cm on today's exam which measured up to 3.6 x 5.7 cm on the prior exam. No abdominal or pelvic lymphadenopathy, by  size criteria. No aneurysmal dilation of the major abdominal arteries. There are mild peripheral atherosclerotic vascular calcifications of the aorta and its major branches. Reproductive: The uterus is surgically absent. There are multiple tumor deposits surrounding the vaginal cuff and in the pelvis, as described above. Other: The visualized soft tissues and abdominal wall are unremarkable. Musculoskeletal: No suspicious osseous lesions. There are mild multilevel degenerative changes in the visualized spine. IMPRESSION: 1. Overall, worsening metastatic disease. 2. Interval increase in the size of multiple bilateral solid lung nodules, as described in detail above. 3. Redemonstration of multiple enlarged, heterogeneous, hyperattenuating tumor deposits mainly in the pelvis. There is significant interval increase in the size and enhancement when compared to the prior exam. 4. No new metastatic disease identified within the chest, abdomen or pelvis. 5. Multiple other nonacute observations, as described above. Aortic Atherosclerosis (ICD10-I70.0). Electronically Signed   By: Beula Brunswick M.D.   On: 01/13/2024 10:41      01/26/2024 Echocardiogram     1. Left ventricular ejection fraction, by estimation, is 60 to 65%. The left ventricle has normal function. The left ventricle has no regional wall motion abnormalities. There is mild left ventricular hypertrophy. Left ventricular diastolic parameters  are consistent with Grade I diastolic dysfunction (impaired relaxation).  2. Right ventricular systolic function is normal. The right ventricular size is normal. Tricuspid regurgitation signal is inadequate for assessing PA pressure.  3. The mitral valve is normal in structure. No evidence of mitral valve regurgitation. No evidence of mitral stenosis.  4. The aortic valve is tricuspid. There is mild calcification of the aortic valve. There is mild thickening of the aortic valve. Aortic valve regurgitation is not  visualized. No aortic stenosis is present.  5. The inferior vena cava is normal in size with greater than 50% respiratory variability, suggesting right atrial pressure of 3 mmHg.   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 Increased complex enhancing soft tissue nodularity in the low abdomen and pelvis, consistent with worsening disease. 2. Increased size of the bilateral pulmonary nodules with a new 3 mm right upper lobe pulmonary nodule, consistent with worsening pulmonary metastatic disease. 3. Increased size of the retroperitoneal, mesenteric and iliac side chain lymph nodes, consistent with worsening nodal metastatic disease. 4. Small hiatal hernia with a patulous esophagus.      I discussed the assessment and treatment plan with the patient. The patient was provided an opportunity to ask questions and all were answered. The patient agreed with the plan and demonstrated an understanding of the instructions. The patient was advised to call back or  seek an in-person evaluation if the symptoms worsen or if the condition fails to improve as anticipated.    I spent 20 minutes for the appointment reviewing test results, discuss management and coordination of care.  Almeda Jacobs, MD 03/31/2024 9:47 AM

## 2024-03-31 NOTE — Assessment & Plan Note (Signed)
 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

## 2024-04-03 ENCOUNTER — Other Ambulatory Visit: Payer: Self-pay | Admitting: Hematology and Oncology

## 2024-04-03 ENCOUNTER — Telehealth: Payer: Self-pay

## 2024-04-03 DIAGNOSIS — C549 Malignant neoplasm of corpus uteri, unspecified: Secondary | ICD-10-CM

## 2024-04-03 NOTE — Telephone Encounter (Signed)
 She can proceed with dental cleaning and pedicure I will try to see when I can get her started

## 2024-04-03 NOTE — Progress Notes (Signed)
 DISCONTINUE OFF PATHWAY REGIMEN - Uterine   [id number]:Trabectedin  1.5 mg/m2 CIV D1 q21 Days:   A cycle is every 21 days:     Trabectedin    **Always confirm dose/schedule in your pharmacy ordering system**  PRIOR TREATMENT: Off Pathway: Trabectedin  1.5 mg/m2 CIV D1 q21 Days  START OFF PATHWAY REGIMEN - Uterine   [id number]:Pembrolizumab 200 mg IV D1 q21 Days:   A cycle is every 21 days:     Pembrolizumab   **Always confirm dose/schedule in your pharmacy ordering system**  Patient Characteristics: High Grade Undifferentiated/Leiomyosarcoma, Recurrent/Progressive Disease, Unresectable, HER2 Negative/Unknown Histology: High Grade Undifferentiated/Leiomyosarcoma Therapeutic Status: Recurrent or Progressive Disease Resectability Status: Unresectable HER2 Expression Status by IHC: Negative (IHC 0, 1+) Intent of Therapy: Non-Curative / Palliative Intent, Discussed with Patient

## 2024-04-03 NOTE — Telephone Encounter (Signed)
 Called and given below message. She verbalized understanding.

## 2024-04-03 NOTE — Telephone Encounter (Signed)
 She called and left a message. She has decided to get Keytruda. She is asking if she can get her teeth cleaned and have a pedicure? It has been almost 4 weeks since chemo and asking if she can get teeth cleaned and have a pedicure while on Keytruda.

## 2024-04-04 ENCOUNTER — Telehealth: Payer: Self-pay | Admitting: Hematology and Oncology

## 2024-04-04 ENCOUNTER — Other Ambulatory Visit: Payer: Self-pay | Admitting: Hematology and Oncology

## 2024-04-04 ENCOUNTER — Encounter: Payer: Self-pay | Admitting: Hematology and Oncology

## 2024-04-05 NOTE — Progress Notes (Signed)
 Pharmacist Chemotherapy Monitoring - Initial Assessment    Anticipated start date: 04/14/24   The following has been reviewed per standard work regarding the patient's treatment regimen: The patient's diagnosis, treatment plan and drug doses, and organ/hematologic function Lab orders and baseline tests specific to treatment regimen  The treatment plan start date, drug sequencing, and pre-medications Prior authorization status  Patient's documented medication list, including drug-drug interaction screen and prescriptions for anti-emetics and supportive care specific to the treatment regimen The drug concentrations, fluid compatibility, administration routes, and timing of the medications to be used The patient's access for treatment and lifetime cumulative dose history, if applicable  The patient's medication allergies and previous infusion related reactions, if applicable   Changes made to treatment plan:  N/A  Follow up needed:  Pending authorization for treatment    Leah Diaz, PharmD, MBA

## 2024-04-06 ENCOUNTER — Other Ambulatory Visit: Payer: Self-pay | Admitting: Hematology and Oncology

## 2024-04-13 ENCOUNTER — Encounter: Payer: Self-pay | Admitting: Hematology and Oncology

## 2024-04-13 ENCOUNTER — Telehealth: Payer: Self-pay

## 2024-04-13 NOTE — Telephone Encounter (Signed)
 Called and canceled port/lab flush and infusion appt for tomorrow due to insurance denial. Switched appt tomorrow per her request to phone visit to discuss options.

## 2024-04-14 ENCOUNTER — Inpatient Hospital Stay

## 2024-04-14 ENCOUNTER — Encounter: Payer: Self-pay | Admitting: Hematology and Oncology

## 2024-04-14 ENCOUNTER — Ambulatory Visit: Admitting: Hematology and Oncology

## 2024-04-14 ENCOUNTER — Telehealth: Payer: Self-pay

## 2024-04-14 ENCOUNTER — Inpatient Hospital Stay: Admitting: Hematology and Oncology

## 2024-04-14 ENCOUNTER — Other Ambulatory Visit

## 2024-04-14 ENCOUNTER — Other Ambulatory Visit: Payer: Self-pay | Admitting: Hematology and Oncology

## 2024-04-14 ENCOUNTER — Other Ambulatory Visit (HOSPITAL_COMMUNITY): Payer: Self-pay

## 2024-04-14 DIAGNOSIS — C549 Malignant neoplasm of corpus uteri, unspecified: Secondary | ICD-10-CM | POA: Diagnosis not present

## 2024-04-14 MED ORDER — PAZOPANIB HCL 200 MG PO TABS: 800.0000 mg | ORAL_TABLET | Freq: Every day | ORAL | 11 refills | Status: DC

## 2024-04-14 NOTE — Telephone Encounter (Signed)
 Oral Oncology Patient Advocate Encounter  New authorization   Received notification that prior authorization for Pazopanib is required.   PA submitted on 04/14/24  Key [id number]  Status is pending     Hansel Ley, CPhT Pharmacy Technician Coordinator [facility] [phone] (Ph) 04/14/2024 3:49 PM

## 2024-04-14 NOTE — Progress Notes (Addendum)
 HEMATOLOGY-ONCOLOGY ELECTRONIC VISIT PROGRESS NOTE  Patient Care Team: Almeda Jacobs, MD as PCP - General (Hematology and Oncology) Lasalle Pointer, MD as PCP - Cardiology (Cardiology) Suzi Essex, MD as Consulting Physician (Gynecologic Oncology) Neda Balk, RN as Registered Nurse  I connected with the patient via telephone conference and verified that I am speaking with the correct person using two identifiers. The patient's location is at home and I am providing care from the [facility] I discussed the limitations, risks, security and privacy concerns of performing an evaluation and management service by e-visits and the availability of in person appointments.  I also discussed with the patient that there may be a patient responsible charge related to this service. The patient expressed understanding and agreed to proceed.   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%, PDL-1 5%, p53 mutated, BRAF mutation not detected, SI stable, Low TMB, BRCA neg, Her2/neu neg, RET/NTRK not detected, ESR1 not detected Failed doxorubicin , progressed on gemcitabine  & taxotere , letrozole, Lupron  with tamoxifen  and trabectedin   Her insurance did not approve the use of pembrolizumab despite PD-L1 being 5% positive We have submitted paperwork to try to get compassionate release of drug from [employer] Alternative of pursuing pazopanib was discussed with the patient and ultimately, the patient has made informed decision to wait to see if she can get compassionate use of free drug  No orders of the defined types were placed in this encounter.  3:20 PM: I was notified that we were not able to get compassionate release of drug unless we submit an appeal.  In my previous experience, an appeal can take a long time.  I contacted the  patient and recommend we switch her decision to pazopanib instead and she agreed to proceed.  I will try to get insurance prior authorization for positive note and will start the treatment as soon as possible  INTERVAL HISTORY: Please see below for problem oriented charting. The purpose of today's discussion is to discuss changes in her treatment Her treatment is canceled today because her insurance refused to approve pembrolizumab Currently, she is not symptomatic We discussed risk and benefits of waiting for compassionate use of pembrolizumab through drug maker versus changing treatment to pazopanib  SUMMARY OF ONCOLOGIC HISTORY: Oncology History Overview Note  High grade and LMS, 50% ER/PR positive dMMR normal, PD-L1 CPS 3% CARIS: ER60%, PR50%, PDL-1 5%, p53 mutated, BRAF mutation not detected, SI stable, Low TMB, BRCA neg, Her2/neu neg, RET/NTRK not detected, ESR1 not detected Failed doxorubicin , progressed on gemcitabine  & taxotere , letrozole, Lupron  with tamoxifen  and trabectedin    Uterine sarcoma (HCC)  06/02/2021 Imaging   MRI pelvis Heterogeneous 9.0 cm intrauterine mass within the intramural/submucosal anterior fundus demonstrating interval growth, internal enhancement, and restricted diffusion 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 No extra uterine extension. No evidence of metastatic disease within the pelvis.   Mild thickening of the endometrial stripe, likely related to entrapment by the adjacent uterine mass   06/03/2021 Initial Diagnosis   Uterine sarcoma (HCC)   06/03/2021 Cancer Staging   Staging form: Corpus Uteri - Sarcoma, AJCC 7th Edition - Clinical stage from 06/03/2021: FIGO Stage IVB (rT1b, N0, M1) - Signed by Almeda Jacobs, MD on 03/31/2022 Diagnostic confirmation: Positive histology Stage prefix: Recurrence Biopsy of metastatic site performed: No Lymph-vascular invasion (LVI): LVI present/identified, NOS  06/03/2021  Pathology Results   FINAL MICROSCOPIC DIAGNOSIS:   A. UTERUS, CERVIX, BILATERAL FALLOPIAN TUBES AND OVARIES, HYSTERECTOMY:  - Uterus:       Mixed high grade uterine sarcoma, spanning 6 cm, see comment.       Extensive lymphovascular invasion.       See oncology table.  - Cervix: Benign squamous and endocervical mucosa. No dysplasia or  malignancy.  - Bilateral ovaries: Unremarkable. No malignancy.  - Bilateral fallopian tubes: Unremarkable. No malignancy.   ONCOLOGY TABLE:   UTERUS, SARCOMA: Resection   Procedure: Total hysterectomy and bilateral salpingo-oophorectomy  Specimen Integrity: Focally disrupted on posterior surface  Tumor Site: Anterior wall  Tumor Size: 6 cm  Histologic Type: High grade sarcoma, mixed. See comment.  Other Tissue/ Organ Involvement: Not identified  Lymphovascular Invasion: Present, extensive.  Margins: All margins negative for tumor  Regional Lymph Nodes: Not applicable (no lymph nodes submitted or found)  Distant Metastasis:       Distant Site(s) Involved: Not applicable  Pathologic Stage Classification (pTNM, AJCC 8th Edition): pT1b, pN not  assigned  Ancillary Studies: Can be performed upon request  Representative Tumor Block: A6  Comment(s): The tumor has two morphologically different components. One component consists of malignant spindle cells which can be seen arising from the smooth muscle. These foci are positive for SMA, desmin, cyclinD1 (focal), and CD10 (patchy). The other component consists of round to slightly spindle cells with abundant admixed vessels and occasional large pleomorphic cells. These areas are positive for SMA (patchy weak), desmin (focal), CD10 (variable with diffuse areas). Pancytokeratin is negative. 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   ADDENDUM:   PROGNOSTIC INDICATOR RESULTS:   Immunohistochemical and morphometric analysis performed  manually    Estrogen Receptor:       POSITIVE, 50%, WEAK TO MODERATE STAINING  Progesterone Receptor:   POSITIVE, 50%, MODERATE TO STRONG STAINING   Reference Range Estrogen and Progesterone Receptor       Negative  0%       Positive  >1%    06/03/2021 Surgery   Surgeon: Diania Fortes    Assistants: Dr Abdul Hodgkin (an MD assistant was necessary for tissue manipulation, management of robotic instrumentation, retraction and positioning due to the complexity of the case and hospital policies).  Operation: Robotic-assisted laparoscopic total hysterectomy >250gm with bilateral salpingoophorectomy, minilaparotomy for specimen delivery   Surgeon: Diania Fortes    Operative Findings:  : Bulky 16cm uterus with intra-uterine mass (consistent with a fibroid-like mass) , smooth normal appearing serosa, no suspicious bulky nodes. Normal ovaries bilaterally. Normal upper abdomen. Uterus too large to deliver vaginally in tact.    06/18/2021 Imaging   1. Multiple bilateral pulmonary nodules, measuring up to 9 mm. Most of these are perifissural and subpleural in location, likely lymph nodes. Nevertheless, close follow-up recommended to exclude metastatic disease. 2. 11 mm hypoattenuating lesion towards the dome of the liver, indeterminate. MRI abdomen with and without contrast could be used to further evaluate as clinically warranted. 3. Aortic Atherosclerosis (ICD10-I70.0).   10/13/2021 Imaging   1. No acute findings within the abdomen or pelvis. No specific findings identified to suggest residual or recurrence of tumor. 2. Stable small pulmonary nodules within the left lower lobe.   03/18/2022 Imaging   1. Interval development of 2 soft tissue nodules in the anterior pelvis measuring 10 mm and 7 mm respectively. Close follow-up recommended to exclude metastatic  disease. PET-CT may be warranted to further evaluate. 2. Development of 2 mm left upper lobe parenchymal nodule, nonspecific.  Close attention on follow-up imaging recommended  3. Stable appearance of index bilateral pulmonary nodules identified previously. 4. Aortic Atherosclerosis (ICD10-I70.0).   04/02/2022 PET scan   1. Signs of local tumor recurrence noted at the vaginal cuff. 2. 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 New since 10/13/2021 and progressive when compared with 03/17/2022. 3. New focal area of increased uptake within the right biceps femoris muscle which is suspicious for skeletal muscle involvement. 4. Stable appearance of small pulmonary nodules described on 03/17/2022. These are too small to characterize by PET-CT.   04/09/2022 Procedure   Successful placement of a right IJ approach Power Port with ultrasound and fluoroscopic guidance. The catheter is ready for use.   04/09/2022 Echocardiogram    1. Left ventricular ejection fraction, by estimation, is 65 to 70%. The left ventricle has normal function. The left ventricle has no regional wall motion abnormalities. There is mild concentric left ventricular hypertrophy. Left ventricular diastolic parameters are consistent with Grade I diastolic dysfunction (impaired relaxation).  2. Right ventricular systolic function is normal. The right ventricular size is normal.  3. The mitral valve is normal in structure. No evidence of mitral valve regurgitation.  4. The aortic valve is tricuspid. Aortic valve regurgitation is not visualized. No aortic stenosis is present.  5. The inferior vena cava is normal in size with greater than 50% respiratory variability, suggesting right atrial pressure of 3 mmHg.   04/16/2022 - 05/28/2022 Chemotherapy   Patient is on Treatment Plan : UTERINE LEIOMYOSARCOMA Doxorubicin  q21d x 6 Cycles     06/10/2022 Imaging   1. Status post hysterectomy and bilateral oophorectomy. Multiple newand enlarged peritoneal nodules throughout the low abdomen and pelvis, previously FDG avid and consistent  with worsened locally recurrent and peritoneal metastatic disease. 2. Multiple small bilateral pulmonary nodules, several of which are slightly enlarged compared to prior examination, consistent with worsened pulmonary metastatic disease.   Aortic Atherosclerosis (ICD10-I70.0).     06/25/2022 - 03/04/2023 Chemotherapy   Patient is on Treatment Plan : UTERINE UNDIFFERENTIATED LEIOMYOSARCOMA Gemcitabine  D1,8 + Docetaxel  D8 (900/100) q21d     09/02/2022 Imaging   1. Moderate response to therapy of peritoneal metastasis within the anterior pelvis. 2. Minimal response to therapy of pulmonary metastasis. 3. No new or progressive disease. 4.  Aortic Atherosclerosis (ICD10-I70.0).     12/01/2022 Imaging   1. No new or progressive findings in the chest, abdomen, or pelvis. 2. Peritoneal metastases in the pelvis described previously measure smaller today compatible with interval response to therapy. 3. Stable tiny bilateral pulmonary nodules. Previously characterized as metastatic lesions, these are unchanged in the interval. No new suspicious pulmonary nodule or mass. 4.  Aortic Atherosclerois (ICD10-170.0)   03/04/2023 Imaging   CT CHEST ABDOMEN PELVIS W CONTRAST  Result Date: 03/03/2023 CLINICAL DATA:  Cervical cancer * Tracking Code: BO *. Assess treatment response. Current chemotherapy. Prior total hysterectomy EXAM: CT CHEST, ABDOMEN, AND PELVIS WITH CONTRAST TECHNIQUE: Multidetector CT imaging of the chest, abdomen and pelvis was performed following the standard protocol during bolus administration of intravenous contrast. RADIATION DOSE REDUCTION: This exam was performed according to the departmental dose-optimization program which includes automated exposure control, adjustment of the mA and/or kV according to patient size and/or use of iterative reconstruction technique. CONTRAST:  OMNIPAQUE  IOHEXOL  300 MG/ML  SOLN COMPARISON:  CT 12/01/2022 and older FINDINGS: CT CHEST  FINDINGS  Cardiovascular: Right upper chest port. Heart is nonenlarged. Trace pericardial fluid. Normal caliber thoracic aorta. Mediastinum/Nodes: No specific abnormal lymph node enlargement seen in the axillary region, hilum or mediastinum. Mildly patulous thoracic esophagus. Thyroid gland is unremarkable. Lungs/Pleura: Tiny pleural effusions are seen, left-greater-than-right. These new from previous. There is some patchy parenchymal opacity identified in the superior segment of the lower lobes, left-greater-than-right. This could be infiltrative rather than neoplastic. Recommend short follow-up. There were some small nodules identified on the prior. For example 3 mm nodule lateral left lower lobe subpleural on series 7, image 91 is stable. Right lower lobe subpleural nodule on the prior which measured 9 x 6 mm, today is stable on image 73 of series 7. Stable 2-3 mm nodule right lower lobe on series 7, image 97. Musculoskeletal: Curvature of the spine with some degenerative changes. CT ABDOMEN PELVIS FINDINGS Hepatobiliary: Stable dome hepatic cyst. No new space-occupying lesion in the liver. Patent portal vein. Gallbladder is distended. Pancreas: Unremarkable. No pancreatic ductal dilatation or surrounding inflammatory changes. Spleen: Spleen is slightly enlarged at 13.1 cm in AP length. Preserved enhancement. Small splenule inferiorly. Adrenals/Urinary Tract: The adrenal glands are preserved. Tiny low-attenuation cystic lesions are seen which are too small to completely characterize along the kidneys. Bosniak 2 lesions. No specific imaging follow-up. The ureters have normal course and caliber down to the bladder. Bladder wall is diffusely thickened. The bladder is contracted. There is significant stranding. This is progressive from the previous examination. Stomach/Bowel: Large bowel has a normal course and caliber with scattered stool. Overall moderate stool burden. Normal appendix extends inferior to the cecum in the  right lower quadrant. The stomach and small bowel are nondilated. Vascular/Lymphatic: Normal caliber aorta and IVC with some vascular calcifications. No specific abnormal lymph node enlargement identified in the abdomen and pelvis. Reproductive: Absence of the uterus and ovaries. Other: Once again there is a pelvic soft tissue nodule anteriorly on series 2, image 104 which on the prior measured 2.1 x 1.6 cm and today 1.9 x 1.3 cm. Smaller foci more caudal on axial image 111 and 112 are stable. Musculoskeletal: Curvature and degenerative changes are seen along the spine. Degenerative changes along the pelvis. IMPRESSION: Stable pelvic peritoneal nodules. No new lymph node enlargement seen in the chest, abdomen or pelvis. New tiny pleural effusions, left-greater-than-right with some ill-defined parenchymal opacities along the lower lobes. Although there is a differential this could be infectious or inflammatory. Recommend short follow-up and correlation to specific symptoms. Previous tiny lung nodules are stable. Continued follow-up surveillance as per the patient's neoplasm. Mild splenomegaly. Worsening bladder wall thickening and stranding. Please correlate with symptoms and treatment Electronically Signed   By: Adrianna Horde M.D.   On: 03/03/2023 18:23      04/05/2023 Imaging   Normal bone density scan   06/21/2023 Imaging   CT CHEST ABDOMEN PELVIS W CONTRAST  Result Date: 06/20/2023 CLINICAL DATA:  Endometrial cancer restaging, chemotherapy complete, ongoing oral chemotherapy * Tracking Code: BO * EXAM: CT CHEST, ABDOMEN, AND PELVIS WITH CONTRAST TECHNIQUE: Multidetector CT imaging of the chest, abdomen and pelvis was performed following the standard protocol during bolus administration of intravenous contrast. RADIATION DOSE REDUCTION: This exam was performed according to the departmental dose-optimization program which includes automated exposure control, adjustment of the mA and/or kV according to patient  size and/or use of iterative reconstruction technique. CONTRAST:  OMNIPAQUE  IOHEXOL  300 MG/ML  SOLN COMPARISON:  CT chest angiogram, 03/23/2023, CT chest abdomen pelvis,  03/02/2023 FINDINGS: CT CHEST FINDINGS Cardiovascular: No significant vascular findings. Normal heart size. No pericardial effusion. Mediastinum/Nodes: No enlarged mediastinal, hilar, or axillary lymph nodes. Thyroid gland, trachea, and esophagus demonstrate no significant findings. Lungs/Pleura: Interval resolution of previously seen pleural effusions. Significant interval improvement of previously seen heterogeneous and ground-glass airspace opacity, with small residual opacities present in the lower lobes (series 4, image 68). Multiple small subpleural nodules are unchanged, largest in the dependent right lower lobe measuring 0.9 cm (series 4, image 75). Musculoskeletal: No chest wall abnormality. No acute osseous findings. CT ABDOMEN PELVIS FINDINGS Hepatobiliary: No solid liver abnormality is seen. Benign simple cyst of the liver dome, requiring no further follow-up or characterization (series 2, image 45). No gallstones, gallbladder wall thickening, or biliary dilatation. Pancreas: Unremarkable. No pancreatic ductal dilatation or surrounding inflammatory changes. Spleen: Normal in size without significant abnormality. Adrenals/Urinary Tract: Adrenal glands are unremarkable. Simple, benign right renal cortical cysts, for which no further follow-up or characterization is required. Kidneys are otherwise normal, without renal calculi, solid lesion, or hydronephrosis. Bladder is unremarkable. Stomach/Bowel: Stomach is within normal limits. Appendix appears normal. No evidence of bowel wall thickening, distention, or inflammatory changes. Moderate burden of stool throughout the colon and rectum. Vascular/Lymphatic: No significant vascular findings are present. No enlarged abdominal or pelvic lymph nodes. Reproductive: Status post hysterectomy.  Other: No abdominal wall hernia or abnormality. No ascites. Largest soft tissue nodule in the low midline abdomen is increased in size, measuring 2.8 x 2.5 cm, previously 2.4 x 2.1 cm (series 2, image 105). Multiple additional soft tissue nodules throughout the pelvis are unchanged, largest in the anterior right pelvis measuring 1.6 x 1.2 cm (series 2, image 112). Additional nodule at the right aspect of the vaginal cuff measures 2.0 x 1.0 cm (series 2, image 112). Musculoskeletal: No acute osseous findings. IMPRESSION: 1. Largest peritoneal soft tissue nodule in the low midline abdomen is increased in size, consistent with worsened peritoneal metastatic disease. Multiple additional soft tissue nodules throughout the pelvis are unchanged. 2. 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 3. Interval resolution of previously seen pleural effusions. 4. Significant interval improvement of previously seen heterogeneous and ground-glass airspace opacity, with small residual opacities present in the lower lobes. Findings are consistent with improved nonspecific infection or inflammation. 5. Status post hysterectomy. Electronically Signed   By: Fredricka Jenny M.D.   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 * Tracking Code: BO * EXAM: CT CHEST, ABDOMEN, AND PELVIS WITH CONTRAST TECHNIQUE: Multidetector CT imaging of the chest, abdomen and pelvis was performed following the standard protocol during bolus administration of intravenous contrast. RADIATION DOSE REDUCTION: This exam was performed according to the departmental dose-optimization program which includes automated exposure control, adjustment of the mA and/or kV according to patient size and/or use of iterative reconstruction technique. CONTRAST:  OMNIPAQUE  IOHEXOL  300 MG/ML  SOLN COMPARISON:  Multiple  priors including most recent CT June 18, 2023 FINDINGS: CT CHEST FINDINGS Cardiovascular: Aortic atherosclerosis. Accessed right chest Port-A-Cath with tip at the superior cavoatrial junction. No central pulmonary embolus on this nondedicated study. Normal size heart. No significant pericardial effusion/thickening. Mediastinum/Nodes: No suspicious thyroid nodule. No pathologically enlarged mediastinal, hilar or axillary lymph nodes. Small hiatal hernia. Lungs/Pleura: Bilateral solid pulmonary nodules, some of which are stable in size others are increased in size. For reference: -left upper  lobe pulmonary nodule measuring 6 mm on image 77/2 previously measured 3 mm. -subpleural right lower lobe pulmonary nodule measures 9 mm on image 77/7, unchanged. A few new scattered tiny pulmonary nodules for instance a 3 mm pulmonary nodule in the right lower lobe on image 87/7. Musculoskeletal: No aggressive lytic or blastic lesion of bone. Multilevel degenerative changes spine. CT ABDOMEN PELVIS FINDINGS Hepatobiliary: Stable 9 mm hypodense lesion in the dome of the liver previously characterized as a cyst on MRI July 04, 2021. No suspicious hepatic lesion. Gallbladder is unremarkable. No biliary ductal dilation. Pancreas: No pancreatic ductal dilation or evidence of acute inflammation. Spleen: No splenomegaly. Adrenals/Urinary Tract: Bilateral adrenal glands appear normal. No hydronephrosis. Stomach/Bowel: No radiopaque enteric contrast material was administered. Stomach is unremarkable for degree of distension. Normal appendix. No pathologic dilation of small or large bowel. Vascular/Lymphatic: Aortic atherosclerosis. Normal caliber abdominal aorta. Smooth IVC contours. The portal, splenic and superior mesenteric veins are patent. No pathologically enlarged abdominal or pelvic lymph nodes. Reproductive: Prior hysterectomy with increased size of the vaginal cuff nodules previously indexed nodule in the right vaginal cuff  measures 3.2 cm on image 116/2 previously 2.0 cm. Other: Increased size and number of peritoneal/omental soft tissue implants. For reference: -previously indexed soft tissue nodule in the anterior pelvis now measures 5.7 x 3.6 cm on image 105/2 previously 2.8 x 2.5 cm. -new pelvic soft tissue nodule measures 2.3 cm on image 106/2. Trace free fluid in the pelvis with a new walled-off pelvic fluid collection measuring 3.9 cm adjacent to a soft tissue implant on image 105/2. Musculoskeletal: No aggressive lytic or blastic lesion of bone. IMPRESSION: 1. Worsening peritoneal carcinomatosis, increased size of the vaginal cuff nodules, and increased size/of bilateral solid pulmonary nodules, consistent with worsening metastatic disease. 2. Aortic Atherosclerosis (ICD10-I70.0). Electronically Signed   By: Tama Fails M.D.   On: 09/19/2023 12:38      10/05/2023 - 12/30/2023 Chemotherapy   Patient is on Treatment Plan : UTERINE UNDIFFERENTIATED LEIOMYOSARCOMA Gemcitabine  D1,8 + Docetaxel  D8 (900/100) q21d     01/10/2024 Imaging   CT CHEST ABDOMEN PELVIS W CONTRAST Result Date: 01/13/2024 CLINICAL DATA:  Musculoskeletal neoplasm, assess treatment response uterine sarcoma assess response to chemo. * Tracking Code: BO * EXAM: CT CHEST, ABDOMEN, AND PELVIS WITH CONTRAST TECHNIQUE: Multidetector CT imaging of the chest, abdomen and pelvis was performed following the standard protocol during bolus administration of intravenous contrast. RADIATION DOSE REDUCTION: This exam was performed according to the departmental dose-optimization program which includes automated exposure control, adjustment of the mA and/or kV according to patient size and/or use of iterative reconstruction technique. CONTRAST:  100mL ISOVUE -300 IOPAMIDOL  (ISOVUE -300) INJECTION 61% COMPARISON:  CT scan chest, abdomen and pelvis from 09/17/2023. FINDINGS: CT CHEST FINDINGS Cardiovascular: Normal cardiac size. No pericardial effusion. No aortic aneurysm.  Mediastinum/Nodes: Visualized thyroid gland appears grossly unremarkable. No solid / cystic mediastinal masses. The esophagus is nondistended precluding optimal assessment. No axillary, mediastinal or hilar lymphadenopathy by size criteria. Lungs/Pleura: The central tracheo-bronchial tree is patent. Redemonstration of multiple (more than 20), solid bilateral lung nodules with largest in the right lung lower lobe measuring 8.2 x 9.5 mm (previously 6.7 x 7.5 mm) and largest in the left lung inferior lingula measuring 6.9 x 7.7 mm (previously 5.2 x 5.8 mm). No new mass or consolidation. No pleural effusion or pneumothorax. Musculoskeletal: A CT Port-a-Cath is seen in the right upper chest wall with the catheter terminating in the lower portion of superior vena cava. Visualized  soft tissues of the chest wall are otherwise grossly unremarkable. No suspicious osseous lesions. There are mild multilevel degenerative changes in the visualized spine. CT ABDOMEN PELVIS FINDINGS Hepatobiliary: The liver is normal in size. Non-cirrhotic configuration. There is stable cyst in the right hepatic lobe. Mild diffuse hepatic steatosis. No suspicious liver lesion. No intrahepatic or extrahepatic bile duct dilation. No calcified gallstones. Normal gallbladder wall thickness. No pericholecystic inflammatory changes. Pancreas: Unremarkable. No pancreatic ductal dilatation or surrounding inflammatory changes. Spleen: Within normal limits. No focal lesion. Adrenals/Urinary Tract: Adrenal glands are unremarkable. No suspicious renal mass. There are stable subcentimeter partially exophytic simple cyst in the right kidney lower pole, medially. No nephroureterolithiasis or obstructive uropathy. Urinary bladder is under distended, precluding optimal assessment. However, no large mass or stones identified. No perivesical fat stranding. Redemonstration of prominent perivesical vessels, similar to the prior study. Stomach/Bowel: No disproportionate  dilation of the small or large bowel loops. No evidence of abnormal bowel wall thickening or inflammatory changes. The appendix is unremarkable. Vascular/Lymphatic: No ascites or pneumoperitoneum. Redemonstration of multiple enlarged, heterogeneous, hyperattenuating tumor deposits mainly in the pelvis. There is significant interval increase in the size and enhancement when compared to the prior exam. Largest such deposit in the lower pelvis, anteriorly measures 6.6 x 7.9 cm on today's exam which measured up to 3.6 x 5.7 cm on the prior exam. No abdominal or pelvic lymphadenopathy, by size criteria. No aneurysmal dilation of the major abdominal arteries. There are mild peripheral atherosclerotic vascular calcifications of the aorta and its major branches. Reproductive: The uterus is surgically absent. There are multiple tumor deposits surrounding the vaginal cuff and in the pelvis, as described above. Other: The visualized soft tissues and abdominal wall are unremarkable. Musculoskeletal: No suspicious osseous lesions. There are mild multilevel degenerative changes in the visualized spine. IMPRESSION: 1. Overall, worsening metastatic disease. 2. Interval increase in the size of multiple bilateral solid lung nodules, as described in detail above. 3. Redemonstration of multiple enlarged, heterogeneous, hyperattenuating tumor deposits mainly in the pelvis. There is significant interval increase in the size and enhancement when compared to the prior exam. 4. No new metastatic disease identified within the chest, abdomen or pelvis. 5. Multiple other nonacute observations, as described above. Aortic Atherosclerosis (ICD10-I70.0). Electronically Signed   By: Beula Brunswick M.D.   On: 01/13/2024 10:41      01/26/2024 Echocardiogram     1. Left ventricular ejection fraction, by estimation, is 60 to 65%. The left ventricle has normal function. The left ventricle has no regional wall motion abnormalities. There is mild  left ventricular hypertrophy. Left ventricular diastolic parameters  are consistent with Grade I diastolic dysfunction (impaired relaxation).  2. Right ventricular systolic function is normal. The right ventricular size is normal. Tricuspid regurgitation signal is inadequate for assessing PA pressure.  3. The mitral valve is normal in structure. No evidence of mitral valve regurgitation. No evidence of mitral stenosis.  4. The aortic valve is tricuspid. There is mild calcification of the aortic valve. There is mild thickening of the aortic valve. Aortic valve regurgitation is not visualized. No aortic stenosis is present.  5. The inferior vena cava is normal in size with greater than 50% respiratory variability, suggesting right atrial pressure of 3 mmHg.   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 Increased complex enhancing soft tissue nodularity in the low abdomen and pelvis, consistent  with worsening disease. 2. Increased size of the bilateral pulmonary nodules with a new 3 mm right upper lobe pulmonary nodule, consistent with worsening pulmonary metastatic disease. 3. Increased size of the retroperitoneal, mesenteric and iliac side chain lymph nodes, consistent with worsening nodal metastatic disease. 4. Small hiatal hernia with a patulous esophagus.     04/14/2024 -  Chemotherapy   Patient is on Treatment Plan : UTERINE Pembrolizumab (200) q21d      I discussed the assessment and treatment plan with the patient. The patient was provided an opportunity to ask questions and all were answered. The patient agreed with the plan and demonstrated an understanding of the instructions. The patient was advised to call back or seek an in-person evaluation if the symptoms worsen or if the condition fails to improve as anticipated.    I spent 20 minutes for the appointment reviewing test results, discuss  management and coordination of care.  Almeda Jacobs, MD 04/14/2024 9:39 AM

## 2024-04-14 NOTE — Assessment & Plan Note (Signed)
 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%, PDL-1 5%, p53 mutated, BRAF mutation not detected, SI stable, Low TMB, BRCA neg, Her2/neu neg, RET/NTRK not detected, ESR1 not detected Failed doxorubicin , progressed on gemcitabine  & taxotere , letrozole, Lupron  with tamoxifen  and trabectedin   Her insurance did not approve the use of pembrolizumab despite PD-L1 being 5% positive We have submitted paperwork to try to get compassionate release of drug from [employer] Alternative of pursuing pazopanib was discussed with the patient and ultimately, the patient has made informed decision to wait to see if she can get compassionate use of free drug

## 2024-04-14 NOTE — Telephone Encounter (Signed)
 Oral Oncology Patient Advocate Encounter  Prior Authorization for Pazopanib has been approved.    PA# [id number]  Effective dates: 04/14/24 through 04/14/25  Patient must fill through [facility] per insurance test claim.    Hansel Ley, CPhT Pharmacy Technician Coordinator [facility] [phone] (Ph) 04/14/2024 3:53 PM

## 2024-04-14 NOTE — Addendum Note (Signed)
 Addended byMarton Sleeper, Mikaiya Tramble on: 04/14/2024 03:21 PM   Modules accepted: Orders

## 2024-04-17 ENCOUNTER — Other Ambulatory Visit: Payer: Self-pay | Admitting: Hematology and Oncology

## 2024-04-17 MED ORDER — PAZOPANIB HCL 200 MG PO TABS: 800.0000 mg | ORAL_TABLET | Freq: Every day | ORAL | 11 refills | Status: DC

## 2024-04-17 NOTE — Telephone Encounter (Signed)
 Released RX to [employer]. Emailed Conda Deems. To let me know once it's processed and the ship date, as well as the copay.

## 2024-04-18 ENCOUNTER — Telehealth: Payer: Self-pay

## 2024-04-18 ENCOUNTER — Telehealth: Payer: Self-pay | Admitting: Pharmacist

## 2024-04-18 ENCOUNTER — Encounter: Payer: Self-pay | Admitting: Pharmacist

## 2024-04-18 NOTE — Telephone Encounter (Signed)
 Called and given below message to start Pazopanib  tomorrow. She verbalized understanding and received education today from [employer]. Appts scheduled on 6/12 and she is aware of appts.

## 2024-04-18 NOTE — Telephone Encounter (Signed)
 She called and left a message. Pazopanib  will be delivered tomorrow. She is asking when she should start.

## 2024-04-18 NOTE — Telephone Encounter (Signed)
 [facility]       Telephone: [phone]?Fax: [phone]   Oncology Clinical Pharmacist Practitioner Initial Assessment  Leah Diaz is a 63 y.o. female with a diagnosis of uterine sarcoma. They were contacted today via telephone visit.  I connected with Leah Diaz today by telephone and verified that I was speaking with the correct person using two patient identifiers. I discussed the limitations, risks, security and privacy concerns of performing an evaluation and management service by telemedicine and the availability of in-person appointments. The patient/caregiver expressed understanding and agreed to proceed.  Other persons participating in the visit and their role in the encounter: none   Patient's location: home  Provider's location: clinic   Indication/Regimen Pazopanib  (Votrient ) is being used appropriately for treatment of soft tissue sarcoma by Dr. Almeda Jacobs.      Wt Readings from Last 1 Encounters:  03/27/24 204 lb 3.2 oz (92.6 kg)    Estimated body surface area is 2.09 meters squared as calculated from the following:   Height as of 03/27/24: 5\' 7"  (1.702 m).   Weight as of 03/27/24: 204 lb 3.2 oz (92.6 kg).  The dosing regimen is 4 tablets (400 mg) by mouth daily on days 1 to 28 of a 28-day cycle. This is being given  monotherapy. It is planned to continue until disease progression or unacceptable toxicity. Prescription dose and frequency assessed for appropriateness.  Patient has agreed to treatment which is documented in physician note on 04/14/24. Counseled patient on administration, dosing, side effects, monitoring, drug-food interactions, safe handling, storage, and disposal.  [facility] will delivery tomorrow and she will start tomorrow or Friday. She knows to take on empty stomach.  Dose Modifications None at this time   Access Assessment Leah Diaz will be receiving pazopanib  through [facility] Insurance Concerns:  none Start date if known: tomorrow or Thursday  Adherence Assessment Reviewed importance on keeping a med schedule and plan for any missed doses Barriers to adherence identified? No  Allergies No Known Allergies  Vitals    03/27/2024    3:05 PM 03/10/2024   11:38 AM 03/09/2024    9:40 AM  Oncology Vitals  Height 170 cm    Weight 92.625 kg    Weight (lbs) 204 lbs 3 oz    BMI 31.98 kg/m2    Temp 99.8 F (37.7 C) 98.3 F (36.8 C)   Pulse Rate 127 106 97  BP 161/90 130/72   Resp 18 19   SpO2 98 % 98 % 99 %  BSA (m2) 2.09 m2       Laboratory Data    Latest Ref Rng & Units 03/09/2024    8:50 AM 02/17/2024    8:16 AM 01/27/2024   11:51 AM  CBC EXTENDED  WBC 4.0 - 10.5 K/uL 4.2  3.7  8.5   RBC 3.87 - 5.11 MIL/uL 3.53  3.73  3.63   Hemoglobin 12.0 - 15.0 g/dL 13.0  86.5  78.4   HCT 36.0 - 46.0 % 33.4  35.3  35.6   Platelets 150 - 400 K/uL 350  312  322   NEUT# 1.7 - 7.7 K/uL 1.7  1.3  3.5   Lymph# 0.7 - 4.0 K/uL 1.6  1.6  2.3        Latest Ref Rng & Units 03/09/2024    8:50 AM 02/17/2024    8:16 AM 01/27/2024   11:51 AM  CMP  Glucose 70 - 99 mg/dL 696  295  93   BUN 8 - 23 mg/dL 9  11  16    Creatinine 0.44 - 1.00 mg/dL 5.62  1.30  8.65   Sodium 135 - 145 mmol/L 139  139  137   Potassium 3.5 - 5.1 mmol/L 4.3  4.0  4.1   Chloride 98 - 111 mmol/L 103  105  103   CO2 22 - 32 mmol/L 32  27  29   Calcium 8.9 - 10.3 mg/dL 9.4  9.4  8.9   Total Protein 6.5 - 8.1 g/dL 6.4  6.5  6.7   Total Bilirubin 0.0 - 1.2 mg/dL 0.4  0.3  0.4   Alkaline Phos 38 - 126 U/L 111  117  94   AST 15 - 41 U/L 19  17  19    ALT 0 - 44 U/L 13  12  12     Lab Results  Component Value Date   MG 2.2 03/24/2023   MG 1.4 (L) 03/23/2023   MG 1.6 (L) 09/17/2022   No results found for: "CA2729"   Contraindications Contraindications were reviewed? Yes Contraindications to therapy were identified? No   Safety Precautions The following safety precautions for the use of pazopanib  were reviewed:  Fever:  reviewed the importance of having a thermometer and the Centers for Disease Control and Prevention (CDC) definition of fever which is 100.72F (38C) or higher. Patient should call 24/7 triage at [phone] if experiencing a fever or any other symptoms Bowel perforation Changes in electrolytes and other laboratory values Changes in kidney function Changes in liver function Decreased appetite or weight loss Decreased platelet count and increased risk for bleeding Decreased white blood cells (WBCs) and increased risk for infection Diarrhea (loose and/or urgent bowel movements) Fatigue Hair color changes Increased blood pressure Nausea or vomiting Pneumonitis Posterior reversible leukoencephalopathy syndrome (PRES) -- severe headache, seizures, confusion, changes in vision Problems with your thyroid QT or QTc prolongation Tumor lysis syndrome (TLS) risk VTE Take on an empty stomach one hour before or two hours after a meal or snack and at the same time each day Do not take the missed dose it if it within 12 hours or less before the next dose Do not take two doses at one time If you need to have surgery, tel your care provider you are taking pazopanib  as it may need to be stopped until your wound heals after some surgeries Avoid using acid-reducing agents such as proton pump inhibitors (omeprazole , esomeprazole, and histamine blockers (famotidine, etc.) while taking pazopanib , if possible. If taken with an antacid, separate pazopanib   from the antacid by several hours Avoid grapefruit products Heart failure Heart attack Stroke Impaired wound healing Missed doses Handing body fluids and waste Pregnancy, sexual activity, and contraception Storage and handling  Medication Reconciliation Current Outpatient Medications  Medication Sig Dispense Refill   acetaminophen  (TYLENOL ) 500 MG tablet Take 500 mg by mouth every 6 (six) hours as needed for mild pain, moderate pain or fever.      aspirin  81 MG EC tablet Take by mouth daily.     Cholecalciferol (VITAMIN D3 PO) Take 1 tablet by mouth daily.     furosemide  (LASIX ) 20 MG tablet Take 1 tablet (20 mg total) by mouth as needed for fluid or edema. Only takes 2-3 days per week 30 tablet 3   loratadine  (CLARITIN ) 10 MG tablet Take 10 mg by mouth daily.     LORazepam  (ATIVAN ) 0.5 MG tablet Take 1 tablet (0.5 mg total) by  mouth 2 (two) times daily as needed for anxiety. (Patient not taking: Reported on 04/18/2024) 30 tablet 0   losartan  (COZAAR ) 50 MG tablet TAKE 1 TABLET BY MOUTH EVERY DAY 90 tablet 1   metoprolol  tartrate (LOPRESSOR ) 25 MG tablet TAKE 1 TABLET BY MOUTH TWICE A DAY 180 tablet 1   Multiple Vitamin (MULTIVITAMIN WITH MINERALS) TABS tablet Take 1 tablet by mouth daily. 30 tablet 1   ondansetron  (ZOFRAN ) 8 MG tablet Take 1 tablet (8 mg total) by mouth every 8 (eight) hours as needed for nausea or vomiting. (Patient not taking: Reported on 04/18/2024) 30 tablet 1   pazopanib  (VOTRIENT ) 200 MG tablet Take 4 tablets (800 mg total) by mouth daily. Take on an empty stomach. (Patient not taking: Reported on 04/18/2024) 120 tablet 11   prochlorperazine  (COMPAZINE ) 10 MG tablet Take 1 tablet (10 mg total) by mouth every 6 (six) hours as needed for nausea or vomiting. (Patient not taking: Reported on 04/18/2024) 60 tablet 1   No current facility-administered medications for this visit.    Medication reconciliation is based on the patient's most recent medication list in the electronic medical record (EMR) including herbal products and OTC medications.   The patient's medication list was reviewed today with the patient? Yes   Drug-drug interactions (DDIs) DDIs were evaluated? Yes Significant DDIs identified? Discussed PPIs and removed omeprazole  for now. She will use Tums and separate it out from pazopanib  by several hours  Drug-Food Interactions Drug-food interactions were evaluated? Yes Drug-food interactions identified? Avoid  grapefruit products  Follow-up Plan  Patient education handout given to patient Start pazopanib  4 tablets (800 mg) by mouth daily. Take on empty stomach.  Monitor for toxicities. Labs will be ordered by Dr. Marton Sleeper. She will see her next on 04/27/24 Darrell Leonhardt can follow up with clinical pharmacy as deemed necessary by Dr. Almeda Jacobs going forward   Leah Diaz participated in the discussion, expressed understanding, and voiced agreement with the above plan. All questions were answered to their satisfaction. The patient was advised to contact the clinic at [phone] with any questions or concerns prior to their return visit.   I spent 30 minutes assessing the patient.  Dhyana Bastone A. Webb Hake, PharmD, BCOP, CPP  Althea Atkinson, RPH-CPP, 04/18/2024 4:37 PM  **Disclaimer: This note was dictated with voice recognition software. Similar sounding words can inadvertently be transcribed and this note may contain transcription errors which may not have been corrected upon publication of note.**

## 2024-04-18 NOTE — Telephone Encounter (Signed)
 Start tomorrow as soon as she received  Schedule return on 6/12 to see me with labs, flush, 20 mins appt

## 2024-04-20 ENCOUNTER — Other Ambulatory Visit

## 2024-04-20 ENCOUNTER — Ambulatory Visit

## 2024-04-20 ENCOUNTER — Ambulatory Visit: Admitting: Hematology and Oncology

## 2024-04-20 NOTE — Telephone Encounter (Signed)
 Received notification from [facility] that medication was delivered to patient yesterday, 04/19/24.    Hansel Ley, CPhT Pharmacy Technician Coordinator [facility] [phone] (Ph) 04/20/2024 8:26 AM

## 2024-04-21 ENCOUNTER — Encounter

## 2024-04-21 ENCOUNTER — Inpatient Hospital Stay

## 2024-04-27 ENCOUNTER — Inpatient Hospital Stay

## 2024-04-27 ENCOUNTER — Inpatient Hospital Stay: Attending: Gynecologic Oncology | Admitting: Hematology and Oncology

## 2024-04-27 ENCOUNTER — Encounter: Payer: Self-pay | Admitting: Hematology and Oncology

## 2024-04-27 VITALS — BP 155/92 | HR 74 | Temp 97.8°F | Resp 18 | Ht 67.0 in | Wt 194.6 lb

## 2024-04-27 DIAGNOSIS — C775 Secondary and unspecified malignant neoplasm of intrapelvic lymph nodes: Secondary | ICD-10-CM | POA: Diagnosis not present

## 2024-04-27 DIAGNOSIS — C55 Malignant neoplasm of uterus, part unspecified: Secondary | ICD-10-CM | POA: Insufficient documentation

## 2024-04-27 DIAGNOSIS — C786 Secondary malignant neoplasm of retroperitoneum and peritoneum: Secondary | ICD-10-CM | POA: Insufficient documentation

## 2024-04-27 DIAGNOSIS — C549 Malignant neoplasm of corpus uteri, unspecified: Secondary | ICD-10-CM | POA: Diagnosis not present

## 2024-04-27 DIAGNOSIS — K219 Gastro-esophageal reflux disease without esophagitis: Secondary | ICD-10-CM

## 2024-04-27 DIAGNOSIS — C7802 Secondary malignant neoplasm of left lung: Secondary | ICD-10-CM | POA: Insufficient documentation

## 2024-04-27 DIAGNOSIS — Z79899 Other long term (current) drug therapy: Secondary | ICD-10-CM | POA: Diagnosis not present

## 2024-04-27 DIAGNOSIS — C7801 Secondary malignant neoplasm of right lung: Secondary | ICD-10-CM | POA: Insufficient documentation

## 2024-04-27 LAB — CBC WITH DIFFERENTIAL/PLATELET
Abs Immature Granulocytes: 0.01 10*3/uL (ref 0.00–0.07)
Basophils Absolute: 0.1 10*3/uL (ref 0.0–0.1)
Basophils Relative: 2 %
Eosinophils Absolute: 1.3 10*3/uL — ABNORMAL HIGH (ref 0.0–0.5)
Eosinophils Relative: 24 %
HCT: 37.1 % (ref 36.0–46.0)
Hemoglobin: 13 g/dL (ref 12.0–15.0)
Immature Granulocytes: 0 %
Lymphocytes Relative: 29 %
Lymphs Abs: 1.6 10*3/uL (ref 0.7–4.0)
MCH: 32.6 pg (ref 26.0–34.0)
MCHC: 35 g/dL (ref 30.0–36.0)
MCV: 93 fL (ref 80.0–100.0)
Monocytes Absolute: 0.6 10*3/uL (ref 0.1–1.0)
Monocytes Relative: 11 %
Neutro Abs: 1.8 10*3/uL (ref 1.7–7.7)
Neutrophils Relative %: 34 %
Platelets: 264 10*3/uL (ref 150–400)
RBC: 3.99 MIL/uL (ref 3.87–5.11)
RDW: 14.6 % (ref 11.5–15.5)
WBC: 5.4 10*3/uL (ref 4.0–10.5)
nRBC: 0 % (ref 0.0–0.2)

## 2024-04-27 LAB — COMPREHENSIVE METABOLIC PANEL WITH GFR
ALT: 22 U/L (ref 0–44)
AST: 30 U/L (ref 15–41)
Albumin: 3.8 g/dL (ref 3.5–5.0)
Alkaline Phosphatase: 89 U/L (ref 38–126)
Anion gap: 8 (ref 5–15)
BUN: 12 mg/dL (ref 8–23)
CO2: 29 mmol/L (ref 22–32)
Calcium: 9.2 mg/dL (ref 8.9–10.3)
Chloride: 100 mmol/L (ref 98–111)
Creatinine, Ser: 0.72 mg/dL (ref 0.44–1.00)
GFR, Estimated: >60 mL/min (ref >60)
Glucose, Bld: 109 mg/dL — ABNORMAL HIGH (ref 70–99)
Potassium: 4.2 mmol/L (ref 3.5–5.1)
Sodium: 137 mmol/L (ref 135–145)
Total Bilirubin: 0.5 mg/dL (ref 0.0–1.2)
Total Protein: 6.6 g/dL (ref 6.5–8.1)

## 2024-04-27 LAB — TSH: TSH: 4.84 u[IU]/mL — ABNORMAL HIGH (ref 0.350–4.500)

## 2024-04-27 MED ORDER — OMEPRAZOLE 40 MG PO CPDR: 40.0000 mg | DELAYED_RELEASE_CAPSULE | Freq: Every day | ORAL | 1 refills | Status: DC

## 2024-04-27 MED ORDER — HEPARIN SOD (PORK) LOCK FLUSH 100 UNIT/ML IV SOLN
500.0000 [IU] | Freq: Once | INTRAVENOUS | Status: AC
  Administered 2024-04-27: 500 [IU]

## 2024-04-27 MED ORDER — SODIUM CHLORIDE 0.9% FLUSH
10.0000 mL | Freq: Once | INTRAVENOUS | Status: AC
  Administered 2024-04-27: 10 mL

## 2024-04-27 NOTE — Assessment & Plan Note (Addendum)
 She has symptoms of reflux I recommend that she change the timing of her aspirin  and we discussed the use of proton pump inhibitor away from the dose of her pazopanib 

## 2024-04-27 NOTE — Assessment & Plan Note (Addendum)
 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

## 2024-04-27 NOTE — Progress Notes (Signed)
 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%, PDL-1 5%, p53 mutated, BRAF mutation not detected, SI stable, Low TMB, BRCA neg, Her2/neu neg, RET/NTRK not detected, ESR1 not detected Failed doxorubicin , progressed on gemcitabine  & taxotere , letrozole, Lupron  with tamoxifen  and trabectedin   Her insurance did not approve the use of pembrolizumab despite PD-L1 being 5% positive After long discussion, we decided to put her on treatment with pazopanib  which she started taking on June 4th We will continue treatment for at least 3 months before repeating imaging study I will see her again in about 10 to 11 days for toxicity review Gastroesophageal reflux disease without esophagitis She has symptoms of reflux I recommend that she change the timing of her aspirin  and we discussed the use of proton pump inhibitor away from the dose of her pazopanib   No orders of the defined types were placed in this encounter.    Almeda Jacobs, MD  INTERVAL HISTORY: she returns for treatment follow-up Complications related to previous cycle of chemotherapy included elevated BP and stomach reflux Her blood pressure is mildly elevated but generally in the 140s She had 1 episode of headache recently She also have intermittent symptoms of reflux.  She takes aspirin  before bedtime Otherwise, her energy level is good  PHYSICAL EXAMINATION: ECOG PERFORMANCE STATUS: 1 - Symptomatic but completely ambulatory  No results found for: CAN125    Latest Ref Rng & Units  04/27/2024    8:09 AM 03/09/2024    8:50 AM 02/17/2024    8:16 AM  CBC  WBC 4.0 - 10.5 K/uL 5.4  4.2  3.7   Hemoglobin 12.0 - 15.0 g/dL 16.1  09.6  04.5   Hematocrit 36.0 - 46.0 % 37.1  33.4  35.3   Platelets 150 - 400 K/uL 264  350  312       Chemistry      Component Value Date/Time   NA 137 04/27/2024 0809   K 4.2 04/27/2024 0809   CL 100 04/27/2024 0809   CO2 29 04/27/2024 0809   BUN 12 04/27/2024 0809   CREATININE 0.72 04/27/2024 0809   CREATININE 0.65 03/09/2024 0850      Component Value Date/Time   CALCIUM 9.2 04/27/2024 0809   ALKPHOS 89 04/27/2024 0809   AST 30 04/27/2024 0809   AST 19 03/09/2024 0850   ALT 22 04/27/2024 0809   ALT 13 03/09/2024 0850   BILITOT 0.5 04/27/2024 0809   BILITOT 0.4 03/09/2024 0850       Vitals:   04/27/24 0853  BP: (!) 155/92  Pulse: 74  Resp: 18  Temp: 97.8 F (36.6 C)  SpO2: 96%   Filed Weights   04/27/24 0853  Weight: 194 lb 9.6 oz (88.3 kg)   Other relevant data reviewed during this visit included CBC, CMP and TSH

## 2024-05-05 ENCOUNTER — Telehealth: Payer: Self-pay

## 2024-05-05 NOTE — Telephone Encounter (Signed)
 Returned her call and rescheduled appts. She is aware of appts.

## 2024-05-09 ENCOUNTER — Inpatient Hospital Stay (HOSPITAL_BASED_OUTPATIENT_CLINIC_OR_DEPARTMENT_OTHER): Admitting: Hematology and Oncology

## 2024-05-09 ENCOUNTER — Inpatient Hospital Stay

## 2024-05-09 ENCOUNTER — Encounter: Payer: Self-pay | Admitting: Hematology and Oncology

## 2024-05-09 VITALS — BP 155/81 | HR 70 | Resp 18 | Ht 67.0 in | Wt 194.6 lb

## 2024-05-09 DIAGNOSIS — C549 Malignant neoplasm of corpus uteri, unspecified: Secondary | ICD-10-CM | POA: Diagnosis not present

## 2024-05-09 DIAGNOSIS — I10 Essential (primary) hypertension: Secondary | ICD-10-CM

## 2024-05-09 DIAGNOSIS — C55 Malignant neoplasm of uterus, part unspecified: Secondary | ICD-10-CM | POA: Diagnosis not present

## 2024-05-09 LAB — CBC WITH DIFFERENTIAL/PLATELET
Abs Immature Granulocytes: 0.01 10*3/uL (ref 0.00–0.07)
Basophils Absolute: 0 10*3/uL (ref 0.0–0.1)
Basophils Relative: 1 %
Eosinophils Absolute: 0.7 10*3/uL — ABNORMAL HIGH (ref 0.0–0.5)
Eosinophils Relative: 14 %
HCT: 36 % (ref 36.0–46.0)
Hemoglobin: 12.5 g/dL (ref 12.0–15.0)
Immature Granulocytes: 0 %
Lymphocytes Relative: 32 %
Lymphs Abs: 1.7 10*3/uL (ref 0.7–4.0)
MCH: 33.1 pg (ref 26.0–34.0)
MCHC: 34.7 g/dL (ref 30.0–36.0)
MCV: 95.2 fL (ref 80.0–100.0)
Monocytes Absolute: 0.5 10*3/uL (ref 0.1–1.0)
Monocytes Relative: 9 %
Neutro Abs: 2.4 10*3/uL (ref 1.7–7.7)
Neutrophils Relative %: 44 %
Platelets: 231 10*3/uL (ref 150–400)
RBC: 3.78 MIL/uL — ABNORMAL LOW (ref 3.87–5.11)
RDW: 15.7 % — ABNORMAL HIGH (ref 11.5–15.5)
WBC: 5.3 10*3/uL (ref 4.0–10.5)
nRBC: 0 % (ref 0.0–0.2)

## 2024-05-09 LAB — COMPREHENSIVE METABOLIC PANEL WITH GFR
ALT: 17 U/L (ref 0–44)
AST: 25 U/L (ref 15–41)
Albumin: 3.6 g/dL (ref 3.5–5.0)
Alkaline Phosphatase: 87 U/L (ref 38–126)
Anion gap: 5 (ref 5–15)
BUN: 11 mg/dL (ref 8–23)
CO2: 28 mmol/L (ref 22–32)
Calcium: 8.7 mg/dL — ABNORMAL LOW (ref 8.9–10.3)
Chloride: 104 mmol/L (ref 98–111)
Creatinine, Ser: 0.64 mg/dL (ref 0.44–1.00)
GFR, Estimated: >60 mL/min (ref >60)
Glucose, Bld: 80 mg/dL (ref 70–99)
Potassium: 4.3 mmol/L (ref 3.5–5.1)
Sodium: 137 mmol/L (ref 135–145)
Total Bilirubin: 0.5 mg/dL (ref 0.0–1.2)
Total Protein: 6.3 g/dL — ABNORMAL LOW (ref 6.5–8.1)

## 2024-05-09 MED ORDER — SODIUM CHLORIDE 0.9% FLUSH
10.0000 mL | Freq: Once | INTRAVENOUS | Status: AC
  Administered 2024-05-09: 10 mL

## 2024-05-09 MED ORDER — HEPARIN SOD (PORK) LOCK FLUSH 100 UNIT/ML IV SOLN
500.0000 [IU] | Freq: Once | INTRAVENOUS | Status: AC
  Administered 2024-05-09: 500 [IU]

## 2024-05-09 NOTE — Assessment & Plan Note (Addendum)
 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%, PDL-1 5%, p53 mutated, BRAF mutation not detected, SI stable, Low TMB, BRCA neg, Her2/neu neg, RET/NTRK not detected, ESR1 not detected Failed doxorubicin , progressed on gemcitabine  & taxotere , letrozole, Lupron  with tamoxifen  and trabectedin   Her insurance did not approve the use of pembrolizumab despite PD-L1 being 5% positive After long discussion, we decided to put her on treatment with pazopanib  which she started taking on June 4th She tolerated treatment well without major side effects I will space out her appointment to 1 month I plan to repeat imaging study first week of September

## 2024-05-09 NOTE — Assessment & Plan Note (Addendum)
 Her blood pressure is high today but normal at home Monitor closely

## 2024-05-09 NOTE — Progress Notes (Signed)
  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%, PDL-1 5%, p53 mutated, BRAF mutation not detected, SI stable, Low TMB, BRCA neg, Her2/neu neg, RET/NTRK not detected, ESR1 not detected Failed doxorubicin , progressed on gemcitabine  & taxotere , letrozole, Lupron  with tamoxifen  and trabectedin   Her insurance did not approve the use of pembrolizumab despite PD-L1 being 5% positive After long discussion, we decided to put her on treatment with pazopanib  which she started taking on June 4th She tolerated treatment well without major side effects I will space out her appointment to 1 month I plan to repeat imaging study first week of September  Essential hypertension Her blood pressure is high today but normal at home Monitor closely  No orders of the defined types were placed in this encounter.    Hicks Lonn, MD  INTERVAL HISTORY: she returns for treatment follow-up Complications related to previous cycle of chemotherapy included elevated BP and occasional symptoms of reflux She has no GI symptoms otherwise such as nausea, abdominal pain or bloating Her blood pressure control at home were normal  PHYSICAL EXAMINATION: ECOG PERFORMANCE STATUS: 0 - Asymptomatic  No results found for: [id number]    Latest Ref Rng & Units 05/09/2024   10:47 AM 04/27/2024    8:09 AM 03/09/2024    8:50 AM  CBC  WBC 4.0 - 10.5 K/uL 5.3  5.4  4.2   Hemoglobin 12.0 - 15.0 g/dL 87.4  86.9  88.4   Hematocrit 36.0 - 46.0 % 36.0  37.1  33.4   Platelets 150 - 400  K/uL 231  264  350       Chemistry      Component Value Date/Time   NA 137 05/09/2024 1047   K 4.3 05/09/2024 1047   CL 104 05/09/2024 1047   CO2 28 05/09/2024 1047   BUN 11 05/09/2024 1047   CREATININE 0.64 05/09/2024 1047   CREATININE 0.65 03/09/2024 0850      Component Value Date/Time   CALCIUM 8.7 (L) 05/09/2024 1047   ALKPHOS 87 05/09/2024 1047   AST 25 05/09/2024 1047   AST 19 03/09/2024 0850   ALT 17 05/09/2024 1047   ALT 13 03/09/2024 0850   BILITOT 0.5 05/09/2024 1047   BILITOT 0.4 03/09/2024 0850       Vitals:   05/09/24 1111  BP: (!) 155/81  Pulse: 70  Resp: 18  SpO2: 100%   Filed Weights   05/09/24 1111  Weight: 194 lb 9.6 oz (88.3 kg)   Other relevant data reviewed during this visit included CBC and CMP

## 2024-05-10 ENCOUNTER — Telehealth: Payer: Self-pay | Admitting: *Deleted

## 2024-05-10 NOTE — Telephone Encounter (Signed)
 Contacted Ms. Trueheart with requested changes in her schedule: Port Flush/Labs at [facility] 06/05/24 @ 2:15 pm Appt w/Dr. Lonn 06/06/2024 @ 1120 changed to phone appt (per Dr. Lonn request)  Patient states she's made a note of all times and locations

## 2024-05-12 ENCOUNTER — Telehealth: Payer: Self-pay

## 2024-05-12 NOTE — Telephone Encounter (Signed)
 Called and given below message. She verbalized understanding.

## 2024-05-12 NOTE — Telephone Encounter (Signed)
 She called and left a message. She had leg cramps x 3 episodes during the night. She used to take Tums for cramps but stopped with new medication.  She is asking what she should take for the leg cramps.

## 2024-05-12 NOTE — Telephone Encounter (Signed)
 She can take TUMS as needed and try OTC magnesium  supplement, any dose

## 2024-05-18 ENCOUNTER — Ambulatory Visit: Admitting: Hematology and Oncology

## 2024-05-18 ENCOUNTER — Other Ambulatory Visit

## 2024-05-19 ENCOUNTER — Other Ambulatory Visit: Payer: Self-pay | Admitting: Hematology and Oncology

## 2024-05-24 ENCOUNTER — Other Ambulatory Visit: Payer: Self-pay | Admitting: Hematology and Oncology

## 2024-05-25 ENCOUNTER — Encounter: Payer: Self-pay | Admitting: Hematology and Oncology

## 2024-06-05 ENCOUNTER — Inpatient Hospital Stay: Attending: Gynecologic Oncology

## 2024-06-05 ENCOUNTER — Encounter: Payer: Self-pay | Admitting: Hematology and Oncology

## 2024-06-05 DIAGNOSIS — Z90722 Acquired absence of ovaries, bilateral: Secondary | ICD-10-CM | POA: Insufficient documentation

## 2024-06-05 DIAGNOSIS — C775 Secondary and unspecified malignant neoplasm of intrapelvic lymph nodes: Secondary | ICD-10-CM | POA: Insufficient documentation

## 2024-06-05 DIAGNOSIS — C786 Secondary malignant neoplasm of retroperitoneum and peritoneum: Secondary | ICD-10-CM | POA: Insufficient documentation

## 2024-06-05 DIAGNOSIS — C7801 Secondary malignant neoplasm of right lung: Secondary | ICD-10-CM | POA: Insufficient documentation

## 2024-06-05 DIAGNOSIS — C549 Malignant neoplasm of corpus uteri, unspecified: Secondary | ICD-10-CM

## 2024-06-05 DIAGNOSIS — C55 Malignant neoplasm of uterus, part unspecified: Secondary | ICD-10-CM | POA: Insufficient documentation

## 2024-06-05 DIAGNOSIS — C7802 Secondary malignant neoplasm of left lung: Secondary | ICD-10-CM | POA: Insufficient documentation

## 2024-06-05 DIAGNOSIS — Z90710 Acquired absence of both cervix and uterus: Secondary | ICD-10-CM | POA: Diagnosis not present

## 2024-06-05 LAB — COMPREHENSIVE METABOLIC PANEL WITH GFR
ALT: 23 U/L (ref 0–44)
AST: 30 U/L (ref 15–41)
Albumin: 3 g/dL — ABNORMAL LOW (ref 3.5–5.0)
Alkaline Phosphatase: 82 U/L (ref 38–126)
Anion gap: 10 (ref 5–15)
BUN: 11 mg/dL (ref 8–23)
CO2: 24 mmol/L (ref 22–32)
Calcium: 8.6 mg/dL — ABNORMAL LOW (ref 8.9–10.3)
Chloride: 101 mmol/L (ref 98–111)
Creatinine, Ser: 0.54 mg/dL (ref 0.44–1.00)
GFR, Estimated: >60 mL/min (ref >60)
Glucose, Bld: 116 mg/dL — ABNORMAL HIGH (ref 70–99)
Potassium: 3.7 mmol/L (ref 3.5–5.1)
Sodium: 135 mmol/L (ref 135–145)
Total Bilirubin: 0.6 mg/dL (ref 0.0–1.2)
Total Protein: 6.1 g/dL — ABNORMAL LOW (ref 6.5–8.1)

## 2024-06-05 LAB — CBC WITH DIFFERENTIAL/PLATELET
Abs Immature Granulocytes: 0.01 K/uL (ref 0.00–0.07)
Basophils Absolute: 0 K/uL (ref 0.0–0.1)
Basophils Relative: 1 %
Eosinophils Absolute: 0.4 K/uL (ref 0.0–0.5)
Eosinophils Relative: 8 %
HCT: 35.6 % — ABNORMAL LOW (ref 36.0–46.0)
Hemoglobin: 12 g/dL (ref 12.0–15.0)
Immature Granulocytes: 0 %
Lymphocytes Relative: 38 %
Lymphs Abs: 2.1 K/uL (ref 0.7–4.0)
MCH: 33.6 pg (ref 26.0–34.0)
MCHC: 33.7 g/dL (ref 30.0–36.0)
MCV: 99.7 fL (ref 80.0–100.0)
Monocytes Absolute: 0.4 K/uL (ref 0.1–1.0)
Monocytes Relative: 8 %
Neutro Abs: 2.6 K/uL (ref 1.7–7.7)
Neutrophils Relative %: 45 %
Platelets: 296 K/uL (ref 150–400)
RBC: 3.57 MIL/uL — ABNORMAL LOW (ref 3.87–5.11)
RDW: 15.9 % — ABNORMAL HIGH (ref 11.5–15.5)
WBC: 5.6 K/uL (ref 4.0–10.5)
nRBC: 0 % (ref 0.0–0.2)

## 2024-06-05 LAB — TSH: TSH: 4.189 u[IU]/mL (ref 0.350–4.500)

## 2024-06-05 MED ORDER — SODIUM CHLORIDE 0.9% FLUSH
10.0000 mL | INTRAVENOUS | Status: AC
  Administered 2024-06-05: 10 mL

## 2024-06-05 MED ORDER — HEPARIN SOD (PORK) LOCK FLUSH 100 UNIT/ML IV SOLN
500.0000 [IU] | Freq: Once | INTRAVENOUS | Status: AC
  Administered 2024-06-05: 500 [IU] via INTRAVENOUS

## 2024-06-05 NOTE — Progress Notes (Signed)
 Leah Diaz presented for Portacath access and flush.  Portacath located right chest wall accessed with  H 20 needle.  Good blood return present. Portacath flushed with 20ml NS and 500U/36ml Heparin  and needle removed intact.  Procedure tolerated well and without incident. Treatment given today per MD orders. Discharged from clinic ambulatory in stable condition. Alert and oriented x 3. F/U with [facility] as scheduled.

## 2024-06-06 ENCOUNTER — Other Ambulatory Visit

## 2024-06-06 ENCOUNTER — Encounter: Payer: Self-pay | Admitting: Hematology and Oncology

## 2024-06-06 ENCOUNTER — Inpatient Hospital Stay (HOSPITAL_BASED_OUTPATIENT_CLINIC_OR_DEPARTMENT_OTHER): Attending: Gynecologic Oncology | Admitting: Hematology and Oncology

## 2024-06-06 DIAGNOSIS — C549 Malignant neoplasm of corpus uteri, unspecified: Secondary | ICD-10-CM

## 2024-06-06 NOTE — Assessment & Plan Note (Signed)
 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%, PDL-1 5%, p53 mutated, BRAF mutation not detected, SI stable, Low TMB, BRCA neg, Her2/neu neg, RET/NTRK not detected, ESR1 not detected Failed doxorubicin , progressed on gemcitabine  & taxotere , letrozole, Lupron  with tamoxifen  and trabectedin   Her insurance did not approve the use of pembrolizumab despite PD-L1 being 5% positive After long discussion, we decided to put her on treatment with pazopanib  which she started taking on June 4th She tolerated treatment well without major side effects I will space out her appointment to 1 month I plan to repeat imaging study first week of September

## 2024-06-06 NOTE — Progress Notes (Signed)
 HEMATOLOGY-ONCOLOGY ELECTRONIC VISIT PROGRESS NOTE  Patient Care Team: Lonn Hicks, MD as PCP - General (Hematology and Oncology) Stacia Diannah SQUIBB, MD as PCP - Cardiology (Cardiology) Viktoria Comer SAUNDERS, MD as Consulting Physician (Gynecologic Oncology) Starla Wendelyn BIRCH, RN as Registered Nurse  I connected with the patient via telephone conference and verified that I am speaking with the correct person using two identifiers. The patient's location is at home and I am providing care from the [facility] I discussed the limitations, risks, security and privacy concerns of performing an evaluation and management service by e-visits and the availability of in person appointments.  I also discussed with the patient that there may be a patient responsible charge related to this service. The patient expressed understanding and agreed to proceed.   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%, PDL-1 5%, p53 mutated, BRAF mutation not detected, SI stable, Low TMB, BRCA neg, Her2/neu neg, RET/NTRK not detected, ESR1 not detected Failed doxorubicin , progressed on gemcitabine  & taxotere , letrozole, Lupron  with tamoxifen  and trabectedin   Her insurance did not approve the use of pembrolizumab despite PD-L1 being 5% positive After long discussion, we decided to put her on treatment with pazopanib  which she started taking on June 4th She tolerated treatment well without major side effects I will space out her appointment to 1 month I plan to repeat imaging study first week of September   No orders of the defined types were placed in this encounter.   INTERVAL HISTORY: Please see below for problem oriented charting. The purpose of today's discussion is reviewed tox study while on pazopanib  She have no side effects We reviewed test  results and discussed future follow-up  SUMMARY OF ONCOLOGIC HISTORY: Oncology History Overview Note  High grade and LMS, 50% ER/PR positive dMMR normal, PD-L1 CPS 3% CARIS: ER60%, PR50%, PDL-1 5%, p53 mutated, BRAF mutation not detected, SI stable, Low TMB, BRCA neg, Her2/neu neg, RET/NTRK not detected, ESR1 not detected Failed doxorubicin , progressed on gemcitabine  & taxotere , letrozole, Lupron  with tamoxifen  and trabectedin    Uterine sarcoma (HCC)  06/02/2021 Imaging   MRI pelvis Heterogeneous 9.0 cm intrauterine mass within the intramural/submucosal anterior fundus demonstrating interval growth, internal enhancement, and restricted diffusion 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 No extra uterine extension. No evidence of metastatic disease within the pelvis.   Mild thickening of the endometrial stripe, likely related to entrapment by the adjacent uterine mass   06/03/2021 Initial Diagnosis   Uterine sarcoma (HCC)   06/03/2021 Cancer Staging   Staging form: Corpus Uteri - Sarcoma, AJCC 7th Edition - Clinical stage from 06/03/2021: FIGO Stage IVB (rT1b, N0, M1) - Signed by Lonn Hicks, MD on 03/31/2022 Diagnostic confirmation: Positive histology Stage prefix: Recurrence Biopsy of metastatic site performed: No Lymph-vascular invasion (LVI): LVI present/identified, NOS   06/03/2021 Pathology Results   FINAL MICROSCOPIC DIAGNOSIS:   A. UTERUS, CERVIX, BILATERAL FALLOPIAN TUBES AND OVARIES, HYSTERECTOMY:  - Uterus:       Mixed high grade uterine sarcoma, spanning 6 cm, see comment.       Extensive lymphovascular invasion.       See oncology table.  - Cervix: Benign squamous and endocervical mucosa. No dysplasia or  malignancy.  - Bilateral ovaries: Unremarkable. No malignancy.  - Bilateral fallopian tubes: Unremarkable. No malignancy.   ONCOLOGY TABLE:   UTERUS, SARCOMA:  Resection   Procedure: Total hysterectomy and bilateral  salpingo-oophorectomy  Specimen Integrity: Focally disrupted on posterior surface  Tumor Site: Anterior wall  Tumor Size: 6 cm  Histologic Type: High grade sarcoma, mixed. See comment.  Other Tissue/ Organ Involvement: Not identified  Lymphovascular Invasion: Present, extensive.  Margins: All margins negative for tumor  Regional Lymph Nodes: Not applicable (no lymph nodes submitted or found)  Distant Metastasis:       Distant Site(s) Involved: Not applicable  Pathologic Stage Classification (pTNM, AJCC 8th Edition): pT1b, pN not  assigned  Ancillary Studies: Can be performed upon request  Representative Tumor Block: A6  Comment(s): The tumor has two morphologically different components. One component consists of malignant spindle cells which can be seen arising from the smooth muscle. These foci are positive for SMA, desmin, cyclinD1 (focal), and CD10 (patchy). The other component consists of round to slightly spindle cells with abundant admixed vessels and occasional large pleomorphic cells. These areas are positive for SMA (patchy weak), desmin (focal), CD10 (variable with diffuse areas). Pancytokeratin is negative. 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   ADDENDUM:   PROGNOSTIC INDICATOR RESULTS:   Immunohistochemical and morphometric analysis performed manually    Estrogen Receptor:       POSITIVE, 50%, WEAK TO MODERATE STAINING  Progesterone Receptor:   POSITIVE, 50%, MODERATE TO STRONG STAINING   Reference Range Estrogen and Progesterone Receptor       Negative  0%       Positive  >1%    06/03/2021 Surgery   Surgeon: Eloy Maurilio Fitch    Assistants: Dr Olam Mill (an MD assistant was necessary for tissue manipulation, management of robotic instrumentation, retraction and positioning due to the complexity of the case and hospital policies).  Operation: Robotic-assisted laparoscopic total  hysterectomy >250gm with bilateral salpingoophorectomy, minilaparotomy for specimen delivery   Surgeon: Eloy Maurilio Fitch    Operative Findings:  : Bulky 16cm uterus with intra-uterine mass (consistent with a fibroid-like mass) , smooth normal appearing serosa, no suspicious bulky nodes. Normal ovaries bilaterally. Normal upper abdomen. Uterus too large to deliver vaginally in tact.    06/18/2021 Imaging   1. Multiple bilateral pulmonary nodules, measuring up to 9 mm. Most of these are perifissural and subpleural in location, likely lymph nodes. Nevertheless, close follow-up recommended to exclude metastatic disease. 2. 11 mm hypoattenuating lesion towards the dome of the liver, indeterminate. MRI abdomen with and without contrast could be used to further evaluate as clinically warranted. 3. Aortic Atherosclerosis (ICD10-I70.0).   10/13/2021 Imaging   1. No acute findings within the abdomen or pelvis. No specific findings identified to suggest residual or recurrence of tumor. 2. Stable small pulmonary nodules within the left lower lobe.   03/18/2022 Imaging   1. Interval development of 2 soft tissue nodules in the anterior pelvis measuring 10 mm and 7 mm respectively. Close follow-up recommended to exclude metastatic disease. PET-CT may be warranted to further evaluate. 2. Development of 2 mm left upper lobe parenchymal nodule, nonspecific. Close attention on follow-up imaging recommended  3. Stable appearance of index bilateral pulmonary nodules identified previously. 4. Aortic Atherosclerosis (ICD10-I70.0).   04/02/2022 PET scan   1. Signs of local tumor recurrence noted at the vaginal cuff. 2. 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 New since 10/13/2021 and progressive when compared with 03/17/2022. 3. New focal area of increased uptake within the right biceps femoris muscle which is  suspicious for skeletal muscle involvement. 4. Stable appearance  of small pulmonary nodules described on 03/17/2022. These are too small to characterize by PET-CT.   04/09/2022 Procedure   Successful placement of a right IJ approach Power Port with ultrasound and fluoroscopic guidance. The catheter is ready for use.   04/09/2022 Echocardiogram    1. Left ventricular ejection fraction, by estimation, is 65 to 70%. The left ventricle has normal function. The left ventricle has no regional wall motion abnormalities. There is mild concentric left ventricular hypertrophy. Left ventricular diastolic parameters are consistent with Grade I diastolic dysfunction (impaired relaxation).  2. Right ventricular systolic function is normal. The right ventricular size is normal.  3. The mitral valve is normal in structure. No evidence of mitral valve regurgitation.  4. The aortic valve is tricuspid. Aortic valve regurgitation is not visualized. No aortic stenosis is present.  5. The inferior vena cava is normal in size with greater than 50% respiratory variability, suggesting right atrial pressure of 3 mmHg.   04/16/2022 - 05/28/2022 Chemotherapy   Patient is on Treatment Plan : UTERINE LEIOMYOSARCOMA Doxorubicin  q21d x 6 Cycles     06/10/2022 Imaging   1. Status post hysterectomy and bilateral oophorectomy. Multiple newand enlarged peritoneal nodules throughout the low abdomen and pelvis, previously FDG avid and consistent with worsened locally recurrent and peritoneal metastatic disease. 2. Multiple small bilateral pulmonary nodules, several of which are slightly enlarged compared to prior examination, consistent with worsened pulmonary metastatic disease.   Aortic Atherosclerosis (ICD10-I70.0).     06/25/2022 - 03/04/2023 Chemotherapy   Patient is on Treatment Plan : UTERINE UNDIFFERENTIATED LEIOMYOSARCOMA Gemcitabine  D1,8 + Docetaxel  D8 (900/100) q21d     09/02/2022 Imaging   1. Moderate response to therapy of peritoneal metastasis within the anterior pelvis. 2. Minimal  response to therapy of pulmonary metastasis. 3. No new or progressive disease. 4.  Aortic Atherosclerosis (ICD10-I70.0).     12/01/2022 Imaging   1. No new or progressive findings in the chest, abdomen, or pelvis. 2. Peritoneal metastases in the pelvis described previously measure smaller today compatible with interval response to therapy. 3. Stable tiny bilateral pulmonary nodules. Previously characterized as metastatic lesions, these are unchanged in the interval. No new suspicious pulmonary nodule or mass. 4.  Aortic Atherosclerois (ICD10-170.0)   03/04/2023 Imaging   CT CHEST ABDOMEN PELVIS W CONTRAST  Result Date: 03/03/2023 CLINICAL DATA:  Cervical cancer * Tracking Code: BO *. Assess treatment response. Current chemotherapy. Prior total hysterectomy EXAM: CT CHEST, ABDOMEN, AND PELVIS WITH CONTRAST TECHNIQUE: Multidetector CT imaging of the chest, abdomen and pelvis was performed following the standard protocol during bolus administration of intravenous contrast. RADIATION DOSE REDUCTION: This exam was performed according to the departmental dose-optimization program which includes automated exposure control, adjustment of the mA and/or kV according to patient size and/or use of iterative reconstruction technique. CONTRAST:  OMNIPAQUE  IOHEXOL  300 MG/ML  SOLN COMPARISON:  CT 12/01/2022 and older FINDINGS: CT CHEST FINDINGS Cardiovascular: Right upper chest port. Heart is nonenlarged. Trace pericardial fluid. Normal caliber thoracic aorta. Mediastinum/Nodes: No specific abnormal lymph node enlargement seen in the axillary region, hilum or mediastinum. Mildly patulous thoracic esophagus. Thyroid  gland is unremarkable. Lungs/Pleura: Tiny pleural effusions are seen, left-greater-than-right. These new from previous. There is some patchy parenchymal opacity identified in the superior segment of the lower lobes, left-greater-than-right. This could be infiltrative rather than neoplastic. Recommend  short follow-up. There were some small nodules identified on the prior. For example 3 mm nodule lateral  left lower lobe subpleural on series 7, image 91 is stable. Right lower lobe subpleural nodule on the prior which measured 9 x 6 mm, today is stable on image 73 of series 7. Stable 2-3 mm nodule right lower lobe on series 7, image 97. Musculoskeletal: Curvature of the spine with some degenerative changes. CT ABDOMEN PELVIS FINDINGS Hepatobiliary: Stable dome hepatic cyst. No new space-occupying lesion in the liver. Patent portal vein. Gallbladder is distended. Pancreas: Unremarkable. No pancreatic ductal dilatation or surrounding inflammatory changes. Spleen: Spleen is slightly enlarged at 13.1 cm in AP length. Preserved enhancement. Small splenule inferiorly. Adrenals/Urinary Tract: The adrenal glands are preserved. Tiny low-attenuation cystic lesions are seen which are too small to completely characterize along the kidneys. Bosniak 2 lesions. No specific imaging follow-up. The ureters have normal course and caliber down to the bladder. Bladder wall is diffusely thickened. The bladder is contracted. There is significant stranding. This is progressive from the previous examination. Stomach/Bowel: Large bowel has a normal course and caliber with scattered stool. Overall moderate stool burden. Normal appendix extends inferior to the cecum in the right lower quadrant. The stomach and small bowel are nondilated. Vascular/Lymphatic: Normal caliber aorta and IVC with some vascular calcifications. No specific abnormal lymph node enlargement identified in the abdomen and pelvis. Reproductive: Absence of the uterus and ovaries. Other: Once again there is a pelvic soft tissue nodule anteriorly on series 2, image 104 which on the prior measured 2.1 x 1.6 cm and today 1.9 x 1.3 cm. Smaller foci more caudal on axial image 111 and 112 are stable. Musculoskeletal: Curvature and degenerative changes are seen along the spine.  Degenerative changes along the pelvis. IMPRESSION: Stable pelvic peritoneal nodules. No new lymph node enlargement seen in the chest, abdomen or pelvis. New tiny pleural effusions, left-greater-than-right with some ill-defined parenchymal opacities along the lower lobes. Although there is a differential this could be infectious or inflammatory. Recommend short follow-up and correlation to specific symptoms. Previous tiny lung nodules are stable. Continued follow-up surveillance as per the patient's neoplasm. Mild splenomegaly. Worsening bladder wall thickening and stranding. Please correlate with symptoms and treatment Electronically Signed   By: Ranell Bring M.D.   On: 03/03/2023 18:23      04/05/2023 Imaging   Normal bone density scan   06/21/2023 Imaging   CT CHEST ABDOMEN PELVIS W CONTRAST  Result Date: 06/20/2023 CLINICAL DATA:  Endometrial cancer restaging, chemotherapy complete, ongoing oral chemotherapy * Tracking Code: BO * EXAM: CT CHEST, ABDOMEN, AND PELVIS WITH CONTRAST TECHNIQUE: Multidetector CT imaging of the chest, abdomen and pelvis was performed following the standard protocol during bolus administration of intravenous contrast. RADIATION DOSE REDUCTION: This exam was performed according to the departmental dose-optimization program which includes automated exposure control, adjustment of the mA and/or kV according to patient size and/or use of iterative reconstruction technique. CONTRAST:  OMNIPAQUE  IOHEXOL  300 MG/ML  SOLN COMPARISON:  CT chest angiogram, 03/23/2023, CT chest abdomen pelvis, 03/02/2023 FINDINGS: CT CHEST FINDINGS Cardiovascular: No significant vascular findings. Normal heart size. No pericardial effusion. Mediastinum/Nodes: No enlarged mediastinal, hilar, or axillary lymph nodes. Thyroid  gland, trachea, and esophagus demonstrate no significant findings. Lungs/Pleura: Interval resolution of previously seen pleural effusions. Significant interval improvement of previously  seen heterogeneous and ground-glass airspace opacity, with small residual opacities present in the lower lobes (series 4, image 68). Multiple small subpleural nodules are unchanged, largest in the dependent right lower lobe measuring 0.9 cm (series 4, image 75). Musculoskeletal: No chest wall abnormality. No  acute osseous findings. CT ABDOMEN PELVIS FINDINGS Hepatobiliary: No solid liver abnormality is seen. Benign simple cyst of the liver dome, requiring no further follow-up or characterization (series 2, image 45). No gallstones, gallbladder wall thickening, or biliary dilatation. Pancreas: Unremarkable. No pancreatic ductal dilatation or surrounding inflammatory changes. Spleen: Normal in size without significant abnormality. Adrenals/Urinary Tract: Adrenal glands are unremarkable. Simple, benign right renal cortical cysts, for which no further follow-up or characterization is required. Kidneys are otherwise normal, without renal calculi, solid lesion, or hydronephrosis. Bladder is unremarkable. Stomach/Bowel: Stomach is within normal limits. Appendix appears normal. No evidence of bowel wall thickening, distention, or inflammatory changes. Moderate burden of stool throughout the colon and rectum. Vascular/Lymphatic: No significant vascular findings are present. No enlarged abdominal or pelvic lymph nodes. Reproductive: Status post hysterectomy. Other: No abdominal wall hernia or abnormality. No ascites. Largest soft tissue nodule in the low midline abdomen is increased in size, measuring 2.8 x 2.5 cm, previously 2.4 x 2.1 cm (series 2, image 105). Multiple additional soft tissue nodules throughout the pelvis are unchanged, largest in the anterior right pelvis measuring 1.6 x 1.2 cm (series 2, image 112). Additional nodule at the right aspect of the vaginal cuff measures 2.0 x 1.0 cm (series 2, image 112). Musculoskeletal: No acute osseous findings. IMPRESSION: 1. Largest peritoneal soft tissue nodule in the low  midline abdomen is increased in size, consistent with worsened peritoneal metastatic disease. Multiple additional soft tissue nodules throughout the pelvis are unchanged. 2. 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 3. Interval resolution of previously seen pleural effusions. 4. Significant interval improvement of previously seen heterogeneous and ground-glass airspace opacity, with small residual opacities present in the lower lobes. Findings are consistent with improved nonspecific infection or inflammation. 5. Status post hysterectomy. Electronically Signed   By: Marolyn JONETTA Jaksch M.D.   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 * Tracking Code: BO * EXAM: CT CHEST, ABDOMEN, AND PELVIS WITH CONTRAST TECHNIQUE: Multidetector CT imaging of the chest, abdomen and pelvis was performed following the standard protocol during bolus administration of intravenous contrast. RADIATION DOSE REDUCTION: This exam was performed according to the departmental dose-optimization program which includes automated exposure control, adjustment of the mA and/or kV according to patient size and/or use of iterative reconstruction technique. CONTRAST:  OMNIPAQUE  IOHEXOL  300 MG/ML  SOLN COMPARISON:  Multiple priors including most recent CT June 18, 2023 FINDINGS: CT CHEST FINDINGS Cardiovascular: Aortic atherosclerosis. Accessed right chest Port-A-Cath with tip at the superior cavoatrial junction. No central pulmonary embolus on this nondedicated study. Normal size heart. No significant pericardial effusion/thickening. Mediastinum/Nodes: No suspicious thyroid  nodule. No pathologically enlarged mediastinal, hilar or axillary lymph nodes. Small hiatal hernia. Lungs/Pleura: Bilateral solid pulmonary nodules, some of which are stable in size others are increased in size. For  reference: -left upper lobe pulmonary nodule measuring 6 mm on image 77/2 previously measured 3 mm. -subpleural right lower lobe pulmonary nodule measures 9 mm on image 77/7, unchanged. A few new scattered tiny pulmonary nodules for instance a 3 mm pulmonary nodule in the right lower lobe on image 87/7. Musculoskeletal: No aggressive lytic or blastic lesion of bone. Multilevel degenerative changes spine. CT ABDOMEN PELVIS FINDINGS Hepatobiliary: Stable 9 mm hypodense lesion in the dome of the liver previously characterized as a cyst on MRI July 04, 2021. No suspicious hepatic lesion. Gallbladder  is unremarkable. No biliary ductal dilation. Pancreas: No pancreatic ductal dilation or evidence of acute inflammation. Spleen: No splenomegaly. Adrenals/Urinary Tract: Bilateral adrenal glands appear normal. No hydronephrosis. Stomach/Bowel: No radiopaque enteric contrast material was administered. Stomach is unremarkable for degree of distension. Normal appendix. No pathologic dilation of small or large bowel. Vascular/Lymphatic: Aortic atherosclerosis. Normal caliber abdominal aorta. Smooth IVC contours. The portal, splenic and superior mesenteric veins are patent. No pathologically enlarged abdominal or pelvic lymph nodes. Reproductive: Prior hysterectomy with increased size of the vaginal cuff nodules previously indexed nodule in the right vaginal cuff measures 3.2 cm on image 116/2 previously 2.0 cm. Other: Increased size and number of peritoneal/omental soft tissue implants. For reference: -previously indexed soft tissue nodule in the anterior pelvis now measures 5.7 x 3.6 cm on image 105/2 previously 2.8 x 2.5 cm. -new pelvic soft tissue nodule measures 2.3 cm on image 106/2. Trace free fluid in the pelvis with a new walled-off pelvic fluid collection measuring 3.9 cm adjacent to a soft tissue implant on image 105/2. Musculoskeletal: No aggressive lytic or blastic lesion of bone. IMPRESSION: 1. Worsening peritoneal  carcinomatosis, increased size of the vaginal cuff nodules, and increased size/of bilateral solid pulmonary nodules, consistent with worsening metastatic disease. 2. Aortic Atherosclerosis (ICD10-I70.0). Electronically Signed   By: Reyes Holder M.D.   On: 09/19/2023 12:38      10/05/2023 - 12/30/2023 Chemotherapy   Patient is on Treatment Plan : UTERINE UNDIFFERENTIATED LEIOMYOSARCOMA Gemcitabine  D1,8 + Docetaxel  D8 (900/100) q21d     01/10/2024 Imaging   CT CHEST ABDOMEN PELVIS W CONTRAST Result Date: 01/13/2024 CLINICAL DATA:  Musculoskeletal neoplasm, assess treatment response uterine sarcoma assess response to chemo. * Tracking Code: BO * EXAM: CT CHEST, ABDOMEN, AND PELVIS WITH CONTRAST TECHNIQUE: Multidetector CT imaging of the chest, abdomen and pelvis was performed following the standard protocol during bolus administration of intravenous contrast. RADIATION DOSE REDUCTION: This exam was performed according to the departmental dose-optimization program which includes automated exposure control, adjustment of the mA and/or kV according to patient size and/or use of iterative reconstruction technique. CONTRAST:  100mL ISOVUE -300 IOPAMIDOL  (ISOVUE -300) INJECTION 61% COMPARISON:  CT scan chest, abdomen and pelvis from 09/17/2023. FINDINGS: CT CHEST FINDINGS Cardiovascular: Normal cardiac size. No pericardial effusion. No aortic aneurysm. Mediastinum/Nodes: Visualized thyroid  gland appears grossly unremarkable. No solid / cystic mediastinal masses. The esophagus is nondistended precluding optimal assessment. No axillary, mediastinal or hilar lymphadenopathy by size criteria. Lungs/Pleura: The central tracheo-bronchial tree is patent. Redemonstration of multiple (more than 20), solid bilateral lung nodules with largest in the right lung lower lobe measuring 8.2 x 9.5 mm (previously 6.7 x 7.5 mm) and largest in the left lung inferior lingula measuring 6.9 x 7.7 mm (previously 5.2 x 5.8 mm). No new mass or  consolidation. No pleural effusion or pneumothorax. Musculoskeletal: A CT Port-a-Cath is seen in the right upper chest wall with the catheter terminating in the lower portion of superior vena cava. Visualized soft tissues of the chest wall are otherwise grossly unremarkable. No suspicious osseous lesions. There are mild multilevel degenerative changes in the visualized spine. CT ABDOMEN PELVIS FINDINGS Hepatobiliary: The liver is normal in size. Non-cirrhotic configuration. There is stable cyst in the right hepatic lobe. Mild diffuse hepatic steatosis. No suspicious liver lesion. No intrahepatic or extrahepatic bile duct dilation. No calcified gallstones. Normal gallbladder wall thickness. No pericholecystic inflammatory changes. Pancreas: Unremarkable. No pancreatic ductal dilatation or surrounding inflammatory changes. Spleen: Within normal limits. No focal lesion. Adrenals/Urinary Tract: Adrenal  glands are unremarkable. No suspicious renal mass. There are stable subcentimeter partially exophytic simple cyst in the right kidney lower pole, medially. No nephroureterolithiasis or obstructive uropathy. Urinary bladder is under distended, precluding optimal assessment. However, no large mass or stones identified. No perivesical fat stranding. Redemonstration of prominent perivesical vessels, similar to the prior study. Stomach/Bowel: No disproportionate dilation of the small or large bowel loops. No evidence of abnormal bowel wall thickening or inflammatory changes. The appendix is unremarkable. Vascular/Lymphatic: No ascites or pneumoperitoneum. Redemonstration of multiple enlarged, heterogeneous, hyperattenuating tumor deposits mainly in the pelvis. There is significant interval increase in the size and enhancement when compared to the prior exam. Largest such deposit in the lower pelvis, anteriorly measures 6.6 x 7.9 cm on today's exam which measured up to 3.6 x 5.7 cm on the prior exam. No abdominal or pelvic  lymphadenopathy, by size criteria. No aneurysmal dilation of the major abdominal arteries. There are mild peripheral atherosclerotic vascular calcifications of the aorta and its major branches. Reproductive: The uterus is surgically absent. There are multiple tumor deposits surrounding the vaginal cuff and in the pelvis, as described above. Other: The visualized soft tissues and abdominal wall are unremarkable. Musculoskeletal: No suspicious osseous lesions. There are mild multilevel degenerative changes in the visualized spine. IMPRESSION: 1. Overall, worsening metastatic disease. 2. Interval increase in the size of multiple bilateral solid lung nodules, as described in detail above. 3. Redemonstration of multiple enlarged, heterogeneous, hyperattenuating tumor deposits mainly in the pelvis. There is significant interval increase in the size and enhancement when compared to the prior exam. 4. No new metastatic disease identified within the chest, abdomen or pelvis. 5. Multiple other nonacute observations, as described above. Aortic Atherosclerosis (ICD10-I70.0). Electronically Signed   By: Ree Molt M.D.   On: 01/13/2024 10:41      01/26/2024 Echocardiogram     1. Left ventricular ejection fraction, by estimation, is 60 to 65%. The left ventricle has normal function. The left ventricle has no regional wall motion abnormalities. There is mild left ventricular hypertrophy. Left ventricular diastolic parameters  are consistent with Grade I diastolic dysfunction (impaired relaxation).  2. Right ventricular systolic function is normal. The right ventricular size is normal. Tricuspid regurgitation signal is inadequate for assessing PA pressure.  3. The mitral valve is normal in structure. No evidence of mitral valve regurgitation. No evidence of mitral stenosis.  4. The aortic valve is tricuspid. There is mild calcification of the aortic valve. There is mild thickening of the aortic valve. Aortic valve  regurgitation is not visualized. No aortic stenosis is present.  5. The inferior vena cava is normal in size with greater than 50% respiratory variability, suggesting right atrial pressure of 3 mmHg.   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 Increased complex enhancing soft tissue nodularity in the low abdomen and pelvis, consistent with worsening disease. 2. Increased size of the bilateral pulmonary nodules with a new 3 mm right upper lobe pulmonary nodule, consistent with worsening pulmonary metastatic disease. 3. Increased size of the retroperitoneal, mesenteric and iliac side chain lymph nodes, consistent with worsening nodal metastatic disease. 4. Small hiatal hernia with a patulous esophagus.     04/19/2024 -  Chemotherapy   She started taking pazopanib     I discussed the assessment and treatment plan with the patient. The patient was provided an opportunity to ask questions and all  were answered. The patient agreed with the plan and demonstrated an understanding of the instructions. The patient was advised to call back or seek an in-person evaluation if the symptoms worsen or if the condition fails to improve as anticipated.    I spent 20 minutes for the appointment reviewing test results, discuss management and coordination of care.  Almarie Bedford, MD 06/06/2024 12:02 PM

## 2024-06-28 ENCOUNTER — Other Ambulatory Visit: Payer: Self-pay | Admitting: Hematology and Oncology

## 2024-07-01 ENCOUNTER — Encounter: Payer: Self-pay | Admitting: Hematology and Oncology

## 2024-07-06 ENCOUNTER — Inpatient Hospital Stay: Attending: Gynecologic Oncology

## 2024-07-06 DIAGNOSIS — Z79899 Other long term (current) drug therapy: Secondary | ICD-10-CM | POA: Insufficient documentation

## 2024-07-06 DIAGNOSIS — C775 Secondary and unspecified malignant neoplasm of intrapelvic lymph nodes: Secondary | ICD-10-CM | POA: Diagnosis not present

## 2024-07-06 DIAGNOSIS — R110 Nausea: Secondary | ICD-10-CM | POA: Diagnosis not present

## 2024-07-06 DIAGNOSIS — Z1721 Progesterone receptor positive status: Secondary | ICD-10-CM | POA: Insufficient documentation

## 2024-07-06 DIAGNOSIS — C549 Malignant neoplasm of corpus uteri, unspecified: Secondary | ICD-10-CM

## 2024-07-06 DIAGNOSIS — C55 Malignant neoplasm of uterus, part unspecified: Secondary | ICD-10-CM | POA: Insufficient documentation

## 2024-07-06 DIAGNOSIS — Z170 Estrogen receptor positive status [ER+]: Secondary | ICD-10-CM | POA: Diagnosis not present

## 2024-07-06 DIAGNOSIS — Z9221 Personal history of antineoplastic chemotherapy: Secondary | ICD-10-CM | POA: Insufficient documentation

## 2024-07-06 DIAGNOSIS — C7802 Secondary malignant neoplasm of left lung: Secondary | ICD-10-CM | POA: Diagnosis not present

## 2024-07-06 DIAGNOSIS — C7801 Secondary malignant neoplasm of right lung: Secondary | ICD-10-CM | POA: Diagnosis not present

## 2024-07-06 DIAGNOSIS — C786 Secondary malignant neoplasm of retroperitoneum and peritoneum: Secondary | ICD-10-CM | POA: Diagnosis not present

## 2024-07-06 LAB — CBC WITH DIFFERENTIAL/PLATELET
Abs Immature Granulocytes: 0.01 K/uL (ref 0.00–0.07)
Basophils Absolute: 0 K/uL (ref 0.0–0.1)
Basophils Relative: 1 %
Eosinophils Absolute: 0.3 K/uL (ref 0.0–0.5)
Eosinophils Relative: 7 %
HCT: 32.9 % — ABNORMAL LOW (ref 36.0–46.0)
Hemoglobin: 10.8 g/dL — ABNORMAL LOW (ref 12.0–15.0)
Immature Granulocytes: 0 %
Lymphocytes Relative: 37 %
Lymphs Abs: 1.8 K/uL (ref 0.7–4.0)
MCH: 34.1 pg — ABNORMAL HIGH (ref 26.0–34.0)
MCHC: 32.8 g/dL (ref 30.0–36.0)
MCV: 103.8 fL — ABNORMAL HIGH (ref 80.0–100.0)
Monocytes Absolute: 0.5 K/uL (ref 0.1–1.0)
Monocytes Relative: 10 %
Neutro Abs: 2.3 K/uL (ref 1.7–7.7)
Neutrophils Relative %: 45 %
Platelets: 346 K/uL (ref 150–400)
RBC: 3.17 MIL/uL — ABNORMAL LOW (ref 3.87–5.11)
RDW: 16.6 % — ABNORMAL HIGH (ref 11.5–15.5)
WBC: 5 K/uL (ref 4.0–10.5)
nRBC: 0 % (ref 0.0–0.2)

## 2024-07-06 LAB — COMPREHENSIVE METABOLIC PANEL WITH GFR
ALT: 19 U/L (ref 0–44)
AST: 28 U/L (ref 15–41)
Albumin: 3 g/dL — ABNORMAL LOW (ref 3.5–5.0)
Alkaline Phosphatase: 67 U/L (ref 38–126)
Anion gap: 12 (ref 5–15)
BUN: 14 mg/dL (ref 8–23)
CO2: 24 mmol/L (ref 22–32)
Calcium: 8.4 mg/dL — ABNORMAL LOW (ref 8.9–10.3)
Chloride: 100 mmol/L (ref 98–111)
Creatinine, Ser: 0.56 mg/dL (ref 0.44–1.00)
GFR, Estimated: >60 mL/min (ref >60)
Glucose, Bld: 107 mg/dL — ABNORMAL HIGH (ref 70–99)
Potassium: 4.1 mmol/L (ref 3.5–5.1)
Sodium: 136 mmol/L (ref 135–145)
Total Bilirubin: 0.6 mg/dL (ref 0.0–1.2)
Total Protein: 5.9 g/dL — ABNORMAL LOW (ref 6.5–8.1)

## 2024-07-06 LAB — TSH: TSH: 5.399 u[IU]/mL — ABNORMAL HIGH (ref 0.350–4.500)

## 2024-07-06 NOTE — Progress Notes (Signed)
 Port flushed with good blood return noted. No bruising or swelling at site. Bandaid applied and patient discharged in satisfactory condition. VVS stable with no signs or symptoms of distressed noted.

## 2024-07-06 NOTE — Patient Instructions (Signed)
 [facility] [place] - A DEPT OF [facility]  Discharge Instructions: Thank you for choosing [facility] to provide your oncology and hematology care.  If you have a lab appointment with the Cancer Center - please note that after April 8th, 2024, all labs will be drawn in the cancer center.  You do not have to check in or register with the main entrance as you have in the past but will complete your check-in in the cancer center.  Wear comfortable clothing and clothing appropriate for easy access to any Portacath or PICC line.   We strive to give you quality time with your provider. You may need to reschedule your appointment if you arrive late (15 or more minutes).  Arriving late affects you and other patients whose appointments are after yours.  Also, if you miss three or more appointments without notifying the office, you may be dismissed from the clinic at the provider's discretion.      For prescription refill requests, have your pharmacy contact our office and allow 72 hours for refills to be completed.    Today you received the following port flush with labs, returns as scheduled.   To help prevent nausea and vomiting after your treatment, we encourage you to take your nausea medication as directed.  BELOW ARE SYMPTOMS THAT SHOULD BE REPORTED IMMEDIATELY: *FEVER GREATER THAN 100.4 F (38 C) OR HIGHER *CHILLS OR SWEATING *NAUSEA AND VOMITING THAT IS NOT CONTROLLED WITH YOUR NAUSEA MEDICATION *UNUSUAL SHORTNESS OF BREATH *UNUSUAL BRUISING OR BLEEDING *URINARY PROBLEMS (pain or burning when urinating, or frequent urination) *BOWEL PROBLEMS (unusual diarrhea, constipation, pain near the anus) TENDERNESS IN MOUTH AND THROAT WITH OR WITHOUT PRESENCE OF ULCERS (sore throat, sores in mouth, or a toothache) UNUSUAL RASH, SWELLING OR PAIN  UNUSUAL VAGINAL DISCHARGE OR ITCHING   Items with * indicate a potential emergency and should be followed up as soon  as possible or go to the Emergency Department if any problems should occur.  Please show the CHEMOTHERAPY ALERT CARD or IMMUNOTHERAPY ALERT CARD at check-in to the Emergency Department and triage nurse.  Should you have questions after your visit or need to cancel or reschedule your appointment, please contact [facility] [place] - A DEPT OF [facility] [phone]  and follow the prompts.  Office hours are 8:00 a.m. to 4:30 p.m. Monday - Friday. Please note that voicemails left after 4:00 p.m. may not be returned until the following business day.  We are closed weekends and major holidays. You have access to a nurse at all times for urgent questions. Please call the main number to the clinic [phone] and follow the prompts.  For any non-urgent questions, you may also contact your provider using MyChart. We now offer e-Visits for anyone 31 and older to request care online for non-urgent symptoms. For details visit [email or web address].   Also download the MyChart app! Go to the app store, search MyChart, open the app, select [facility], and log in with your MyChart username and password.

## 2024-07-07 ENCOUNTER — Encounter: Payer: Self-pay | Admitting: Hematology and Oncology

## 2024-07-07 ENCOUNTER — Inpatient Hospital Stay (HOSPITAL_BASED_OUTPATIENT_CLINIC_OR_DEPARTMENT_OTHER): Admitting: Hematology and Oncology

## 2024-07-07 ENCOUNTER — Telehealth: Payer: Self-pay

## 2024-07-07 DIAGNOSIS — C549 Malignant neoplasm of corpus uteri, unspecified: Secondary | ICD-10-CM

## 2024-07-07 DIAGNOSIS — R110 Nausea: Secondary | ICD-10-CM | POA: Diagnosis not present

## 2024-07-07 MED ORDER — ONDANSETRON HCL 8 MG PO TABS
8.0000 mg | ORAL_TABLET | Freq: Three times a day (TID) | ORAL | 1 refills | Status: AC | PRN
Start: 2024-07-07 — End: ?

## 2024-07-07 NOTE — Telephone Encounter (Signed)
 LVM requesting for patient to return call to clinic to review newly scheduled appointments and scan prep information. Provided callback number.

## 2024-07-07 NOTE — Assessment & Plan Note (Signed)
 She will continue antiemetics as needed I refilled her prescription of Zofran 

## 2024-07-07 NOTE — Progress Notes (Signed)
 HEMATOLOGY-ONCOLOGY ELECTRONIC VISIT PROGRESS NOTE  Patient Care Team: Lonn Hicks, MD as PCP - General (Hematology and Oncology) Stacia Diannah SQUIBB, MD as PCP - Cardiology (Cardiology) Viktoria Comer SAUNDERS, MD as Consulting Physician (Gynecologic Oncology) Starla Wendelyn BIRCH, RN as Registered Nurse  I connected with the patient via telephone conference and verified that I am speaking with the correct person using two identifiers. The patient's location is at home and I am providing care from the [facility] I discussed the limitations, risks, security and privacy concerns of performing an evaluation and management service by e-visits and the availability of in person appointments.  I also discussed with the patient that there may be a patient responsible charge related to this service. The patient expressed understanding and agreed to proceed.   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%, PDL-1 5%, p53 mutated, BRAF mutation not detected, SI stable, Low TMB, BRCA neg, Her2/neu neg, RET/NTRK not detected, ESR1 not detected Failed doxorubicin , progressed on gemcitabine  & taxotere , letrozole, Lupron  with tamoxifen  and trabectedin   Her insurance did not approve the use of pembrolizumab despite PD-L1 being 5% positive After long discussion, we decided to put her on treatment with pazopanib  which she started taking on June 4th She tolerated treatment well without major side effects except for nausea I plan to repeat imaging study first week of September I will see her back next month to review test results   Nausea without vomiting She will continue antiemetics as needed I refilled her prescription of Zofran   Orders Placed This Encounter  Procedures   CT CHEST ABDOMEN PELVIS W CONTRAST    Standing Status:   Future     Expected Date:   07/25/2024    Expiration Date:   07/07/2025    If indicated for the ordered procedure, I authorize the administration of contrast media per Radiology protocol:   Yes    Does the patient have a contrast media/X-ray dye allergy?:   No    Preferred imaging location?:   [facility]    If indicated for the ordered procedure, I authorize the administration of oral contrast media per Radiology protocol:   No    Reason for no oral contrast::   no need oral contrast    INTERVAL HISTORY: Please see below for problem oriented charting. The purpose of today's discussion is to review toxicity She tolerated pazopanib  well except for occasional nausea Denies diarrhea Energy level is fair She denies recent chest pain or shortness of breath We discussed timing of next imaging and future follow-up I refilled her prescription of Zofran  for nausea  SUMMARY OF ONCOLOGIC HISTORY: Oncology History Overview Note  High grade and LMS, 50% ER/PR positive dMMR normal, PD-L1 CPS 3% CARIS: ER60%, PR50%, PDL-1 5%, p53 mutated, BRAF mutation not detected, SI stable, Low TMB, BRCA neg, Her2/neu neg, RET/NTRK not detected, ESR1 not detected Failed doxorubicin , progressed on gemcitabine  & taxotere , letrozole, Lupron  with tamoxifen  and trabectedin    Uterine sarcoma (HCC)  06/02/2021 Imaging   MRI pelvis Heterogeneous 9.0 cm intrauterine mass within the intramural/submucosal anterior fundus demonstrating interval growth, internal enhancement, and restricted diffusion 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 No extra uterine extension. No evidence of metastatic disease within the pelvis.   Mild thickening of the endometrial stripe, likely related to entrapment by the adjacent uterine mass  06/03/2021 Initial Diagnosis   Uterine sarcoma (HCC)   06/03/2021 Cancer Staging   Staging form: Corpus Uteri - Sarcoma, AJCC 7th Edition - Clinical stage from 06/03/2021:  FIGO Stage IVB (rT1b, N0, M1) - Signed by Lonn Hicks, MD on 03/31/2022 Diagnostic confirmation: Positive histology Stage prefix: Recurrence Biopsy of metastatic site performed: No Lymph-vascular invasion (LVI): LVI present/identified, NOS   06/03/2021 Pathology Results   FINAL MICROSCOPIC DIAGNOSIS:   A. UTERUS, CERVIX, BILATERAL FALLOPIAN TUBES AND OVARIES, HYSTERECTOMY:  - Uterus:       Mixed high grade uterine sarcoma, spanning 6 cm, see comment.       Extensive lymphovascular invasion.       See oncology table.  - Cervix: Benign squamous and endocervical mucosa. No dysplasia or  malignancy.  - Bilateral ovaries: Unremarkable. No malignancy.  - Bilateral fallopian tubes: Unremarkable. No malignancy.   ONCOLOGY TABLE:   UTERUS, SARCOMA: Resection   Procedure: Total hysterectomy and bilateral salpingo-oophorectomy  Specimen Integrity: Focally disrupted on posterior surface  Tumor Site: Anterior wall  Tumor Size: 6 cm  Histologic Type: High grade sarcoma, mixed. See comment.  Other Tissue/ Organ Involvement: Not identified  Lymphovascular Invasion: Present, extensive.  Margins: All margins negative for tumor  Regional Lymph Nodes: Not applicable (no lymph nodes submitted or found)  Distant Metastasis:       Distant Site(s) Involved: Not applicable  Pathologic Stage Classification (pTNM, AJCC 8th Edition): pT1b, pN not  assigned  Ancillary Studies: Can be performed upon request  Representative Tumor Block: A6  Comment(s): The tumor has two morphologically different components. One component consists of malignant spindle cells which can be seen arising from the smooth muscle. These foci are positive for SMA, desmin, cyclinD1 (focal), and CD10 (patchy). The other component consists of round to slightly spindle cells with abundant admixed vessels and occasional large pleomorphic cells. These areas are positive for SMA (patchy weak), desmin (focal), CD10 (variable with diffuse areas).  Pancytokeratin is negative. 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   ADDENDUM:   PROGNOSTIC INDICATOR RESULTS:   Immunohistochemical and morphometric analysis performed manually    Estrogen Receptor:       POSITIVE, 50%, WEAK TO MODERATE STAINING  Progesterone Receptor:   POSITIVE, 50%, MODERATE TO STRONG STAINING   Reference Range Estrogen and Progesterone Receptor       Negative  0%       Positive  >1%    06/03/2021 Surgery   Surgeon: Eloy Maurilio Fitch    Assistants: Dr Olam Mill (an MD assistant was necessary for tissue manipulation, management of robotic instrumentation, retraction and positioning due to the complexity of the case and hospital policies).  Operation: Robotic-assisted laparoscopic total hysterectomy >250gm with bilateral salpingoophorectomy, minilaparotomy for specimen delivery   Surgeon: Eloy Maurilio Fitch    Operative Findings:  : Bulky 16cm uterus with intra-uterine mass (consistent with a fibroid-like mass) , smooth normal appearing serosa, no suspicious bulky nodes. Normal ovaries bilaterally. Normal upper abdomen. Uterus too large to deliver vaginally in tact.    06/18/2021 Imaging   1. Multiple bilateral pulmonary nodules, measuring up to 9 mm. Most of these are perifissural and subpleural in location, likely lymph nodes. Nevertheless, close follow-up recommended to exclude metastatic disease. 2. 11 mm hypoattenuating lesion towards the dome of the liver, indeterminate. MRI abdomen with and without contrast could be used to further evaluate as clinically warranted. 3. Aortic Atherosclerosis (ICD10-I70.0).   10/13/2021  Imaging   1. No acute findings within the abdomen or pelvis. No specific findings identified to suggest residual or recurrence of tumor. 2. Stable small pulmonary nodules within the left lower lobe.   03/18/2022 Imaging   1. Interval development of 2 soft  tissue nodules in the anterior pelvis measuring 10 mm and 7 mm respectively. Close follow-up recommended to exclude metastatic disease. PET-CT may be warranted to further evaluate. 2. Development of 2 mm left upper lobe parenchymal nodule, nonspecific. Close attention on follow-up imaging recommended  3. Stable appearance of index bilateral pulmonary nodules identified previously. 4. Aortic Atherosclerosis (ICD10-I70.0).   04/02/2022 PET scan   1. Signs of local tumor recurrence noted at the vaginal cuff. 2. 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 New since 10/13/2021 and progressive when compared with 03/17/2022. 3. New focal area of increased uptake within the right biceps femoris muscle which is suspicious for skeletal muscle involvement. 4. Stable appearance of small pulmonary nodules described on 03/17/2022. These are too small to characterize by PET-CT.   04/09/2022 Procedure   Successful placement of a right IJ approach Power Port with ultrasound and fluoroscopic guidance. The catheter is ready for use.   04/09/2022 Echocardiogram    1. Left ventricular ejection fraction, by estimation, is 65 to 70%. The left ventricle has normal function. The left ventricle has no regional wall motion abnormalities. There is mild concentric left ventricular hypertrophy. Left ventricular diastolic parameters are consistent with Grade I diastolic dysfunction (impaired relaxation).  2. Right ventricular systolic function is normal. The right ventricular size is normal.  3. The mitral valve is normal in structure. No evidence of mitral valve regurgitation.  4. The aortic valve is tricuspid. Aortic valve regurgitation is not visualized. No aortic stenosis is present.  5. The inferior vena cava is normal in size with greater than 50% respiratory variability, suggesting right atrial pressure of 3 mmHg.   04/16/2022 - 05/28/2022 Chemotherapy   Patient is on Treatment Plan :  UTERINE LEIOMYOSARCOMA Doxorubicin  q21d x 6 Cycles     06/10/2022 Imaging   1. Status post hysterectomy and bilateral oophorectomy. Multiple newand enlarged peritoneal nodules throughout the low abdomen and pelvis, previously FDG avid and consistent with worsened locally recurrent and peritoneal metastatic disease. 2. Multiple small bilateral pulmonary nodules, several of which are slightly enlarged compared to prior examination, consistent with worsened pulmonary metastatic disease.   Aortic Atherosclerosis (ICD10-I70.0).     06/25/2022 - 03/04/2023 Chemotherapy   Patient is on Treatment Plan : UTERINE UNDIFFERENTIATED LEIOMYOSARCOMA Gemcitabine  D1,8 + Docetaxel  D8 (900/100) q21d     09/02/2022 Imaging   1. Moderate response to therapy of peritoneal metastasis within the anterior pelvis. 2. Minimal response to therapy of pulmonary metastasis. 3. No new or progressive disease. 4.  Aortic Atherosclerosis (ICD10-I70.0).     12/01/2022 Imaging   1. No new or progressive findings in the chest, abdomen, or pelvis. 2. Peritoneal metastases in the pelvis described previously measure smaller today compatible with interval response to therapy. 3. Stable tiny bilateral pulmonary nodules. Previously characterized as metastatic lesions, these are unchanged in the interval. No new suspicious pulmonary nodule or mass. 4.  Aortic Atherosclerois (ICD10-170.0)   03/04/2023 Imaging   CT CHEST ABDOMEN PELVIS W CONTRAST  Result Date: 03/03/2023 CLINICAL DATA:  Cervical cancer * Tracking Code: BO *. Assess treatment response. Current chemotherapy. Prior total hysterectomy EXAM: CT CHEST, ABDOMEN, AND PELVIS WITH CONTRAST TECHNIQUE: Multidetector CT imaging of the chest, abdomen and pelvis  was performed following the standard protocol during bolus administration of intravenous contrast. RADIATION DOSE REDUCTION: This exam was performed according to the departmental dose-optimization program which includes automated  exposure control, adjustment of the mA and/or kV according to patient size and/or use of iterative reconstruction technique. CONTRAST:  OMNIPAQUE  IOHEXOL  300 MG/ML  SOLN COMPARISON:  CT 12/01/2022 and older FINDINGS: CT CHEST FINDINGS Cardiovascular: Right upper chest port. Heart is nonenlarged. Trace pericardial fluid. Normal caliber thoracic aorta. Mediastinum/Nodes: No specific abnormal lymph node enlargement seen in the axillary region, hilum or mediastinum. Mildly patulous thoracic esophagus. Thyroid  gland is unremarkable. Lungs/Pleura: Tiny pleural effusions are seen, left-greater-than-right. These new from previous. There is some patchy parenchymal opacity identified in the superior segment of the lower lobes, left-greater-than-right. This could be infiltrative rather than neoplastic. Recommend short follow-up. There were some small nodules identified on the prior. For example 3 mm nodule lateral left lower lobe subpleural on series 7, image 91 is stable. Right lower lobe subpleural nodule on the prior which measured 9 x 6 mm, today is stable on image 73 of series 7. Stable 2-3 mm nodule right lower lobe on series 7, image 97. Musculoskeletal: Curvature of the spine with some degenerative changes. CT ABDOMEN PELVIS FINDINGS Hepatobiliary: Stable dome hepatic cyst. No new space-occupying lesion in the liver. Patent portal vein. Gallbladder is distended. Pancreas: Unremarkable. No pancreatic ductal dilatation or surrounding inflammatory changes. Spleen: Spleen is slightly enlarged at 13.1 cm in AP length. Preserved enhancement. Small splenule inferiorly. Adrenals/Urinary Tract: The adrenal glands are preserved. Tiny low-attenuation cystic lesions are seen which are too small to completely characterize along the kidneys. Bosniak 2 lesions. No specific imaging follow-up. The ureters have normal course and caliber down to the bladder. Bladder wall is diffusely thickened. The bladder is contracted. There is  significant stranding. This is progressive from the previous examination. Stomach/Bowel: Large bowel has a normal course and caliber with scattered stool. Overall moderate stool burden. Normal appendix extends inferior to the cecum in the right lower quadrant. The stomach and small bowel are nondilated. Vascular/Lymphatic: Normal caliber aorta and IVC with some vascular calcifications. No specific abnormal lymph node enlargement identified in the abdomen and pelvis. Reproductive: Absence of the uterus and ovaries. Other: Once again there is a pelvic soft tissue nodule anteriorly on series 2, image 104 which on the prior measured 2.1 x 1.6 cm and today 1.9 x 1.3 cm. Smaller foci more caudal on axial image 111 and 112 are stable. Musculoskeletal: Curvature and degenerative changes are seen along the spine. Degenerative changes along the pelvis. IMPRESSION: Stable pelvic peritoneal nodules. No new lymph node enlargement seen in the chest, abdomen or pelvis. New tiny pleural effusions, left-greater-than-right with some ill-defined parenchymal opacities along the lower lobes. Although there is a differential this could be infectious or inflammatory. Recommend short follow-up and correlation to specific symptoms. Previous tiny lung nodules are stable. Continued follow-up surveillance as per the patient's neoplasm. Mild splenomegaly. Worsening bladder wall thickening and stranding. Please correlate with symptoms and treatment Electronically Signed   By: Ranell Bring M.D.   On: 03/03/2023 18:23      04/05/2023 Imaging   Normal bone density scan   06/21/2023 Imaging   CT CHEST ABDOMEN PELVIS W CONTRAST  Result Date: 06/20/2023 CLINICAL DATA:  Endometrial cancer restaging, chemotherapy complete, ongoing oral chemotherapy * Tracking Code: BO * EXAM: CT CHEST, ABDOMEN, AND PELVIS WITH CONTRAST TECHNIQUE: Multidetector CT imaging of the chest, abdomen and pelvis was performed  following the standard protocol during bolus  administration of intravenous contrast. RADIATION DOSE REDUCTION: This exam was performed according to the departmental dose-optimization program which includes automated exposure control, adjustment of the mA and/or kV according to patient size and/or use of iterative reconstruction technique. CONTRAST:  OMNIPAQUE  IOHEXOL  300 MG/ML  SOLN COMPARISON:  CT chest angiogram, 03/23/2023, CT chest abdomen pelvis, 03/02/2023 FINDINGS: CT CHEST FINDINGS Cardiovascular: No significant vascular findings. Normal heart size. No pericardial effusion. Mediastinum/Nodes: No enlarged mediastinal, hilar, or axillary lymph nodes. Thyroid  gland, trachea, and esophagus demonstrate no significant findings. Lungs/Pleura: Interval resolution of previously seen pleural effusions. Significant interval improvement of previously seen heterogeneous and ground-glass airspace opacity, with small residual opacities present in the lower lobes (series 4, image 68). Multiple small subpleural nodules are unchanged, largest in the dependent right lower lobe measuring 0.9 cm (series 4, image 75). Musculoskeletal: No chest wall abnormality. No acute osseous findings. CT ABDOMEN PELVIS FINDINGS Hepatobiliary: No solid liver abnormality is seen. Benign simple cyst of the liver dome, requiring no further follow-up or characterization (series 2, image 45). No gallstones, gallbladder wall thickening, or biliary dilatation. Pancreas: Unremarkable. No pancreatic ductal dilatation or surrounding inflammatory changes. Spleen: Normal in size without significant abnormality. Adrenals/Urinary Tract: Adrenal glands are unremarkable. Simple, benign right renal cortical cysts, for which no further follow-up or characterization is required. Kidneys are otherwise normal, without renal calculi, solid lesion, or hydronephrosis. Bladder is unremarkable. Stomach/Bowel: Stomach is within normal limits. Appendix appears normal. No evidence of bowel wall thickening,  distention, or inflammatory changes. Moderate burden of stool throughout the colon and rectum. Vascular/Lymphatic: No significant vascular findings are present. No enlarged abdominal or pelvic lymph nodes. Reproductive: Status post hysterectomy. Other: No abdominal wall hernia or abnormality. No ascites. Largest soft tissue nodule in the low midline abdomen is increased in size, measuring 2.8 x 2.5 cm, previously 2.4 x 2.1 cm (series 2, image 105). Multiple additional soft tissue nodules throughout the pelvis are unchanged, largest in the anterior right pelvis measuring 1.6 x 1.2 cm (series 2, image 112). Additional nodule at the right aspect of the vaginal cuff measures 2.0 x 1.0 cm (series 2, image 112). Musculoskeletal: No acute osseous findings. IMPRESSION: 1. Largest peritoneal soft tissue nodule in the low midline abdomen is increased in size, consistent with worsened peritoneal metastatic disease. Multiple additional soft tissue nodules throughout the pelvis are unchanged. 2. 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 3. Interval resolution of previously seen pleural effusions. 4. Significant interval improvement of previously seen heterogeneous and ground-glass airspace opacity, with small residual opacities present in the lower lobes. Findings are consistent with improved nonspecific infection or inflammation. 5. Status post hysterectomy. Electronically Signed   By: Marolyn JONETTA Jaksch M.D.   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 * Tracking Code: BO * EXAM: CT CHEST, ABDOMEN, AND PELVIS WITH CONTRAST TECHNIQUE: Multidetector CT imaging of the chest, abdomen and pelvis was performed following the standard protocol during bolus administration of intravenous contrast. RADIATION DOSE REDUCTION: This exam was performed according to the departmental  dose-optimization program which includes automated exposure control, adjustment of the mA and/or kV according to patient size and/or use of iterative reconstruction technique. CONTRAST:  OMNIPAQUE  IOHEXOL  300 MG/ML  SOLN COMPARISON:  Multiple priors including most recent CT June 18, 2023 FINDINGS: CT CHEST FINDINGS Cardiovascular: Aortic atherosclerosis.  Accessed right chest Port-A-Cath with tip at the superior cavoatrial junction. No central pulmonary embolus on this nondedicated study. Normal size heart. No significant pericardial effusion/thickening. Mediastinum/Nodes: No suspicious thyroid  nodule. No pathologically enlarged mediastinal, hilar or axillary lymph nodes. Small hiatal hernia. Lungs/Pleura: Bilateral solid pulmonary nodules, some of which are stable in size others are increased in size. For reference: -left upper lobe pulmonary nodule measuring 6 mm on image 77/2 previously measured 3 mm. -subpleural right lower lobe pulmonary nodule measures 9 mm on image 77/7, unchanged. A few new scattered tiny pulmonary nodules for instance a 3 mm pulmonary nodule in the right lower lobe on image 87/7. Musculoskeletal: No aggressive lytic or blastic lesion of bone. Multilevel degenerative changes spine. CT ABDOMEN PELVIS FINDINGS Hepatobiliary: Stable 9 mm hypodense lesion in the dome of the liver previously characterized as a cyst on MRI July 04, 2021. No suspicious hepatic lesion. Gallbladder is unremarkable. No biliary ductal dilation. Pancreas: No pancreatic ductal dilation or evidence of acute inflammation. Spleen: No splenomegaly. Adrenals/Urinary Tract: Bilateral adrenal glands appear normal. No hydronephrosis. Stomach/Bowel: No radiopaque enteric contrast material was administered. Stomach is unremarkable for degree of distension. Normal appendix. No pathologic dilation of small or large bowel. Vascular/Lymphatic: Aortic atherosclerosis. Normal caliber abdominal aorta. Smooth IVC contours. The  portal, splenic and superior mesenteric veins are patent. No pathologically enlarged abdominal or pelvic lymph nodes. Reproductive: Prior hysterectomy with increased size of the vaginal cuff nodules previously indexed nodule in the right vaginal cuff measures 3.2 cm on image 116/2 previously 2.0 cm. Other: Increased size and number of peritoneal/omental soft tissue implants. For reference: -previously indexed soft tissue nodule in the anterior pelvis now measures 5.7 x 3.6 cm on image 105/2 previously 2.8 x 2.5 cm. -new pelvic soft tissue nodule measures 2.3 cm on image 106/2. Trace free fluid in the pelvis with a new walled-off pelvic fluid collection measuring 3.9 cm adjacent to a soft tissue implant on image 105/2. Musculoskeletal: No aggressive lytic or blastic lesion of bone. IMPRESSION: 1. Worsening peritoneal carcinomatosis, increased size of the vaginal cuff nodules, and increased size/of bilateral solid pulmonary nodules, consistent with worsening metastatic disease. 2. Aortic Atherosclerosis (ICD10-I70.0). Electronically Signed   By: Reyes Holder M.D.   On: 09/19/2023 12:38      10/05/2023 - 12/30/2023 Chemotherapy   Patient is on Treatment Plan : UTERINE UNDIFFERENTIATED LEIOMYOSARCOMA Gemcitabine  D1,8 + Docetaxel  D8 (900/100) q21d     01/10/2024 Imaging   CT CHEST ABDOMEN PELVIS W CONTRAST Result Date: 01/13/2024 CLINICAL DATA:  Musculoskeletal neoplasm, assess treatment response uterine sarcoma assess response to chemo. * Tracking Code: BO * EXAM: CT CHEST, ABDOMEN, AND PELVIS WITH CONTRAST TECHNIQUE: Multidetector CT imaging of the chest, abdomen and pelvis was performed following the standard protocol during bolus administration of intravenous contrast. RADIATION DOSE REDUCTION: This exam was performed according to the departmental dose-optimization program which includes automated exposure control, adjustment of the mA and/or kV according to patient size and/or use of iterative reconstruction  technique. CONTRAST:  100mL ISOVUE -300 IOPAMIDOL  (ISOVUE -300) INJECTION 61% COMPARISON:  CT scan chest, abdomen and pelvis from 09/17/2023. FINDINGS: CT CHEST FINDINGS Cardiovascular: Normal cardiac size. No pericardial effusion. No aortic aneurysm. Mediastinum/Nodes: Visualized thyroid  gland appears grossly unremarkable. No solid / cystic mediastinal masses. The esophagus is nondistended precluding optimal assessment. No axillary, mediastinal or hilar lymphadenopathy by size criteria. Lungs/Pleura: The central tracheo-bronchial tree is patent. Redemonstration of multiple (more than 20), solid bilateral lung nodules with largest in the right lung lower  lobe measuring 8.2 x 9.5 mm (previously 6.7 x 7.5 mm) and largest in the left lung inferior lingula measuring 6.9 x 7.7 mm (previously 5.2 x 5.8 mm). No new mass or consolidation. No pleural effusion or pneumothorax. Musculoskeletal: A CT Port-a-Cath is seen in the right upper chest wall with the catheter terminating in the lower portion of superior vena cava. Visualized soft tissues of the chest wall are otherwise grossly unremarkable. No suspicious osseous lesions. There are mild multilevel degenerative changes in the visualized spine. CT ABDOMEN PELVIS FINDINGS Hepatobiliary: The liver is normal in size. Non-cirrhotic configuration. There is stable cyst in the right hepatic lobe. Mild diffuse hepatic steatosis. No suspicious liver lesion. No intrahepatic or extrahepatic bile duct dilation. No calcified gallstones. Normal gallbladder wall thickness. No pericholecystic inflammatory changes. Pancreas: Unremarkable. No pancreatic ductal dilatation or surrounding inflammatory changes. Spleen: Within normal limits. No focal lesion. Adrenals/Urinary Tract: Adrenal glands are unremarkable. No suspicious renal mass. There are stable subcentimeter partially exophytic simple cyst in the right kidney lower pole, medially. No nephroureterolithiasis or obstructive uropathy.  Urinary bladder is under distended, precluding optimal assessment. However, no large mass or stones identified. No perivesical fat stranding. Redemonstration of prominent perivesical vessels, similar to the prior study. Stomach/Bowel: No disproportionate dilation of the small or large bowel loops. No evidence of abnormal bowel wall thickening or inflammatory changes. The appendix is unremarkable. Vascular/Lymphatic: No ascites or pneumoperitoneum. Redemonstration of multiple enlarged, heterogeneous, hyperattenuating tumor deposits mainly in the pelvis. There is significant interval increase in the size and enhancement when compared to the prior exam. Largest such deposit in the lower pelvis, anteriorly measures 6.6 x 7.9 cm on today's exam which measured up to 3.6 x 5.7 cm on the prior exam. No abdominal or pelvic lymphadenopathy, by size criteria. No aneurysmal dilation of the major abdominal arteries. There are mild peripheral atherosclerotic vascular calcifications of the aorta and its major branches. Reproductive: The uterus is surgically absent. There are multiple tumor deposits surrounding the vaginal cuff and in the pelvis, as described above. Other: The visualized soft tissues and abdominal wall are unremarkable. Musculoskeletal: No suspicious osseous lesions. There are mild multilevel degenerative changes in the visualized spine. IMPRESSION: 1. Overall, worsening metastatic disease. 2. Interval increase in the size of multiple bilateral solid lung nodules, as described in detail above. 3. Redemonstration of multiple enlarged, heterogeneous, hyperattenuating tumor deposits mainly in the pelvis. There is significant interval increase in the size and enhancement when compared to the prior exam. 4. No new metastatic disease identified within the chest, abdomen or pelvis. 5. Multiple other nonacute observations, as described above. Aortic Atherosclerosis (ICD10-I70.0). Electronically Signed   By: Ree Molt  M.D.   On: 01/13/2024 10:41      01/26/2024 Echocardiogram     1. Left ventricular ejection fraction, by estimation, is 60 to 65%. The left ventricle has normal function. The left ventricle has no regional wall motion abnormalities. There is mild left ventricular hypertrophy. Left ventricular diastolic parameters  are consistent with Grade I diastolic dysfunction (impaired relaxation).  2. Right ventricular systolic function is normal. The right ventricular size is normal. Tricuspid regurgitation signal is inadequate for assessing PA pressure.  3. The mitral valve is normal in structure. No evidence of mitral valve regurgitation. No evidence of mitral stenosis.  4. The aortic valve is tricuspid. There is mild calcification of the aortic valve. There is mild thickening of the aortic valve. Aortic valve regurgitation is not visualized. No aortic stenosis is present.  5. The inferior vena cava is normal in size with greater than 50% respiratory variability, suggesting right atrial pressure of 3 mmHg.   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 Increased complex enhancing soft tissue nodularity in the low abdomen and pelvis, consistent with worsening disease. 2. Increased size of the bilateral pulmonary nodules with a new 3 mm right upper lobe pulmonary nodule, consistent with worsening pulmonary metastatic disease. 3. Increased size of the retroperitoneal, mesenteric and iliac side chain lymph nodes, consistent with worsening nodal metastatic disease. 4. Small hiatal hernia with a patulous esophagus.     04/19/2024 -  Chemotherapy   She started taking pazopanib     I discussed the assessment and treatment plan with the patient. The patient was provided an opportunity to ask questions and all were answered. The patient agreed with the plan and demonstrated an understanding of the instructions. The patient  was advised to call back or seek an in-person evaluation if the symptoms worsen or if the condition fails to improve as anticipated.    I spent 30 minutes for the appointment reviewing test results, discuss management and coordination of care.  Almarie Bedford, MD 07/07/2024 10:39 AM

## 2024-07-07 NOTE — Assessment & Plan Note (Signed)
 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%, PDL-1 5%, p53 mutated, BRAF mutation not detected, SI stable, Low TMB, BRCA neg, Her2/neu neg, RET/NTRK not detected, ESR1 not detected Failed doxorubicin , progressed on gemcitabine  & taxotere , letrozole, Lupron  with tamoxifen  and trabectedin   Her insurance did not approve the use of pembrolizumab despite PD-L1 being 5% positive After long discussion, we decided to put her on treatment with pazopanib  which she started taking on June 4th She tolerated treatment well without major side effects except for nausea I plan to repeat imaging study first week of September I will see her back next month to review test results

## 2024-07-10 ENCOUNTER — Other Ambulatory Visit (HOSPITAL_COMMUNITY): Payer: Self-pay

## 2024-07-25 ENCOUNTER — Ambulatory Visit
Admission: RE | Admit: 2024-07-25 | Discharge: 2024-07-25 | Disposition: A | Discharge status: Home / Self Care | Source: Ambulatory Visit | Attending: Hematology and Oncology | Admitting: Hematology and Oncology

## 2024-07-25 ENCOUNTER — Telehealth: Payer: Self-pay | Admitting: Oncology

## 2024-07-25 DIAGNOSIS — C549 Malignant neoplasm of corpus uteri, unspecified: Secondary | ICD-10-CM

## 2024-07-25 DIAGNOSIS — R110 Nausea: Secondary | ICD-10-CM

## 2024-07-25 MED ORDER — HEPARIN SOD (PORK) LOCK FLUSH 100 UNIT/ML IV SOLN
500.0000 [IU] | Freq: Once | INTRAVENOUS | Status: AC
  Administered 2024-07-25: 500 [IU] via INTRAVENOUS

## 2024-07-25 MED ORDER — SODIUM CHLORIDE 0.9% FLUSH
10.0000 mL | INTRAVENOUS | Status: DC | PRN
  Administered 2024-07-25: 10 mL via INTRAVENOUS

## 2024-07-25 MED ORDER — IOPAMIDOL (ISOVUE-370) INJECTION 76%
79.0000 mL | Freq: Once | INTRAVENOUS | Status: AC | PRN
  Administered 2024-07-25: 79 mL via INTRAVENOUS

## 2024-07-25 NOTE — Telephone Encounter (Signed)
 Called [name] and advised that Dr. Lonn would like to see her to discuss her CT results on Thursday morning or Friday morning.  Apt scheduled on Thursday at 10:20 per [name]'s request.

## 2024-07-27 ENCOUNTER — Telehealth: Payer: Self-pay

## 2024-07-27 ENCOUNTER — Encounter: Payer: Self-pay | Admitting: Oncology

## 2024-07-27 ENCOUNTER — Encounter: Payer: Self-pay | Admitting: Hematology and Oncology

## 2024-07-27 ENCOUNTER — Inpatient Hospital Stay: Attending: Gynecologic Oncology | Admitting: Hematology and Oncology

## 2024-07-27 VITALS — BP 157/92 | HR 97 | Temp 97.6°F | Resp 18 | Ht 67.0 in | Wt 194.2 lb

## 2024-07-27 DIAGNOSIS — C775 Secondary and unspecified malignant neoplasm of intrapelvic lymph nodes: Secondary | ICD-10-CM | POA: Insufficient documentation

## 2024-07-27 DIAGNOSIS — C786 Secondary malignant neoplasm of retroperitoneum and peritoneum: Secondary | ICD-10-CM | POA: Diagnosis not present

## 2024-07-27 DIAGNOSIS — C55 Malignant neoplasm of uterus, part unspecified: Secondary | ICD-10-CM | POA: Insufficient documentation

## 2024-07-27 DIAGNOSIS — C7801 Secondary malignant neoplasm of right lung: Secondary | ICD-10-CM | POA: Diagnosis not present

## 2024-07-27 DIAGNOSIS — C549 Malignant neoplasm of corpus uteri, unspecified: Secondary | ICD-10-CM

## 2024-07-27 DIAGNOSIS — Z7189 Other specified counseling: Secondary | ICD-10-CM

## 2024-07-27 DIAGNOSIS — C7802 Secondary malignant neoplasm of left lung: Secondary | ICD-10-CM | POA: Insufficient documentation

## 2024-07-27 NOTE — Assessment & Plan Note (Addendum)
 We have numerous goals of care discussions in the past From our previous discussion earlier this year, the patient made informed decision for CODE STATUS to be DNR On further review of CODE STATUS today, the patient would like to be full code and only to be in DNR status if she has terminal illness to the point of no return Per patient request, I have rescinded previous DNR status order

## 2024-07-27 NOTE — Telephone Encounter (Signed)
 Oral Oncology Patient Advocate Encounter Prescription refills have been canceled with [facility] for  Votrient  (pazopanib ) as Dr. Lonn discontinued medication today.

## 2024-07-27 NOTE — Progress Notes (Signed)
 Called and faxed referral to the [facility] (sarcoma clinic).

## 2024-07-27 NOTE — Progress Notes (Signed)
  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%, PDL-1 5%, p53 mutated, BRAF mutation not detected, SI stable, Low TMB, BRCA neg, Her2/neu neg, RET/NTRK not detected, ESR1 not detected Failed doxorubicin , progressed on gemcitabine  & taxotere , letrozole, Lupron  with tamoxifen  and trabectedin  and pazopanib   Her insurance did not approve the use of pembrolizumab despite PD-L1 being 5% positive I reviewed recent imaging study with the patient and her husband Unfortunately, she has disease progression We would discontinue pazopanib  Overall, the patient has progressed on multiple different agents At this point in time, I recommend the patient to consider referral to tertiary center for second opinion I do not believe other options such as the dacarbazine, temozolomide or regorafenib will be helpful In fact, or other lines of chemotherapy would likely yield less than 10% benefit We also discussed the role of palliative care support without chemotherapy At the end of the day, the patient would like to be referred to [facility] for second opinion to the sarcoma clinic I will arrange The patient will call me after her second opinion for final decision Goals of care, counseling/discussion We have numerous goals of care discussions in the past From our previous discussion earlier this year, the patient made informed decision for CODE STATUS to be DNR On further review of CODE STATUS today, the patient would like to be full code and only to be in DNR status if  she has terminal illness to the point of no return Per patient request, I have rescinded previous DNR status order  No orders of the defined types were placed in this encounter.    Hicks Lonn, MD  INTERVAL HISTORY: she returns for surveillance follow-up with her husband She is not symptomatic from her disease Denies recent chest pain, cough or shortness of breath Denies abdominal pain or changes in bowel habits We spent majority of her time reviewing imaging study results and discussed treatment options  PHYSICAL EXAMINATION: ECOG PERFORMANCE STATUS: 0 - Asymptomatic  Vitals:   07/27/24 0954  BP: (!) 157/92  Pulse: 97  Resp: 18  Temp: 97.6 F (36.4 C)  SpO2: 100%   Filed Weights   07/27/24 0954  Weight: 194 lb 3.2 oz (88.1 kg)    Relevant data reviewed during this visit included CBC, CMP, TSH, CT imaging from September 2025 in comparison with prior imaging from May 2025

## 2024-07-27 NOTE — Assessment & Plan Note (Addendum)
 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%, PDL-1 5%, p53 mutated, BRAF mutation not detected, SI stable, Low TMB, BRCA neg, Her2/neu neg, RET/NTRK not detected, ESR1 not detected Failed doxorubicin , progressed on gemcitabine  & taxotere , letrozole, Lupron  with tamoxifen  and trabectedin  and pazopanib   Her insurance did not approve the use of pembrolizumab despite PD-L1 being 5% positive I reviewed recent imaging study with the patient and her husband Unfortunately, she has disease progression We would discontinue pazopanib  Overall, the patient has progressed on multiple different agents At this point in time, I recommend the patient to consider referral to tertiary center for second opinion I do not believe other options such as the dacarbazine, temozolomide or regorafenib will be helpful In fact, or other lines of chemotherapy would likely yield less than 10% benefit We also discussed the role of palliative care support without chemotherapy At the end of the day, the patient would like to be referred to [facility] for second opinion to the sarcoma clinic I will arrange The patient will call me after her second opinion for final decision

## 2024-07-31 ENCOUNTER — Ambulatory Visit: Admitting: Hematology and Oncology

## 2024-08-20 ENCOUNTER — Other Ambulatory Visit: Payer: Self-pay | Admitting: Hematology and Oncology

## 2024-08-21 ENCOUNTER — Encounter: Payer: Self-pay | Admitting: Hematology and Oncology

## 2024-08-22 ENCOUNTER — Telehealth: Payer: Self-pay

## 2024-08-22 NOTE — Telephone Encounter (Signed)
 Called and given below message. She verbalized understanding. She will contact [facility] to get scheduled for treatment.

## 2024-08-22 NOTE — Telephone Encounter (Signed)
 She called and left a message asking if you have reviewed Dr. Loris 10/3 office visit with his recommendations. He told her that she could get the recommended treatment with Dr. Lonn.

## 2024-08-22 NOTE — Telephone Encounter (Signed)
 I reviewed his recommendation We cannot give the treatment here; if she wants treatment he recommends, she needs to be treated at [facility]

## 2024-10-16 ENCOUNTER — Other Ambulatory Visit (HOSPITAL_BASED_OUTPATIENT_CLINIC_OR_DEPARTMENT_OTHER): Payer: Self-pay

## 2024-10-16 MED ORDER — PREDNISOLONE SODIUM PHOSPHATE 15 MG/5ML PO SOLN
10.0000 mL | Freq: Four times a day (QID) | OROMUCOSAL | 0 refills | Status: AC | PRN
  Filled 2024-10-16: qty 180, 5d supply, fill #0

## 2024-11-13 ENCOUNTER — Other Ambulatory Visit: Payer: Self-pay | Admitting: Hematology and Oncology

## 2024-12-21 ENCOUNTER — Other Ambulatory Visit: Payer: Self-pay | Admitting: Hematology and Oncology
